# Patient Record
Sex: Female | Born: 1970 | Race: White | Hispanic: No | Marital: Married | Smoking: Former smoker
Health system: Southern US, Community
[De-identification: ages and names within clinical notes are randomized; demographics above are authoritative.]

## PROBLEM LIST (undated history)

## (undated) DIAGNOSIS — M1990 Unspecified osteoarthritis, unspecified site: Secondary | ICD-10-CM

## (undated) DIAGNOSIS — K449 Diaphragmatic hernia without obstruction or gangrene: Secondary | ICD-10-CM

## (undated) DIAGNOSIS — F411 Generalized anxiety disorder: Secondary | ICD-10-CM

## (undated) DIAGNOSIS — M069 Rheumatoid arthritis, unspecified: Secondary | ICD-10-CM

## (undated) DIAGNOSIS — R52 Pain, unspecified: Secondary | ICD-10-CM

## (undated) DIAGNOSIS — F329 Major depressive disorder, single episode, unspecified: Secondary | ICD-10-CM

## (undated) DIAGNOSIS — M545 Low back pain: Secondary | ICD-10-CM

## (undated) DIAGNOSIS — M542 Cervicalgia: Secondary | ICD-10-CM

## (undated) DIAGNOSIS — K219 Gastro-esophageal reflux disease without esophagitis: Secondary | ICD-10-CM

## (undated) DIAGNOSIS — F32A Depression, unspecified: Secondary | ICD-10-CM

## (undated) DIAGNOSIS — M85852 Other specified disorders of bone density and structure, left thigh: Secondary | ICD-10-CM

## (undated) DIAGNOSIS — G43909 Migraine, unspecified, not intractable, without status migrainosus: Secondary | ICD-10-CM

## (undated) DIAGNOSIS — M791 Myalgia, unspecified site: Secondary | ICD-10-CM

## (undated) DIAGNOSIS — S0500XA Injury of conjunctiva and corneal abrasion without foreign body, unspecified eye, initial encounter: Secondary | ICD-10-CM

## (undated) HISTORY — DX: Migraine, unspecified, not intractable, without status migrainosus: G43.909

## (undated) HISTORY — DX: Diaphragmatic hernia without obstruction or gangrene: K44.9

## (undated) HISTORY — DX: Gastro-esophageal reflux disease without esophagitis: K21.9

## (undated) HISTORY — DX: Major depressive disorder, single episode, unspecified: F32.9

## (undated) HISTORY — DX: Low back pain: M54.5

## (undated) HISTORY — DX: Generalized anxiety disorder: F41.1

## (undated) HISTORY — DX: Unspecified osteoarthritis, unspecified site: M19.90

## (undated) HISTORY — DX: Pain, unspecified: R52

## (undated) HISTORY — DX: Rheumatoid arthritis, unspecified: M06.9

## (undated) HISTORY — DX: Other specified disorders of bone density and structure, left thigh: M85.852

## (undated) HISTORY — DX: Cervicalgia: M54.2

## (undated) HISTORY — DX: Depression, unspecified: F32.A

## (undated) HISTORY — DX: Myalgia, unspecified site: M79.10

## (undated) HISTORY — DX: Injury of conjunctiva and corneal abrasion without foreign body, unspecified eye, initial encounter: S05.00XA

## (undated) HISTORY — PX: ABDOMINAL HYSTERECTOMY: SHX81

## (undated) HISTORY — PX: CHOLECYSTECTOMY: SHX55

---

## 2011-12-15 DIAGNOSIS — M059 Rheumatoid arthritis with rheumatoid factor, unspecified: Secondary | ICD-10-CM | POA: Insufficient documentation

## 2011-12-15 DIAGNOSIS — K219 Gastro-esophageal reflux disease without esophagitis: Secondary | ICD-10-CM

## 2011-12-15 DIAGNOSIS — M542 Cervicalgia: Secondary | ICD-10-CM

## 2011-12-15 DIAGNOSIS — F419 Anxiety disorder, unspecified: Secondary | ICD-10-CM | POA: Insufficient documentation

## 2011-12-15 DIAGNOSIS — IMO0001 Reserved for inherently not codable concepts without codable children: Secondary | ICD-10-CM | POA: Insufficient documentation

## 2011-12-15 DIAGNOSIS — F331 Major depressive disorder, recurrent, moderate: Secondary | ICD-10-CM | POA: Insufficient documentation

## 2011-12-15 DIAGNOSIS — M069 Rheumatoid arthritis, unspecified: Secondary | ICD-10-CM

## 2011-12-15 DIAGNOSIS — E039 Hypothyroidism, unspecified: Secondary | ICD-10-CM | POA: Insufficient documentation

## 2011-12-15 DIAGNOSIS — M791 Myalgia, unspecified site: Secondary | ICD-10-CM

## 2011-12-15 DIAGNOSIS — G43109 Migraine with aura, not intractable, without status migrainosus: Secondary | ICD-10-CM | POA: Insufficient documentation

## 2011-12-15 DIAGNOSIS — M545 Low back pain, unspecified: Secondary | ICD-10-CM

## 2011-12-15 DIAGNOSIS — F411 Generalized anxiety disorder: Secondary | ICD-10-CM

## 2011-12-15 HISTORY — DX: Low back pain, unspecified: M54.50

## 2011-12-15 HISTORY — DX: Rheumatoid arthritis, unspecified: M06.9

## 2011-12-15 HISTORY — DX: Cervicalgia: M54.2

## 2011-12-15 HISTORY — DX: Myalgia, unspecified site: M79.10

## 2011-12-15 HISTORY — DX: Generalized anxiety disorder: F41.1

## 2011-12-15 HISTORY — DX: Gastro-esophageal reflux disease without esophagitis: K21.9

## 2012-12-26 DIAGNOSIS — Z79899 Other long term (current) drug therapy: Secondary | ICD-10-CM | POA: Insufficient documentation

## 2012-12-27 DIAGNOSIS — R52 Pain, unspecified: Secondary | ICD-10-CM

## 2012-12-27 HISTORY — DX: Pain, unspecified: R52

## 2013-12-21 DIAGNOSIS — E039 Hypothyroidism, unspecified: Secondary | ICD-10-CM | POA: Insufficient documentation

## 2014-04-24 DIAGNOSIS — Z975 Presence of (intrauterine) contraceptive device: Secondary | ICD-10-CM | POA: Insufficient documentation

## 2014-12-31 ENCOUNTER — Ambulatory Visit: Payer: Commercial Managed Care - HMO | Attending: Pain Medicine | Admitting: Pain Medicine

## 2014-12-31 ENCOUNTER — Other Ambulatory Visit: Payer: Self-pay | Admitting: Pain Medicine

## 2014-12-31 ENCOUNTER — Encounter: Payer: Self-pay | Admitting: Pain Medicine

## 2014-12-31 ENCOUNTER — Other Ambulatory Visit
Admission: RE | Admit: 2014-12-31 | Discharge: 2014-12-31 | Disposition: A | Discharge status: Home / Self Care | Payer: Commercial Managed Care - HMO | Source: Ambulatory Visit | Attending: Pain Medicine | Admitting: Pain Medicine

## 2014-12-31 VITALS — BP 135/81 | HR 94 | Temp 98.4°F | Resp 18 | Ht 66.0 in | Wt 178.0 lb

## 2014-12-31 DIAGNOSIS — G894 Chronic pain syndrome: Secondary | ICD-10-CM | POA: Insufficient documentation

## 2014-12-31 DIAGNOSIS — M7918 Myalgia, other site: Secondary | ICD-10-CM

## 2014-12-31 DIAGNOSIS — F329 Major depressive disorder, single episode, unspecified: Secondary | ICD-10-CM | POA: Insufficient documentation

## 2014-12-31 DIAGNOSIS — M47816 Spondylosis without myelopathy or radiculopathy, lumbar region: Secondary | ICD-10-CM | POA: Insufficient documentation

## 2014-12-31 DIAGNOSIS — F1190 Opioid use, unspecified, uncomplicated: Secondary | ICD-10-CM | POA: Diagnosis not present

## 2014-12-31 DIAGNOSIS — Z79891 Long term (current) use of opiate analgesic: Secondary | ICD-10-CM | POA: Insufficient documentation

## 2014-12-31 DIAGNOSIS — M545 Low back pain, unspecified: Secondary | ICD-10-CM

## 2014-12-31 DIAGNOSIS — M069 Rheumatoid arthritis, unspecified: Secondary | ICD-10-CM

## 2014-12-31 DIAGNOSIS — Z9884 Bariatric surgery status: Secondary | ICD-10-CM | POA: Diagnosis not present

## 2014-12-31 DIAGNOSIS — M62838 Other muscle spasm: Secondary | ICD-10-CM | POA: Diagnosis not present

## 2014-12-31 DIAGNOSIS — K219 Gastro-esophageal reflux disease without esophagitis: Secondary | ICD-10-CM

## 2014-12-31 DIAGNOSIS — M961 Postlaminectomy syndrome, not elsewhere classified: Secondary | ICD-10-CM | POA: Insufficient documentation

## 2014-12-31 DIAGNOSIS — M5020 Other cervical disc displacement, unspecified cervical region: Secondary | ICD-10-CM | POA: Insufficient documentation

## 2014-12-31 DIAGNOSIS — M47812 Spondylosis without myelopathy or radiculopathy, cervical region: Secondary | ICD-10-CM | POA: Insufficient documentation

## 2014-12-31 DIAGNOSIS — M5136 Other intervertebral disc degeneration, lumbar region: Secondary | ICD-10-CM | POA: Insufficient documentation

## 2014-12-31 DIAGNOSIS — M4726 Other spondylosis with radiculopathy, lumbar region: Secondary | ICD-10-CM | POA: Diagnosis not present

## 2014-12-31 DIAGNOSIS — R51 Headache: Secondary | ICD-10-CM | POA: Insufficient documentation

## 2014-12-31 DIAGNOSIS — M25512 Pain in left shoulder: Secondary | ICD-10-CM

## 2014-12-31 DIAGNOSIS — E039 Hypothyroidism, unspecified: Secondary | ICD-10-CM | POA: Insufficient documentation

## 2014-12-31 DIAGNOSIS — M546 Pain in thoracic spine: Secondary | ICD-10-CM | POA: Insufficient documentation

## 2014-12-31 DIAGNOSIS — G9619 Other disorders of meninges, not elsewhere classified: Secondary | ICD-10-CM | POA: Insufficient documentation

## 2014-12-31 DIAGNOSIS — G8929 Other chronic pain: Secondary | ICD-10-CM | POA: Diagnosis not present

## 2014-12-31 DIAGNOSIS — F32A Depression, unspecified: Secondary | ICD-10-CM

## 2014-12-31 DIAGNOSIS — M5126 Other intervertebral disc displacement, lumbar region: Secondary | ICD-10-CM | POA: Diagnosis not present

## 2014-12-31 DIAGNOSIS — F1120 Opioid dependence, uncomplicated: Secondary | ICD-10-CM

## 2014-12-31 DIAGNOSIS — M549 Dorsalgia, unspecified: Secondary | ICD-10-CM | POA: Insufficient documentation

## 2014-12-31 DIAGNOSIS — M25511 Pain in right shoulder: Secondary | ICD-10-CM

## 2014-12-31 DIAGNOSIS — M5430 Sciatica, unspecified side: Secondary | ICD-10-CM | POA: Diagnosis not present

## 2014-12-31 DIAGNOSIS — Z5181 Encounter for therapeutic drug level monitoring: Secondary | ICD-10-CM | POA: Insufficient documentation

## 2014-12-31 DIAGNOSIS — M4804 Spinal stenosis, thoracic region: Secondary | ICD-10-CM

## 2014-12-31 DIAGNOSIS — M5412 Radiculopathy, cervical region: Secondary | ICD-10-CM

## 2014-12-31 DIAGNOSIS — M25561 Pain in right knee: Secondary | ICD-10-CM

## 2014-12-31 DIAGNOSIS — M25562 Pain in left knee: Secondary | ICD-10-CM

## 2014-12-31 DIAGNOSIS — M5124 Other intervertebral disc displacement, thoracic region: Secondary | ICD-10-CM | POA: Insufficient documentation

## 2014-12-31 DIAGNOSIS — G4486 Cervicogenic headache: Secondary | ICD-10-CM

## 2014-12-31 DIAGNOSIS — F331 Major depressive disorder, recurrent, moderate: Secondary | ICD-10-CM

## 2014-12-31 DIAGNOSIS — M5030 Other cervical disc degeneration, unspecified cervical region: Secondary | ICD-10-CM | POA: Insufficient documentation

## 2014-12-31 DIAGNOSIS — Z8619 Personal history of other infectious and parasitic diseases: Secondary | ICD-10-CM | POA: Insufficient documentation

## 2014-12-31 DIAGNOSIS — Z9889 Other specified postprocedural states: Secondary | ICD-10-CM

## 2014-12-31 DIAGNOSIS — M5441 Lumbago with sciatica, right side: Secondary | ICD-10-CM

## 2014-12-31 DIAGNOSIS — Z8659 Personal history of other mental and behavioral disorders: Secondary | ICD-10-CM | POA: Insufficient documentation

## 2014-12-31 DIAGNOSIS — M542 Cervicalgia: Secondary | ICD-10-CM

## 2014-12-31 DIAGNOSIS — F411 Generalized anxiety disorder: Secondary | ICD-10-CM | POA: Diagnosis not present

## 2014-12-31 DIAGNOSIS — M5416 Radiculopathy, lumbar region: Secondary | ICD-10-CM

## 2014-12-31 DIAGNOSIS — G96198 Other disorders of meninges, not elsewhere classified: Secondary | ICD-10-CM | POA: Insufficient documentation

## 2014-12-31 DIAGNOSIS — M51369 Other intervertebral disc degeneration, lumbar region without mention of lumbar back pain or lower extremity pain: Secondary | ICD-10-CM

## 2014-12-31 DIAGNOSIS — M5442 Lumbago with sciatica, left side: Secondary | ICD-10-CM

## 2014-12-31 DIAGNOSIS — M79643 Pain in unspecified hand: Secondary | ICD-10-CM

## 2014-12-31 DIAGNOSIS — Z9289 Personal history of other medical treatment: Secondary | ICD-10-CM

## 2014-12-31 LAB — URINE DRUG SCREEN, QUALITATIVE (ARMC ONLY)
Amphetamines, Ur Screen: NOT DETECTED
BARBITURATES, UR SCREEN: NOT DETECTED
BENZODIAZEPINE, UR SCRN: POSITIVE — AB
Cannabinoid 50 Ng, Ur ~~LOC~~: NOT DETECTED
Cocaine Metabolite,Ur ~~LOC~~: NOT DETECTED
MDMA (Ecstasy)Ur Screen: NOT DETECTED
Methadone Scn, Ur: NOT DETECTED
OPIATE, UR SCREEN: NOT DETECTED
PHENCYCLIDINE (PCP) UR S: NOT DETECTED
Tricyclic, Ur Screen: POSITIVE — AB

## 2014-12-31 LAB — COMPREHENSIVE METABOLIC PANEL
ALBUMIN: 4.2 g/dL (ref 3.5–5.0)
ALK PHOS: 70 U/L (ref 38–126)
ALT: 44 U/L (ref 14–54)
ANION GAP: 7 (ref 5–15)
AST: 44 U/L — ABNORMAL HIGH (ref 15–41)
BILIRUBIN TOTAL: 0.9 mg/dL (ref 0.3–1.2)
BUN: 8 mg/dL (ref 6–20)
CALCIUM: 8.5 mg/dL — AB (ref 8.9–10.3)
CO2: 28 mmol/L (ref 22–32)
CREATININE: 0.52 mg/dL (ref 0.44–1.00)
Chloride: 105 mmol/L (ref 101–111)
GFR calc non Af Amer: >60 mL/min (ref >60)
GLUCOSE: 88 mg/dL (ref 65–99)
Potassium: 3 mmol/L — ABNORMAL LOW (ref 3.5–5.1)
SODIUM: 140 mmol/L (ref 135–145)
TOTAL PROTEIN: 7.1 g/dL (ref 6.5–8.1)

## 2014-12-31 LAB — C-REACTIVE PROTEIN: CRP: <0.5 mg/dL (ref <1.0)

## 2014-12-31 LAB — SEDIMENTATION RATE: Sed Rate: 5 mm/hr (ref 0–20)

## 2014-12-31 LAB — MAGNESIUM: MAGNESIUM: 2 mg/dL (ref 1.7–2.4)

## 2014-12-31 MED ORDER — OXYCODONE HCL 5 MG PO CAPS: 10.0000 mg | ORAL_CAPSULE | Freq: Four times a day (QID) | ORAL | Status: DC | PRN

## 2014-12-31 MED ORDER — OXYCODONE HCL 5 MG PO CAPS
10.0000 mg | ORAL_CAPSULE | Freq: Four times a day (QID) | ORAL | Status: DC | PRN
Start: 2014-12-31 — End: 2015-03-27

## 2014-12-31 MED ORDER — CYCLOBENZAPRINE HCL 10 MG PO TABS: 10.0000 mg | ORAL_TABLET | Freq: Three times a day (TID) | ORAL | Status: DC | PRN

## 2014-12-31 NOTE — Progress Notes (Signed)
Patient's Name: Holly Spears MRN: [id number] DOB: 1971-01-24 DOS: 12/31/2014  Primary Reason(s) for Visit: Encounter for Medication Management CC: Back Pain   HPI:   Holly Spears is a 44 y.o. year old, female patient, who returns today as an established patient. She has Chronic pain; Long term current use of opiate analgesic; Long term prescription opiate use; Opiate use; Encounter for therapeutic drug level monitoring; Opiate dependence (HCC); Encounter for chronic pain management; Muscle spasm; Myofascial pain; Acquired hypothyroidism; Adult hypothyroidism; Intracervical pessary; Moderate episode of recurrent major depressive disorder (HCC); Acute onset aura migraine; Rheumatoid arthritis (HCC); Musculoskeletal pain; Chronic pain syndrome; Chronic neck pain; Cervical spondylosis (C5-6 and C6-7); Cervical disc herniation (Left C6-7); Bulge of cervical disc without myelopathy (C5-6); Cervicogenic headache; Chronic cervical radicular pain (Bilateral) (R>L); Chronic pain of both shoulders (Bilateral) (R>L); Chronic low back pain; Failed back surgical syndrome; 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; Lumbar spondylosis (L3-4, L4-5, and L5-S1); Lumbar bulging disc (L3-4, L4-5, and L5-S1); Chronic lumbar radicular pain (Bilateral); Chronic knee pain (Bilateral); Chronic left-sided thoracic back pain; Thoracic disc herniation (Large, Left paracentral T10-11 disc herniation); Thoracic spinal stenosis (Severe, Left, T10-11 Lateral Recess Stenosis); Thoracic foraminal stenosis (Severe Left T10-11); History of bilateral carpal tunnel release (Bilateral); Chronic hand pain (Bilateral) (R>L); GERD (gastroesophageal reflux disease); History of psychiatric disorder; History of psychiatric care; Generalized anxiety disorder; Depression; History of shingles; History of gastric bypass; and History of Roux-en-Y gastric bypass on her problem list.. Her primarily concern today is the Back Pain     The patient returns to the clinic today  for medication management. We talked about using one 10 mg oxycodone instead of two 5 mg pills. She indicates that she try that before and for some reason the generic 10 mg pill does not work as well as to 5 mg generic pills. I thought about switching her to the brand name 10 mg pill, but she indicates that her insurance will not pay for it. Because of this will stay as is. One of the problems that she has has to do with her gastric bypass.  The patient describes her primary pain as being that of the lower back with the right side being worst on the left. Following this is the bilateral knee pain with the left being worse than the right. Finally she describes pain that goes down to her heel on the right side. She indicates that this last one was secondary to a nerve root block done at [facility] by a "trainee". She indicates that she experience a severe pain as he was injecting, which the patient had never experienced before with any of the other injections she had done.  Today's Pain Score: 6 , clinically she looks like a 2-3/10. Reported level of pain is incompatible with clinical obrservations. This may be secondary to a possible lack of understanding on how the pain scale works. Pain Type: Chronic pain Pain Location: Back Pain Orientation: Mid Pain Descriptors / Indicators: Constant, Aching Pain Frequency: Constant  Date of Last Visit: 09/11/14 Service Provided on Last Visit: Evaluation, Med Refill  Pharmacotherapy Review:   Side-effects or Adverse reactions: None reported. Effectiveness: Described as relatively effective, allowing for increase in activities of daily living (ADL). Onset of action: Within expected pharmacological parameters. Duration of action: Within normal limits for medication. Peak effect: Timing and results are as within normal expected parameters. [place] PMP: Compliant with practice rules and regulations. UDS Results:  Lab Results  Component Value Date   THCU NONE DETECTED  12/31/2014   PCPSCRNUR NONE DETECTED 12/31/2014   MDMA NONE DETECTED 12/31/2014   AMPHETMU NONE DETECTED 12/31/2014   METHADONE NONE DETECTED 12/31/2014    UDS Interpretation: Patient appears to be compliant with practice rules and regulations. Medication Assessment Form: Reviewed. Patient indicates being compliant with therapy Treatment compliance: Compliant. Substance Use Disorder (SUD) Risk Level: Low Pharmacologic Plan: Continue therapy as is.  Allergies: Holly Spears is allergic to bee venom; honey bee venom; penicillins; shellfish allergy; shellfish-derived products; sulfa antibiotics; erythromycin; sulfamethizole; zaleplon; codeine; erythromycin base; and trazodone.  Meds: The patient has a current medication list which includes the following prescription(s): bupropion, buspirone, citalopram, cyclobenzaprine, diazepam, hydroxychloroquine, nature-throid, oxycodone, oxycodone, oxycodone, and potassium chloride sa. Requested Prescriptions   Signed Prescriptions Disp Refills  . cyclobenzaprine (FLEXERIL) 10 MG tablet 90 tablet 2    Sig: Take 1 tablet (10 mg total) by mouth 3 (three) times daily as needed for muscle spasms.  Marland Kitchen oxycodone (OXY-IR) 5 MG capsule 240 capsule 0    Sig: Take 2 capsules (10 mg total) by mouth every 6 (six) hours as needed for pain.  Marland Kitchen oxycodone (OXY-IR) 5 MG capsule 240 capsule 0    Sig: Take 2 capsules (10 mg total) by mouth every 6 (six) hours as needed for pain.  Marland Kitchen oxycodone (OXY-IR) 5 MG capsule 240 capsule 0    Sig: Take 2 capsules (10 mg total) by mouth every 6 (six) hours as needed for pain.    ROS: Constitutional: Afebrile, no chills, well hydrated and well nourished Gastrointestinal: negative Musculoskeletal:negative Neurological: negative Behavioral/Psych: negative  PFSH: Medical:  Ms. Halberstam  has a past medical history of Depression; GERD (gastroesophageal reflux disease); Arthritis; Migraine; Hiatal hernia; Pain (12/27/2012); Rheumatoid  arthritis (HCC) (12/15/2011); Muscle ache (12/15/2011); Cervical pain (12/15/2011); LBP (low back pain) (12/15/2011); Acid reflux (12/15/2011); and Anxiety, generalized (12/15/2011). Family: family history includes Depression in her mother; Diabetes in her father; Hyperlipidemia in her mother; Hypertension in her father and mother. Surgical:  has past surgical history that includes Cholecystectomy. Tobacco:  reports that she has quit smoking. She has never used smokeless tobacco. Alcohol:  reports that she does not drink alcohol. Drug:  reports that she does not use illicit drugs.  Physical Exam: Vitals:  Today's Vitals   12/31/14 1109  BP: 135/81  Pulse: 94  Temp: 98.4 F (36.9 C)  TempSrc: Oral  Resp: 18  Height: 5\' 6"  (1.676 m)  Weight: 178 lb (80.74 kg)  SpO2: 100%  PainSc: 6   PainLoc: Back  Calculated BMI: Body mass index is 28.74 kg/(m^2). General appearance: alert, cooperative, appears stated age, no distress and slowed mentation Eyes: conjunctivae/corneas clear. PERRL, EOM's intact. Fundi benign. Lungs: No evidence respiratory distress, no audible rales or ronchi and no use of accessory muscles of respiration Neck: Decreased range of motion of the cervical spine. Back: Some decreased range of motion of the lumbar spine with discomfort and pain on hyperextension and rotation. Extremities: extremities normal, atraumatic, no cyanosis or edema Pulses: 2+ and symmetric Skin: Skin color, texture, turgor normal. No rashes or lesions Neurologic: Gait: Antalgic    Assessment: Encounter Diagnosis:  Primary Diagnosis: Chronic pain [G89.29]  Plan: Sherryll was seen today for back pain.  Diagnoses and all orders for this visit:  Chronic pain -     COMPLETE METABOLIC PANEL WITH GFR; Future -     C-reactive protein; Future -     Magnesium; Future -     Sedimentation rate; Future -  Vitamin D2,D3 Panel; Future -     oxycodone (OXY-IR) 5 MG capsule; Take 2 capsules (10 mg  total) by mouth every 6 (six) hours as needed for pain. -     oxycodone (OXY-IR) 5 MG capsule; Take 2 capsules (10 mg total) by mouth every 6 (six) hours as needed for pain. -     oxycodone (OXY-IR) 5 MG capsule; Take 2 capsules (10 mg total) by mouth every 6 (six) hours as needed for pain.  Long term current use of opiate analgesic -     Drugs of abuse screen w/o alc, rtn urine-sln; Future -     Drugs of abuse screen w/o alc, rtn urine-sln  Long term prescription opiate use  Opiate use  Encounter for therapeutic drug level monitoring  Uncomplicated opioid dependence (HCC)  Encounter for chronic pain management  Muscle spasm -     cyclobenzaprine (FLEXERIL) 10 MG tablet; Take 1 tablet (10 mg total) by mouth 3 (three) times daily as needed for muscle spasms.  Myofascial pain -     cyclobenzaprine (FLEXERIL) 10 MG tablet; Take 1 tablet (10 mg total) by mouth 3 (three) times daily as needed for muscle spasms.  Cervical pain  Low back pain, unspecified back pain laterality, with sciatica presence unspecified  Rheumatoid arthritis, involving unspecified site, unspecified rheumatoid factor presence (HCC)  Musculoskeletal pain  Chronic pain syndrome  Chronic neck pain  Cervical spondylosis  Cervical disc herniation (Left C6-7)  Bulge of cervical disc without myelopathy (C5-6)  Cervicogenic headache  Chronic cervical radicular pain (Bilateral) (R>L)  Chronic pain of both shoulders (Bilateral) (R>L)  Chronic low back pain  Failed back surgical syndrome  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  Osteoarthritis of spine with radiculopathy, lumbar region  Lumbar bulging disc (L3-4, L4-5, and L5-S1)  Chronic lumbar radicular pain (Bilateral)  Chronic knee pain (Bilateral)  Chronic left-sided thoracic back pain  Thoracic disc herniation (Large, Left paracentral T10-11 disc herniation)  Thoracic spinal stenosis (Severe, Left, T10-11 Lateral Recess Stenosis)  Thoracic foraminal  stenosis (Severe Left T10-11)  History of bilateral carpal tunnel release (Bilateral)  Chronic hand pain, unspecified laterality  Gastroesophageal reflux disease without esophagitis  History of psychiatric disorder  History of psychiatric care  Moderate episode of recurrent major depressive disorder (HCC)  Generalized anxiety disorder  Depression  History of shingles  History of gastric bypass  History of Roux-en-Y gastric bypass     There are no Patient Instructions on file for this visit. Medications discontinued today:  Medications Discontinued During This Encounter  Medication Reason  . cyclobenzaprine (FLEXERIL) 10 MG tablet Reorder  . oxyCODONE (OXY IR/ROXICODONE) 5 MG immediate release tablet Duplicate   Medications administered today:  Ms. Guerin had no medications administered during this visit.  Primary Care Physician: Charisse March, MD Location: [facility] Note by: Sydnee Levans Laban Emperor, M.D, DABA, DABAPM, DABPM, DABIPP, FIPP

## 2014-12-31 NOTE — Progress Notes (Signed)
Safety precautions to be maintained throughout the outpatient stay will include: orient to surroundings, keep bed in low position, maintain call bell within reach at all times, provide assistance with transfer out of bed and ambulation.  Oxycodone pill count = 57

## 2015-01-01 LAB — VITAMIN D 25 HYDROXY (VIT D DEFICIENCY, FRACTURES): VIT D 25 HYDROXY: 29.3 ng/mL — AB (ref 30.0–100.0)

## 2015-01-05 LAB — TOXASSURE SELECT 13 (MW), URINE: PDF: 0

## 2015-01-08 NOTE — Progress Notes (Signed)
Quick Note:  Normal levels of Vitamin D for our Lab are between 30 and 100 ng/mL. The results of this test indicate that this patient has low levels of Vitamin D, compatible with a deficiency (<20 ng/ml), and/or insufficiency (20-30 ng/ml). Common causes include: dietary insufficiency; inadequate sun exposure; inability to absorb vitamin D from the intestines; or inability to process it due to kidney or liver disease. Associated complications may include hypocalcemia, hypophosphatemia, and reduced bone density. In addition, it is associated with fatigue, weakness, bone pain, joint pain, and muscle pain. The patient may benefit from taking over-the-counter Vitamin D3 supplements. I recommend 2,000 IU once a day, preferably with a Calcium supplement. ______ 

## 2015-02-07 DIAGNOSIS — J301 Allergic rhinitis due to pollen: Secondary | ICD-10-CM | POA: Diagnosis not present

## 2015-02-07 DIAGNOSIS — J3081 Allergic rhinitis due to animal (cat) (dog) hair and dander: Secondary | ICD-10-CM | POA: Diagnosis not present

## 2015-02-07 DIAGNOSIS — J302 Other seasonal allergic rhinitis: Secondary | ICD-10-CM | POA: Diagnosis not present

## 2015-02-14 DIAGNOSIS — J302 Other seasonal allergic rhinitis: Secondary | ICD-10-CM | POA: Diagnosis not present

## 2015-02-14 DIAGNOSIS — J301 Allergic rhinitis due to pollen: Secondary | ICD-10-CM | POA: Diagnosis not present

## 2015-02-14 DIAGNOSIS — J3081 Allergic rhinitis due to animal (cat) (dog) hair and dander: Secondary | ICD-10-CM | POA: Diagnosis not present

## 2015-02-20 DIAGNOSIS — J3081 Allergic rhinitis due to animal (cat) (dog) hair and dander: Secondary | ICD-10-CM | POA: Diagnosis not present

## 2015-02-20 DIAGNOSIS — J302 Other seasonal allergic rhinitis: Secondary | ICD-10-CM | POA: Diagnosis not present

## 2015-02-20 DIAGNOSIS — J301 Allergic rhinitis due to pollen: Secondary | ICD-10-CM | POA: Diagnosis not present

## 2015-02-26 DIAGNOSIS — J301 Allergic rhinitis due to pollen: Secondary | ICD-10-CM | POA: Diagnosis not present

## 2015-02-26 DIAGNOSIS — J3081 Allergic rhinitis due to animal (cat) (dog) hair and dander: Secondary | ICD-10-CM | POA: Diagnosis not present

## 2015-02-26 DIAGNOSIS — J302 Other seasonal allergic rhinitis: Secondary | ICD-10-CM | POA: Diagnosis not present

## 2015-03-06 DIAGNOSIS — M549 Dorsalgia, unspecified: Secondary | ICD-10-CM

## 2015-03-06 DIAGNOSIS — M62830 Muscle spasm of back: Secondary | ICD-10-CM | POA: Insufficient documentation

## 2015-03-06 DIAGNOSIS — M0609 Rheumatoid arthritis without rheumatoid factor, multiple sites: Secondary | ICD-10-CM | POA: Diagnosis not present

## 2015-03-06 DIAGNOSIS — J302 Other seasonal allergic rhinitis: Secondary | ICD-10-CM | POA: Diagnosis not present

## 2015-03-06 DIAGNOSIS — G8929 Other chronic pain: Secondary | ICD-10-CM

## 2015-03-06 DIAGNOSIS — J301 Allergic rhinitis due to pollen: Secondary | ICD-10-CM | POA: Diagnosis not present

## 2015-03-06 DIAGNOSIS — J3081 Allergic rhinitis due to animal (cat) (dog) hair and dander: Secondary | ICD-10-CM | POA: Diagnosis not present

## 2015-03-06 DIAGNOSIS — M5414 Radiculopathy, thoracic region: Secondary | ICD-10-CM | POA: Diagnosis not present

## 2015-03-12 DIAGNOSIS — J302 Other seasonal allergic rhinitis: Secondary | ICD-10-CM | POA: Diagnosis not present

## 2015-03-12 DIAGNOSIS — J301 Allergic rhinitis due to pollen: Secondary | ICD-10-CM | POA: Diagnosis not present

## 2015-03-12 DIAGNOSIS — J3081 Allergic rhinitis due to animal (cat) (dog) hair and dander: Secondary | ICD-10-CM | POA: Diagnosis not present

## 2015-03-27 ENCOUNTER — Encounter (INDEPENDENT_AMBULATORY_CARE_PROVIDER_SITE_OTHER): Payer: Self-pay

## 2015-03-27 ENCOUNTER — Ambulatory Visit: Payer: Commercial Managed Care - HMO | Attending: Pain Medicine | Admitting: Pain Medicine

## 2015-03-27 ENCOUNTER — Encounter: Payer: Self-pay | Admitting: Pain Medicine

## 2015-03-27 VITALS — BP 135/81 | HR 86 | Temp 98.6°F | Resp 18 | Ht 66.0 in | Wt 185.0 lb

## 2015-03-27 DIAGNOSIS — J302 Other seasonal allergic rhinitis: Secondary | ICD-10-CM | POA: Diagnosis not present

## 2015-03-27 DIAGNOSIS — M25511 Pain in right shoulder: Secondary | ICD-10-CM | POA: Insufficient documentation

## 2015-03-27 DIAGNOSIS — M25531 Pain in right wrist: Secondary | ICD-10-CM | POA: Diagnosis not present

## 2015-03-27 DIAGNOSIS — Z87891 Personal history of nicotine dependence: Secondary | ICD-10-CM | POA: Diagnosis not present

## 2015-03-27 DIAGNOSIS — M5126 Other intervertebral disc displacement, lumbar region: Secondary | ICD-10-CM | POA: Insufficient documentation

## 2015-03-27 DIAGNOSIS — M7918 Myalgia, other site: Secondary | ICD-10-CM

## 2015-03-27 DIAGNOSIS — M25532 Pain in left wrist: Secondary | ICD-10-CM | POA: Diagnosis not present

## 2015-03-27 DIAGNOSIS — F411 Generalized anxiety disorder: Secondary | ICD-10-CM | POA: Diagnosis not present

## 2015-03-27 DIAGNOSIS — M50222 Other cervical disc displacement at C5-C6 level: Secondary | ICD-10-CM | POA: Diagnosis not present

## 2015-03-27 DIAGNOSIS — Z9049 Acquired absence of other specified parts of digestive tract: Secondary | ICD-10-CM | POA: Insufficient documentation

## 2015-03-27 DIAGNOSIS — M791 Myalgia: Secondary | ICD-10-CM | POA: Insufficient documentation

## 2015-03-27 DIAGNOSIS — G43909 Migraine, unspecified, not intractable, without status migrainosus: Secondary | ICD-10-CM | POA: Insufficient documentation

## 2015-03-27 DIAGNOSIS — M47896 Other spondylosis, lumbar region: Secondary | ICD-10-CM | POA: Diagnosis not present

## 2015-03-27 DIAGNOSIS — M25512 Pain in left shoulder: Secondary | ICD-10-CM | POA: Insufficient documentation

## 2015-03-27 DIAGNOSIS — M25561 Pain in right knee: Secondary | ICD-10-CM | POA: Diagnosis not present

## 2015-03-27 DIAGNOSIS — Z79891 Long term (current) use of opiate analgesic: Secondary | ICD-10-CM | POA: Diagnosis not present

## 2015-03-27 DIAGNOSIS — M25562 Pain in left knee: Secondary | ICD-10-CM | POA: Diagnosis not present

## 2015-03-27 DIAGNOSIS — M4804 Spinal stenosis, thoracic region: Secondary | ICD-10-CM | POA: Diagnosis not present

## 2015-03-27 DIAGNOSIS — M47892 Other spondylosis, cervical region: Secondary | ICD-10-CM | POA: Insufficient documentation

## 2015-03-27 DIAGNOSIS — M069 Rheumatoid arthritis, unspecified: Secondary | ICD-10-CM | POA: Insufficient documentation

## 2015-03-27 DIAGNOSIS — G8929 Other chronic pain: Secondary | ICD-10-CM

## 2015-03-27 DIAGNOSIS — E876 Hypokalemia: Secondary | ICD-10-CM

## 2015-03-27 DIAGNOSIS — J301 Allergic rhinitis due to pollen: Secondary | ICD-10-CM | POA: Diagnosis not present

## 2015-03-27 DIAGNOSIS — E039 Hypothyroidism, unspecified: Secondary | ICD-10-CM | POA: Insufficient documentation

## 2015-03-27 DIAGNOSIS — M545 Low back pain, unspecified: Secondary | ICD-10-CM

## 2015-03-27 DIAGNOSIS — K219 Gastro-esophageal reflux disease without esophagitis: Secondary | ICD-10-CM | POA: Insufficient documentation

## 2015-03-27 DIAGNOSIS — Z9884 Bariatric surgery status: Secondary | ICD-10-CM | POA: Diagnosis not present

## 2015-03-27 DIAGNOSIS — M549 Dorsalgia, unspecified: Secondary | ICD-10-CM | POA: Diagnosis present

## 2015-03-27 DIAGNOSIS — F329 Major depressive disorder, single episode, unspecified: Secondary | ICD-10-CM | POA: Diagnosis not present

## 2015-03-27 DIAGNOSIS — M25549 Pain in joints of unspecified hand: Secondary | ICD-10-CM | POA: Diagnosis present

## 2015-03-27 DIAGNOSIS — F1190 Opioid use, unspecified, uncomplicated: Secondary | ICD-10-CM

## 2015-03-27 DIAGNOSIS — Z5181 Encounter for therapeutic drug level monitoring: Secondary | ICD-10-CM

## 2015-03-27 DIAGNOSIS — M62838 Other muscle spasm: Secondary | ICD-10-CM

## 2015-03-27 DIAGNOSIS — J3081 Allergic rhinitis due to animal (cat) (dog) hair and dander: Secondary | ICD-10-CM | POA: Diagnosis not present

## 2015-03-27 LAB — COMPREHENSIVE METABOLIC PANEL
ALBUMIN: 4.3 g/dL (ref 3.5–5.0)
ALK PHOS: 78 U/L (ref 38–126)
ALT: 26 U/L (ref 14–54)
AST: 26 U/L (ref 15–41)
Anion gap: 9 (ref 5–15)
BILIRUBIN TOTAL: 0.7 mg/dL (ref 0.3–1.2)
BUN: 7 mg/dL (ref 6–20)
CALCIUM: 8.7 mg/dL — AB (ref 8.9–10.3)
CO2: 26 mmol/L (ref 22–32)
CREATININE: 0.58 mg/dL (ref 0.44–1.00)
Chloride: 104 mmol/L (ref 101–111)
GFR calc Af Amer: >60 mL/min (ref >60)
GFR calc non Af Amer: >60 mL/min (ref >60)
GLUCOSE: 93 mg/dL (ref 65–99)
Potassium: 3.1 mmol/L — ABNORMAL LOW (ref 3.5–5.1)
SODIUM: 139 mmol/L (ref 135–145)
Total Protein: 7 g/dL (ref 6.5–8.1)

## 2015-03-27 MED ORDER — OXYCODONE HCL 5 MG PO TABS: 10.0000 mg | ORAL_TABLET | Freq: Four times a day (QID) | ORAL | Status: DC | PRN

## 2015-03-27 MED ORDER — CYCLOBENZAPRINE HCL 10 MG PO TABS: 10.0000 mg | ORAL_TABLET | Freq: Three times a day (TID) | ORAL | Status: DC | PRN

## 2015-03-27 NOTE — Progress Notes (Signed)
Patient's Name: Holly Spears MRN: [id number] DOB: 1970-10-01 DOS: 03/27/2015  Primary Reason(s) for Visit: Encounter for Medication Management CC: Back Pain and Hand Pain   HPI  Holly Spears is a 45 y.o. year old, female patient, who returns today as an established patient. She has Chronic pain; Long term current use of opiate analgesic; Long term prescription opiate use; Opiate use (30 MME/Day); Encounter for therapeutic drug level monitoring; Opiate dependence (HCC); Encounter for chronic pain management; Muscle spasm; Myofascial pain; Acquired hypothyroidism; Adult hypothyroidism; Intracervical pessary; Moderate episode of recurrent major depressive disorder (HCC); Acute onset aura migraine; Rheumatoid arthritis (HCC); Musculoskeletal pain; Chronic pain syndrome; Chronic neck pain; Cervical spondylosis (C5-6 and C6-7); Cervical disc herniation (C6-7) (Left); Bulge of cervical disc without myelopathy (C5-6); Cervicogenic headache; Chronic cervical radicular pain (Bilateral) (R>L); Chronic pain of both shoulders (Bilateral) (R>L); Chronic low back pain (Location of Primary Source of Pain) (Bilateral) (R>L); Failed back surgical syndrome; 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; Lumbar spondylosis (L3-4, L4-5, and L5-S1); Lumbar bulging disc (L3-4, L4-5, and L5-S1); Chronic lumbar radicular pain (Bilateral) (R>L); Chronic knee pain (Location of Secondary source of pain) (Bilateral) (R>L); Chronic upper back pain (Left); Thoracic disc herniation (Large, Left paracentral T10-11 disc herniation); Thoracic spinal stenosis (Severe, Left, T10-11 Lateral Recess Stenosis); Thoracic foraminal stenosis (Severe Left T10-11); History of bilateral carpal tunnel release (Bilateral); Chronic hand pain (Bilateral) (R>L); GERD (gastroesophageal reflux disease); History of psychiatric disorder; History of psychiatric care; Generalized anxiety disorder; Depression; History of shingles; History of gastric bypass; History of Roux-en-Y gastric  bypass; and Hypokalemia on her problem list.. Her primarily concern today is the Back Pain and Hand Pain   The patient comes in today clinics today for pharmacological management of her chronic pain. Today the patient has requested some information for the surgeons to manage her post operative pain. The patient indicates that her primary pain is in the mid to lower back around the T10-12 area with the right side being worse than the left. Her secondary pain is described to be that of her knees with the right being worst on the left. Following that is the pain in her fingers, hands, and wrists from the rheumatoid arthritis, this time the left being worst on the right. Her fourth is described to be the right ankle where she had a fracture in 1994.  Reported Pain Score: 7 , clinically she looks like a 2/10. Reported level is inconsistent with clinical obrservations. Pain Type: Chronic pain Pain Location: Back Pain Orientation: Mid (hands) Pain Descriptors / Indicators: Stabbing Pain Frequency: Constant  Date of Last Visit: 12/31/14 Service Provided on Last Visit: Med Refill  Controlled Substance Pharmacotherapy Assessment  Analgesic: Oxycodone IR 5 mg 1 tablet by mouth every 6 hours (20 mg/day) MME/day: 30 mg/day Pharmacokinetics: Onset of action (Liberation/Absorption): Within expected pharmacological parameters. (15-20 minutes) Time to Peak effect (Distribution): Timing and results are as within normal expected parameters. (90-120 minutes) Duration of action (Metabolism/Excretion): Within normal limits for medication. (4-6 hours) Pharmacodynamics: Analgesic Effect: 75% Activity Facilitation: Medication(s) allow patient to sit, stand, walk, and do the basic ADLs Perceived Effectiveness: Described as relatively effective, allowing for increase in activities of daily living (ADL) Side-effects or Adverse reactions: None reported Monitoring: Holly Spears: Compliant with practice rules and  regulations UDS Results/interpretation: The patient's last UDS was done on 12/31/2014 and he was rated as abnormal since it did have some undeclared benzodiazepines present. Other than that, her opioid medications were as expected. Medication Assessment Form: Reviewed. Patient indicates  being compliant with therapy Treatment compliance: Compliant Risk Assessment: Substance Use Disorder (SUD) Risk Level: Low Opioid Risk Tool (ORT) Score: Total Score: 1 Depression Scale Score:    Pharmacologic Plan: Continue therapy as is  Lab Work: Illicit Drugs Lab Results  Component Value Date   THCU NONE DETECTED 12/31/2014   PCPSCRNUR NONE DETECTED 12/31/2014   MDMA NONE DETECTED 12/31/2014   AMPHETMU NONE DETECTED 12/31/2014   METHADONE NONE DETECTED 12/31/2014    Inflammation Markers Lab Results  Component Value Date   ESRSEDRATE 5 12/31/2014   CRP <0.5 12/31/2014    Renal Function Lab Results  Component Value Date   BUN 7 03/27/2015   CREATININE 0.58 03/27/2015   GFRAA >60 03/27/2015   GFRNONAA >60 03/27/2015    Hepatic Function Lab Results  Component Value Date   AST 26 03/27/2015   ALT 26 03/27/2015   ALBUMIN 4.3 03/27/2015    Electrolytes Lab Results  Component Value Date   NA 139 03/27/2015   K 3.1* 03/27/2015   CL 104 03/27/2015   CALCIUM 8.7* 03/27/2015   MG 2.0 12/31/2014    Allergies  Holly Spears is allergic to bee venom; honey bee venom; penicillins; shellfish allergy; shellfish-derived products; sulfa antibiotics; erythromycin; sulfamethizole; zaleplon; codeine; erythromycin base; and trazodone.  Meds  The patient has a current medication list which includes the following prescription(s): biotin, bupropion, buspirone, vitamin d3, citalopram, cyclobenzaprine, diazepam, epinephrine, fluconazole, hydroxychloroquine, levonorgestrel, multi-vitamins, nature-throid, omeprazole, ranitidine, oxycodone, oxycodone, and oxycodone.  Current Outpatient Prescriptions on  File Prior to Visit  Medication Sig  . buPROPion (WELLBUTRIN) 100 MG tablet Take 100 mg by mouth 2 (two) times daily. 2 tabs 2 times daily  . busPIRone (BUSPAR) 5 MG tablet Take 5 mg by mouth daily.  . citalopram (CELEXA) 20 MG tablet Take 20 mg by mouth daily.  . diazepam (VALIUM) 5 MG tablet Take 5 mg by mouth 2 (two) times daily.  . hydroxychloroquine (PLAQUENIL) 200 MG tablet Take 200 mg by mouth 2 (two) times daily.  Marland Kitchen NATURE-THROID 48.75 MG TABS Take 48.75 mg by mouth daily.   No current facility-administered medications on file prior to visit.    ROS  Constitutional: Afebrile, no chills, well hydrated and well nourished Gastrointestinal: negative Musculoskeletal:negative Neurological: negative Behavioral/Psych: negative  PFSH  Medical:  Ms. Boerema  has a past medical history of Depression; GERD (gastroesophageal reflux disease); Arthritis; Migraine; Hiatal hernia; Pain (12/27/2012); Rheumatoid arthritis (HCC) (12/15/2011); Muscle ache (12/15/2011); Cervical pain (12/15/2011); LBP (low back pain) (12/15/2011); Acid reflux (12/15/2011); and Anxiety, generalized (12/15/2011). Family: family history includes Depression in her mother; Diabetes in her father; Hyperlipidemia in her mother; Hypertension in her father and mother. Surgical:  has past surgical history that includes Cholecystectomy. Tobacco:  reports that she has quit smoking. She has never used smokeless tobacco. Alcohol:  reports that she does not drink alcohol. Drug:  reports that she does not use illicit drugs.  Physical Exam  Vitals:  Today's Vitals   03/27/15 1101 03/27/15 1102  BP:  135/81  Pulse: 86   Temp: 98.6 F (37 C)   Resp: 18   Height: 5\' 6"  (1.676 m)   Weight: 185 lb (83.915 kg)   SpO2: 100%   PainSc: 7  7   PainLoc: Back     Calculated BMI: Body mass index is 29.87 kg/(m^2).  General appearance: alert, cooperative, appears stated age and no distress Eyes: PERLA Respiratory: No evidence  respiratory distress, no audible rales or ronchi and no  use of accessory muscles of respiration  Cervical Spine Inspection: Normal anatomy Alignment: Symetrical ROM: Adequate  Upper Extremities Inspection: No gross anomalies detected ROM: Adequate Sensory: Normal Motor: Unremarkable  Thoracic Spine Inspection: No gross anomalies detected Alignment: Symetrical ROM: Decreased  Lumbar Spine Inspection: No gross anomalies detected Alignment: Symetrical ROM: Decreased  Gait: WNL  Lower Extremities Inspection: No gross anomalies detected ROM: Adequate Sensory:  Normal Motor: Unremarkable  Assessment & Plan  Primary Diagnosis & Pertinent Problem List: The primary encounter diagnosis was Hypokalemia. Diagnoses of Chronic pain, Encounter for therapeutic drug level monitoring, Long term current use of opiate analgesic, Chronic low back pain, Muscle spasm, Myofascial pain, and Opiate use (30 MME/Day) were also pertinent to this visit.  Visit Diagnosis: 1. Hypokalemia   2. Chronic pain   3. Encounter for therapeutic drug level monitoring   4. Long term current use of opiate analgesic   5. Chronic low back pain   6. Muscle spasm   7. Myofascial pain   8. Opiate use (30 MME/Day)     Problem-specific Plan(s): No problem-specific assessment & plan notes found for this encounter.   Plan of Care  Pharmacotherapy (Medications Ordered): Meds ordered this encounter  Medications  . cyclobenzaprine (FLEXERIL) 10 MG tablet    Sig: Take 1 tablet (10 mg total) by mouth 3 (three) times daily as needed for muscle spasms.    Dispense:  90 tablet    Refill:  2    Do not place this medication, or any other prescription from our practice, on "Automatic Refill". Patient may have prescription filled one day early if pharmacy is closed on scheduled refill date.  Marland Kitchen oxyCODONE (OXY IR/ROXICODONE) 5 MG immediate release tablet    Sig: Take 2 tablets (10 mg total) by mouth every 6 (six) hours as  needed for moderate pain or severe pain.    Dispense:  240 tablet    Refill:  0    Do not place this medication, or any other prescription from our practice, on "Automatic Refill". Patient may have prescription filled one day early if pharmacy is closed on scheduled refill date. Do not fill until: 03/31/15 To last until: 04/30/15  . oxyCODONE (OXY IR/ROXICODONE) 5 MG immediate release tablet    Sig: Take 2 tablets (10 mg total) by mouth every 6 (six) hours as needed for moderate pain or severe pain.    Dispense:  240 tablet    Refill:  0    Do not place this medication, or any other prescription from our practice, on "Automatic Refill". Patient may have prescription filled one day early if pharmacy is closed on scheduled refill date. Do not fill until: 04/30/15 To last until: 05/30/15  . oxyCODONE (OXY IR/ROXICODONE) 5 MG immediate release tablet    Sig: Take 2 tablets (10 mg total) by mouth every 6 (six) hours as needed for moderate pain or severe pain.    Dispense:  240 tablet    Refill:  0    Do not place this medication, or any other prescription from our practice, on "Automatic Refill". Patient may have prescription filled one day early if pharmacy is closed on scheduled refill date. Do not fill until: 05/30/15 To last until: 06/29/15    [facility] & Procedure Ordered: Orders Placed This Encounter  Procedures  . ToxASSURE Select 13 (MW), Urine    Volume: 30 ml(s). Minimum 3 ml of urine is needed. Document temperature of fresh sample. Indications: Long term (current) use of opiate  analgesic (Z79.891)  . Comprehensive metabolic panel    Order Specific Question:  Has the patient fasted?    Answer:  No  . Vitamin B12    Indication: Bone Pain (M89.9)  . Vitamin D pnl(25-hydrxy+1,25-dihy)-bld    Imaging Ordered: None  Interventional Therapies: Scheduled: None at this time. PRN Procedures: Bilateral lumbar facet block under fluoroscopic guidance and IV sedation.    Referral(s)  or Consult(s): None at this time.  Medications administered during this visit: Ms. Festa had no medications administered during this visit.  Future Appointments Date Time Provider Department Center  06/19/2015 10:20 AM Delano Metz, MD [facility] None    Primary Care Physician: Charisse March, MD Location: [facility] Facility Note by: Sydnee Levans. Laban Emperor, M.D, DABA, DABAPM, DABPM, DABIPP, FIPP

## 2015-03-27 NOTE — Patient Instructions (Signed)
Instructed to get labwork drawn today at Pre admit testing. 

## 2015-03-27 NOTE — Progress Notes (Signed)
Safety precautions to be maintained throughout the outpatient stay will include: orient to surroundings, keep bed in low position, maintain call bell within reach at all times, provide assistance with transfer out of bed and ambulation. Oxycodone pill count # 37/240  Filled 03-01-15

## 2015-04-02 DIAGNOSIS — J3081 Allergic rhinitis due to animal (cat) (dog) hair and dander: Secondary | ICD-10-CM | POA: Diagnosis not present

## 2015-04-02 DIAGNOSIS — J302 Other seasonal allergic rhinitis: Secondary | ICD-10-CM | POA: Diagnosis not present

## 2015-04-02 DIAGNOSIS — J301 Allergic rhinitis due to pollen: Secondary | ICD-10-CM | POA: Diagnosis not present

## 2015-04-02 NOTE — Progress Notes (Signed)
Quick Note:   Potassium levels below 3.6 mmol/L are considered to be low. Levels (less than 2.5 mmol/L) can be life-threatening and requires urgent medical attention. Low potassium (hypokalemia) has many causes. The most common cause is excessive potassium loss in urine due to prescription water or fluid pills (diuretics). Vomiting or diarrhea or both can result in excessive potassium loss from the digestive tract. Causes of potassium loss leading to low potassium include: chronic kidney disease; diabetic ketoacidosis; diarrhea; excessive alcohol use; excessive laxative use; excessive sweating; folic acid deficiency; diuretics; primary aldosteronism; vomiting; and/or some antibiotic use.  Normal calcium levels are between 9.0 and 10.5 mg/dl. The most common cause of low total calcium is: Low blood protein levels, especially a low level of albumin, which can result from liver disease or malnutrition, both of which may result from alcoholism or other illnesses. Low albumin is also very common in people who are acutely ill. With low albumin, only the bound calcium is low. Ionized calcium remains normal, and calcium metabolism is being regulated appropriately. Some other causes of hypocalcemia include: Underactive parathyroid gland (hypoparathyroidism); Inherited resistance to the effects of parathyroid hormone; Extreme deficiency in dietary calcium; Decreased levels of vitamin D; Magnesium deficiency; Increased levels of phosphorus; Acute inflammation of the pancreas (pancreatitis); & Renal failure.  The patient may need Potassium replacement. ______

## 2015-04-04 LAB — TOXASSURE SELECT 13 (MW), URINE: PDF: 0

## 2015-04-10 DIAGNOSIS — J301 Allergic rhinitis due to pollen: Secondary | ICD-10-CM | POA: Diagnosis not present

## 2015-04-10 DIAGNOSIS — J302 Other seasonal allergic rhinitis: Secondary | ICD-10-CM | POA: Diagnosis not present

## 2015-04-10 DIAGNOSIS — J3081 Allergic rhinitis due to animal (cat) (dog) hair and dander: Secondary | ICD-10-CM | POA: Diagnosis not present

## 2015-04-12 DIAGNOSIS — Z1329 Encounter for screening for other suspected endocrine disorder: Secondary | ICD-10-CM | POA: Diagnosis not present

## 2015-04-12 DIAGNOSIS — Z8639 Personal history of other endocrine, nutritional and metabolic disease: Secondary | ICD-10-CM | POA: Diagnosis not present

## 2015-04-12 DIAGNOSIS — Z130 Encounter for screening for diseases of the blood and blood-forming organs and certain disorders involving the immune mechanism: Secondary | ICD-10-CM | POA: Diagnosis not present

## 2015-04-12 DIAGNOSIS — Z13220 Encounter for screening for lipoid disorders: Secondary | ICD-10-CM | POA: Diagnosis not present

## 2015-04-12 DIAGNOSIS — Z131 Encounter for screening for diabetes mellitus: Secondary | ICD-10-CM | POA: Diagnosis not present

## 2015-04-17 DIAGNOSIS — J301 Allergic rhinitis due to pollen: Secondary | ICD-10-CM | POA: Diagnosis not present

## 2015-04-17 DIAGNOSIS — J302 Other seasonal allergic rhinitis: Secondary | ICD-10-CM | POA: Diagnosis not present

## 2015-04-17 DIAGNOSIS — J3081 Allergic rhinitis due to animal (cat) (dog) hair and dander: Secondary | ICD-10-CM | POA: Diagnosis not present

## 2015-04-19 DIAGNOSIS — F329 Major depressive disorder, single episode, unspecified: Secondary | ICD-10-CM | POA: Diagnosis not present

## 2015-04-19 DIAGNOSIS — E039 Hypothyroidism, unspecified: Secondary | ICD-10-CM | POA: Diagnosis not present

## 2015-04-19 DIAGNOSIS — F419 Anxiety disorder, unspecified: Secondary | ICD-10-CM | POA: Diagnosis not present

## 2015-04-19 DIAGNOSIS — Z8669 Personal history of other diseases of the nervous system and sense organs: Secondary | ICD-10-CM | POA: Insufficient documentation

## 2015-04-19 DIAGNOSIS — Z0000 Encounter for general adult medical examination without abnormal findings: Secondary | ICD-10-CM | POA: Diagnosis not present

## 2015-04-23 DIAGNOSIS — J302 Other seasonal allergic rhinitis: Secondary | ICD-10-CM | POA: Diagnosis not present

## 2015-04-23 DIAGNOSIS — J301 Allergic rhinitis due to pollen: Secondary | ICD-10-CM | POA: Diagnosis not present

## 2015-04-23 DIAGNOSIS — J3081 Allergic rhinitis due to animal (cat) (dog) hair and dander: Secondary | ICD-10-CM | POA: Diagnosis not present

## 2015-04-26 ENCOUNTER — Other Ambulatory Visit: Payer: Self-pay

## 2015-04-30 DIAGNOSIS — J301 Allergic rhinitis due to pollen: Secondary | ICD-10-CM | POA: Diagnosis not present

## 2015-04-30 DIAGNOSIS — J3081 Allergic rhinitis due to animal (cat) (dog) hair and dander: Secondary | ICD-10-CM | POA: Diagnosis not present

## 2015-04-30 DIAGNOSIS — J302 Other seasonal allergic rhinitis: Secondary | ICD-10-CM | POA: Diagnosis not present

## 2015-05-07 DIAGNOSIS — J3081 Allergic rhinitis due to animal (cat) (dog) hair and dander: Secondary | ICD-10-CM | POA: Diagnosis not present

## 2015-05-07 DIAGNOSIS — J302 Other seasonal allergic rhinitis: Secondary | ICD-10-CM | POA: Diagnosis not present

## 2015-05-07 DIAGNOSIS — J301 Allergic rhinitis due to pollen: Secondary | ICD-10-CM | POA: Diagnosis not present

## 2015-05-15 DIAGNOSIS — J3081 Allergic rhinitis due to animal (cat) (dog) hair and dander: Secondary | ICD-10-CM | POA: Diagnosis not present

## 2015-05-15 DIAGNOSIS — J302 Other seasonal allergic rhinitis: Secondary | ICD-10-CM | POA: Diagnosis not present

## 2015-05-15 DIAGNOSIS — J301 Allergic rhinitis due to pollen: Secondary | ICD-10-CM | POA: Diagnosis not present

## 2015-05-20 DIAGNOSIS — J301 Allergic rhinitis due to pollen: Secondary | ICD-10-CM | POA: Diagnosis not present

## 2015-05-20 DIAGNOSIS — J3081 Allergic rhinitis due to animal (cat) (dog) hair and dander: Secondary | ICD-10-CM | POA: Diagnosis not present

## 2015-05-20 DIAGNOSIS — J302 Other seasonal allergic rhinitis: Secondary | ICD-10-CM | POA: Diagnosis not present

## 2015-05-28 DIAGNOSIS — M2550 Pain in unspecified joint: Secondary | ICD-10-CM | POA: Diagnosis not present

## 2015-05-28 DIAGNOSIS — J302 Other seasonal allergic rhinitis: Secondary | ICD-10-CM | POA: Diagnosis not present

## 2015-05-28 DIAGNOSIS — J301 Allergic rhinitis due to pollen: Secondary | ICD-10-CM | POA: Diagnosis not present

## 2015-05-28 DIAGNOSIS — J3081 Allergic rhinitis due to animal (cat) (dog) hair and dander: Secondary | ICD-10-CM | POA: Diagnosis not present

## 2015-06-05 DIAGNOSIS — M2550 Pain in unspecified joint: Secondary | ICD-10-CM | POA: Diagnosis not present

## 2015-06-05 DIAGNOSIS — J301 Allergic rhinitis due to pollen: Secondary | ICD-10-CM | POA: Diagnosis not present

## 2015-06-05 DIAGNOSIS — J3081 Allergic rhinitis due to animal (cat) (dog) hair and dander: Secondary | ICD-10-CM | POA: Diagnosis not present

## 2015-06-05 DIAGNOSIS — J302 Other seasonal allergic rhinitis: Secondary | ICD-10-CM | POA: Diagnosis not present

## 2015-06-12 DIAGNOSIS — J301 Allergic rhinitis due to pollen: Secondary | ICD-10-CM | POA: Diagnosis not present

## 2015-06-12 DIAGNOSIS — J302 Other seasonal allergic rhinitis: Secondary | ICD-10-CM | POA: Diagnosis not present

## 2015-06-12 DIAGNOSIS — J3081 Allergic rhinitis due to animal (cat) (dog) hair and dander: Secondary | ICD-10-CM | POA: Diagnosis not present

## 2015-06-18 DIAGNOSIS — J302 Other seasonal allergic rhinitis: Secondary | ICD-10-CM | POA: Diagnosis not present

## 2015-06-18 DIAGNOSIS — J3081 Allergic rhinitis due to animal (cat) (dog) hair and dander: Secondary | ICD-10-CM | POA: Diagnosis not present

## 2015-06-18 DIAGNOSIS — J301 Allergic rhinitis due to pollen: Secondary | ICD-10-CM | POA: Diagnosis not present

## 2015-06-19 ENCOUNTER — Encounter: Payer: Self-pay | Admitting: Pain Medicine

## 2015-06-19 ENCOUNTER — Ambulatory Visit: Payer: Commercial Managed Care - HMO | Attending: Pain Medicine | Admitting: Pain Medicine

## 2015-06-19 VITALS — BP 130/67 | HR 94 | Temp 98.4°F | Resp 16 | Ht 66.0 in | Wt 178.0 lb

## 2015-06-19 DIAGNOSIS — Z9884 Bariatric surgery status: Secondary | ICD-10-CM | POA: Insufficient documentation

## 2015-06-19 DIAGNOSIS — M79642 Pain in left hand: Secondary | ICD-10-CM | POA: Diagnosis not present

## 2015-06-19 DIAGNOSIS — M79641 Pain in right hand: Secondary | ICD-10-CM | POA: Diagnosis not present

## 2015-06-19 DIAGNOSIS — F419 Anxiety disorder, unspecified: Secondary | ICD-10-CM | POA: Insufficient documentation

## 2015-06-19 DIAGNOSIS — M549 Dorsalgia, unspecified: Secondary | ICD-10-CM | POA: Diagnosis present

## 2015-06-19 DIAGNOSIS — Z87891 Personal history of nicotine dependence: Secondary | ICD-10-CM | POA: Diagnosis not present

## 2015-06-19 DIAGNOSIS — G8929 Other chronic pain: Secondary | ICD-10-CM | POA: Diagnosis not present

## 2015-06-19 DIAGNOSIS — H579 Unspecified disorder of eye and adnexa: Secondary | ICD-10-CM | POA: Diagnosis not present

## 2015-06-19 DIAGNOSIS — M4804 Spinal stenosis, thoracic region: Secondary | ICD-10-CM | POA: Diagnosis not present

## 2015-06-19 DIAGNOSIS — M62838 Other muscle spasm: Secondary | ICD-10-CM | POA: Diagnosis not present

## 2015-06-19 DIAGNOSIS — G9619 Other disorders of meninges, not elsewhere classified: Secondary | ICD-10-CM | POA: Diagnosis not present

## 2015-06-19 DIAGNOSIS — Z5181 Encounter for therapeutic drug level monitoring: Secondary | ICD-10-CM | POA: Diagnosis not present

## 2015-06-19 DIAGNOSIS — K219 Gastro-esophageal reflux disease without esophagitis: Secondary | ICD-10-CM | POA: Diagnosis not present

## 2015-06-19 DIAGNOSIS — M47816 Spondylosis without myelopathy or radiculopathy, lumbar region: Secondary | ICD-10-CM | POA: Diagnosis not present

## 2015-06-19 DIAGNOSIS — E039 Hypothyroidism, unspecified: Secondary | ICD-10-CM | POA: Insufficient documentation

## 2015-06-19 DIAGNOSIS — M47812 Spondylosis without myelopathy or radiculopathy, cervical region: Secondary | ICD-10-CM | POA: Diagnosis not present

## 2015-06-19 DIAGNOSIS — G5603 Carpal tunnel syndrome, bilateral upper limbs: Secondary | ICD-10-CM | POA: Insufficient documentation

## 2015-06-19 DIAGNOSIS — M069 Rheumatoid arthritis, unspecified: Secondary | ICD-10-CM | POA: Insufficient documentation

## 2015-06-19 DIAGNOSIS — M25511 Pain in right shoulder: Secondary | ICD-10-CM | POA: Diagnosis not present

## 2015-06-19 DIAGNOSIS — R51 Headache: Secondary | ICD-10-CM | POA: Insufficient documentation

## 2015-06-19 DIAGNOSIS — M5416 Radiculopathy, lumbar region: Secondary | ICD-10-CM

## 2015-06-19 DIAGNOSIS — M5126 Other intervertebral disc displacement, lumbar region: Secondary | ICD-10-CM | POA: Insufficient documentation

## 2015-06-19 DIAGNOSIS — G43109 Migraine with aura, not intractable, without status migrainosus: Secondary | ICD-10-CM | POA: Diagnosis not present

## 2015-06-19 DIAGNOSIS — F329 Major depressive disorder, single episode, unspecified: Secondary | ICD-10-CM | POA: Diagnosis not present

## 2015-06-19 DIAGNOSIS — Z79891 Long term (current) use of opiate analgesic: Secondary | ICD-10-CM | POA: Insufficient documentation

## 2015-06-19 DIAGNOSIS — M5124 Other intervertebral disc displacement, thoracic region: Secondary | ICD-10-CM | POA: Insufficient documentation

## 2015-06-19 DIAGNOSIS — M25512 Pain in left shoulder: Secondary | ICD-10-CM | POA: Diagnosis not present

## 2015-06-19 DIAGNOSIS — M545 Low back pain: Secondary | ICD-10-CM | POA: Insufficient documentation

## 2015-06-19 DIAGNOSIS — M50222 Other cervical disc displacement at C5-C6 level: Secondary | ICD-10-CM | POA: Diagnosis not present

## 2015-06-19 DIAGNOSIS — M542 Cervicalgia: Secondary | ICD-10-CM | POA: Diagnosis not present

## 2015-06-19 DIAGNOSIS — F1190 Opioid use, unspecified, uncomplicated: Secondary | ICD-10-CM

## 2015-06-19 MED ORDER — OXYCODONE HCL 5 MG PO TABS: 10.0000 mg | ORAL_TABLET | Freq: Four times a day (QID) | ORAL | Status: DC | PRN

## 2015-06-19 NOTE — Progress Notes (Signed)
Safety precautions to be maintained throughout the outpatient stay will include: orient to surroundings, keep bed in low position, maintain call bell within reach at all times, provide assistance with transfer out of bed and ambulation. Oxycodone pill count # 79/240  Filled 05-30-15

## 2015-06-19 NOTE — Progress Notes (Signed)
Patient's Name: Holly Spears  Patient type: Established  MRN: [id number]  Service setting: Ambulatory outpatient  DOB: 10/30/1970  Location: [facility]  DOS: 06/19/2015  Primary Care Physician: Charisse March, MD  Note by: Sydnee Levans. Laban Emperor, M.D, DABA, DABAPM, DABPM, Olga Coaster, FIPP  Referring Physician: Charisse March, MD  Specialty: Board-Certified Interventional Pain Management  Last Visit to Pain Management: 03/27/2015   Primary Reason(s) for Visit: Encounter for prescription drug management (Level of risk: moderate) CC: Back Pain and Headache   HPI  Ms. Bushway is a 45 y.o. year old, female patient, who returns today as an established patient. She has Chronic pain; Long term current use of opiate analgesic; Long term prescription opiate use; Opiate use (60 MME/Day); Encounter for therapeutic drug level monitoring; Opiate dependence (HCC); Encounter for chronic pain management; Muscle spasm; Myofascial pain; Acquired hypothyroidism; Adult hypothyroidism; Intracervical pessary; Moderate episode of recurrent major depressive disorder (HCC); Acute onset aura migraine; Rheumatoid arthritis (HCC); Musculoskeletal pain; Chronic pain syndrome; Chronic neck pain; Cervical spondylosis (C5-6 and C6-7); Cervical disc herniation (C6-7) (Left); Bulge of cervical disc without myelopathy (C5-6); Cervicogenic headache; Chronic cervical radicular pain (Bilateral) (R>L); Chronic pain of both shoulders (Bilateral) (R>L); Chronic low back pain (Location of Primary Source of Pain) (Bilateral) (R>L); Failed back surgical syndrome; 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; Lumbar spondylosis (L3-4, L4-5, and L5-S1); Lumbar bulging disc (L3-4, L4-5, and L5-S1); Chronic lumbar radicular pain (Bilateral) (R>L); Chronic knee pain (Location of Secondary source of pain) (Bilateral) (R>L); Chronic upper back pain (Left); Thoracic disc herniation (Large, Left paracentral T10-11 disc herniation); Thoracic spinal  stenosis (Severe, Left, 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 Her primarily concern today is the Back Pain and Headache   Pain Assessment: Self-Reported Pain Score: 5  Reported level is compatible with observation Pain Type: Chronic pain Pain Location: Back Pain Orientation: Lower Pain Descriptors / Indicators: Sharp, Constant Pain Frequency: Constant  The patient comes into the clinics today for pharmacological management of her chronic pain. I last saw this patient on 03/27/2015. The patient  reports that she does not use illicit drugs. Her body mass index is 28.74 kg/(m^2).  Date of Last Visit: 03/27/15 Service Provided on Last Visit: Med Refill  Controlled Substance Pharmacotherapy Assessment & REMS (Risk Evaluation and Mitigation Strategy)  Analgesic: Oxycodone IR 5 mg 2 tablets every 6 hours (40 mg/day of oxycodone per day) Pill Count: Oxycodone pill count # 79/240 Filled 05-30-15 MME/day: 60 mg/day Date of Last Visit: 03/27/15 Pharmacokinetics: Onset of action (Liberation/Absorption): Within expected pharmacological parameters Time to Peak effect (Distribution): Timing and results are as within normal expected parameters Duration of action (Metabolism/Excretion): Within normal limits for medication Pharmacodynamics: Analgesic Effect: More than 50% Activity Facilitation: Medication(s) allow patient to sit, stand, walk, and do the basic ADLs Perceived Effectiveness: Described as relatively effective, allowing for increase in activities of daily living  (ADL) Side-effects or Adverse reactions: None reported Monitoring: [place] PMP: Online review of the past 74-month period conducted. Compliant with practice rules and regulations UDS Results/interpretation: The patient's last UDS was done on 03/27/2015 and it came back within normal limits with no unexpected results. Medication Assessment Form: Reviewed. Patient indicates being compliant with  therapy Treatment compliance: Compliant Risk Assessment: Aberrant Behavior: None observed today Substance Use Disorder (SUD) Risk Level: Moderate-to-high Risk of opioid abuse or dependence: 0.7-3.0% with doses ? 36 MME/day and 6.1-26% with doses ? 120 MME/day. Opioid Risk Tool (ORT) Score: Total Score: 4 Moderate Risk for SUD (Score between 4-7) Depression Scale Score: PHQ-2: PHQ-2 Total Score: 0 No depression (0) PHQ-9: PHQ-9 Total Score: 0 No depression (0-4)  Pharmacologic Plan: No change in therapy, at this time  Laboratory Chemistry  Inflammation Markers Lab Results  Component Value Date   ESRSEDRATE 5 12/31/2014   CRP <0.5 12/31/2014    Renal Function Lab Results  Component Value Date   BUN 7 03/27/2015   CREATININE 0.58 03/27/2015   GFRAA >60 03/27/2015   GFRNONAA >60 03/27/2015    Hepatic Function Lab Results  Component Value Date   AST 26 03/27/2015   ALT 26 03/27/2015   ALBUMIN 4.3 03/27/2015    Electrolytes Lab Results  Component Value Date   NA 139 03/27/2015   K 3.1* 03/27/2015   CL 104 03/27/2015   CALCIUM 8.7* 03/27/2015   MG 2.0 12/31/2014    Pain Modulating Vitamins Lab Results  Component Value Date   VD25OH 29.3* 12/31/2014    Coagulation Parameters No results found for: INR, LABPROT  Note: I personally reviewed the above data. Results made available to patient.  Recent Diagnostic Imaging  No results found.  Meds  The patient has a current medication list which includes the following prescription(s): biotin, bupropion, buspirone, vitamin d3,  citalopram, cyclobenzaprine, diazepam, epinephrine, fluconazole, multi-vitamins, nature-throid, oxycodone, oxycodone, oxycodone, ranitidine, hydroxychloroquine, and levonorgestrel.  Current Outpatient Prescriptions on File Prior to Visit  Medication Sig  . Biotin 10 MG CAPS Take 1 tablet by mouth daily.   Marland Kitchen buPROPion (WELLBUTRIN) 100 MG tablet Take 100 mg by mouth 2 (two) times daily. 2 tabs 2 times daily  . busPIRone (BUSPAR) 5 MG tablet Take 5 mg by mouth daily.  . Cholecalciferol (VITAMIN D3) 1000 units CAPS Take by mouth.  . citalopram (CELEXA) 20 MG tablet Take 20 mg by mouth daily.  . cyclobenzaprine (FLEXERIL) 10 MG tablet Take 1 tablet (10 mg total) by mouth 3 (three) times daily as needed for muscle spasms.  . diazepam (VALIUM) 5 MG tablet Take 5 mg by mouth 2 (two) times daily.  Marland Kitchen EPINEPHrine (AUVI-Q) 0.3 mg/0.3 mL IJ SOAJ injection   . fluconazole (DIFLUCAN) 150 MG tablet Take 150 mg by mouth.  . Multiple Vitamin (MULTI-VITAMINS) TABS Take by mouth.  Marland Kitchen NATURE-THROID 48.75 MG TABS Take 48.75 mg by mouth daily.  . ranitidine (ZANTAC) 150 MG tablet Take 150 mg by mouth 2 (two) times daily.   . hydroxychloroquine (PLAQUENIL) 200 MG tablet Take 200 mg by mouth 2 (two) times daily.  Marland Kitchen levonorgestrel (MIRENA) 20 MCG/24HR IUD by Intrauterine route.   No current facility-administered medications on file prior to visit.    ROS  Constitutional: Denies any fever or chills Gastrointestinal: No reported hemesis, hematochezia, vomiting, or acute GI distress Musculoskeletal: Denies any acute onset joint swelling, redness, loss of ROM, or weakness Neurological: No reported episodes of acute onset apraxia, aphasia, dysarthria, agnosia, amnesia, paralysis, loss of coordination, or loss of consciousness  Allergies  Ms. Hildenbrand is allergic to bee venom; honey bee venom; penicillins; shellfish allergy; shellfish-derived products; sulfa antibiotics; erythromycin; sulfamethizole; zaleplon; codeine;  erythromycin base; and trazodone.  PFSH  Medical:  Ms. Feeback  has a past medical history of Depression; GERD (gastroesophageal  reflux disease); Arthritis; Migraine; Hiatal hernia; Pain (12/27/2012); Rheumatoid arthritis (HCC) (12/15/2011); Muscle ache (12/15/2011); Cervical pain (12/15/2011); LBP (low back pain) (12/15/2011); Acid reflux (12/15/2011); and Anxiety, generalized (12/15/2011). Family: family history includes Depression in her mother; Diabetes in her father; Hyperlipidemia in her mother; Hypertension in her father and mother. Surgical:  has past surgical history that includes Cholecystectomy. Tobacco:  reports that she has quit smoking. She has never used smokeless tobacco. Alcohol:  reports that she does not drink alcohol. Drug:  reports that she does not use illicit drugs.  Constitutional Exam  Vitals: Blood pressure 130/67, pulse 94, temperature 98.4 F (36.9 C), resp. rate 16, height 5\' 6"  (1.676 m), weight 178 lb (80.74 kg), SpO2 100 %. General appearance: Well nourished, well developed, and well hydrated. In no acute distress Calculated BMI/Body habitus: Body mass index is 28.74 kg/(m^2). (25-29.9 kg/m2) Overweight - 20% higher incidence of chronic pain Psych/Mental status: Alert and oriented x 3 (person, place, & time) Eyes: PERLA Respiratory: No evidence of acute respiratory distress  Cervical Spine Exam  Inspection: No masses, redness, or swelling Alignment: Symmetrical ROM: Functional: ROM is within functional limits [facility]) Stability: No instability detected Muscle strength & Tone: Functionally intact Sensory: Unimpaired Palpation: No complaints of tenderness  Upper Extremity (UE) Exam    Side: Right upper extremity  Side: Left upper extremity  Inspection: No masses, redness, swelling, or asymmetry  Inspection: No masses, redness, swelling, or asymmetry  ROM:  ROM:  Functional: ROM is within functional limits [facility])  Functional: ROM is within functional limits  [facility])  Muscle strength & Tone: Functionally intact  Muscle strength & Tone: Functionally intact  Sensory: Unimpaired  Sensory: Unimpaired  Palpation: Non-contributory  Palpation: Non-contributory   Thoracic Spine Exam  Inspection: No masses, redness, or swelling Alignment: Symmetrical ROM: Functional: ROM is within functional limits [facility]) Stability: No instability detected Sensory: Unimpaired Muscle strength & Tone: Functionally intact Palpation: No complaints of tenderness  Lumbar Spine Exam  Inspection: No masses, redness, or swelling Alignment: Symmetrical ROM: Functional: ROM is within functional limits [facility]) Stability: No instability detected Muscle strength & Tone: Functionally intact Sensory: Unimpaired Palpation: No complaints of tenderness Provocative Tests: Lumbar Hyperextension and rotation test: deferred Patrick's Maneuver: deferred  Gait & Posture Assessment  Ambulation: Unassisted Gait: Unaffected Posture: WNL  Lower Extremity Exam    Side: Right lower extremity  Side: Left lower extremity  Inspection: No masses, redness, swelling, or asymmetry ROM:  Inspection: No masses, redness, swelling, or asymmetry ROM:  Functional: ROM is within functional limits [facility])  Functional: ROM is within functional limits [facility])  Muscle strength & Tone: Functionally intact  Muscle strength & Tone: Functionally intact  Sensory: Unimpaired  Sensory: Unimpaired  Palpation: Non-contributory  Palpation: Non-contributory   Assessment & Plan  Primary Diagnosis & Pertinent Problem List: The primary encounter diagnosis was Chronic neck pain. Diagnoses of Chronic low back pain (Location of Primary Source of Pain) (Bilateral) (R>L), Chronic lumbar radicular pain (Bilateral) (R>L), Encounter for therapeutic drug level monitoring, Long term current use of opiate analgesic, Chronic pain, and Opiate use (30 MME/Day) were also pertinent to this visit.  Visit Diagnosis: 1. Chronic neck pain    2. Chronic low back pain (Location of Primary Source of Pain) (Bilateral) (R>L)   3. Chronic lumbar radicular pain (Bilateral) (R>L)   4. Encounter for therapeutic drug level monitoring   5. Long term current use of opiate analgesic   6. Chronic pain   7. Opiate use (30 MME/Day)  Problems updated and reviewed during this visit: Problem  Opiate use (60 MME/Day)  H/O Eye Disorder    Problem-specific Plan(s): No problem-specific assessment & plan notes found for this encounter.  No new assessment & plan notes have been filed under this hospital service since the last note was generated. Service: Pain Management   Plan of Care   Problem List Items Addressed This Visit      High   Chronic low back pain (Location of Primary Source of Pain) (Bilateral) (R>L) (Chronic)   Relevant Medications   oxyCODONE (OXY IR/ROXICODONE) 5 MG immediate release tablet   oxyCODONE (OXY IR/ROXICODONE) 5 MG immediate release tablet   oxyCODONE (OXY IR/ROXICODONE) 5 MG immediate release tablet   Chronic lumbar radicular pain (Bilateral) (R>L) (Chronic)   Relevant Orders   NCV with EMG(electromyography)   Chronic neck pain - Primary (Chronic)   Relevant Medications   oxyCODONE (OXY IR/ROXICODONE) 5 MG immediate release tablet   oxyCODONE (OXY IR/ROXICODONE) 5 MG immediate release tablet   oxyCODONE (OXY IR/ROXICODONE) 5 MG immediate release tablet   Chronic pain (Chronic)   Relevant Medications   oxyCODONE (OXY IR/ROXICODONE) 5 MG immediate release tablet   oxyCODONE (OXY IR/ROXICODONE) 5 MG immediate release tablet   oxyCODONE (OXY IR/ROXICODONE) 5 MG immediate release tablet     Medium   Encounter for therapeutic drug level monitoring   Long term current use of opiate analgesic (Chronic)   Relevant Orders   ToxASSURE Select 13 (MW), Urine   Opiate use (60 MME/Day) (Chronic)       Pharmacotherapy (Medications Ordered): Meds ordered this encounter  Medications  . oxyCODONE (OXY  IR/ROXICODONE) 5 MG immediate release tablet    Sig: Take 2 tablets (10 mg total) by mouth every 6 (six) hours as needed for severe pain.    Dispense:  240 tablet    Refill:  0    Do not place this medication, or any other prescription from our practice, on "Automatic Refill". Patient may have prescription filled one day early if pharmacy is closed on scheduled refill date. Do not fill until: 06/29/15 To last until: 07/29/15  . oxyCODONE (OXY IR/ROXICODONE) 5 MG immediate release tablet    Sig: Take 2 tablets (10 mg total) by mouth every 6 (six) hours as needed for severe pain.    Dispense:  240 tablet    Refill:  0    Do not place this medication, or any other prescription from our practice, on "Automatic Refill". Patient may have prescription filled one day early if pharmacy is closed on scheduled refill date. Do not fill until: 07/29/15 To last until: 08/28/15  . oxyCODONE (OXY IR/ROXICODONE) 5 MG immediate release tablet    Sig: Take 2 tablets (10 mg total) by mouth every 6 (six) hours as needed for severe pain.    Dispense:  240 tablet    Refill:  0    Do not place this medication, or any other prescription from our practice, on "Automatic Refill". Patient may have prescription filled one day early if pharmacy is closed on scheduled refill date. Do not fill until: 08/28/15 To last until: 09/27/15    [facility] & Procedure Ordered: Orders Placed This Encounter  Procedures  . ToxASSURE Select 13 (MW), Urine  . NCV with EMG(electromyography)    Imaging Ordered: None  Interventional Therapies: Scheduled:  None at this time.    Considering:  Diagnostic lumbar facet block versus lumbar epidural steroid injection.    PRN Procedures:  None at this time.    Referral(s) or Consult(s): None at this time.  New Prescriptions   No medications on file    Medications administered during this visit: Ms. Greenawalt had no medications administered during this visit.  Requested PM  Follow-up: Return in about 3 months (around 09/04/2015) for Medication Management, (3-Mo).  Future Appointments Date Time Provider Department Center  09/04/2015 2:00 PM Delano Metz, MD [facility] None    Primary Care Physician: Charisse March, MD Location: [facility] Note by: Sydnee Levans. Laban Emperor, M.D, DABA, DABAPM, DABPM, DABIPP, FIPP  Pain Score Disclaimer: We use the NRS-11 scale. This is a self-reported, subjective measurement of pain severity with only modest accuracy. It is used primarily to identify changes within a particular patient. It must be understood that outpatient pain scales are significantly less accurate that those used for research, where they can be applied under ideal controlled circumstances with minimal exposure to variables. In reality, the score is likely to be a combination of pain intensity and pain affect, where pain affect describes the degree of emotional arousal or changes in action readiness caused by the sensory experience of pain. Factors such as social and work situation, setting, emotional state, anxiety levels, expectation, and prior pain experience may influence pain perception and show large inter-individual differences that may also be affected by time variables.  Patient instructions provided during this appointment: There are no Patient Instructions on file for this visit.

## 2015-06-25 ENCOUNTER — Other Ambulatory Visit: Payer: Self-pay

## 2015-06-25 DIAGNOSIS — J302 Other seasonal allergic rhinitis: Secondary | ICD-10-CM | POA: Diagnosis not present

## 2015-06-25 DIAGNOSIS — J301 Allergic rhinitis due to pollen: Secondary | ICD-10-CM | POA: Diagnosis not present

## 2015-06-25 DIAGNOSIS — J3081 Allergic rhinitis due to animal (cat) (dog) hair and dander: Secondary | ICD-10-CM | POA: Diagnosis not present

## 2015-06-27 LAB — TOXASSURE SELECT 13 (MW), URINE

## 2015-06-28 ENCOUNTER — Telehealth: Payer: Self-pay | Admitting: *Deleted

## 2015-06-28 NOTE — Telephone Encounter (Signed)
sw pt made her aware that Dr. Laban Emperor changed his schedule. pt agreed to come in on 09/05/15@ 10:20am...td

## 2015-07-02 DIAGNOSIS — J301 Allergic rhinitis due to pollen: Secondary | ICD-10-CM | POA: Diagnosis not present

## 2015-07-02 DIAGNOSIS — J302 Other seasonal allergic rhinitis: Secondary | ICD-10-CM | POA: Diagnosis not present

## 2015-07-02 DIAGNOSIS — J3081 Allergic rhinitis due to animal (cat) (dog) hair and dander: Secondary | ICD-10-CM | POA: Diagnosis not present

## 2015-07-09 DIAGNOSIS — J301 Allergic rhinitis due to pollen: Secondary | ICD-10-CM | POA: Diagnosis not present

## 2015-07-09 DIAGNOSIS — J302 Other seasonal allergic rhinitis: Secondary | ICD-10-CM | POA: Diagnosis not present

## 2015-07-09 DIAGNOSIS — J3081 Allergic rhinitis due to animal (cat) (dog) hair and dander: Secondary | ICD-10-CM | POA: Diagnosis not present

## 2015-07-17 DIAGNOSIS — J302 Other seasonal allergic rhinitis: Secondary | ICD-10-CM | POA: Diagnosis not present

## 2015-07-17 DIAGNOSIS — J301 Allergic rhinitis due to pollen: Secondary | ICD-10-CM | POA: Diagnosis not present

## 2015-07-17 DIAGNOSIS — J3081 Allergic rhinitis due to animal (cat) (dog) hair and dander: Secondary | ICD-10-CM | POA: Diagnosis not present

## 2015-07-24 DIAGNOSIS — J301 Allergic rhinitis due to pollen: Secondary | ICD-10-CM | POA: Diagnosis not present

## 2015-07-24 DIAGNOSIS — J302 Other seasonal allergic rhinitis: Secondary | ICD-10-CM | POA: Diagnosis not present

## 2015-07-24 DIAGNOSIS — J3081 Allergic rhinitis due to animal (cat) (dog) hair and dander: Secondary | ICD-10-CM | POA: Diagnosis not present

## 2015-08-07 DIAGNOSIS — J301 Allergic rhinitis due to pollen: Secondary | ICD-10-CM | POA: Diagnosis not present

## 2015-08-07 DIAGNOSIS — J302 Other seasonal allergic rhinitis: Secondary | ICD-10-CM | POA: Diagnosis not present

## 2015-08-07 DIAGNOSIS — J3081 Allergic rhinitis due to animal (cat) (dog) hair and dander: Secondary | ICD-10-CM | POA: Diagnosis not present

## 2015-08-14 DIAGNOSIS — J3081 Allergic rhinitis due to animal (cat) (dog) hair and dander: Secondary | ICD-10-CM | POA: Diagnosis not present

## 2015-08-14 DIAGNOSIS — J301 Allergic rhinitis due to pollen: Secondary | ICD-10-CM | POA: Diagnosis not present

## 2015-08-14 DIAGNOSIS — J302 Other seasonal allergic rhinitis: Secondary | ICD-10-CM | POA: Diagnosis not present

## 2015-08-20 DIAGNOSIS — J302 Other seasonal allergic rhinitis: Secondary | ICD-10-CM | POA: Diagnosis not present

## 2015-08-20 DIAGNOSIS — J301 Allergic rhinitis due to pollen: Secondary | ICD-10-CM | POA: Diagnosis not present

## 2015-08-20 DIAGNOSIS — J3081 Allergic rhinitis due to animal (cat) (dog) hair and dander: Secondary | ICD-10-CM | POA: Diagnosis not present

## 2015-08-28 DIAGNOSIS — J302 Other seasonal allergic rhinitis: Secondary | ICD-10-CM | POA: Diagnosis not present

## 2015-08-28 DIAGNOSIS — J301 Allergic rhinitis due to pollen: Secondary | ICD-10-CM | POA: Diagnosis not present

## 2015-08-28 DIAGNOSIS — J3081 Allergic rhinitis due to animal (cat) (dog) hair and dander: Secondary | ICD-10-CM | POA: Diagnosis not present

## 2015-09-03 DIAGNOSIS — M0609 Rheumatoid arthritis without rheumatoid factor, multiple sites: Secondary | ICD-10-CM | POA: Diagnosis not present

## 2015-09-03 DIAGNOSIS — J3081 Allergic rhinitis due to animal (cat) (dog) hair and dander: Secondary | ICD-10-CM | POA: Diagnosis not present

## 2015-09-03 DIAGNOSIS — J302 Other seasonal allergic rhinitis: Secondary | ICD-10-CM | POA: Diagnosis not present

## 2015-09-03 DIAGNOSIS — W57XXXA Bitten or stung by nonvenomous insect and other nonvenomous arthropods, initial encounter: Secondary | ICD-10-CM | POA: Insufficient documentation

## 2015-09-03 DIAGNOSIS — M62830 Muscle spasm of back: Secondary | ICD-10-CM | POA: Diagnosis not present

## 2015-09-03 DIAGNOSIS — S0096XA Insect bite (nonvenomous) of unspecified part of head, initial encounter: Secondary | ICD-10-CM | POA: Diagnosis not present

## 2015-09-03 DIAGNOSIS — J301 Allergic rhinitis due to pollen: Secondary | ICD-10-CM | POA: Diagnosis not present

## 2015-09-04 ENCOUNTER — Encounter: Payer: Medicare Other | Admitting: Pain Medicine

## 2015-09-05 ENCOUNTER — Ambulatory Visit: Payer: Commercial Managed Care - HMO | Attending: Pain Medicine | Admitting: Pain Medicine

## 2015-09-05 ENCOUNTER — Encounter: Payer: Self-pay | Admitting: Pain Medicine

## 2015-09-05 ENCOUNTER — Encounter (INDEPENDENT_AMBULATORY_CARE_PROVIDER_SITE_OTHER): Payer: Self-pay

## 2015-09-05 VITALS — BP 121/82 | HR 100 | Temp 98.6°F | Resp 18 | Ht 66.0 in | Wt 175.0 lb

## 2015-09-05 DIAGNOSIS — K219 Gastro-esophageal reflux disease without esophagitis: Secondary | ICD-10-CM | POA: Diagnosis not present

## 2015-09-05 DIAGNOSIS — M069 Rheumatoid arthritis, unspecified: Secondary | ICD-10-CM | POA: Insufficient documentation

## 2015-09-05 DIAGNOSIS — H579 Unspecified disorder of eye and adnexa: Secondary | ICD-10-CM | POA: Diagnosis not present

## 2015-09-05 DIAGNOSIS — M5134 Other intervertebral disc degeneration, thoracic region: Secondary | ICD-10-CM | POA: Diagnosis not present

## 2015-09-05 DIAGNOSIS — M5416 Radiculopathy, lumbar region: Secondary | ICD-10-CM

## 2015-09-05 DIAGNOSIS — M50222 Other cervical disc displacement at C5-C6 level: Secondary | ICD-10-CM | POA: Insufficient documentation

## 2015-09-05 DIAGNOSIS — M25561 Pain in right knee: Secondary | ICD-10-CM | POA: Insufficient documentation

## 2015-09-05 DIAGNOSIS — M791 Myalgia: Secondary | ICD-10-CM | POA: Diagnosis not present

## 2015-09-05 DIAGNOSIS — M542 Cervicalgia: Secondary | ICD-10-CM | POA: Insufficient documentation

## 2015-09-05 DIAGNOSIS — G9619 Other disorders of meninges, not elsewhere classified: Secondary | ICD-10-CM | POA: Diagnosis not present

## 2015-09-05 DIAGNOSIS — Z7189 Other specified counseling: Secondary | ICD-10-CM

## 2015-09-05 DIAGNOSIS — Z79899 Other long term (current) drug therapy: Secondary | ICD-10-CM | POA: Insufficient documentation

## 2015-09-05 DIAGNOSIS — M5126 Other intervertebral disc displacement, lumbar region: Secondary | ICD-10-CM | POA: Insufficient documentation

## 2015-09-05 DIAGNOSIS — M47812 Spondylosis without myelopathy or radiculopathy, cervical region: Secondary | ICD-10-CM | POA: Diagnosis not present

## 2015-09-05 DIAGNOSIS — M25562 Pain in left knee: Secondary | ICD-10-CM | POA: Diagnosis not present

## 2015-09-05 DIAGNOSIS — Z87891 Personal history of nicotine dependence: Secondary | ICD-10-CM | POA: Insufficient documentation

## 2015-09-05 DIAGNOSIS — Z5181 Encounter for therapeutic drug level monitoring: Secondary | ICD-10-CM

## 2015-09-05 DIAGNOSIS — Z79891 Long term (current) use of opiate analgesic: Secondary | ICD-10-CM | POA: Diagnosis not present

## 2015-09-05 DIAGNOSIS — E876 Hypokalemia: Secondary | ICD-10-CM | POA: Diagnosis not present

## 2015-09-05 DIAGNOSIS — M25512 Pain in left shoulder: Secondary | ICD-10-CM | POA: Diagnosis not present

## 2015-09-05 DIAGNOSIS — Z8619 Personal history of other infectious and parasitic diseases: Secondary | ICD-10-CM | POA: Diagnosis not present

## 2015-09-05 DIAGNOSIS — F338 Other recurrent depressive disorders: Secondary | ICD-10-CM | POA: Diagnosis not present

## 2015-09-05 DIAGNOSIS — M25511 Pain in right shoulder: Secondary | ICD-10-CM | POA: Diagnosis not present

## 2015-09-05 DIAGNOSIS — F1190 Opioid use, unspecified, uncomplicated: Secondary | ICD-10-CM

## 2015-09-05 DIAGNOSIS — M545 Low back pain: Secondary | ICD-10-CM | POA: Insufficient documentation

## 2015-09-05 DIAGNOSIS — G43909 Migraine, unspecified, not intractable, without status migrainosus: Secondary | ICD-10-CM | POA: Insufficient documentation

## 2015-09-05 DIAGNOSIS — M47896 Other spondylosis, lumbar region: Secondary | ICD-10-CM | POA: Insufficient documentation

## 2015-09-05 DIAGNOSIS — F411 Generalized anxiety disorder: Secondary | ICD-10-CM | POA: Diagnosis not present

## 2015-09-05 DIAGNOSIS — M62838 Other muscle spasm: Secondary | ICD-10-CM | POA: Diagnosis not present

## 2015-09-05 DIAGNOSIS — E039 Hypothyroidism, unspecified: Secondary | ICD-10-CM | POA: Diagnosis not present

## 2015-09-05 DIAGNOSIS — Z9884 Bariatric surgery status: Secondary | ICD-10-CM | POA: Insufficient documentation

## 2015-09-05 DIAGNOSIS — G8929 Other chronic pain: Secondary | ICD-10-CM | POA: Diagnosis not present

## 2015-09-05 MED ORDER — OXYCODONE HCL 5 MG PO TABS: 10.0000 mg | ORAL_TABLET | Freq: Four times a day (QID) | ORAL | 0 refills | Status: DC | PRN

## 2015-09-05 MED ORDER — CYCLOBENZAPRINE HCL 10 MG PO TABS: 10.0000 mg | ORAL_TABLET | Freq: Three times a day (TID) | ORAL | 2 refills | Status: DC | PRN

## 2015-09-05 NOTE — Progress Notes (Signed)
Patient's Name: Holly Spears  Patient type: Established  MRN: [id number]  Service setting: Ambulatory outpatient  DOB: 01-29-1971  Location: [facility]  DOS: 09/05/2015  Primary Care Physician: Charisse March, MD  Note by: Sydnee Levans. Laban Emperor, M.D  Referring Physician: Charisse March, MD  Specialty: Interventional Pain Management  Last Visit to Pain Management: 06/28/2015   Primary Reason(s) for Visit: Encounter for prescription drug management (Level of risk: moderate) CC: Back Pain (lower)   HPI  Holly Spears is a 45 y.o. year old, female patient, who returns today as an established patient. She has Chronic pain; Long term current use of opiate analgesic; Long term prescription opiate use; Opiate use (60 MME/Day); Encounter for therapeutic drug level monitoring; Opiate dependence (HCC); Encounter for chronic pain management; Muscle spasm; Myofascial pain; Acquired hypothyroidism; Adult hypothyroidism; Intracervical pessary; Moderate episode of recurrent major depressive disorder (HCC); Acute onset aura migraine; Rheumatoid arthritis (HCC); Musculoskeletal pain; Chronic pain syndrome; Chronic neck pain; Cervical spondylosis (C5-6 and C6-7); Cervical disc herniation (C6-7) (Left); Bulge of cervical disc without myelopathy (C5-6); Cervicogenic headache; Chronic cervical radicular pain (Bilateral) (R>L); Chronic pain of both shoulders (Bilateral) (R>L); Chronic low back pain (Location of Primary Source of Pain) (Bilateral) (R>L); Failed back surgical syndrome; 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; Lumbar spondylosis (L3-4, L4-5, and L5-S1); Lumbar bulging disc (L3-4, L4-5, and L5-S1); Chronic lumbar radicular pain (Bilateral) (R>L); Chronic knee pain (Location of Secondary source of pain) (Bilateral) (R>L); Chronic upper back pain (Left); Thoracic disc herniation (Large, Left paracentral T10-11 disc herniation); Thoracic spinal stenosis (Severe, Left, T10-11 Lateral Recess Stenosis); Thoracic  foraminal stenosis (Severe Left T10-11); History of bilateral carpal tunnel release (Bilateral); Chronic hand pain (Bilateral) (R>L); GERD (gastroesophageal reflux disease); History of psychiatric disorder; History of psychiatric care; Generalized anxiety disorder; Depression; History of shingles; History of gastric bypass; History of Roux-en-Y gastric bypass; Hypokalemia; H/O eye disorder; and Long term prescription benzodiazepine use on her problem list.. Her primarily concern today is the Back Pain (lower)   Pain Assessment: Self-Reported Pain Score: 8  Clinically the patient looks like a 2/10 Reported level is inconsistent with clinical obrservations Information on the proper use of the pain score provided to the patient today. Pain Type: Chronic pain Pain Location: Back Pain Orientation: Lower Pain Descriptors / Indicators: Sharp Pain Frequency: Constant  The patient comes into the clinics today for pharmacological management of her chronic pain. I last saw this patient on 06/19/2015. The patient  reports that she does not use drugs. Her body mass index is 28.25 kg/m.  Today the patient brought a letter from the rheumatologist asking if we could take over her Valium prescriptions. I have talked to the patient about the CDC guidelines and I have explained to her how we did not prescribe any benzodiazepines for any of our patients. She understood and accepted. I did provide her the option of being evaluated by a psychiatrist to determine if it's the clinic necessary for her to continue on the medication. She has accepted and today I have placed the consult. Should the psychiatrist find that she needs to medication, then we will be asking for the psychiatrist to take over its management.  Date of Last Visit: 06/19/15 Service Provided on Last Visit: Med Refill  Controlled Substance Pharmacotherapy Assessment & REMS (Risk Evaluation and Mitigation Strategy)  Analgesic: Oxycodone IR 5 mg 2 tablets  every 6 hours (40 mg/day of oxycodone per day) MME/day: 60 mg/day Pill Count: Oxycodone 5 mg #178 out of  240 remaining. Filled 08-28-15. Pharmacokinetics: Onset of action (Liberation/Absorption): Within expected pharmacological parameters Time to Peak effect (Distribution): Timing and results are as within normal expected parameters Duration of action (Metabolism/Excretion): Within normal limits for medication Pharmacodynamics: Analgesic Effect: More than 50% Activity Facilitation: Medication(s) allow patient to sit, stand, walk, and do the basic ADLs Perceived Effectiveness: Described as relatively effective, allowing for increase in activities of daily living (ADL) Side-effects or Adverse reactions: None reported Monitoring: [place] PMP: Online review of the past 46-month period conducted. Compliant with practice rules and regulations Last UDS on record: Tox[employer]ure Select 13  Date Value Ref Range Status  06/19/2015 FINAL  Final    Comment:    ==================================================================== TOXASSURE SELECT 13 (MW) ==================================================================== Test                             Result       Flag       Units Drug Present and Declared for Prescription Verification   Desmethyldiazepam              447          EXPECTED   ng/mg creat   Oxazepam                       1531         EXPECTED   ng/mg creat   Temazepam                      1108         EXPECTED   ng/mg creat    Desmethyldiazepam, oxazepam, and temazepam are expected    metabolites of diazepam. Desmethyldiazepam and oxazepam are also    expected metabolites of other drugs, including chlordiazepoxide,    prazepam, clorazepate, and halazepam. Oxazepam is an expected    metabolite of temazepam. Oxazepam and temazepam are also    available as scheduled prescription medications.   Oxycodone                      1364         EXPECTED   ng/mg creat   Oxymorphone                     242          EXPECTED   ng/mg creat   Noroxycodone                   >5814        EXPECTED   ng/mg creat   Noroxymorphone                 77           EXPECTED   ng/mg creat    Sources of oxycodone are scheduled prescription medications.    Oxymorphone, noroxycodone, and noroxymorphone are expected    metabolites of oxycodone. Oxymorphone is also available as a    scheduled prescription medication. ==================================================================== Test                      Result    Flag   Units      Ref Range   Creatinine              172              mg/dL      >=  20 ==================================================================== Declared Medications:  The flagging and interpretation on this report are based on the  following declared medications.  Unexpected results may arise from  inaccuracies in the declared medications.  **Note: The testing scope of this panel includes these medications:  Diazepam (Valium)  Oxycodone  **Note: The testing scope of this panel does not include following  reported medications:  Bupropion (Wellbutrin)  Buspirone (BuSpar)  Citalopram (Celexa)  Cyclobenzaprine (Flexeril)  Epinephrine (EpiPen)  Fluconazole (Diflucan)  Hydroxychloroquine (Plaquenil)  Multivitamin  Ranitidine (Zantac)  Supplement  Vitamin B (Biotin)  Vitamin D3 ==================================================================== For clinical consultation, please call [phone]. ====================================================================    UDS interpretation: Compliant Patient informed of the CDC guidelines and recommendations to stay away from the concomitant use of benzodiazepines and opioids due to the increased risk of respiratory depression and death. Medication Assessment Form: Reviewed. Patient indicates being compliant with therapy Treatment compliance: Compliant Risk Assessment: Aberrant Behavior: None observed today Substance Use  Disorder (SUD) Risk Level: Low-to-moderate Risk of opioid abuse or dependence: 0.7-3.0% with doses ? 36 MME/day and 6.1-26% with doses ? 120 MME/day. Opioid Risk Tool (ORT) Score: Total Score: 6 Moderate Risk for SUD (Score between 4-7) Depression Scale Score: PHQ-2: PHQ-2 Total Score: 0 No depression (0) PHQ-9: PHQ-9 Total Score: 0 No depression (0-4)  Pharmacologic Plan: No change in therapy, at this time  Laboratory Chemistry  Inflammation Markers Lab Results  Component Value Date   ESRSEDRATE 5 12/31/2014   CRP <0.5 12/31/2014    Renal Function Lab Results  Component Value Date   BUN 7 03/27/2015   CREATININE 0.58 03/27/2015   GFRAA >60 03/27/2015   GFRNONAA >60 03/27/2015    Hepatic Function Lab Results  Component Value Date   AST 26 03/27/2015   ALT 26 03/27/2015   ALBUMIN 4.3 03/27/2015    Electrolytes Lab Results  Component Value Date   NA 139 03/27/2015   K 3.1 (L) 03/27/2015   CL 104 03/27/2015   CALCIUM 8.7 (L) 03/27/2015   MG 2.0 12/31/2014    Pain Modulating Vitamins Lab Results  Component Value Date   VD25OH 29.3 (L) 12/31/2014    Coagulation Parameters No results found for: INR, LABPROT, APTT, PLT  Cardiovascular No results found for: BNP, HGB, HCT  Note: Lab results reviewed.  Recent Diagnostic Imaging  No results found.  Meds  The patient has a current medication list which includes the following prescription(s): albuterol, biotin, bupropion, buspirone, vitamin d3, citalopram, cyclobenzaprine, diazepam, epinephrine, epinephrine, hydroxychloroquine, levonorgestrel, multi-vitamins, nature-throid, oxycodone, oxycodone, oxycodone, prednisone, ranitidine, and thyroid.  Current Outpatient Prescriptions on File Prior to Visit  Medication Sig  . Biotin 10 MG CAPS Take 1 tablet by mouth daily.   . busPIRone (BUSPAR) 5 MG tablet Take 5 mg by mouth daily.  . Cholecalciferol (VITAMIN D3) 1000 units CAPS Take by mouth.  . citalopram (CELEXA) 20  MG tablet Take 20 mg by mouth daily.  Marland Kitchen EPINEPHrine (AUVI-Q) 0.3 mg/0.3 mL IJ SOAJ injection   . hydroxychloroquine (PLAQUENIL) 200 MG tablet Take 200 mg by mouth 2 (two) times daily.  Marland Kitchen levonorgestrel (MIRENA) 20 MCG/24HR IUD by Intrauterine route.  . Multiple Vitamin (MULTI-VITAMINS) TABS Take by mouth.  Marland Kitchen NATURE-THROID 48.75 MG TABS Take 48.75 mg by mouth daily.  . ranitidine (ZANTAC) 150 MG tablet Take 150 mg by mouth 2 (two) times daily.    No current facility-administered medications on file prior to visit.     ROS  Constitutional: Denies any fever or chills  Gastrointestinal: No reported hemesis, hematochezia, vomiting, or acute GI distress Musculoskeletal: Denies any acute onset joint swelling, redness, loss of ROM, or weakness Neurological: No reported episodes of acute onset apraxia, aphasia, dysarthria, agnosia, amnesia, paralysis, loss of coordination, or loss of consciousness  Allergies  Ms. Hartley is allergic to bee venom; honey bee venom; penicillins; shellfish allergy; shellfish-derived products; sulfa antibiotics; erythromycin; sulfamethizole; zaleplon; codeine; erythromycin base; and trazodone.  PFSH  Medical:  Ms. Phebus  has a past medical history of Acid reflux (12/15/2011); Anxiety, generalized (12/15/2011); Arthritis; Cervical pain (12/15/2011); Depression; GERD (gastroesophageal reflux disease); Hiatal hernia; LBP (low back pain) (12/15/2011); Migraine; Muscle ache (12/15/2011); Pain (12/27/2012); and Rheumatoid arthritis (HCC) (12/15/2011). Family: family history includes Depression in her mother; Diabetes in her father; Hyperlipidemia in her mother; Hypertension in her father and mother. Surgical:  has a past surgical history that includes Cholecystectomy. Tobacco:  reports that she has quit smoking. She has never used smokeless tobacco. Alcohol:  reports that she does not drink alcohol. Drug:  reports that she does not use drugs.  Constitutional Exam  Vitals:  Blood pressure 121/82, pulse 100, temperature 98.6 F (37 C), temperature source Oral, resp. rate 18, height 5\' 6"  (1.676 m), weight 175 lb (79.4 kg), SpO2 98 %. General appearance: Well nourished, well developed, and well hydrated. In no acute distress Calculated BMI/Body habitus: Body mass index is 28.25 kg/m. (25-29.9 kg/m2) Overweight - 20% higher incidence of chronic pain Psych/Mental status: Alert and oriented x 3 (person, place, & time) Eyes: PERLA Respiratory: No evidence of acute respiratory distress  Cervical Spine Exam  Inspection: No masses, redness, or swelling Alignment: Symmetrical Functional ROM: ROM appears unrestricted Stability: No instability detected Muscle strength & Tone: Functionally intact Sensory: Unimpaired Palpation: Non-contributory  Upper Extremity (UE) Exam    Side: Right upper extremity  Side: Left upper extremity  Inspection: No masses, redness, swelling, or asymmetry  Inspection: No masses, redness, swelling, or asymmetry  Functional ROM: ROM appears unrestricted  Functional ROM: ROM appears unrestricted  Muscle strength & Tone: Functionally intact  Muscle strength & Tone: Functionally intact  Sensory: Unimpaired  Sensory: Unimpaired  Palpation: Non-contributory  Palpation: Non-contributory   Thoracic Spine Exam  Inspection: No masses, redness, or swelling Alignment: Symmetrical Functional ROM: ROM appears unrestricted Stability: No instability detected Sensory: Unimpaired Muscle strength & Tone: Functionally intact Palpation: Non-contributory  Lumbar Spine Exam  Inspection: No masses, redness, or swelling Alignment: Symmetrical Functional ROM: Decreased ROM Stability: No instability detected Muscle strength & Tone: Functionally intact Sensory: Movement-associated discomfort Palpation: Complains of area being tender to palpation Provocative Tests: Lumbar Hyperextension and rotation test: Positive bilaterally for facet joint  pain. Patrick's Maneuver: evaluation deferred today              Gait & Posture Assessment  Ambulation: Unassisted Gait: Relatively normal for age and body habitus Posture: WNL   Lower Extremity Exam    Side: Right lower extremity  Side: Left lower extremity  Inspection: No masses, redness, swelling, or asymmetry  Inspection: No masses, redness, swelling, or asymmetry  Functional ROM: ROM appears unrestricted  Functional ROM: ROM appears unrestricted  Muscle strength & Tone: Able to Toe-walk & Heel-walk without problems  Muscle strength & Tone: Functionally intact  Sensory: Unimpaired  Sensory: Unimpaired  Palpation: Non-contributory  Palpation: Non-contributory    Assessment & Plan  Primary Diagnosis & Pertinent Problem List: The primary encounter diagnosis was Chronic pain. Diagnoses of Long term current use of opiate analgesic, Opiate use (60  MME/Day), Encounter for chronic pain management, Encounter for therapeutic drug level monitoring, Chronic low back pain (Location of Primary Source of Pain) (Bilateral) (R>L), Chronic knee pain (Location of Secondary source of pain) (Bilateral) (R>L), Muscle spasm, Long term prescription benzodiazepine use, and Chronic lumbar radicular pain (Bilateral) (R>L) were also pertinent to this visit.  Visit Diagnosis: 1. Chronic pain   2. Long term current use of opiate analgesic   3. Opiate use (60 MME/Day)   4. Encounter for chronic pain management   5. Encounter for therapeutic drug level monitoring   6. Chronic low back pain (Location of Primary Source of Pain) (Bilateral) (R>L)   7. Chronic knee pain (Location of Secondary source of pain) (Bilateral) (R>L)   8. Muscle spasm   9. Long term prescription benzodiazepine use   10. Chronic lumbar radicular pain (Bilateral) (R>L)     Problems updated and reviewed during this visit: Problem  Chronic lumbar radicular pain (Bilateral) (R>L)   Right S1 Dermatomal Pain.   Long Term Prescription  Benzodiazepine Use  Acquired Hypothyroidism    Problem-specific Plan(s): No problem-specific Assessment & Plan notes found for this encounter.  No new Assessment & Plan notes have been filed under this hospital service since the last note was generated. Service: Pain Management   Plan of Care   Problem List Items Addressed This Visit      High   Chronic knee pain (Location of Secondary source of pain) (Bilateral) (R>L) (Chronic)   Relevant Medications   predniSONE (DELTASONE) 5 MG tablet   buPROPion (WELLBUTRIN) 100 MG tablet   oxyCODONE (OXY IR/ROXICODONE) 5 MG immediate release tablet   oxyCODONE (OXY IR/ROXICODONE) 5 MG immediate release tablet   oxyCODONE (OXY IR/ROXICODONE) 5 MG immediate release tablet   cyclobenzaprine (FLEXERIL) 10 MG tablet   Other Relevant Orders   GENICULAR NERVE BLOCK   KNEE INJECTION   Chronic low back pain (Location of Primary Source of Pain) (Bilateral) (R>L) (Chronic)   Relevant Medications   predniSONE (DELTASONE) 5 MG tablet   oxyCODONE (OXY IR/ROXICODONE) 5 MG immediate release tablet   oxyCODONE (OXY IR/ROXICODONE) 5 MG immediate release tablet   oxyCODONE (OXY IR/ROXICODONE) 5 MG immediate release tablet   cyclobenzaprine (FLEXERIL) 10 MG tablet   Other Relevant Orders   LUMBAR FACET(MEDIAL BRANCH NERVE BLOCK) MBNB   Chronic lumbar radicular pain (Bilateral) (R>L) (Chronic)   Relevant Orders   Lumbar Epidural Injection   Chronic pain - Primary (Chronic)   Relevant Medications   predniSONE (DELTASONE) 5 MG tablet   buPROPion (WELLBUTRIN) 100 MG tablet   oxyCODONE (OXY IR/ROXICODONE) 5 MG immediate release tablet   oxyCODONE (OXY IR/ROXICODONE) 5 MG immediate release tablet   oxyCODONE (OXY IR/ROXICODONE) 5 MG immediate release tablet   cyclobenzaprine (FLEXERIL) 10 MG tablet   Muscle spasm (Chronic)   Relevant Medications   cyclobenzaprine (FLEXERIL) 10 MG tablet     Medium   Encounter for chronic pain management   Encounter  for therapeutic drug level monitoring   Long term current use of opiate analgesic (Chronic)   Long term prescription benzodiazepine use (Chronic)   Relevant Orders   Ambulatory referral to Psychiatry   Opiate use (60 MME/Day) (Chronic)    Other Visit Diagnoses   None.      Pharmacotherapy (Medications Ordered): Meds ordered this encounter  Medications  . oxyCODONE (OXY IR/ROXICODONE) 5 MG immediate release tablet    Sig: Take 2 tablets (10 mg total) by mouth every 6 (six) hours as  needed for severe pain.    Dispense:  240 tablet    Refill:  0    Do not place this medication, or any other prescription from our practice, on "Automatic Refill". Patient may have prescription filled one day early if pharmacy is closed on scheduled refill date. Do not fill until: 09/27/15 To last until: 10/27/15  . oxyCODONE (OXY IR/ROXICODONE) 5 MG immediate release tablet    Sig: Take 2 tablets (10 mg total) by mouth every 6 (six) hours as needed for severe pain.    Dispense:  240 tablet    Refill:  0    Do not place this medication, or any other prescription from our practice, on "Automatic Refill". Patient may have prescription filled one day early if pharmacy is closed on scheduled refill date. Do not fill until: 10/27/15 To last until: 11/26/15  . oxyCODONE (OXY IR/ROXICODONE) 5 MG immediate release tablet    Sig: Take 2 tablets (10 mg total) by mouth every 6 (six) hours as needed for severe pain.    Dispense:  240 tablet    Refill:  0    Do not place this medication, or any other prescription from our practice, on "Automatic Refill". Patient may have prescription filled one day early if pharmacy is closed on scheduled refill date. Do not fill until: 11/26/15 To last until: 12/26/15  . cyclobenzaprine (FLEXERIL) 10 MG tablet    Sig: Take 1 tablet (10 mg total) by mouth 3 (three) times daily as needed for muscle spasms.    Dispense:  90 tablet    Refill:  2    Do not place this medication, or  any other prescription from our practice, on "Automatic Refill". Patient may have prescription filled one day early if pharmacy is closed on scheduled refill date.    Lab-work & Procedure Ordered: Orders Placed This Encounter  Procedures  . Lumbar Epidural Injection  . LUMBAR FACET(MEDIAL BRANCH NERVE BLOCK) MBNB  . GENICULAR NERVE BLOCK  . KNEE INJECTION  . Ambulatory referral to Psychiatry    Imaging Ordered: AMB REFERRAL TO PSYCHIATRY  Interventional Therapies: Scheduled:  None at this time. The patient apparently had a bad experience with another physician receiving blocks.    Considering:  1. Diagnostic bilateral lumbar facet block under fluoroscopic guidance and IV sedation.  2. Possible bilateral lumbar facet radiofrequency ablation under fluoroscopic guidance and IV sedation.  3. Diagnostic right-sided L5-S1 lumbar epidural steroid injection under fluoroscopic guidance, with or without sedation.  4. Possible right-sided L5-S1 versus S1 transforaminal epidural steroid injection under fluoroscopic guidance, with or without sedation.  5. Diagnostic bilateral intra-articular knee injections with local anesthetic and steroid, without fluoroscopy or IV sedation.  6. Possible series of 5 intra-articular Hyalgan knee injections without fluoroscopy or sedation.  7. Diagnostic bilateral Genicular nerve block under fluoroscopic guidance and IV sedation.  8. Possible bilateral Genicular nerve radiofrequency ablation under fluoroscopic guidance and IV sedation.    PRN Procedures:   1. Diagnostic bilateral lumbar facet block under fluoroscopic guidance and IV sedation.  2. Diagnostic right-sided L5-S1 lumbar epidural steroid injection under fluoroscopic guidance, with or without sedation.  3. Diagnostic bilateral intra-articular knee injections with local anesthetic and steroid, without fluoroscopy or IV sedation.  4. Diagnostic bilateral Genicular nerve block under fluoroscopic guidance  and IV sedation.    Referral(s) or Consult(s): None at this time.  New Prescriptions   No medications on file    Medications administered during this visit: Ms. Zappia had  no medications administered during this visit.  Requested PM Follow-up: Return in 3 months (on 11/25/2015) for (3-Mo) Med-Mgmt.  Future Appointments Date Time Provider Department Center  11/25/2015 11:20 AM Delano Metz, MD [facility] None    Primary Care Physician: Charisse March, MD Location: [facility] Note by: Sydnee Levans. Laban Emperor, M.D, DABA, DABAPM, DABPM, DABIPP, FIPP  Pain Score Disclaimer: We use the NRS-11 scale. This is a self-reported, subjective measurement of pain severity with only modest accuracy. It is used primarily to identify changes within a particular patient. It must be understood that outpatient pain scales are significantly less accurate that those used for research, where they can be applied under ideal controlled circumstances with minimal exposure to variables. In reality, the score is likely to be a combination of pain intensity and pain affect, where pain affect describes the degree of emotional arousal or changes in action readiness caused by the sensory experience of pain. Factors such as social and work situation, setting, emotional state, anxiety levels, expectation, and prior pain experience may influence pain perception and show large inter-individual differences that may also be affected by time variables.  Patient instructions provided during this appointment: Patient Instructions  You were given 3 prescriptions for Oxycodone today.

## 2015-09-05 NOTE — Patient Instructions (Signed)
You were given 3 prescriptions for Oxycodone today. 

## 2015-09-05 NOTE — Progress Notes (Signed)
Safety precautions to be maintained throughout the outpatient stay will include: orient to surroundings, keep bed in low position, maintain call bell within reach at all times, provide assistance with transfer out of bed and ambulation.   Oxycodone 5 mg #178 out of 240 remaining. Filled 08-28-15.

## 2015-09-12 DIAGNOSIS — J3081 Allergic rhinitis due to animal (cat) (dog) hair and dander: Secondary | ICD-10-CM | POA: Diagnosis not present

## 2015-09-12 DIAGNOSIS — J301 Allergic rhinitis due to pollen: Secondary | ICD-10-CM | POA: Diagnosis not present

## 2015-09-12 DIAGNOSIS — J302 Other seasonal allergic rhinitis: Secondary | ICD-10-CM | POA: Diagnosis not present

## 2015-09-18 DIAGNOSIS — J301 Allergic rhinitis due to pollen: Secondary | ICD-10-CM | POA: Diagnosis not present

## 2015-09-18 DIAGNOSIS — J3081 Allergic rhinitis due to animal (cat) (dog) hair and dander: Secondary | ICD-10-CM | POA: Diagnosis not present

## 2015-09-18 DIAGNOSIS — J302 Other seasonal allergic rhinitis: Secondary | ICD-10-CM | POA: Diagnosis not present

## 2015-09-25 DIAGNOSIS — J3081 Allergic rhinitis due to animal (cat) (dog) hair and dander: Secondary | ICD-10-CM | POA: Diagnosis not present

## 2015-09-25 DIAGNOSIS — J302 Other seasonal allergic rhinitis: Secondary | ICD-10-CM | POA: Diagnosis not present

## 2015-09-25 DIAGNOSIS — J301 Allergic rhinitis due to pollen: Secondary | ICD-10-CM | POA: Diagnosis not present

## 2015-10-02 DIAGNOSIS — J3081 Allergic rhinitis due to animal (cat) (dog) hair and dander: Secondary | ICD-10-CM | POA: Diagnosis not present

## 2015-10-02 DIAGNOSIS — Z23 Encounter for immunization: Secondary | ICD-10-CM | POA: Diagnosis not present

## 2015-10-02 DIAGNOSIS — J301 Allergic rhinitis due to pollen: Secondary | ICD-10-CM | POA: Diagnosis not present

## 2015-10-02 DIAGNOSIS — J302 Other seasonal allergic rhinitis: Secondary | ICD-10-CM | POA: Diagnosis not present

## 2015-10-08 DIAGNOSIS — J301 Allergic rhinitis due to pollen: Secondary | ICD-10-CM | POA: Diagnosis not present

## 2015-10-08 DIAGNOSIS — J3081 Allergic rhinitis due to animal (cat) (dog) hair and dander: Secondary | ICD-10-CM | POA: Diagnosis not present

## 2015-10-08 DIAGNOSIS — M545 Low back pain: Secondary | ICD-10-CM | POA: Diagnosis not present

## 2015-10-08 DIAGNOSIS — J302 Other seasonal allergic rhinitis: Secondary | ICD-10-CM | POA: Diagnosis not present

## 2015-10-08 DIAGNOSIS — R202 Paresthesia of skin: Secondary | ICD-10-CM | POA: Diagnosis not present

## 2015-10-16 DIAGNOSIS — J302 Other seasonal allergic rhinitis: Secondary | ICD-10-CM | POA: Diagnosis not present

## 2015-10-16 DIAGNOSIS — J301 Allergic rhinitis due to pollen: Secondary | ICD-10-CM | POA: Diagnosis not present

## 2015-10-16 DIAGNOSIS — J3081 Allergic rhinitis due to animal (cat) (dog) hair and dander: Secondary | ICD-10-CM | POA: Diagnosis not present

## 2015-10-23 DIAGNOSIS — J302 Other seasonal allergic rhinitis: Secondary | ICD-10-CM | POA: Diagnosis not present

## 2015-10-23 DIAGNOSIS — J301 Allergic rhinitis due to pollen: Secondary | ICD-10-CM | POA: Diagnosis not present

## 2015-10-23 DIAGNOSIS — J3081 Allergic rhinitis due to animal (cat) (dog) hair and dander: Secondary | ICD-10-CM | POA: Diagnosis not present

## 2015-10-25 DIAGNOSIS — S0500XA Injury of conjunctiva and corneal abrasion without foreign body, unspecified eye, initial encounter: Secondary | ICD-10-CM

## 2015-10-25 DIAGNOSIS — S0501XA Injury of conjunctiva and corneal abrasion without foreign body, right eye, initial encounter: Secondary | ICD-10-CM | POA: Diagnosis not present

## 2015-10-25 DIAGNOSIS — Z87891 Personal history of nicotine dependence: Secondary | ICD-10-CM | POA: Diagnosis not present

## 2015-10-25 DIAGNOSIS — H5711 Ocular pain, right eye: Secondary | ICD-10-CM | POA: Diagnosis not present

## 2015-10-25 DIAGNOSIS — H539 Unspecified visual disturbance: Secondary | ICD-10-CM | POA: Diagnosis not present

## 2015-10-25 HISTORY — DX: Injury of conjunctiva and corneal abrasion without foreign body, unspecified eye, initial encounter: S05.00XA

## 2015-10-28 ENCOUNTER — Other Ambulatory Visit: Payer: Self-pay | Admitting: Pain Medicine

## 2015-10-28 DIAGNOSIS — J302 Other seasonal allergic rhinitis: Secondary | ICD-10-CM | POA: Diagnosis not present

## 2015-10-28 DIAGNOSIS — J301 Allergic rhinitis due to pollen: Secondary | ICD-10-CM | POA: Diagnosis not present

## 2015-10-28 DIAGNOSIS — S0501XA Injury of conjunctiva and corneal abrasion without foreign body, right eye, initial encounter: Secondary | ICD-10-CM | POA: Diagnosis not present

## 2015-10-28 DIAGNOSIS — J3081 Allergic rhinitis due to animal (cat) (dog) hair and dander: Secondary | ICD-10-CM | POA: Diagnosis not present

## 2015-11-04 DIAGNOSIS — J3081 Allergic rhinitis due to animal (cat) (dog) hair and dander: Secondary | ICD-10-CM | POA: Diagnosis not present

## 2015-11-04 DIAGNOSIS — J302 Other seasonal allergic rhinitis: Secondary | ICD-10-CM | POA: Diagnosis not present

## 2015-11-04 DIAGNOSIS — J301 Allergic rhinitis due to pollen: Secondary | ICD-10-CM | POA: Diagnosis not present

## 2015-11-11 DIAGNOSIS — J302 Other seasonal allergic rhinitis: Secondary | ICD-10-CM | POA: Diagnosis not present

## 2015-11-11 DIAGNOSIS — J3081 Allergic rhinitis due to animal (cat) (dog) hair and dander: Secondary | ICD-10-CM | POA: Diagnosis not present

## 2015-11-11 DIAGNOSIS — J301 Allergic rhinitis due to pollen: Secondary | ICD-10-CM | POA: Diagnosis not present

## 2015-11-25 ENCOUNTER — Encounter: Payer: Self-pay | Admitting: Pain Medicine

## 2015-11-25 ENCOUNTER — Ambulatory Visit: Payer: Commercial Managed Care - HMO | Attending: Pain Medicine | Admitting: Pain Medicine

## 2015-11-25 DIAGNOSIS — K219 Gastro-esophageal reflux disease without esophagitis: Secondary | ICD-10-CM | POA: Insufficient documentation

## 2015-11-25 DIAGNOSIS — M069 Rheumatoid arthritis, unspecified: Secondary | ICD-10-CM | POA: Diagnosis not present

## 2015-11-25 DIAGNOSIS — M47892 Other spondylosis, cervical region: Secondary | ICD-10-CM | POA: Insufficient documentation

## 2015-11-25 DIAGNOSIS — M62838 Other muscle spasm: Secondary | ICD-10-CM

## 2015-11-25 DIAGNOSIS — H579 Unspecified disorder of eye and adnexa: Secondary | ICD-10-CM | POA: Insufficient documentation

## 2015-11-25 DIAGNOSIS — Z87891 Personal history of nicotine dependence: Secondary | ICD-10-CM | POA: Diagnosis not present

## 2015-11-25 DIAGNOSIS — G894 Chronic pain syndrome: Secondary | ICD-10-CM

## 2015-11-25 DIAGNOSIS — M25512 Pain in left shoulder: Secondary | ICD-10-CM | POA: Diagnosis not present

## 2015-11-25 DIAGNOSIS — M79641 Pain in right hand: Secondary | ICD-10-CM | POA: Insufficient documentation

## 2015-11-25 DIAGNOSIS — F411 Generalized anxiety disorder: Secondary | ICD-10-CM | POA: Diagnosis not present

## 2015-11-25 DIAGNOSIS — M4804 Spinal stenosis, thoracic region: Secondary | ICD-10-CM | POA: Insufficient documentation

## 2015-11-25 DIAGNOSIS — M25511 Pain in right shoulder: Secondary | ICD-10-CM | POA: Insufficient documentation

## 2015-11-25 DIAGNOSIS — Z79899 Other long term (current) drug therapy: Secondary | ICD-10-CM | POA: Insufficient documentation

## 2015-11-25 DIAGNOSIS — M50223 Other cervical disc displacement at C6-C7 level: Secondary | ICD-10-CM | POA: Diagnosis not present

## 2015-11-25 DIAGNOSIS — Z5181 Encounter for therapeutic drug level monitoring: Secondary | ICD-10-CM | POA: Insufficient documentation

## 2015-11-25 DIAGNOSIS — Z9884 Bariatric surgery status: Secondary | ICD-10-CM | POA: Insufficient documentation

## 2015-11-25 DIAGNOSIS — M47896 Other spondylosis, lumbar region: Secondary | ICD-10-CM | POA: Insufficient documentation

## 2015-11-25 DIAGNOSIS — E039 Hypothyroidism, unspecified: Secondary | ICD-10-CM | POA: Insufficient documentation

## 2015-11-25 DIAGNOSIS — Z79891 Long term (current) use of opiate analgesic: Secondary | ICD-10-CM | POA: Insufficient documentation

## 2015-11-25 DIAGNOSIS — M5416 Radiculopathy, lumbar region: Secondary | ICD-10-CM | POA: Insufficient documentation

## 2015-11-25 DIAGNOSIS — Z8619 Personal history of other infectious and parasitic diseases: Secondary | ICD-10-CM | POA: Diagnosis not present

## 2015-11-25 DIAGNOSIS — F329 Major depressive disorder, single episode, unspecified: Secondary | ICD-10-CM | POA: Diagnosis not present

## 2015-11-25 DIAGNOSIS — E876 Hypokalemia: Secondary | ICD-10-CM | POA: Diagnosis not present

## 2015-11-25 DIAGNOSIS — M79642 Pain in left hand: Secondary | ICD-10-CM | POA: Insufficient documentation

## 2015-11-25 MED ORDER — OXYCODONE HCL 5 MG PO TABS: 10.0000 mg | ORAL_TABLET | Freq: Four times a day (QID) | ORAL | 0 refills | Status: DC | PRN

## 2015-11-25 MED ORDER — CYCLOBENZAPRINE HCL 10 MG PO TABS: 10.0000 mg | ORAL_TABLET | Freq: Three times a day (TID) | ORAL | 0 refills | Status: DC

## 2015-11-25 NOTE — Progress Notes (Signed)
Nursing Pain Medication Assessment:  Safety precautions to be maintained throughout the outpatient stay will include: orient to surroundings, keep bed in low position, maintain call bell within reach at all times, provide assistance with transfer out of bed and ambulation.  Medication Inspection Compliance: Pill count conducted under aseptic conditions, in front of the patient. Neither the pills nor the bottle was removed from the patient's sight at any time. Once count was completed pills were immediately returned to the patient in their original bottle. Pill Count: 13 of 240 pills remain Bottle Appearance: Standard pharmacy container. Clearly labeled. Medication: Oxycodone IR Filled Date: 09 /23 / 2017

## 2015-11-25 NOTE — Progress Notes (Signed)
Patient's Name: Holly Spears  MRN: [id number]  Referring Provider: Judene Companion, MD  DOB: [birth year]  PCP: Judene Companion, MD  DOS: 11/25/2015  Note by: Kathlen Brunswick. Dossie Arbour, MD  Service setting: Ambulatory outpatient  Specialty: Interventional Pain Management  Location: [facility] (AMB) Pain Management Facility    Patient type: Established   Primary Reason(s) for Visit: Encounter for prescription drug management (Level of risk: moderate) CC: Back Pain (all over the back)  HPI  Holly Spears is a 45 y.o. year old, female patient, who comes today for an initial evaluation. She has Chronic pain; Long term current use of opiate analgesic; Long term prescription opiate use; Opiate use (60 MME/Day); Encounter for therapeutic drug level monitoring; Opiate dependence ([place]); Encounter for chronic pain management; Muscle spasm; Myofascial pain; Acquired hypothyroidism; Adult hypothyroidism; Intracervical pessary; Moderate episode of recurrent major depressive disorder ([place]); Acute onset aura migraine; Rheumatoid arthritis ([place]); Musculoskeletal pain; Chronic pain syndrome; Chronic neck pain; Cervical spondylosis (C5-6 and C6-7); Cervical disc herniation (C6-7) (Left); Bulge of cervical disc without myelopathy (C5-6); Cervicogenic headache; Chronic cervical radicular pain (Bilateral) (R>L); Chronic pain of both shoulders (Bilateral) (R>L); Chronic low back pain (Location of Primary Source of Pain) (Bilateral) (R>L); Failed back surgical syndrome; 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; Lumbar spondylosis (L3-4, L4-5, and L5-S1); Lumbar bulging disc (L3-4, L4-5, and L5-S1); Chronic lumbar radicular pain (Bilateral) (R>L); Chronic knee pain (Location of Secondary source of pain) (Bilateral) (R>L); Chronic upper back pain (Left); Thoracic disc herniation (Large, Left paracentral T10-11 disc herniation); Thoracic spinal stenosis (Severe, Left, T10-11 Lateral Recess Stenosis); Thoracic foraminal stenosis (Severe Left T10-11); History  of bilateral carpal tunnel release (Bilateral); Chronic hand pain (Bilateral) (R>L); GERD (gastroesophageal reflux disease); History of psychiatric disorder; History of psychiatric care; Generalized anxiety disorder; Depression; History of shingles; History of gastric bypass; History of Roux-en-Y gastric bypass; Hypokalemia; H/O eye disorder; and Long term prescription benzodiazepine use on her problem list.. Her primarily concern today is the Back Pain (all over the back)  Pain Assessment: Self-Reported Pain Score: 2 /10             Reported level is compatible with observation.       Pain Type: Chronic pain Pain Location: Back Pain Orientation: Upper, Mid, Lower Pain Descriptors / Indicators: Aching, Constant, Radiating Pain Frequency: Constant  Holly Spears was last seen on 09/05/2015 for medication management. During today's appointment we reviewed Holly Spears's chronic pain status, as well as her outpatient medication regimen.  The patient  reports that she does not use drugs. Her body mass index is 28.25 kg/m.  Further details on both, my assessment(s), as well as the proposed treatment plan, please see below.  Controlled Substance Pharmacotherapy Assessment REMS (Risk Evaluation and Mitigation Strategy)  Analgesic:Oxycodone IR 5 mg 2 tablets every 6 hours (40 mg/day of oxycodone per day) MME/day:60 mg/day Evon Slack, RN  11/25/2015 11:11 AM  Sign at close encounter Nursing Pain Medication Assessment:  Safety precautions to be maintained throughout the outpatient stay will include: orient to surroundings, keep bed in low position, maintain call bell within reach at all times, provide assistance with transfer out of bed and ambulation.  Medication Inspection Compliance: Pill count conducted under aseptic conditions, in front of the patient. Neither the pills nor the bottle was removed from the patient's sight at any time. Once count was completed pills were immediately returned to the  patient in their original bottle. Pill Count: 13 of 240 pills remain Bottle Appearance: Standard pharmacy container. Clearly labeled.  Medication: Oxycodone IR Filled Date: 09 /23 / 2017 Pharmacokinetics: Liberation and absorption (onset of action): WNL Distribution (time to peak effect): WNL Metabolism and excretion (duration of action): WNL         Pharmacodynamics: Desired effects: Analgesia: The patient reports >50% benefit. Reported improvement in function: The patient reports medication allows her to accomplish basic ADLs. Clinically meaningful improvement in function (CMIF): Sustained CMIF goals met Perceived effectiveness: Described as relatively effective, allowing for increase in activities of daily living (ADL) Undesirable effects: Side-effects or Adverse reactions: None reported Monitoring: [place] PMP: Online review of the past 58-monthperiod conducted. Compliant with practice rules and regulations List of all UDS test(s) done:  Lab Results  Component Value Date   TOXASSSELUR FINAL 06/19/2015   TOXASSSELUR FINAL 03/27/2015   TOXASSSELUR FINAL 12/31/2014   Last UDS on record: Tox[employer]ure Select 13  Date Value Ref Range Status  06/19/2015 FINAL  Final    Comment:    ==================================================================== TOXASSURE SELECT 13 (MW) ==================================================================== Test                             Result       Flag       Units Drug Present and Declared for Prescription Verification   Desmethyldiazepam              447          EXPECTED   ng/mg creat   Oxazepam                       1531         EXPECTED   ng/mg creat   Temazepam                      1108         EXPECTED   ng/mg creat    Desmethyldiazepam, oxazepam, and temazepam are expected    metabolites of diazepam. Desmethyldiazepam and oxazepam are also    expected metabolites of other drugs, including chlordiazepoxide,    prazepam, clorazepate, and  halazepam. Oxazepam is an expected    metabolite of temazepam. Oxazepam and temazepam are also    available as scheduled prescription medications.   Oxycodone                      1364         EXPECTED   ng/mg creat   Oxymorphone                    242          EXPECTED   ng/mg creat   Noroxycodone                   >5814        EXPECTED   ng/mg creat   Noroxymorphone                 77           EXPECTED   ng/mg creat    Sources of oxycodone are scheduled prescription medications.    Oxymorphone, noroxycodone, and noroxymorphone are expected    metabolites of oxycodone. Oxymorphone is also available as a    scheduled prescription medication. ==================================================================== Test                      Result  Flag   Units      Ref Range   Creatinine              172              mg/dL      >=20 ==================================================================== Declared Medications:  The flagging and interpretation on this report are based on the  following declared medications.  Unexpected results may arise from  inaccuracies in the declared medications.  **Note: The testing scope of this panel includes these medications:  Diazepam (Valium)  Oxycodone  **Note: The testing scope of this panel does not include following  reported medications:  Bupropion (Wellbutrin)  Buspirone (BuSpar)  Citalopram (Celexa)  Cyclobenzaprine (Flexeril)  Epinephrine (EpiPen)  Fluconazole (Diflucan)  Hydroxychloroquine (Plaquenil)  Multivitamin  Ranitidine (Zantac)  Supplement  Vitamin B (Biotin)  Vitamin D3 ==================================================================== For clinical consultation, please call [phone]. ====================================================================    UDS interpretation: Compliant          Medication Assessment Form: Reviewed. Patient indicates being compliant with therapy Treatment compliance:  Compliant Risk Assessment Profile: Aberrant behavior: See prior evaluations. None observed or detected today Comorbid factors increasing risk of overdose: See prior notes. No additional risks detected today Risk of substance use disorder (SUD): Low Opioid Risk Tool (ORT) Total Score: 1  Interpretation Table:  Score <3 = Low Risk for SUD  Score between 4-7 = Moderate Risk for SUD  Score >8 = High Risk for Opioid Abuse   Risk Mitigation Strategies:  Patient Counseling:  Covered Patient-Prescriber Agreement (PPA): Present and active  Notification to other healthcare providers: Done  Pharmacologic Plan: No change in therapy, at this time  Laboratory Chemistry  Inflammation Markers Lab Results  Component Value Date   ESRSEDRATE 5 12/31/2014   CRP <0.5 12/31/2014   Renal Function Lab Results  Component Value Date   BUN 7 03/27/2015   CREATININE 0.58 03/27/2015   GFRAA >60 03/27/2015   GFRNONAA >60 03/27/2015   Hepatic Function Lab Results  Component Value Date   AST 26 03/27/2015   ALT 26 03/27/2015   ALBUMIN 4.3 03/27/2015   Electrolytes Lab Results  Component Value Date   NA 139 03/27/2015   K 3.1 (L) 03/27/2015   CL 104 03/27/2015   CALCIUM 8.7 (L) 03/27/2015   MG 2.0 12/31/2014   Pain Modulating Vitamins Lab Results  Component Value Date   VD25OH 29.3 (L) 12/31/2014   Coagulation Parameters No results found for: INR, LABPROT, APTT, PLT Cardiovascular No results found for: BNP, HGB, HCT Note: Lab results reviewed.  Recent Diagnostic Imaging Review  No results found. Note: Imaging results reviewed.  Meds  The patient has a current medication list which includes the following prescription(s): albuterol, biotin, bupropion, buspirone, calcium carbonate-vitamin d, vitamin d3, ciprofloxacin, citalopram, cyclobenzaprine, diazepam, epinephrine, hydroxychloroquine, levonorgestrel, multi-vitamins, nature-throid, oxycodone, oxycodone, oxycodone, potassium chloride  sa, prednisone, and ranitidine.  Current Outpatient Prescriptions on File Prior to Visit  Medication Sig  . albuterol (PROVENTIL HFA;VENTOLIN HFA) 108 (90 Base) MCG/ACT inhaler Frequency:PHARMDIR   Dosage:90   MCG  Instructions:  Note:2 inhalations 3 times daily as needed Dose: 90 MCG  . Biotin 10 MG CAPS Take 1 tablet by mouth daily.   Marland Kitchen buPROPion (WELLBUTRIN) 100 MG tablet Take by mouth.  . busPIRone (BUSPAR) 5 MG tablet Take 5 mg by mouth daily.  . Cholecalciferol (VITAMIN D3) 1000 units CAPS Take 1 capsule by mouth daily.   . citalopram (CELEXA) 20 MG tablet Take 20 mg  by mouth daily.  Marland Kitchen EPINEPHrine 0.3 mg/0.3 mL IJ SOAJ injection Frequency:PRN   Dosage:0.0     Instructions:  Note:Dose: 0.3MG/0.3  . hydroxychloroquine (PLAQUENIL) 200 MG tablet Take 200 mg by mouth 2 (two) times daily.  Marland Kitchen levonorgestrel (MIRENA) 20 MCG/24HR IUD by Intrauterine route.  . Multiple Vitamin (MULTI-VITAMINS) TABS Take 1 tablet by mouth 3 (three) times daily.   Marland Kitchen NATURE-THROID 48.75 MG TABS Take 48.75 mg by mouth daily.  . predniSONE (DELTASONE) 5 MG tablet Take 2.5 mg by mouth daily with breakfast.   . ranitidine (ZANTAC) 150 MG tablet Take 150 mg by mouth 2 (two) times daily.    No current facility-administered medications on file prior to visit.    ROS  Constitutional: Denies any fever or chills Gastrointestinal: No reported hemesis, hematochezia, vomiting, or acute GI distress Musculoskeletal: Denies any acute onset joint swelling, redness, loss of ROM, or weakness Neurological: No reported episodes of acute onset apraxia, aphasia, dysarthria, agnosia, amnesia, paralysis, loss of coordination, or loss of consciousness  Allergies  Holly Spears is allergic to bee venom; honey bee venom; penicillins; shellfish allergy; shellfish-derived products; sulfa antibiotics; erythromycin; sulfamethizole; zaleplon; codeine; erythromycin base; and trazodone.  PFSH  Drug: Holly Spears  reports that she does not use  drugs. Alcohol:  reports that she does not drink alcohol. Tobacco:  reports that she has quit smoking. She has never used smokeless tobacco. Medical:  has a past medical history of Acid reflux (12/15/2011); Anxiety, generalized (12/15/2011); Arthritis; Cervical pain (12/15/2011); Depression; GERD (gastroesophageal reflux disease); Hiatal hernia; LBP (low back pain) (12/15/2011); Migraine; Muscle ache (12/15/2011); Pain (12/27/2012); Rheumatoid arthritis ([place]) (12/15/2011); and Scratched cornea (10/25/2015). Family: family history includes Depression in her mother; Diabetes in her father; Hyperlipidemia in her mother; Hypertension in her father and mother.  Past Surgical History:  Procedure Laterality Date  . CHOLECYSTECTOMY     Constitutional Exam  General appearance: Well nourished, well developed, and well hydrated. In no apparent acute distress Vitals:   11/25/15 1051  BP: 127/73  Pulse: (!) 101  Resp: 16  Temp: 98.1 F (36.7 C)  TempSrc: Oral  SpO2: 100%  Weight: 175 lb (79.4 kg)  Height: 5' 6"  (1.676 m)   BMI Assessment: Estimated body mass index is 28.25 kg/m as calculated from the following:   Height as of this encounter: 5' 6"  (1.676 m).   Weight as of this encounter: 175 lb (79.4 kg).  BMI interpretation table: BMI level Category Range association with higher incidence of chronic pain  <18 kg/m2 Underweight   18.5-24.9 kg/m2 Ideal body weight   25-29.9 kg/m2 Overweight Increased incidence by 20%  30-34.9 kg/m2 Obese (Class I) Increased incidence by 68%  35-39.9 kg/m2 Severe obesity (Class II) Increased incidence by 136%  >40 kg/m2 Extreme obesity (Class III) Increased incidence by 254%   BMI Readings from Last 4 Encounters:  11/25/15 28.25 kg/m  09/05/15 28.25 kg/m  06/19/15 28.73 kg/m  03/27/15 29.86 kg/m   Wt Readings from Last 4 Encounters:  11/25/15 175 lb (79.4 kg)  09/05/15 175 lb (79.4 kg)  06/19/15 178 lb (80.7 kg)  03/27/15 185 lb (83.9 kg)   Psych/Mental status: Alert, oriented x 3 (person, place, & time) Eyes: PERLA Respiratory: No evidence of acute respiratory distress  Cervical Spine Exam  Inspection: No masses, redness, or swelling Alignment: Symmetrical Functional ROM: Unrestricted ROM Stability: No instability detected Muscle strength & Tone: Functionally intact Sensory: Unimpaired Palpation: Non-contributory  Upper Extremity (UE) Exam  Side: Right upper extremity  Side: Left upper extremity  Inspection: No masses, redness, swelling, or asymmetry  Inspection: No masses, redness, swelling, or asymmetry  Functional ROM: Unrestricted ROM         Functional ROM: Unrestricted ROM          Muscle strength & Tone: Functionally intact  Muscle strength & Tone: Functionally intact  Sensory: Unimpaired  Sensory: Unimpaired  Palpation: Non-contributory  Palpation: Non-contributory   Thoracic Spine Exam  Inspection: No masses, redness, or swelling Alignment: Symmetrical Functional ROM: Unrestricted ROM Stability: No instability detected Sensory: Unimpaired Muscle strength & Tone: Functionally intact Palpation: Non-contributory  Lumbar Spine Exam  Inspection: No masses, redness, or swelling Alignment: Symmetrical Functional ROM: Unrestricted ROM Stability: No instability detected Muscle strength & Tone: Functionally intact Sensory: Unimpaired Palpation: Non-contributory Provocative Tests: Lumbar Hyperextension and rotation test: evaluation deferred today       Patrick's Maneuver: evaluation deferred today              Gait & Posture Assessment  Ambulation: Unassisted Gait: Relatively normal for age and body habitus Posture: WNL   Lower Extremity Exam    Side: Right lower extremity  Side: Left lower extremity  Inspection: No masses, redness, swelling, or asymmetry  Inspection: No masses, redness, swelling, or asymmetry  Functional ROM: Unrestricted ROM          Functional ROM: Unrestricted ROM           Muscle strength & Tone: Functionally intact  Muscle strength & Tone: Functionally intact  Sensory: Unimpaired  Sensory: Unimpaired  Palpation: Non-contributory  Palpation: Non-contributory   Assessment  Primary Diagnosis & Pertinent Problem List: Diagnoses of Chronic pain syndrome and Muscle spasm were pertinent to this visit.  Visit Diagnosis: 1. Chronic pain syndrome   2. Muscle spasm    Plan of Care  Pharmacotherapy (Medications Ordered): Meds ordered this encounter  Medications  . oxyCODONE (OXY IR/ROXICODONE) 5 MG immediate release tablet    Sig: Take 2 tablets (10 mg total) by mouth every 6 (six) hours as needed for severe pain.    Dispense:  240 tablet    Refill:  0    Do not place this medication, or any other prescription from our practice, on "Automatic Refill". Patient may have prescription filled one day early if pharmacy is closed on scheduled refill date. Do not fill until: 12/26/15 To last until: 01/25/16  . oxyCODONE (OXY IR/ROXICODONE) 5 MG immediate release tablet    Sig: Take 2 tablets (10 mg total) by mouth every 6 (six) hours as needed for severe pain.    Dispense:  240 tablet    Refill:  0    Do not place this medication, or any other prescription from our practice, on "Automatic Refill". Patient may have prescription filled one day early if pharmacy is closed on scheduled refill date. Do not fill until: 01/25/16 To last until: 02/24/16  . oxyCODONE (OXY IR/ROXICODONE) 5 MG immediate release tablet    Sig: Take 2 tablets (10 mg total) by mouth every 6 (six) hours as needed for severe pain.    Dispense:  240 tablet    Refill:  0    Do not place this medication, or any other prescription from our practice, on "Automatic Refill". Patient may have prescription filled one day early if pharmacy is closed on scheduled refill date. Do not fill until: 02/24/16 To last until: 03/25/16  . cyclobenzaprine (FLEXERIL) 10 MG tablet    Sig: Take  1 tablet (10 mg total) by  mouth 3 (three) times daily.    Dispense:  363 tablet    Refill:  0    Do not place this medication, or any other prescription from our practice, on "Automatic Refill". Patient may have prescription filled one day early if pharmacy is closed on scheduled refill date.   New Prescriptions   No medications on file   Medications administered during this visit: Holly Spears had no medications administered during this visit. Lab-work, Procedure(s), & Referral(s) Ordered: No orders of the defined types were placed in this encounter.  Imaging & Referral(s) Ordered: None  Therapies: Scheduled: None at this time. The patient apparently had a bad experience with another physician receiving blocks.    Considering: Diagnostic bilateral lumbar facet block under fluoroscopic guidance and IV sedation.  Possible bilateral lumbar facet radiofrequency ablation under fluoroscopic guidance and IV sedation.  Diagnostic right-sided L5-S1 lumbar epidural steroid injection under fluoroscopic guidance, with or without sedation.  Possible right-sided L5-S1 versus S1 transforaminal epidural steroid injection under fluoroscopic guidance, with or without sedation.  Diagnostic bilateral intra-articular knee injections with local anesthetic and steroid, without fluoroscopy or IV sedation.  Possible series of 5 intra-articular Hyalgan knee injections without fluoroscopy or sedation.  Diagnostic bilateral Genicular nerve block under fluoroscopic guidance and IV sedation.  Possible bilateral Genicular nerve radiofrequency ablation under fluoroscopic guidance and IV sedation.    PRN Procedures: Diagnostic bilateral lumbar facet block under fluoroscopic guidance and IV sedation.  Diagnostic right-sided L5-S1 lumbar epidural steroid injection under fluoroscopic guidance, with or without sedation.  Diagnostic bilateral intra-articular knee injections with local anesthetic and steroid, without fluoroscopy or IV  sedation.  Diagnostic bilateral Genicular nerve block under fluoroscopic guidance and IV sedation.    Requested PM Follow-up: Return in 3 months (on 02/27/2016) for Med-Mgmt.  Future Appointments Date Time Provider [place]  03/03/2016 11:15 AM Milinda Pointer, MD [facility] None   Primary Care Physician: Judene Companion, MD Location: [facility] Outpatient Pain Management Facility Note by: Kathlen Brunswick. Dossie Arbour, M.D, DABA, DABAPM, DABPM, DABIPP, FIPP  Pain Score Disclaimer: We use the NRS-11 scale. This is a self-reported, subjective measurement of pain severity with only modest accuracy. It is used primarily to identify changes within a particular patient. It must be understood that outpatient pain scales are significantly less accurate that those used for research, where they can be applied under ideal controlled circumstances with minimal exposure to variables. In reality, the score is likely to be a combination of pain intensity and pain affect, where pain affect describes the degree of emotional arousal or changes in action readiness caused by the sensory experience of pain. Factors such as social and work situation, setting, emotional state, anxiety levels, expectation, and prior pain experience may influence pain perception and show large inter-individual differences that may also be affected by time variables.  Patient instructions provided during this appointment: There are no Patient Instructions on file for this visit.

## 2015-11-26 DIAGNOSIS — J301 Allergic rhinitis due to pollen: Secondary | ICD-10-CM | POA: Diagnosis not present

## 2015-11-26 DIAGNOSIS — J302 Other seasonal allergic rhinitis: Secondary | ICD-10-CM | POA: Diagnosis not present

## 2015-11-26 DIAGNOSIS — J3081 Allergic rhinitis due to animal (cat) (dog) hair and dander: Secondary | ICD-10-CM | POA: Diagnosis not present

## 2015-12-05 DIAGNOSIS — J3081 Allergic rhinitis due to animal (cat) (dog) hair and dander: Secondary | ICD-10-CM | POA: Diagnosis not present

## 2015-12-05 DIAGNOSIS — J302 Other seasonal allergic rhinitis: Secondary | ICD-10-CM | POA: Diagnosis not present

## 2015-12-05 DIAGNOSIS — J301 Allergic rhinitis due to pollen: Secondary | ICD-10-CM | POA: Diagnosis not present

## 2015-12-11 DIAGNOSIS — J301 Allergic rhinitis due to pollen: Secondary | ICD-10-CM | POA: Diagnosis not present

## 2015-12-11 DIAGNOSIS — J3081 Allergic rhinitis due to animal (cat) (dog) hair and dander: Secondary | ICD-10-CM | POA: Diagnosis not present

## 2015-12-11 DIAGNOSIS — J302 Other seasonal allergic rhinitis: Secondary | ICD-10-CM | POA: Diagnosis not present

## 2015-12-18 DIAGNOSIS — J301 Allergic rhinitis due to pollen: Secondary | ICD-10-CM | POA: Diagnosis not present

## 2015-12-18 DIAGNOSIS — J3081 Allergic rhinitis due to animal (cat) (dog) hair and dander: Secondary | ICD-10-CM | POA: Diagnosis not present

## 2015-12-18 DIAGNOSIS — J302 Other seasonal allergic rhinitis: Secondary | ICD-10-CM | POA: Diagnosis not present

## 2015-12-25 DIAGNOSIS — J302 Other seasonal allergic rhinitis: Secondary | ICD-10-CM | POA: Diagnosis not present

## 2015-12-25 DIAGNOSIS — J3081 Allergic rhinitis due to animal (cat) (dog) hair and dander: Secondary | ICD-10-CM | POA: Diagnosis not present

## 2015-12-25 DIAGNOSIS — J301 Allergic rhinitis due to pollen: Secondary | ICD-10-CM | POA: Diagnosis not present

## 2016-01-01 DIAGNOSIS — J3081 Allergic rhinitis due to animal (cat) (dog) hair and dander: Secondary | ICD-10-CM | POA: Diagnosis not present

## 2016-01-01 DIAGNOSIS — J302 Other seasonal allergic rhinitis: Secondary | ICD-10-CM | POA: Diagnosis not present

## 2016-01-01 DIAGNOSIS — J301 Allergic rhinitis due to pollen: Secondary | ICD-10-CM | POA: Diagnosis not present

## 2016-01-08 DIAGNOSIS — J301 Allergic rhinitis due to pollen: Secondary | ICD-10-CM | POA: Diagnosis not present

## 2016-01-08 DIAGNOSIS — J3081 Allergic rhinitis due to animal (cat) (dog) hair and dander: Secondary | ICD-10-CM | POA: Diagnosis not present

## 2016-01-08 DIAGNOSIS — J302 Other seasonal allergic rhinitis: Secondary | ICD-10-CM | POA: Diagnosis not present

## 2016-01-16 DIAGNOSIS — J301 Allergic rhinitis due to pollen: Secondary | ICD-10-CM | POA: Diagnosis not present

## 2016-01-16 DIAGNOSIS — J3081 Allergic rhinitis due to animal (cat) (dog) hair and dander: Secondary | ICD-10-CM | POA: Diagnosis not present

## 2016-01-16 DIAGNOSIS — J302 Other seasonal allergic rhinitis: Secondary | ICD-10-CM | POA: Diagnosis not present

## 2016-01-30 DIAGNOSIS — J301 Allergic rhinitis due to pollen: Secondary | ICD-10-CM | POA: Diagnosis not present

## 2016-01-30 DIAGNOSIS — J302 Other seasonal allergic rhinitis: Secondary | ICD-10-CM | POA: Diagnosis not present

## 2016-01-30 DIAGNOSIS — J3081 Allergic rhinitis due to animal (cat) (dog) hair and dander: Secondary | ICD-10-CM | POA: Diagnosis not present

## 2016-02-04 DIAGNOSIS — J3081 Allergic rhinitis due to animal (cat) (dog) hair and dander: Secondary | ICD-10-CM | POA: Diagnosis not present

## 2016-02-04 DIAGNOSIS — J302 Other seasonal allergic rhinitis: Secondary | ICD-10-CM | POA: Diagnosis not present

## 2016-02-04 DIAGNOSIS — J301 Allergic rhinitis due to pollen: Secondary | ICD-10-CM | POA: Diagnosis not present

## 2016-02-12 DIAGNOSIS — J302 Other seasonal allergic rhinitis: Secondary | ICD-10-CM | POA: Diagnosis not present

## 2016-02-12 DIAGNOSIS — J301 Allergic rhinitis due to pollen: Secondary | ICD-10-CM | POA: Diagnosis not present

## 2016-02-12 DIAGNOSIS — J3081 Allergic rhinitis due to animal (cat) (dog) hair and dander: Secondary | ICD-10-CM | POA: Diagnosis not present

## 2016-02-18 DIAGNOSIS — J302 Other seasonal allergic rhinitis: Secondary | ICD-10-CM | POA: Diagnosis not present

## 2016-02-18 DIAGNOSIS — J301 Allergic rhinitis due to pollen: Secondary | ICD-10-CM | POA: Diagnosis not present

## 2016-02-18 DIAGNOSIS — J3081 Allergic rhinitis due to animal (cat) (dog) hair and dander: Secondary | ICD-10-CM | POA: Diagnosis not present

## 2016-02-26 DIAGNOSIS — J301 Allergic rhinitis due to pollen: Secondary | ICD-10-CM | POA: Diagnosis not present

## 2016-02-26 DIAGNOSIS — J3081 Allergic rhinitis due to animal (cat) (dog) hair and dander: Secondary | ICD-10-CM | POA: Diagnosis not present

## 2016-02-26 DIAGNOSIS — J302 Other seasonal allergic rhinitis: Secondary | ICD-10-CM | POA: Diagnosis not present

## 2016-03-03 ENCOUNTER — Encounter: Payer: Self-pay | Admitting: Pain Medicine

## 2016-03-03 ENCOUNTER — Ambulatory Visit: Payer: Commercial Managed Care - HMO | Attending: Pain Medicine | Admitting: Pain Medicine

## 2016-03-03 VITALS — BP 127/91 | HR 105 | Temp 98.4°F | Resp 16 | Ht 66.0 in | Wt 170.0 lb

## 2016-03-03 DIAGNOSIS — Z87891 Personal history of nicotine dependence: Secondary | ICD-10-CM | POA: Insufficient documentation

## 2016-03-03 DIAGNOSIS — M62838 Other muscle spasm: Secondary | ICD-10-CM | POA: Diagnosis not present

## 2016-03-03 DIAGNOSIS — G8929 Other chronic pain: Secondary | ICD-10-CM

## 2016-03-03 DIAGNOSIS — Z880 Allergy status to penicillin: Secondary | ICD-10-CM | POA: Insufficient documentation

## 2016-03-03 DIAGNOSIS — Z79899 Other long term (current) drug therapy: Secondary | ICD-10-CM | POA: Diagnosis not present

## 2016-03-03 DIAGNOSIS — M25561 Pain in right knee: Secondary | ICD-10-CM | POA: Diagnosis not present

## 2016-03-03 DIAGNOSIS — M545 Low back pain: Secondary | ICD-10-CM | POA: Diagnosis not present

## 2016-03-03 DIAGNOSIS — M5442 Lumbago with sciatica, left side: Secondary | ICD-10-CM

## 2016-03-03 DIAGNOSIS — M25562 Pain in left knee: Secondary | ICD-10-CM | POA: Diagnosis not present

## 2016-03-03 DIAGNOSIS — Z79891 Long term (current) use of opiate analgesic: Secondary | ICD-10-CM | POA: Insufficient documentation

## 2016-03-03 DIAGNOSIS — Z5181 Encounter for therapeutic drug level monitoring: Secondary | ICD-10-CM | POA: Insufficient documentation

## 2016-03-03 DIAGNOSIS — G894 Chronic pain syndrome: Secondary | ICD-10-CM | POA: Diagnosis not present

## 2016-03-03 DIAGNOSIS — M5416 Radiculopathy, lumbar region: Secondary | ICD-10-CM | POA: Diagnosis not present

## 2016-03-03 DIAGNOSIS — F1190 Opioid use, unspecified, uncomplicated: Secondary | ICD-10-CM

## 2016-03-03 DIAGNOSIS — M5441 Lumbago with sciatica, right side: Secondary | ICD-10-CM

## 2016-03-03 MED ORDER — CYCLOBENZAPRINE HCL 10 MG PO TABS: 10.0000 mg | ORAL_TABLET | Freq: Three times a day (TID) | ORAL | 0 refills | Status: DC

## 2016-03-03 MED ORDER — OXYCODONE HCL 5 MG PO TABS: 10.0000 mg | ORAL_TABLET | Freq: Four times a day (QID) | ORAL | 0 refills | Status: DC | PRN

## 2016-03-03 NOTE — Progress Notes (Signed)
Patient's Name: Holly Spears  MRN: [id number]  Referring Provider: Judene Companion, MD  DOB: 1970/07/27  PCP: Judene Companion, MD  DOS: 03/03/2016  Note by: Kathlen Brunswick. Dossie Arbour, MD  Service setting: Ambulatory outpatient  Specialty: Interventional Pain Management  Location: [facility] (AMB) Pain Management Facility    Patient type: Established   Primary Reason(s) for Visit: Encounter for prescription drug management (Level of risk: moderate) CC: No chief complaint on file.  HPI  [name] is a 46 y.o. year old, female patient, who comes today for a medication management evaluation. She has Long term current use of opiate analgesic; Long term prescription opiate use; Opiate use (60 MME/Day); Encounter for therapeutic drug level monitoring; Opiate dependence ([place]); Encounter for chronic pain management; Muscle spasm; Myofascial pain; Acquired hypothyroidism; Adult hypothyroidism; Intracervical pessary; Moderate episode of recurrent major depressive disorder ([place]); Acute onset aura migraine; Rheumatoid arthritis ([place]); Musculoskeletal pain; Chronic pain syndrome; Chronic neck pain; Cervical spondylosis (C5-6 and C6-7); Cervical disc herniation (C6-7) (Left); Bulge of cervical disc without myelopathy (C5-6); Cervicogenic headache; Chronic cervical radicular pain (Bilateral) (R>L); Chronic pain of both shoulders (Bilateral) (R>L); Chronic low back pain (Location of Primary Source of Pain) (Bilateral) (R>L); Failed back surgical syndrome; 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; Lumbar spondylosis (L3-4, L4-5, and L5-S1); Lumbar bulging disc (L3-4, L4-5, and L5-S1); Chronic lumbar radicular pain (Bilateral) (R>L); Chronic knee pain (Location of Secondary source of pain) (Bilateral) (R>L); Chronic upper back pain (Left); Thoracic disc herniation (Large, Left paracentral T10-11 disc herniation); Thoracic spinal stenosis (Severe, Left, T10-11 Lateral Recess Stenosis); Thoracic foraminal stenosis (Severe Left T10-11); History of  bilateral carpal tunnel release (Bilateral); Chronic hand pain (Bilateral) (R>L); GERD (gastroesophageal reflux disease); History of psychiatric disorder; History of psychiatric care; Generalized anxiety disorder; Depression; History of shingles; History of gastric bypass; History of Roux-en-Y gastric bypass; Hypokalemia; H/O eye disorder; and Long term prescription benzodiazepine use on her problem list. Her primarily concern today is the No chief complaint on file.  Pain Assessment: Self-Reported Pain Score: 2 /10             Reported level is compatible with observation.       Pain Type: Chronic pain Pain Location: Back Pain Orientation: Mid, Lower Pain Descriptors / Indicators: Aching, Constant Pain Frequency: Constant  Ms. Heo was last seen on 11/25/2015 for medication management. During today's appointment we reviewed [name]'s chronic pain status, as well as her outpatient medication regimen.  The patient  reports that she does not use drugs. Her body mass index is 27.44 kg/m.  Further details on both, my assessment(s), as well as the proposed treatment plan, please see below.  Controlled Substance Pharmacotherapy Assessment REMS (Risk Evaluation and Mitigation Strategy)  Analgesic:Oxycodone IR 5 mg 2 tablets every 6 hours (40 mg/day of oxycodone per day) MME/day:60 mg/day  Evon Slack, RN  03/03/2016 11:38 AM  Sign at close encounter Nursing Pain Medication Assessment:  Safety precautions to be maintained throughout the outpatient stay will include: orient to surroundings, keep bed in low position, maintain call bell within reach at all times, provide assistance with transfer out of bed and ambulation.  Medication Inspection Compliance: Pill count conducted under aseptic conditions, in front of the patient. Neither the pills nor the bottle was removed from the patient's sight at any time. Once count was completed pills were immediately returned to the patient in their  original bottle.  Medication: Oxycodone IR Pill/Patch Count: 182 of 240 pills remain Bottle Appearance: Standard pharmacy container. Clearly labeled.  Filled Date: 01 / 22/ 2018 Last Medication intake:  Today   Pharmacokinetics: Liberation and absorption (onset of action): WNL Distribution (time to peak effect): WNL Metabolism and excretion (duration of action): WNL         Pharmacodynamics: Desired effects: Analgesia: [name] reports >50% benefit. Functional ability: Patient reports that medication allows her to accomplish basic ADLs Clinically meaningful improvement in function (CMIF): Sustained CMIF goals met Perceived effectiveness: Described as relatively effective, allowing for increase in activities of daily living (ADL) Undesirable effects: Side-effects or Adverse reactions: None reported Monitoring: [place] PMP: Online review of the past 25-monthperiod conducted. Compliant with practice rules and regulations List of all UDS test(s) done:  Lab Results  Component Value Date   TOXASSSELUR FINAL 06/19/2015   TOXASSSELUR FINAL 03/27/2015   TOXASSSELUR FINAL 12/31/2014   Last UDS on record: ToxAssure Select 13  Date Value Ref Range Status  06/19/2015 FINAL  Final    Comment:    ==================================================================== TOXASSURE SELECT 13 (MW) ==================================================================== Test                             Result       Flag       Units Drug Present and Declared for Prescription Verification   Desmethyldiazepam              447          EXPECTED   ng/mg creat   Oxazepam                       1531         EXPECTED   ng/mg creat   Temazepam                      1108         EXPECTED   ng/mg creat    Desmethyldiazepam, oxazepam, and temazepam are expected    metabolites of diazepam. Desmethyldiazepam and oxazepam are also    expected metabolites of other drugs, including chlordiazepoxide,    prazepam,  clorazepate, and halazepam. Oxazepam is an expected    metabolite of temazepam. Oxazepam and temazepam are also    available as scheduled prescription medications.   Oxycodone                      1364         EXPECTED   ng/mg creat   Oxymorphone                    242          EXPECTED   ng/mg creat   Noroxycodone                   >5814        EXPECTED   ng/mg creat   Noroxymorphone                 77           EXPECTED   ng/mg creat    Sources of oxycodone are scheduled prescription medications.    Oxymorphone, noroxycodone, and noroxymorphone are expected    metabolites of oxycodone. Oxymorphone is also available as a    scheduled prescription medication. ==================================================================== Test                      Result  Flag   Units      Ref Range   Creatinine              172              mg/dL      >=20 ==================================================================== Declared Medications:  The flagging and interpretation on this report are based on the  following declared medications.  Unexpected results may arise from  inaccuracies in the declared medications.  **Note: The testing scope of this panel includes these medications:  Diazepam (Valium)  Oxycodone  **Note: The testing scope of this panel does not include following  reported medications:  Bupropion (Wellbutrin)  Buspirone (BuSpar)  Citalopram (Celexa)  Cyclobenzaprine (Flexeril)  Epinephrine (EpiPen)  Fluconazole (Diflucan)  Hydroxychloroquine (Plaquenil)  Multivitamin  Ranitidine (Zantac)  Supplement  Vitamin B (Biotin)  Vitamin D3 ==================================================================== For clinical consultation, please call [phone]. ====================================================================    UDS interpretation: Compliant          Medication Assessment Form: Reviewed. Patient indicates being compliant with therapy Treatment  compliance: Compliant Risk Assessment Profile: Aberrant behavior: See prior evaluations. None observed or detected today Comorbid factors increasing risk of overdose: See prior notes. No additional risks detected today Risk of substance use disorder (SUD): Low Opioid Risk Tool (ORT) Total Score: 3  Interpretation Table:  Score <3 = Low Risk for SUD  Score between 4-7 = Moderate Risk for SUD  Score >8 = High Risk for Opioid Abuse   Risk Mitigation Strategies:  Patient Counseling: Covered Patient-Prescriber Agreement (PPA): Present and active  Notification to other healthcare providers: Done  Pharmacologic Plan: No change in therapy, at this time  Laboratory Chemistry  Inflammation Markers Lab Results  Component Value Date   ESRSEDRATE 5 12/31/2014   CRP <0.5 12/31/2014   Renal Function Lab Results  Component Value Date   BUN 7 03/27/2015   CREATININE 0.58 03/27/2015   GFRAA >60 03/27/2015   GFRNONAA >60 03/27/2015   Hepatic Function Lab Results  Component Value Date   AST 26 03/27/2015   ALT 26 03/27/2015   ALBUMIN 4.3 03/27/2015   Electrolytes Lab Results  Component Value Date   NA 139 03/27/2015   K 3.1 (L) 03/27/2015   CL 104 03/27/2015   CALCIUM 8.7 (L) 03/27/2015   MG 2.0 12/31/2014   Pain Modulating Vitamins Lab Results  Component Value Date   VD25OH 29.3 (L) 12/31/2014   Coagulation Parameters No results found for: INR, LABPROT, APTT, PLT Cardiovascular No results found for: BNP, HGB, HCT Note: Lab results reviewed.  Recent Diagnostic Imaging Review  No results found. Note: Imaging results reviewed.          Meds  The patient has a current medication list which includes the following prescription(s): albuterol, biotin, bupropion, buspirone, calcium carbonate-vitamin d, vitamin d3, citalopram, cyclobenzaprine, diazepam, epinephrine, hydroxychloroquine, levonorgestrel, multi-vitamins, nature-throid, oxycodone, oxycodone, oxycodone, prednisone, and  ranitidine.  Current Outpatient Prescriptions on File Prior to Visit  Medication Sig  . albuterol (PROVENTIL HFA;VENTOLIN HFA) 108 (90 Base) MCG/ACT inhaler Frequency:PHARMDIR   Dosage:90   MCG  Instructions:  Note:2 inhalations 3 times daily as needed Dose: 90 MCG  . Biotin 10 MG CAPS Take 1 tablet by mouth daily.   Marland Kitchen buPROPion (WELLBUTRIN) 100 MG tablet Take by mouth.  . busPIRone (BUSPAR) 5 MG tablet Take 5 mg by mouth daily.  . Calcium Carbonate-Vitamin D 600-125 MG-UNIT TABS Take 1 tablet by mouth daily.  . Cholecalciferol (VITAMIN D3) 1000 units CAPS  Take 1 capsule by mouth daily.   . citalopram (CELEXA) 20 MG tablet Take 20 mg by mouth daily.  . diazepam (VALIUM) 5 MG tablet Take 5 mg by mouth as needed.   Marland Kitchen EPINEPHrine 0.3 mg/0.3 mL IJ SOAJ injection Frequency:PRN   Dosage:0.0     Instructions:  Note:Dose: 0.3MG/0.3  . hydroxychloroquine (PLAQUENIL) 200 MG tablet Take 200 mg by mouth 2 (two) times daily.  Marland Kitchen levonorgestrel (MIRENA) 20 MCG/24HR IUD by Intrauterine route.  . Multiple Vitamin (MULTI-VITAMINS) TABS Take 1 tablet by mouth 3 (three) times daily.   Marland Kitchen NATURE-THROID 48.75 MG TABS Take 48.75 mg by mouth daily.  . predniSONE (DELTASONE) 5 MG tablet Take 2.5 mg by mouth daily with breakfast.   . ranitidine (ZANTAC) 150 MG tablet Take 150 mg by mouth 2 (two) times daily.    No current facility-administered medications on file prior to visit.    ROS  Constitutional: Denies any fever or chills Gastrointestinal: No reported hemesis, hematochezia, vomiting, or acute GI distress Musculoskeletal: Denies any acute onset joint swelling, redness, loss of ROM, or weakness Neurological: No reported episodes of acute onset apraxia, aphasia, dysarthria, agnosia, amnesia, paralysis, loss of coordination, or loss of consciousness  Allergies  Ms. Sorci is allergic to bee venom; honey bee venom; penicillins; shellfish allergy; shellfish-derived products; sulfa antibiotics; erythromycin;  sulfamethizole; zaleplon; codeine; erythromycin base; and trazodone.  PFSH  Drug: Ms. Mclester  reports that she does not use drugs. Alcohol:  reports that she does not drink alcohol. Tobacco:  reports that she has quit smoking. She has never used smokeless tobacco. Medical:  has a past medical history of Acid reflux (12/15/2011); Anxiety, generalized (12/15/2011); Arthritis; Cervical pain (12/15/2011); Depression; GERD (gastroesophageal reflux disease); Hiatal hernia; LBP (low back pain) (12/15/2011); Migraine; Muscle ache (12/15/2011); Pain (12/27/2012); Rheumatoid arthritis ([place]) (12/15/2011); and Scratched cornea (10/25/2015). Family: family history includes Depression in her mother; Diabetes in her father; Hyperlipidemia in her mother; Hypertension in her father and mother.  Past Surgical History:  Procedure Laterality Date  . CHOLECYSTECTOMY     Constitutional Exam  General appearance: Well nourished, well developed, and well hydrated. In no apparent acute distress Vitals:   03/03/16 1112  BP: (!) 127/91  Pulse: (!) 105  Resp: 16  Temp: 98.4 F (36.9 C)  SpO2: 100%  Weight: 170 lb (77.1 kg)  Height: 5' 6"  (1.676 m)   BMI Assessment: Estimated body mass index is 27.44 kg/m as calculated from the following:   Height as of this encounter: 5' 6"  (1.676 m).   Weight as of this encounter: 170 lb (77.1 kg).  BMI interpretation table: BMI level Category Range association with higher incidence of chronic pain  <18 kg/m2 Underweight   18.5-24.9 kg/m2 Ideal body weight   25-29.9 kg/m2 Overweight Increased incidence by 20%  30-34.9 kg/m2 Obese (Class I) Increased incidence by 68%  35-39.9 kg/m2 Severe obesity (Class II) Increased incidence by 136%  >40 kg/m2 Extreme obesity (Class III) Increased incidence by 254%   BMI Readings from Last 4 Encounters:  03/03/16 27.44 kg/m  11/25/15 28.25 kg/m  09/05/15 28.25 kg/m  06/19/15 28.73 kg/m   Wt Readings from Last 4 Encounters:   03/03/16 170 lb (77.1 kg)  11/25/15 175 lb (79.4 kg)  09/05/15 175 lb (79.4 kg)  06/19/15 178 lb (80.7 kg)  Psych/Mental status: Alert, oriented x 3 (person, place, & time)       Eyes: PERLA Respiratory: No evidence of acute respiratory distress  Cervical Spine  Exam  Inspection: No masses, redness, or swelling Alignment: Symmetrical Functional ROM: Unrestricted ROM Stability: No instability detected Muscle strength & Tone: Functionally intact Sensory: Unimpaired Palpation: Non-contributory  Upper Extremity (UE) Exam    Side: Right upper extremity  Side: Left upper extremity  Inspection: No masses, redness, swelling, or asymmetry  Inspection: No masses, redness, swelling, or asymmetry  Functional ROM: Unrestricted ROM          Functional ROM: Unrestricted ROM          Muscle strength & Tone: Functionally intact  Muscle strength & Tone: Functionally intact  Sensory: Unimpaired  Sensory: Unimpaired  Palpation: Non-contributory  Palpation: Non-contributory   Thoracic Spine Exam  Inspection: No masses, redness, or swelling Alignment: Symmetrical Functional ROM: Unrestricted ROM Stability: No instability detected Sensory: Unimpaired Muscle strength & Tone: Functionally intact Palpation: Non-contributory  Lumbar Spine Exam  Inspection: No masses, redness, or swelling Alignment: Symmetrical Functional ROM: Unrestricted ROM Stability: No instability detected Muscle strength & Tone: Functionally intact Sensory: Unimpaired Palpation: Non-contributory Provocative Tests: Lumbar Hyperextension and rotation test: evaluation deferred today       Patrick's Maneuver: evaluation deferred today              Gait & Posture Assessment  Ambulation: Unassisted Gait: Relatively normal for age and body habitus Posture: WNL   Lower Extremity Exam    Side: Right lower extremity  Side: Left lower extremity  Inspection: No masses, redness, swelling, or asymmetry  Inspection: No masses,  redness, swelling, or asymmetry  Functional ROM: Unrestricted ROM          Functional ROM: Unrestricted ROM          Muscle strength & Tone: Functionally intact  Muscle strength & Tone: Functionally intact  Sensory: Unimpaired  Sensory: Unimpaired  Palpation: Non-contributory  Palpation: Non-contributory   Assessment  Primary Diagnosis & Pertinent Problem List: The primary encounter diagnosis was Chronic pain syndrome. Diagnoses of Muscle spasm, Chronic low back pain (Location of Primary Source of Pain) (Bilateral) (R>L), Chronic knee pain (Location of Secondary source of pain) (Bilateral) (R>L), Chronic lumbar radicular pain (Bilateral) (R>L), Long term current use of opiate analgesic, and Opiate use (60 MME/Day) were also pertinent to this visit.  Status Diagnosis  Controlled Controlled Controlled 1. Chronic pain syndrome   2. Muscle spasm   3. Chronic low back pain (Location of Primary Source of Pain) (Bilateral) (R>L)   4. Chronic knee pain (Location of Secondary source of pain) (Bilateral) (R>L)   5. Chronic lumbar radicular pain (Bilateral) (R>L)   6. Long term current use of opiate analgesic   7. Opiate use (60 MME/Day)      Plan of Care  Pharmacotherapy (Medications Ordered): Meds ordered this encounter  Medications  . oxyCODONE (OXY IR/ROXICODONE) 5 MG immediate release tablet    Sig: Take 2 tablets (10 mg total) by mouth every 6 (six) hours as needed for severe pain.    Dispense:  240 tablet    Refill:  0    Do not place this medication, or any other prescription from our practice, on "Automatic Refill". Patient may have prescription filled one day early if pharmacy is closed on scheduled refill date. Do not fill until: 03/25/16 To last until: 04/24/16  . oxyCODONE (OXY IR/ROXICODONE) 5 MG immediate release tablet    Sig: Take 2 tablets (10 mg total) by mouth every 6 (six) hours as needed for severe pain.    Dispense:  240 tablet    Refill:  0    Do not place this  medication, or any other prescription from our practice, on "Automatic Refill". Patient may have prescription filled one day early if pharmacy is closed on scheduled refill date. Do not fill until: 04/24/16 To last until: 05/24/16  . oxyCODONE (OXY IR/ROXICODONE) 5 MG immediate release tablet    Sig: Take 2 tablets (10 mg total) by mouth every 6 (six) hours as needed for severe pain.    Dispense:  240 tablet    Refill:  0    Do not place this medication, or any other prescription from our practice, on "Automatic Refill". Patient may have prescription filled one day early if pharmacy is closed on scheduled refill date. Do not fill until: 05/24/16 To last until: 06/23/16  . cyclobenzaprine (FLEXERIL) 10 MG tablet    Sig: Take 1 tablet (10 mg total) by mouth 3 (three) times daily.    Dispense:  270 tablet    Refill:  0    Do not place this medication, or any other prescription from our practice, on "Automatic Refill". Patient may have prescription filled one day early if pharmacy is closed on scheduled refill date.   New Prescriptions   No medications on file   Medications administered today: Ms. Blanchett had no medications administered during this visit. Lab-work, procedure(s), and/or referral(s): Orders Placed This Encounter  Procedures  . ToxASSURE Select 13 (MW), Urine  . Comprehensive metabolic panel  . C-reactive protein  . Magnesium  . Sedimentation rate  . Vitamin B12  . 25-Hydroxyvitamin D Lcms D2+D3   Imaging and/or referral(s): None  Interventional therapies: Planned, scheduled, and/or pending:   None at this time. The patient apparently had a bad experience with another physician receiving blocks.    Considering:   Diagnostic bilateral lumbar facet block under fluoroscopic guidance and IV sedation.  Possible bilateral lumbar facet radiofrequency ablation under fluoroscopic guidance and IV sedation.  Diagnostic right-sided L5-S1 lumbar epidural steroid injection under  fluoroscopic guidance, with or without sedation.  Possible right-sided L5-S1 versus S1 transforaminal epidural steroid injection under fluoroscopic guidance, with or without sedation.  Diagnostic bilateral intra-articular knee injections with local anesthetic and steroid, without fluoroscopy or IV sedation.  Possible series of 5 intra-articular Hyalgan knee injections without fluoroscopy or sedation.  Diagnostic bilateral Genicular nerve block under fluoroscopic guidance and IV sedation.  Possible bilateral Genicular nerve radiofrequency ablation under fluoroscopic guidance and IV sedation.    Palliative PRN treatment(s):   Diagnostic bilateral lumbar facet block under fluoroscopic guidance and IV sedation.  Diagnostic right-sided L5-S1 lumbar epidural steroid injection under fluoroscopic guidance, with or without sedation.  Diagnostic bilateral intra-articular knee injections with local anesthetic and steroid, without fluoroscopy or IV sedation.  Diagnostic bilateral Genicular nerve block under fluoroscopic guidance and IV sedation.    Provider-requested follow-up: Return in about 3 months (around 06/01/2016) for (NP) Med-Mgmt.  Future Appointments Date Time Provider [place]  04/28/2016 10:30 AM Milinda Pointer, MD [facility] None   Primary Care Physician: Judene Companion, MD Location: [facility] Outpatient Pain Management Facility Note by: Sequoyah Ramone A. Dossie Arbour, M.D, DABA, DABAPM, DABPM, DABIPP, FIPP Date: 03/03/2016; Time: 1:19 PM  Pain Score Disclaimer: We use the NRS-11 scale. This is a self-reported, subjective measurement of pain severity with only modest accuracy. It is used primarily to identify changes within a particular patient. It must be understood that outpatient pain scales are significantly less accurate that those used for research, where they can be applied under ideal controlled circumstances  with minimal exposure to variables. In reality, the score is likely to be a  combination of pain intensity and pain affect, where pain affect describes the degree of emotional arousal or changes in action readiness caused by the sensory experience of pain. Factors such as social and work situation, setting, emotional state, anxiety levels, expectation, and prior pain experience may influence pain perception and show large inter-individual differences that may also be affected by time variables.  Patient instructions provided during this appointment: Patient Instructions  Pain Management Discharge Instructions  General Discharge Instructions :  If you need to reach your doctor call: Monday-Friday 8:00 am - 4:00 pm at [phone] or toll free [phone].  After clinic hours [phone] to have operator reach doctor.  Bring all of your medication bottles to all your appointments in the pain clinic.  To cancel or reschedule your appointment with Pain Management please remember to call 24 hours in advance to avoid a fee.  Refer to the educational materials which you have been given on: General Risks, I had my Procedure. Discharge Instructions, Post Sedation.  Post Procedure Instructions:  The drugs you were given will stay in your system until tomorrow, so for the next 24 hours you should not drive, make any legal decisions or drink any alcoholic beverages.  You may eat anything you prefer, but it is better to start with liquids then soups and crackers, and gradually work up to solid foods.  Please notify your doctor immediately if you have any unusual bleeding, trouble breathing or pain that is not related to your normal pain.  Depending on the type of procedure that was done, some parts of your body may feel week and/or numb.  This usually clears up by tonight or the next day.  Walk with the use of an assistive device or accompanied by an adult for the 24 hours.  You may use ice on the affected area for the first 24 hours.  Put ice in a Ziploc bag and cover  with a towel and place against area 15 minutes on 15 minutes off.  You may switch to heat after 24 hours.

## 2016-03-03 NOTE — Patient Instructions (Signed)

## 2016-03-03 NOTE — Progress Notes (Signed)
Nursing Pain Medication Assessment:  Safety precautions to be maintained throughout the outpatient stay will include: orient to surroundings, keep bed in low position, maintain call bell within reach at all times, provide assistance with transfer out of bed and ambulation.  Medication Inspection Compliance: Pill count conducted under aseptic conditions, in front of the patient. Neither the pills nor the bottle was removed from the patient's sight at any time. Once count was completed pills were immediately returned to the patient in their original bottle.  Medication: Oxycodone IR Pill/Patch Count: 182 of 240 pills remain Bottle Appearance: Standard pharmacy container. Clearly labeled. Filled Date: 01 / 22/ 2018 Last Medication intake:  Today

## 2016-03-05 DIAGNOSIS — B370 Candidal stomatitis: Secondary | ICD-10-CM | POA: Insufficient documentation

## 2016-03-05 DIAGNOSIS — M0609 Rheumatoid arthritis without rheumatoid factor, multiple sites: Secondary | ICD-10-CM | POA: Diagnosis not present

## 2016-03-05 DIAGNOSIS — J3081 Allergic rhinitis due to animal (cat) (dog) hair and dander: Secondary | ICD-10-CM | POA: Diagnosis not present

## 2016-03-05 DIAGNOSIS — J301 Allergic rhinitis due to pollen: Secondary | ICD-10-CM | POA: Diagnosis not present

## 2016-03-05 DIAGNOSIS — J302 Other seasonal allergic rhinitis: Secondary | ICD-10-CM | POA: Diagnosis not present

## 2016-03-09 DIAGNOSIS — J301 Allergic rhinitis due to pollen: Secondary | ICD-10-CM | POA: Diagnosis not present

## 2016-03-09 DIAGNOSIS — J3081 Allergic rhinitis due to animal (cat) (dog) hair and dander: Secondary | ICD-10-CM | POA: Diagnosis not present

## 2016-03-09 DIAGNOSIS — J302 Other seasonal allergic rhinitis: Secondary | ICD-10-CM | POA: Diagnosis not present

## 2016-03-18 DIAGNOSIS — J302 Other seasonal allergic rhinitis: Secondary | ICD-10-CM | POA: Diagnosis not present

## 2016-03-18 DIAGNOSIS — J3081 Allergic rhinitis due to animal (cat) (dog) hair and dander: Secondary | ICD-10-CM | POA: Diagnosis not present

## 2016-03-18 DIAGNOSIS — J301 Allergic rhinitis due to pollen: Secondary | ICD-10-CM | POA: Diagnosis not present

## 2016-03-25 DIAGNOSIS — J302 Other seasonal allergic rhinitis: Secondary | ICD-10-CM | POA: Diagnosis not present

## 2016-03-25 DIAGNOSIS — J3081 Allergic rhinitis due to animal (cat) (dog) hair and dander: Secondary | ICD-10-CM | POA: Diagnosis not present

## 2016-03-25 DIAGNOSIS — J301 Allergic rhinitis due to pollen: Secondary | ICD-10-CM | POA: Diagnosis not present

## 2016-03-31 DIAGNOSIS — J302 Other seasonal allergic rhinitis: Secondary | ICD-10-CM | POA: Diagnosis not present

## 2016-03-31 DIAGNOSIS — J3081 Allergic rhinitis due to animal (cat) (dog) hair and dander: Secondary | ICD-10-CM | POA: Diagnosis not present

## 2016-03-31 DIAGNOSIS — E039 Hypothyroidism, unspecified: Secondary | ICD-10-CM | POA: Diagnosis not present

## 2016-04-01 DIAGNOSIS — J3081 Allergic rhinitis due to animal (cat) (dog) hair and dander: Secondary | ICD-10-CM | POA: Diagnosis not present

## 2016-04-01 DIAGNOSIS — J301 Allergic rhinitis due to pollen: Secondary | ICD-10-CM | POA: Diagnosis not present

## 2016-04-01 DIAGNOSIS — J302 Other seasonal allergic rhinitis: Secondary | ICD-10-CM | POA: Diagnosis not present

## 2016-04-06 DIAGNOSIS — J302 Other seasonal allergic rhinitis: Secondary | ICD-10-CM | POA: Diagnosis not present

## 2016-04-06 DIAGNOSIS — J301 Allergic rhinitis due to pollen: Secondary | ICD-10-CM | POA: Diagnosis not present

## 2016-04-06 DIAGNOSIS — J3081 Allergic rhinitis due to animal (cat) (dog) hair and dander: Secondary | ICD-10-CM | POA: Diagnosis not present

## 2016-04-15 DIAGNOSIS — J302 Other seasonal allergic rhinitis: Secondary | ICD-10-CM | POA: Diagnosis not present

## 2016-04-15 DIAGNOSIS — J3081 Allergic rhinitis due to animal (cat) (dog) hair and dander: Secondary | ICD-10-CM | POA: Diagnosis not present

## 2016-04-15 DIAGNOSIS — J301 Allergic rhinitis due to pollen: Secondary | ICD-10-CM | POA: Diagnosis not present

## 2016-04-21 DIAGNOSIS — J302 Other seasonal allergic rhinitis: Secondary | ICD-10-CM | POA: Diagnosis not present

## 2016-04-21 DIAGNOSIS — J3081 Allergic rhinitis due to animal (cat) (dog) hair and dander: Secondary | ICD-10-CM | POA: Diagnosis not present

## 2016-04-21 DIAGNOSIS — J301 Allergic rhinitis due to pollen: Secondary | ICD-10-CM | POA: Diagnosis not present

## 2016-04-28 ENCOUNTER — Ambulatory Visit: Payer: Commercial Managed Care - HMO | Attending: Pain Medicine | Admitting: Pain Medicine

## 2016-04-28 ENCOUNTER — Encounter: Payer: Self-pay | Admitting: Pain Medicine

## 2016-04-28 VITALS — BP 143/87 | HR 101 | Temp 98.1°F | Resp 20 | Ht 66.0 in | Wt 160.0 lb

## 2016-04-28 DIAGNOSIS — Z91013 Allergy to seafood: Secondary | ICD-10-CM | POA: Insufficient documentation

## 2016-04-28 DIAGNOSIS — M25512 Pain in left shoulder: Secondary | ICD-10-CM | POA: Diagnosis not present

## 2016-04-28 DIAGNOSIS — M5442 Lumbago with sciatica, left side: Secondary | ICD-10-CM | POA: Diagnosis not present

## 2016-04-28 DIAGNOSIS — Z885 Allergy status to narcotic agent status: Secondary | ICD-10-CM | POA: Insufficient documentation

## 2016-04-28 DIAGNOSIS — M542 Cervicalgia: Secondary | ICD-10-CM

## 2016-04-28 DIAGNOSIS — Z79899 Other long term (current) drug therapy: Secondary | ICD-10-CM | POA: Insufficient documentation

## 2016-04-28 DIAGNOSIS — M25561 Pain in right knee: Secondary | ICD-10-CM | POA: Diagnosis not present

## 2016-04-28 DIAGNOSIS — M25562 Pain in left knee: Secondary | ICD-10-CM | POA: Insufficient documentation

## 2016-04-28 DIAGNOSIS — G8929 Other chronic pain: Secondary | ICD-10-CM | POA: Diagnosis not present

## 2016-04-28 DIAGNOSIS — Z881 Allergy status to other antibiotic agents status: Secondary | ICD-10-CM | POA: Diagnosis not present

## 2016-04-28 DIAGNOSIS — Z880 Allergy status to penicillin: Secondary | ICD-10-CM | POA: Insufficient documentation

## 2016-04-28 DIAGNOSIS — F1190 Opioid use, unspecified, uncomplicated: Secondary | ICD-10-CM

## 2016-04-28 DIAGNOSIS — M62838 Other muscle spasm: Secondary | ICD-10-CM

## 2016-04-28 DIAGNOSIS — M545 Low back pain: Secondary | ICD-10-CM | POA: Insufficient documentation

## 2016-04-28 DIAGNOSIS — G894 Chronic pain syndrome: Secondary | ICD-10-CM

## 2016-04-28 DIAGNOSIS — Z5181 Encounter for therapeutic drug level monitoring: Secondary | ICD-10-CM | POA: Insufficient documentation

## 2016-04-28 DIAGNOSIS — M25511 Pain in right shoulder: Secondary | ICD-10-CM | POA: Insufficient documentation

## 2016-04-28 DIAGNOSIS — M5441 Lumbago with sciatica, right side: Secondary | ICD-10-CM | POA: Diagnosis not present

## 2016-04-28 DIAGNOSIS — Z79891 Long term (current) use of opiate analgesic: Secondary | ICD-10-CM | POA: Diagnosis not present

## 2016-04-28 MED ORDER — OXYCODONE HCL 5 MG PO TABS: 10.0000 mg | ORAL_TABLET | Freq: Four times a day (QID) | ORAL | 0 refills | Status: DC | PRN

## 2016-04-28 MED ORDER — CYCLOBENZAPRINE HCL 10 MG PO TABS: 10.0000 mg | ORAL_TABLET | Freq: Three times a day (TID) | ORAL | 0 refills | Status: DC

## 2016-04-28 NOTE — Progress Notes (Signed)
Nursing Pain Medication Assessment:  Safety precautions to be maintained throughout the outpatient stay will include: orient to surroundings, keep bed in low position, maintain call bell within reach at all times, provide assistance with transfer out of bed and ambulation.  Medication Inspection Compliance: Pill count conducted under aseptic conditions, in front of the patient. Neither the pills nor the bottle was removed from the patient's sight at any time. Once count was completed pills were immediately returned to the patient in their original bottle.  Medication: See above Pill/Patch Count: 213 of 240 pills remain Bottle Appearance: Standard pharmacy container. Clearly labeled. Filled Date: 03 / 23 / 2018 Last Medication intake:  Today

## 2016-04-28 NOTE — Progress Notes (Signed)
Patient's Name: Holly Spears  MRN: [id number]  Referring Provider: Judene Companion, MD  DOB: Mar 30, 1970  PCP: Judene Companion, MD  DOS: 04/28/2016  Note by: Kathlen Brunswick. Dossie Arbour, MD  Service setting: Ambulatory outpatient  Specialty: Interventional Pain Management  Location: [facility] (AMB) Pain Management Facility    Patient type: Established   Primary Reason(s) for Visit: Encounter for prescription drug management (Level of risk: moderate) CC: Back Pain (mid back just below bra line, bilaterally)  HPI  Holly Spears is a 46 y.o. year old, female patient, who comes today for a medication management evaluation. She has Long term current use of opiate analgesic; Long term prescription opiate use; Opiate use (60 MME/Day); Encounter for therapeutic drug level monitoring; Opiate dependence ([place]); Encounter for chronic pain management; Muscle spasm; Myofascial pain; Acquired hypothyroidism; Adult hypothyroidism; Intracervical pessary; Moderate episode of recurrent major depressive disorder ([place]); Acute onset aura migraine; Rheumatoid arthritis (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; Lumbar spondylosis (L3-4, L4-5, and L5-S1); Lumbar bulging disc (L3-4, L4-5, and L5-S1); Chronic lumbar radicular pain (Bilateral) (R>L); Chronic knee pain (Location of Secondary source of pain) (Bilateral) (R>L); Chronic upper back pain (Left); Thoracic disc herniation (Large, Left paracentral T10-11 disc herniation); Thoracic spinal stenosis (Severe, Left, T10-11 Lateral Recess Stenosis); Thoracic foraminal stenosis (Severe  Left T10-11); History of bilateral carpal tunnel release (Bilateral); Chronic hand pain (Bilateral) (R>L); GERD (gastroesophageal reflux disease); History of psychiatric disorder; History of psychiatric care; Generalized anxiety disorder; Depression; History of shingles; History of gastric bypass; History of Roux-en-Y gastric bypass; Hypokalemia; H/O eye disorder; and Long term prescription benzodiazepine use on her problem list. Her primarily concern today is the Back Pain (mid back just below bra line, bilaterally)  Pain Assessment: Self-Reported Pain Score: 2 /10             Reported level is compatible with observation.       Pain Type: Chronic pain Pain Location: Back Pain Orientation: Mid Pain Descriptors / Indicators: Aching, Constant, Nagging Pain Frequency: Constant  Holly Spears was last scheduled for an appointment on 03/03/2016 for medication management. During today's appointment we reviewed Ms. Stooksbury's chronic pain status, as well as her outpatient medication regimen.  The patient  reports that she does not use drugs. Her body mass index is 25.82 kg/m.  Further details on both, my assessment(s), as well as the proposed treatment plan, please see below.  Controlled Substance Pharmacotherapy Assessment REMS (Risk Evaluation and Mitigation Strategy)  Analgesic:Oxycodone IR 5 mg 2 tablets every 6 hours (40 mg/day of oxycodone per day) MME/day:60 mg/day   Angelique Holm, RN  04/28/2016 10:11 AM  Sign at close encounter Nursing Pain Medication Assessment:  Safety precautions to be maintained throughout the outpatient stay will include: orient to surroundings, keep bed in low position, maintain call bell within reach at all times, provide assistance with transfer out of bed and ambulation.  Medication Inspection Compliance: Pill count conducted under aseptic conditions, in front of the patient. Neither the pills nor the bottle was removed from the patient's sight at any time. Once  count was completed pills were immediately returned to the patient in their original bottle.  Medication: See above Pill/Patch Count:  213 of 240 pills remain Bottle Appearance: Standard pharmacy container. Clearly labeled. Filled Date: 03 / 23 / 2018 Last Medication intake:  Today   Pharmacokinetics: Liberation and absorption (onset of action): WNL Distribution (time to peak effect): WNL Metabolism and excretion (duration of action): WNL         Pharmacodynamics: Desired effects: Analgesia: [name] reports >50% benefit. Functional ability: Patient reports that medication allows her to accomplish basic ADLs Clinically meaningful improvement in function (CMIF): Sustained CMIF goals met Perceived effectiveness: Described as relatively effective, allowing for increase in activities of daily living (ADL) Undesirable effects: Side-effects or Adverse reactions: None reported Monitoring: [place] PMP: Online review of the past 60-monthperiod conducted. Compliant with practice rules and regulations List of all UDS test(s) done:  Lab Results  Component Value Date   TOXASSSELUR FINAL 06/19/2015   TOXASSSELUR FINAL 03/27/2015   TOXASSSELUR FINAL 12/31/2014   Last UDS on record: ToxAssure Select 13  Date Value Ref Range Status  06/19/2015 FINAL  Final    Comment:    ==================================================================== TOXASSURE SELECT 13 (MW) ==================================================================== Test                             Result       Flag       Units Drug Present and Declared for Prescription Verification   Desmethyldiazepam              447          EXPECTED   ng/mg creat   Oxazepam                       1531         EXPECTED   ng/mg creat   Temazepam                      1108         EXPECTED   ng/mg creat    Desmethyldiazepam, oxazepam, and temazepam are expected    metabolites of diazepam. Desmethyldiazepam and oxazepam are also    expected  metabolites of other drugs, including chlordiazepoxide,    prazepam, clorazepate, and halazepam. Oxazepam is an expected    metabolite of temazepam. Oxazepam and temazepam are also    available as scheduled prescription medications.   Oxycodone                      1364         EXPECTED   ng/mg creat   Oxymorphone                    242          EXPECTED   ng/mg creat   Noroxycodone                   >5814        EXPECTED   ng/mg creat   Noroxymorphone                 77           EXPECTED   ng/mg creat    Sources of oxycodone are scheduled prescription medications.    Oxymorphone, noroxycodone, and noroxymorphone are expected    metabolites of oxycodone. Oxymorphone is also available as a    scheduled prescription medication. ==================================================================== Test  Result    Flag   Units      Ref Range   Creatinine              172              mg/dL      >=20 ==================================================================== Declared Medications:  The flagging and interpretation on this report are based on the  following declared medications.  Unexpected results may arise from  inaccuracies in the declared medications.  **Note: The testing scope of this panel includes these medications:  Diazepam (Valium)  Oxycodone  **Note: The testing scope of this panel does not include following  reported medications:  Bupropion (Wellbutrin)  Buspirone (BuSpar)  Citalopram (Celexa)  Cyclobenzaprine (Flexeril)  Epinephrine (EpiPen)  Fluconazole (Diflucan)  Hydroxychloroquine (Plaquenil)  Multivitamin  Ranitidine (Zantac)  Supplement  Vitamin B (Biotin)  Vitamin D3 ==================================================================== For clinical consultation, please call [phone]. ====================================================================    UDS interpretation: Compliant          Medication Assessment Form:  Reviewed. Patient indicates being compliant with therapy Treatment compliance: Compliant Risk Assessment Profile: Aberrant behavior: See prior evaluations. None observed or detected today Comorbid factors increasing risk of overdose: See prior notes. No additional risks detected today Risk of substance use disorder (SUD): Low Opioid Risk Tool (ORT) Total Score:    Interpretation Table:  Score <3 = Low Risk for SUD  Score between 4-7 = Moderate Risk for SUD  Score >8 = High Risk for Opioid Abuse   Risk Mitigation Strategies:  Patient Counseling: Covered Patient-Prescriber Agreement (PPA): Present and active  Notification to other healthcare providers: Done  Pharmacologic Plan: No change in therapy, at this time  Laboratory Chemistry  Inflammation Markers Lab Results  Component Value Date   CRP <0.5 12/31/2014   ESRSEDRATE 5 12/31/2014   (CRP: Acute Phase) (ESR: Chronic Phase) Renal Function Markers Lab Results  Component Value Date   BUN 7 03/27/2015   CREATININE 0.58 03/27/2015   GFRAA >60 03/27/2015   GFRNONAA >60 03/27/2015   Hepatic Function Markers Lab Results  Component Value Date   AST 26 03/27/2015   ALT 26 03/27/2015   ALBUMIN 4.3 03/27/2015   ALKPHOS 78 03/27/2015   Electrolytes Lab Results  Component Value Date   NA 139 03/27/2015   K 3.1 (L) 03/27/2015   CL 104 03/27/2015   CALCIUM 8.7 (L) 03/27/2015   MG 2.0 12/31/2014   Neuropathy Markers No results found for: [id number] Bone Pathology Markers Lab Results  Component Value Date   ALKPHOS 78 03/27/2015   VD25OH 29.3 (L) 12/31/2014   CALCIUM 8.7 (L) 03/27/2015   Coagulation Parameters No results found for: INR, LABPROT, APTT, PLT Cardiovascular Markers No results found for: BNP, HGB, HCT Note: Lab results reviewed.  Recent Diagnostic Imaging Review  No results found. Note: Imaging results reviewed.          Meds  The patient has a current medication list which includes the following  prescription(s): biotin, bupropion, buspirone, calcium carbonate-vitamin d, vitamin d3, citalopram, cyclobenzaprine, diazepam, epinephrine, hydroxychloroquine, levonorgestrel, multi-vitamins, nature-throid, oxycodone, oxycodone, oxycodone, prednisone, and ranitidine.  Current Outpatient Prescriptions on File Prior to Visit  Medication Sig  . Biotin 10 MG CAPS Take 1 tablet by mouth daily.   Marland Kitchen buPROPion (WELLBUTRIN) 100 MG tablet Take by mouth.  . busPIRone (BUSPAR) 5 MG tablet Take 5 mg by mouth daily.  . Calcium Carbonate-Vitamin D 600-125 MG-UNIT TABS Take 1 tablet by mouth daily.  . Cholecalciferol (  VITAMIN D3) 1000 units CAPS Take 1 capsule by mouth daily.   . citalopram (CELEXA) 20 MG tablet Take 20 mg by mouth daily.  . diazepam (VALIUM) 5 MG tablet Take 5 mg by mouth as needed.   Marland Kitchen EPINEPHrine 0.3 mg/0.3 mL IJ SOAJ injection Frequency:PRN   Dosage:0.0     Instructions:  Note:Dose: 0.3MG/0.3  . hydroxychloroquine (PLAQUENIL) 200 MG tablet Take 200 mg by mouth 2 (two) times daily.  Marland Kitchen levonorgestrel (MIRENA) 20 MCG/24HR IUD by Intrauterine route.  . Multiple Vitamin (MULTI-VITAMINS) TABS Take 1 tablet by mouth 3 (three) times daily.   Marland Kitchen NATURE-THROID 48.75 MG TABS Take 48.75 mg by mouth daily.  . predniSONE (DELTASONE) 5 MG tablet Take 2.5 mg by mouth daily with breakfast.   . ranitidine (ZANTAC) 150 MG tablet Take 150 mg by mouth 2 (two) times daily.    No current facility-administered medications on file prior to visit.    ROS  Constitutional: Denies any fever or chills Gastrointestinal: No reported hemesis, hematochezia, vomiting, or acute GI distress Musculoskeletal: Denies any acute onset joint swelling, redness, loss of ROM, or weakness Neurological: No reported episodes of acute onset apraxia, aphasia, dysarthria, agnosia, amnesia, paralysis, loss of coordination, or loss of consciousness  Allergies  Ms. Hofland is allergic to bee venom; honey bee venom; penicillins; shellfish  allergy; shellfish-derived products; sulfa antibiotics; erythromycin; sulfamethizole; zaleplon; codeine; erythromycin base; and trazodone.  PFSH  Drug: Ms. Ola  reports that she does not use drugs. Alcohol:  reports that she does not drink alcohol. Tobacco:  reports that she has quit smoking. She has never used smokeless tobacco. Medical:  has a past medical history of Acid reflux (12/15/2011); Anxiety, generalized (12/15/2011); Arthritis; Cervical pain (12/15/2011); Depression; GERD (gastroesophageal reflux disease); Hiatal hernia; LBP (low back pain) (12/15/2011); Migraine; Muscle ache (12/15/2011); Pain (12/27/2012); Rheumatoid arthritis ([place]) (12/15/2011); and Scratched cornea (10/25/2015). Family: family history includes Depression in her mother; Diabetes in her father; Hyperlipidemia in her mother; Hypertension in her father and mother.  Past Surgical History:  Procedure Laterality Date  . CHOLECYSTECTOMY     Constitutional Exam  General appearance: Well nourished, well developed, and well hydrated. In no apparent acute distress Vitals:   04/28/16 0957  BP: (!) 143/87  Pulse: (!) 101  Resp: 20  Temp: 98.1 F (36.7 C)  TempSrc: Oral  SpO2: 100%  Weight: 160 lb (72.6 kg)  Height: 5' 6"  (1.676 m)   BMI Assessment: Estimated body mass index is 25.82 kg/m as calculated from the following:   Height as of this encounter: 5' 6"  (1.676 m).   Weight as of this encounter: 160 lb (72.6 kg).  BMI interpretation table: BMI level Category Range association with higher incidence of chronic pain  <18 kg/m2 Underweight   18.5-24.9 kg/m2 Ideal body weight   25-29.9 kg/m2 Overweight Increased incidence by 20%  30-34.9 kg/m2 Obese (Class I) Increased incidence by 68%  35-39.9 kg/m2 Severe obesity (Class II) Increased incidence by 136%  >40 kg/m2 Extreme obesity (Class III) Increased incidence by 254%   BMI Readings from Last 4 Encounters:  04/28/16 25.82 kg/m  03/03/16 27.44 kg/m   11/25/15 28.25 kg/m  09/05/15 28.25 kg/m   Wt Readings from Last 4 Encounters:  04/28/16 160 lb (72.6 kg)  03/03/16 170 lb (77.1 kg)  11/25/15 175 lb (79.4 kg)  09/05/15 175 lb (79.4 kg)  Psych/Mental status: Alert, oriented x 3 (person, place, & time)       Eyes: PERLA Respiratory: No  evidence of acute respiratory distress  Cervical Spine Exam  Inspection: No masses, redness, or swelling Alignment: Symmetrical Functional ROM: Unrestricted ROM Stability: No instability detected Muscle strength & Tone: Functionally intact Sensory: Unimpaired Palpation: No palpable anomalies  Upper Extremity (UE) Exam    Side: Right upper extremity  Side: Left upper extremity  Inspection: No masses, redness, swelling, or asymmetry. No contractures  Inspection: No masses, redness, swelling, or asymmetry. No contractures  Functional ROM: Unrestricted ROM          Functional ROM: Unrestricted ROM          Muscle strength & Tone: Functionally intact  Muscle strength & Tone: Functionally intact  Sensory: Unimpaired  Sensory: Unimpaired  Palpation: No palpable anomalies  Palpation: No palpable anomalies  Specialized Test(s): Deferred         Specialized Test(s): Deferred          Thoracic Spine Exam  Inspection: No masses, redness, or swelling Alignment: Symmetrical Functional ROM: Unrestricted ROM Stability: No instability detected Sensory: Unimpaired Muscle strength & Tone: No palpable anomalies  Lumbar Spine Exam  Inspection: No masses, redness, or swelling Alignment: Symmetrical Functional ROM: Unrestricted ROM Stability: No instability detected Muscle strength & Tone: Functionally intact Sensory: Unimpaired Palpation: No palpable anomalies Provocative Tests: Lumbar Hyperextension and rotation test: evaluation deferred today       Patrick's Maneuver: evaluation deferred today              Gait & Posture Assessment  Ambulation: Unassisted Gait: Relatively normal for age and body  habitus Posture: WNL   Lower Extremity Exam    Side: Right lower extremity  Side: Left lower extremity  Inspection: No masses, redness, swelling, or asymmetry. No contractures  Inspection: No masses, redness, swelling, or asymmetry. No contractures  Functional ROM: Unrestricted ROM          Functional ROM: Unrestricted ROM          Muscle strength & Tone: Functionally intact  Muscle strength & Tone: Functionally intact  Sensory: Unimpaired  Sensory: Unimpaired  Palpation: No palpable anomalies  Palpation: No palpable anomalies   Assessment  Primary Diagnosis & Pertinent Problem List: The primary encounter diagnosis was Chronic low back pain (Location of Primary Source of Pain) (Bilateral) (R>L). Diagnoses of Chronic knee pain (Location of Secondary source of pain) (Bilateral) (R>L), Chronic neck pain, Chronic pain of both shoulders (Bilateral) (R>L), Long term prescription opiate use, Opiate use (60 MME/Day), Muscle spasm, and Chronic pain syndrome were also pertinent to this visit.  Status Diagnosis  Controlled Controlled Controlled 1. Chronic low back pain (Location of Primary Source of Pain) (Bilateral) (R>L)   2. Chronic knee pain (Location of Secondary source of pain) (Bilateral) (R>L)   3. Chronic neck pain   4. Chronic pain of both shoulders (Bilateral) (R>L)   5. Long term prescription opiate use   6. Opiate use (60 MME/Day)   7. Muscle spasm   8. Chronic pain syndrome      Plan of Care  Pharmacotherapy (Medications Ordered): Meds ordered this encounter  Medications  . oxyCODONE (OXY IR/ROXICODONE) 5 MG immediate release tablet    Sig: Take 2 tablets (10 mg total) by mouth every 6 (six) hours as needed for severe pain.    Dispense:  240 tablet    Refill:  0    Do not place this medication, or any other prescription from our practice, on "Automatic Refill". Patient may have prescription filled one day early if pharmacy is  closed on scheduled refill date. Do not fill until:  08/22/16 To last until: 09/21/16  . oxyCODONE (OXY IR/ROXICODONE) 5 MG immediate release tablet    Sig: Take 2 tablets (10 mg total) by mouth every 6 (six) hours as needed for severe pain.    Dispense:  240 tablet    Refill:  0    Do not place this medication, or any other prescription from our practice, on "Automatic Refill". Patient may have prescription filled one day early if pharmacy is closed on scheduled refill date. Do not fill until: 06/23/16 To last until: 07/23/16  . oxyCODONE (OXY IR/ROXICODONE) 5 MG immediate release tablet    Sig: Take 2 tablets (10 mg total) by mouth every 6 (six) hours as needed for severe pain.    Dispense:  240 tablet    Refill:  0    Do not place this medication, or any other prescription from our practice, on "Automatic Refill". Patient may have prescription filled one day early if pharmacy is closed on scheduled refill date. Do not fill until: 07/23/16 To last until: 08/22/16  . cyclobenzaprine (FLEXERIL) 10 MG tablet    Sig: Take 1 tablet (10 mg total) by mouth 3 (three) times daily.    Dispense:  270 tablet    Refill:  0    Do not place this medication, or any other prescription from our practice, on "Automatic Refill". Patient may have prescription filled one day early if pharmacy is closed on scheduled refill date. Fill date: 06/23/16   New Prescriptions   No medications on file   Medications administered today: Ms. Rubano had no medications administered during this visit. Lab-work, procedure(s), and/or referral(s): Orders Placed This Encounter  Procedures  . ToxASSURE Select 13 (MW), Urine  . Comprehensive metabolic panel  . C-reactive protein  . Magnesium  . Sedimentation rate  . Vitamin B12  . 25-Hydroxyvitamin D Lcms D2+D3   Imaging and/or referral(s): None  Interventional therapies: Planned, scheduled, and/or pending:   Not at this time. The patient apparently had a bad experience with another physician receiving blocks.     Considering:   Diagnostic bilateral lumbar facet block  Possible bilateral lumbar facet radiofrequency ablation  Diagnostic right-sided L5-S1 lumbar epidural steroid injection  Possible right-sided L5-S1 versus S1 transforaminal epidural steroid injection  Diagnostic bilateral intra-articular knee injections with local anesthetic and steroid  Possible series of 5 intra-articular Hyalgan knee injections  Diagnostic bilateral Genicular nerve block  Possible bilateral Genicular nerve radiofrequency ablation    Palliative PRN treatment(s):   Diagnostic bilateral lumbar facet block  Diagnostic right-sided L5-S1 lumbar epidural steroid injection  Diagnostic bilateral intra-articular knee injections with local anesthetic and steroid  Diagnostic bilateral Genicular nerve block    Provider-requested follow-up: Return in 4 months (on 09/07/2016) for (Nurse Practitioner) Med-Mgmt.  Future Appointments Date Time Provider [place]  09/07/2016 11:00 AM [place], NP [facility] None   Primary Care Physician: Judene Companion, MD Location: [facility] Note by: Schneur Crowson A. Dossie Arbour, M.D, DABA, DABAPM, DABPM, DABIPP, FIPP Date: 04/28/2016; Time: 3:48 PM  Pain Score Disclaimer: We use the NRS-11 scale. This is a self-reported, subjective measurement of pain severity with only modest accuracy. It is used primarily to identify changes within a particular patient. It must be understood that outpatient pain scales are significantly less accurate that those used for research, where they can be applied under ideal controlled circumstances with minimal exposure to variables. In reality, the score is  likely to be a combination of pain intensity and pain affect, where pain affect describes the degree of emotional arousal or changes in action readiness caused by the sensory experience of pain. Factors such as social and work situation, setting, emotional state, anxiety levels,  expectation, and prior pain experience may influence pain perception and show large inter-individual differences that may also be affected by time variables.  Patient instructions provided during this appointment: There are no Patient Instructions on file for this visit.

## 2016-04-29 DIAGNOSIS — J3081 Allergic rhinitis due to animal (cat) (dog) hair and dander: Secondary | ICD-10-CM | POA: Diagnosis not present

## 2016-04-29 DIAGNOSIS — J301 Allergic rhinitis due to pollen: Secondary | ICD-10-CM | POA: Diagnosis not present

## 2016-04-29 DIAGNOSIS — J302 Other seasonal allergic rhinitis: Secondary | ICD-10-CM | POA: Diagnosis not present

## 2016-05-01 LAB — TOXASSURE SELECT 13 (MW), URINE

## 2016-05-05 DIAGNOSIS — J301 Allergic rhinitis due to pollen: Secondary | ICD-10-CM | POA: Diagnosis not present

## 2016-05-05 DIAGNOSIS — J302 Other seasonal allergic rhinitis: Secondary | ICD-10-CM | POA: Diagnosis not present

## 2016-05-05 DIAGNOSIS — J3081 Allergic rhinitis due to animal (cat) (dog) hair and dander: Secondary | ICD-10-CM | POA: Diagnosis not present

## 2016-05-13 DIAGNOSIS — J302 Other seasonal allergic rhinitis: Secondary | ICD-10-CM | POA: Diagnosis not present

## 2016-05-13 DIAGNOSIS — J301 Allergic rhinitis due to pollen: Secondary | ICD-10-CM | POA: Diagnosis not present

## 2016-05-13 DIAGNOSIS — J3081 Allergic rhinitis due to animal (cat) (dog) hair and dander: Secondary | ICD-10-CM | POA: Diagnosis not present

## 2016-05-25 DIAGNOSIS — J301 Allergic rhinitis due to pollen: Secondary | ICD-10-CM | POA: Diagnosis not present

## 2016-05-25 DIAGNOSIS — J3081 Allergic rhinitis due to animal (cat) (dog) hair and dander: Secondary | ICD-10-CM | POA: Diagnosis not present

## 2016-05-25 DIAGNOSIS — J302 Other seasonal allergic rhinitis: Secondary | ICD-10-CM | POA: Diagnosis not present

## 2016-06-02 DIAGNOSIS — J301 Allergic rhinitis due to pollen: Secondary | ICD-10-CM | POA: Diagnosis not present

## 2016-06-02 DIAGNOSIS — J302 Other seasonal allergic rhinitis: Secondary | ICD-10-CM | POA: Diagnosis not present

## 2016-06-02 DIAGNOSIS — J3081 Allergic rhinitis due to animal (cat) (dog) hair and dander: Secondary | ICD-10-CM | POA: Diagnosis not present

## 2016-06-15 DIAGNOSIS — J301 Allergic rhinitis due to pollen: Secondary | ICD-10-CM | POA: Diagnosis not present

## 2016-06-15 DIAGNOSIS — J302 Other seasonal allergic rhinitis: Secondary | ICD-10-CM | POA: Diagnosis not present

## 2016-06-15 DIAGNOSIS — J3081 Allergic rhinitis due to animal (cat) (dog) hair and dander: Secondary | ICD-10-CM | POA: Diagnosis not present

## 2016-06-23 DIAGNOSIS — J301 Allergic rhinitis due to pollen: Secondary | ICD-10-CM | POA: Diagnosis not present

## 2016-06-23 DIAGNOSIS — J3081 Allergic rhinitis due to animal (cat) (dog) hair and dander: Secondary | ICD-10-CM | POA: Diagnosis not present

## 2016-06-23 DIAGNOSIS — J302 Other seasonal allergic rhinitis: Secondary | ICD-10-CM | POA: Diagnosis not present

## 2016-06-30 DIAGNOSIS — J3081 Allergic rhinitis due to animal (cat) (dog) hair and dander: Secondary | ICD-10-CM | POA: Diagnosis not present

## 2016-06-30 DIAGNOSIS — J302 Other seasonal allergic rhinitis: Secondary | ICD-10-CM | POA: Diagnosis not present

## 2016-06-30 DIAGNOSIS — J301 Allergic rhinitis due to pollen: Secondary | ICD-10-CM | POA: Diagnosis not present

## 2016-07-02 DIAGNOSIS — Z131 Encounter for screening for diabetes mellitus: Secondary | ICD-10-CM | POA: Diagnosis not present

## 2016-07-02 DIAGNOSIS — E039 Hypothyroidism, unspecified: Secondary | ICD-10-CM | POA: Diagnosis not present

## 2016-07-02 DIAGNOSIS — Z130 Encounter for screening for diseases of the blood and blood-forming organs and certain disorders involving the immune mechanism: Secondary | ICD-10-CM | POA: Diagnosis not present

## 2016-07-02 DIAGNOSIS — Z13220 Encounter for screening for lipoid disorders: Secondary | ICD-10-CM | POA: Diagnosis not present

## 2016-07-06 DIAGNOSIS — H00024 Hordeolum internum left upper eyelid: Secondary | ICD-10-CM | POA: Diagnosis not present

## 2016-07-06 DIAGNOSIS — G501 Atypical facial pain: Secondary | ICD-10-CM | POA: Diagnosis not present

## 2016-07-06 DIAGNOSIS — J302 Other seasonal allergic rhinitis: Secondary | ICD-10-CM | POA: Diagnosis not present

## 2016-07-06 DIAGNOSIS — J301 Allergic rhinitis due to pollen: Secondary | ICD-10-CM | POA: Diagnosis not present

## 2016-07-06 DIAGNOSIS — J3081 Allergic rhinitis due to animal (cat) (dog) hair and dander: Secondary | ICD-10-CM | POA: Diagnosis not present

## 2016-07-15 DIAGNOSIS — J3081 Allergic rhinitis due to animal (cat) (dog) hair and dander: Secondary | ICD-10-CM | POA: Diagnosis not present

## 2016-07-15 DIAGNOSIS — J301 Allergic rhinitis due to pollen: Secondary | ICD-10-CM | POA: Diagnosis not present

## 2016-07-15 DIAGNOSIS — J302 Other seasonal allergic rhinitis: Secondary | ICD-10-CM | POA: Diagnosis not present

## 2016-07-16 DIAGNOSIS — Z0000 Encounter for general adult medical examination without abnormal findings: Secondary | ICD-10-CM | POA: Diagnosis not present

## 2016-07-16 DIAGNOSIS — E876 Hypokalemia: Secondary | ICD-10-CM | POA: Diagnosis not present

## 2016-07-16 DIAGNOSIS — F419 Anxiety disorder, unspecified: Secondary | ICD-10-CM | POA: Diagnosis not present

## 2016-07-16 DIAGNOSIS — F329 Major depressive disorder, single episode, unspecified: Secondary | ICD-10-CM | POA: Diagnosis not present

## 2016-07-16 DIAGNOSIS — Z79899 Other long term (current) drug therapy: Secondary | ICD-10-CM | POA: Diagnosis not present

## 2016-07-16 DIAGNOSIS — Z8639 Personal history of other endocrine, nutritional and metabolic disease: Secondary | ICD-10-CM | POA: Diagnosis not present

## 2016-07-16 DIAGNOSIS — Z1231 Encounter for screening mammogram for malignant neoplasm of breast: Secondary | ICD-10-CM | POA: Diagnosis not present

## 2016-07-16 DIAGNOSIS — E039 Hypothyroidism, unspecified: Secondary | ICD-10-CM | POA: Diagnosis not present

## 2016-07-28 DIAGNOSIS — J302 Other seasonal allergic rhinitis: Secondary | ICD-10-CM | POA: Diagnosis not present

## 2016-07-28 DIAGNOSIS — J3081 Allergic rhinitis due to animal (cat) (dog) hair and dander: Secondary | ICD-10-CM | POA: Diagnosis not present

## 2016-07-28 DIAGNOSIS — J301 Allergic rhinitis due to pollen: Secondary | ICD-10-CM | POA: Diagnosis not present

## 2016-07-30 DIAGNOSIS — Z79899 Other long term (current) drug therapy: Secondary | ICD-10-CM | POA: Diagnosis not present

## 2016-07-30 DIAGNOSIS — E876 Hypokalemia: Secondary | ICD-10-CM | POA: Diagnosis not present

## 2016-07-30 DIAGNOSIS — Z8639 Personal history of other endocrine, nutritional and metabolic disease: Secondary | ICD-10-CM | POA: Diagnosis not present

## 2016-08-28 DIAGNOSIS — M0609 Rheumatoid arthritis without rheumatoid factor, multiple sites: Secondary | ICD-10-CM | POA: Diagnosis not present

## 2016-08-28 DIAGNOSIS — M5414 Radiculopathy, thoracic region: Secondary | ICD-10-CM | POA: Diagnosis not present

## 2016-09-07 ENCOUNTER — Ambulatory Visit: Payer: Commercial Managed Care - HMO | Attending: Nurse Practitioner | Admitting: Nurse Practitioner

## 2016-09-07 ENCOUNTER — Encounter: Payer: Self-pay | Admitting: Nurse Practitioner

## 2016-09-07 VITALS — BP 138/84 | HR 93 | Temp 97.9°F | Resp 16 | Ht 66.0 in | Wt 170.0 lb

## 2016-09-07 DIAGNOSIS — M069 Rheumatoid arthritis, unspecified: Secondary | ICD-10-CM | POA: Diagnosis not present

## 2016-09-07 DIAGNOSIS — Z9884 Bariatric surgery status: Secondary | ICD-10-CM | POA: Insufficient documentation

## 2016-09-07 DIAGNOSIS — Z880 Allergy status to penicillin: Secondary | ICD-10-CM | POA: Insufficient documentation

## 2016-09-07 DIAGNOSIS — M62838 Other muscle spasm: Secondary | ICD-10-CM | POA: Diagnosis not present

## 2016-09-07 DIAGNOSIS — M4804 Spinal stenosis, thoracic region: Secondary | ICD-10-CM | POA: Diagnosis not present

## 2016-09-07 DIAGNOSIS — G894 Chronic pain syndrome: Secondary | ICD-10-CM | POA: Diagnosis not present

## 2016-09-07 DIAGNOSIS — E039 Hypothyroidism, unspecified: Secondary | ICD-10-CM | POA: Insufficient documentation

## 2016-09-07 DIAGNOSIS — Z885 Allergy status to narcotic agent status: Secondary | ICD-10-CM | POA: Insufficient documentation

## 2016-09-07 DIAGNOSIS — M9982 Other biomechanical lesions of thoracic region: Secondary | ICD-10-CM

## 2016-09-07 DIAGNOSIS — Z87891 Personal history of nicotine dependence: Secondary | ICD-10-CM | POA: Insufficient documentation

## 2016-09-07 DIAGNOSIS — K219 Gastro-esophageal reflux disease without esophagitis: Secondary | ICD-10-CM | POA: Diagnosis not present

## 2016-09-07 DIAGNOSIS — F329 Major depressive disorder, single episode, unspecified: Secondary | ICD-10-CM | POA: Diagnosis not present

## 2016-09-07 DIAGNOSIS — Z5181 Encounter for therapeutic drug level monitoring: Secondary | ICD-10-CM | POA: Diagnosis not present

## 2016-09-07 DIAGNOSIS — M5124 Other intervertebral disc displacement, thoracic region: Secondary | ICD-10-CM | POA: Diagnosis not present

## 2016-09-07 DIAGNOSIS — M47897 Other spondylosis, lumbosacral region: Secondary | ICD-10-CM | POA: Insufficient documentation

## 2016-09-07 DIAGNOSIS — Z79891 Long term (current) use of opiate analgesic: Secondary | ICD-10-CM | POA: Diagnosis not present

## 2016-09-07 DIAGNOSIS — M47896 Other spondylosis, lumbar region: Secondary | ICD-10-CM | POA: Insufficient documentation

## 2016-09-07 DIAGNOSIS — Z79899 Other long term (current) drug therapy: Secondary | ICD-10-CM | POA: Diagnosis not present

## 2016-09-07 DIAGNOSIS — F411 Generalized anxiety disorder: Secondary | ICD-10-CM | POA: Insufficient documentation

## 2016-09-07 DIAGNOSIS — M47816 Spondylosis without myelopathy or radiculopathy, lumbar region: Secondary | ICD-10-CM

## 2016-09-07 DIAGNOSIS — Z881 Allergy status to other antibiotic agents status: Secondary | ICD-10-CM | POA: Insufficient documentation

## 2016-09-07 DIAGNOSIS — K449 Diaphragmatic hernia without obstruction or gangrene: Secondary | ICD-10-CM | POA: Insufficient documentation

## 2016-09-07 MED ORDER — OXYCODONE HCL 5 MG PO TABS: 10.0000 mg | ORAL_TABLET | Freq: Four times a day (QID) | ORAL | 0 refills | Status: DC | PRN

## 2016-09-07 MED ORDER — CYCLOBENZAPRINE HCL 10 MG PO TABS: 10.0000 mg | ORAL_TABLET | Freq: Three times a day (TID) | ORAL | 0 refills | Status: DC

## 2016-09-07 NOTE — Progress Notes (Signed)
Patient's Name: Holly Spears  MRN: [id number]  Referring Provider: Judene Companion, MD  DOB: Sep 09, 1970  PCP: Judene Companion, MD  DOS: 09/07/2016  Note by: Vevelyn Francois NP  Service setting: Ambulatory outpatient  Specialty: Interventional Pain Management  Location: [facility] (AMB) Pain Management Facility    Patient type: Established    Primary Reason(s) for Visit: Encounter for prescription drug management. (Level of risk: moderate)  CC: Back Pain (middle)  HPI  Holly Spears is a 46 y.o. year old, female patient, who comes today for a medication management evaluation. She has Long term current use of opiate analgesic; Long term prescription opiate use; Opiate use (60 MME/Day); Encounter for therapeutic drug level monitoring; Opiate dependence ([place]); Encounter for chronic pain management; Muscle spasm; Myofascial pain; Acquired hypothyroidism; Adult hypothyroidism; Intracervical pessary; Moderate episode of recurrent major depressive disorder ([place]); Acute onset aura migraine; Rheumatoid arthritis ([place]); Musculoskeletal pain; Chronic pain syndrome; Chronic neck pain; Cervical spondylosis (C5-6 and C6-7); Cervical disc herniation (C6-7) (Left); Bulge of cervical disc without myelopathy (C5-6); Cervicogenic headache; Chronic cervical radicular pain (Bilateral) (R>L); Chronic pain of both shoulders (Bilateral) (R>L); Chronic low back pain (Location of Primary Source of Pain) (Bilateral) (R>L); Failed back surgical syndrome; 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; Lumbar spondylosis (L3-4, L4-5, and L5-S1); Lumbar bulging disc (L3-4, L4-5, and L5-S1); Chronic lumbar radicular pain (Bilateral) (R>L); Chronic knee pain (Location of Secondary source of pain) (Bilateral) (R>L); Chronic upper back pain (Left); Thoracic disc herniation (Large, Left paracentral T10-11 disc herniation); Thoracic spinal stenosis (Severe, Left, T10-11 Lateral Recess Stenosis); Thoracic foraminal stenosis (Severe Left T10-11); History of bilateral  carpal tunnel release (Bilateral); Chronic hand pain (Bilateral) (R>L); GERD (gastroesophageal reflux disease); History of psychiatric disorder; History of psychiatric care; Generalized anxiety disorder; Depression; History of shingles; History of gastric bypass; History of Roux-en-Y gastric bypass; Hypokalemia; H/O eye disorder; and Long term prescription benzodiazepine use on her problem list. Her primarily concern today is the Back Pain (middle)  Pain Assessment: Location: Mid Back Radiating: down the back of the legs to the heel, right worse than left Onset: More than a month ago Duration: Chronic pain Quality: Aching, Constant Severity: 2 /10 (self-reported pain score)  Note: Reported level is compatible with observation.                   Effect on ADL: pace self Timing: Constant Modifying factors: medicine   Holly Spears was last scheduled for an appointment on Visit date not found for medication management. During today's appointment we reviewed Holly Spears's chronic pain status, as well as her outpatient medication regimen. She has numbness and tingling. She states that she had a EMG at [facility] neurology.   The patient  reports that she does not use drugs. Her body mass index is 27.44 kg/m.  Further details on both, my assessment(s), as well as the proposed treatment plan, please see below.  Controlled Substance Pharmacotherapy Assessment REMS (Risk Evaluation and Mitigation Strategy)  Analgesic:Oxycodone IR 5 mg 2 tablets every 6 hours (40 mg/day of oxycodone per day) MME/day:60 mg/day   Lona Millard, RN  09/07/2016 11:27 AM  Sign at close encounter Nursing Pain Medication Assessment:  Safety precautions to be maintained throughout the outpatient stay will include: orient to surroundings, keep bed in low position, maintain call bell within reach at all times, provide assistance with transfer out of bed and ambulation.  Medication Inspection Compliance: Pill count conducted under  aseptic conditions, in front of the patient. Neither the pills  nor the bottle was removed from the patient's sight at any time. Once count was completed pills were immediately returned to the patient in their original bottle.  Medication: Oxycodone IR Pill/Patch Count: 116 of 240 pills remain Pill/Patch Appearance: Markings consistent with prescribed medication Bottle Appearance: Standard pharmacy container. Clearly labeled. Filled Date: 07 / 21 / 2018 Last Medication intake:  Today   Pharmacokinetics: Liberation and absorption (onset of action): WNL Distribution (time to peak effect): WNL Metabolism and excretion (duration of action): WNL         Pharmacodynamics: Desired effects: Analgesia: Holly Spears reports >50% benefit. Functional ability: Patient reports that medication allows her to accomplish basic ADLs Clinically meaningful improvement in function (CMIF): Sustained CMIF goals met Perceived effectiveness: Described as relatively effective, allowing for increase in activities of daily living (ADL) Undesirable effects: Side-effects or Adverse reactions: None reported Monitoring: [place] PMP: Online review of the past 53-monthperiod conducted. Compliant with practice rules and regulations List of all UDS test(s) done:  Lab Results  Component Value Date   TOXASSSELUR FINAL 04/28/2016   TOXASSSELUR FINAL 06/19/2015   TOXASSSELUR FINAL 03/27/2015   TOXASSSELUR FINAL 12/31/2014   Last UDS on record: ToxAssure Select 13  Date Value Ref Range Status  04/28/2016 FINAL  Final    Comment:    ==================================================================== TOXASSURE SELECT 13 (MW) ==================================================================== Test                             Result       Flag       Units Drug Present and Declared for Prescription Verification   Oxycodone                      468          EXPECTED   ng/mg creat   Oxymorphone                    547           EXPECTED   ng/mg creat   Noroxycodone                   3029         EXPECTED   ng/mg creat    Sources of oxycodone include scheduled prescription medications.    Oxymorphone and noroxycodone are expected metabolites of    oxycodone. Oxymorphone is also available as a scheduled    prescription medication. Drug Absent but Declared for Prescription Verification   Diazepam                       Not Detected UNEXPECTED ng/mg creat ==================================================================== Test                      Result    Flag   Units      Ref Range   Creatinine              34               mg/dL      >=20 ==================================================================== Declared Medications:  The flagging and interpretation on this report are based on the  following declared medications.  Unexpected results may arise from  inaccuracies in the declared medications.  **Note: The testing scope of this panel includes these medications:  Diazepam (Valium)  Oxycodone  **Note: The testing scope of this panel does  not include following  reported medications:  Bupropion (Wellbutrin)  Buspirone (BuSpar)  Calcium Carbonate  Citalopram (Celexa)  Cyclobenzaprine (Flexeril)  Epinephrine  Hydroxychloroquine (Plaquenil)  Levonorgestrel (Mirena)  Multivitamin  Prednisone (Deltasone)  Ranitidine (Zantac)  Supplement  Vitamin B (Biotin)  Vitamin D  Vitamin D3 ==================================================================== For clinical consultation, please call [phone]. ====================================================================    UDS interpretation: Compliant          Medication Assessment Form: Reviewed. Patient indicates being compliant with therapy Treatment compliance: Compliant Risk Assessment Profile: Aberrant behavior: See prior evaluations. None observed or detected today Comorbid factors increasing risk of overdose: See prior notes. No additional  risks detected today Risk of substance use disorder (SUD): Low Opioid Risk Tool (ORT) Total Score: 1  Interpretation Table:  Score <3 = Low Risk for SUD  Score between 4-7 = Moderate Risk for SUD  Score >8 = High Risk for Opioid Abuse   Risk Mitigation Strategies:  Patient Counseling: Covered Patient-Prescriber Agreement (PPA): Present and active  Notification to other healthcare providers: Done  Pharmacologic Plan: No change in therapy, at this time  Laboratory Chemistry  Inflammation Markers (CRP: Acute Phase) (ESR: Chronic Phase) Lab Results  Component Value Date   CRP <0.5 12/31/2014   ESRSEDRATE 5 12/31/2014                 Renal Function Markers Lab Results  Component Value Date   BUN 7 03/27/2015   CREATININE 0.58 03/27/2015   GFRAA >60 03/27/2015   GFRNONAA >60 03/27/2015                 Hepatic Function Markers Lab Results  Component Value Date   AST 26 03/27/2015   ALT 26 03/27/2015   ALBUMIN 4.3 03/27/2015   ALKPHOS 78 03/27/2015                 Electrolytes Lab Results  Component Value Date   NA 139 03/27/2015   K 3.1 (L) 03/27/2015   CL 104 03/27/2015   CALCIUM 8.7 (L) 03/27/2015   MG 2.0 12/31/2014                 Neuropathy Markers No results found for: [id number]               Bone Pathology Markers Lab Results  Component Value Date   ALKPHOS 78 03/27/2015   VD25OH 29.3 (L) 12/31/2014   CALCIUM 8.7 (L) 03/27/2015                 Coagulation Parameters No results found for: INR, LABPROT, APTT, PLT               Cardiovascular Markers No results found for: BNP, HGB, HCT               Note: Lab results reviewed. Labs brought in by patient   Recent Diagnostic Imaging Review  No results found. Note: Imaging results reviewed.          Meds   Current Meds  Medication Sig  . Biotin 10 MG CAPS Take 1 tablet by mouth daily.   . busPIRone (BUSPAR) 5 MG tablet Take 5 mg by mouth daily.  . Calcium Carbonate-Vitamin D 600-125 MG-UNIT  TABS Take 1 tablet by mouth daily.  . Cholecalciferol (VITAMIN D3) 1000 units CAPS Take 1 capsule by mouth daily.   . citalopram (CELEXA) 20 MG tablet Take 20 mg by mouth daily.  Derrill Memo ON 09/21/2016] cyclobenzaprine (FLEXERIL) 10  MG tablet Take 1 tablet (10 mg total) by mouth 3 (three) times daily.  . diazepam (VALIUM) 5 MG tablet Take 5 mg by mouth as needed.   Marland Kitchen EPINEPHrine 0.3 mg/0.3 mL IJ SOAJ injection Frequency:PRN   Dosage:0.0     Instructions:  Note:Dose: 0.3MG/0.3  . hydroxychloroquine (PLAQUENIL) 200 MG tablet Take 200 mg by mouth 2 (two) times daily.  Marland Kitchen levonorgestrel (MIRENA) 20 MCG/24HR IUD by Intrauterine route.  . Multiple Vitamin (MULTI-VITAMINS) TABS Take 1 tablet by mouth 3 (three) times daily.   Marland Kitchen NATURE-THROID 48.75 MG TABS Take 48.75 mg by mouth daily.  Derrill Memo ON 09/21/2016] oxyCODONE (OXY IR/ROXICODONE) 5 MG immediate release tablet Take 2 tablets (10 mg total) by mouth every 6 (six) hours as needed for severe pain.  . potassium chloride SA (K-DUR,KLOR-CON) 20 MEQ tablet Take 20 mEq by mouth once.  . predniSONE (DELTASONE) 5 MG tablet Take 2.5 mg by mouth daily with breakfast.   . ranitidine (ZANTAC) 150 MG tablet Take 150 mg by mouth 2 (two) times daily.   . [DISCONTINUED] cyclobenzaprine (FLEXERIL) 10 MG tablet Take 1 tablet (10 mg total) by mouth 3 (three) times daily.  . [DISCONTINUED] oxyCODONE (OXY IR/ROXICODONE) 5 MG immediate release tablet Take 2 tablets (10 mg total) by mouth every 6 (six) hours as needed for severe pain.    ROS  Constitutional: Denies any fever or chills Gastrointestinal: No reported hemesis, hematochezia, vomiting, or acute GI distress Musculoskeletal: Denies any acute onset joint swelling, redness, loss of ROM, or weakness Neurological: No reported episodes of acute onset apraxia, aphasia, dysarthria, agnosia, amnesia, paralysis, loss of coordination, or loss of consciousness  Allergies  Ms. Hagadorn is allergic to bee venom; honey bee  venom; penicillins; shellfish allergy; shellfish-derived products; sulfa antibiotics; erythromycin; sulfamethizole; zaleplon; codeine; erythromycin base; and trazodone.  PFSH  Drug: Ms. Taaffe  reports that she does not use drugs. Alcohol:  reports that she does not drink alcohol. Tobacco:  reports that she has quit smoking. She has never used smokeless tobacco. Medical:  has a past medical history of Acid reflux (12/15/2011); Anxiety, generalized (12/15/2011); Arthritis; Cervical pain (12/15/2011); Depression; GERD (gastroesophageal reflux disease); Hiatal hernia; LBP (low back pain) (12/15/2011); Migraine; Muscle ache (12/15/2011); Pain (12/27/2012); Rheumatoid arthritis ([place]) (12/15/2011); and Scratched cornea (10/25/2015). Surgical: Ms. Kugel  has a past surgical history that includes Cholecystectomy. Family: family history includes Depression in her mother; Diabetes in her father; Hyperlipidemia in her mother; Hypertension in her father and mother.  Constitutional Exam  General appearance: Well nourished, well developed, and well hydrated. In no apparent acute distress Vitals:   09/07/16 1113  BP: 138/84  Pulse: 93  Resp: 16  Temp: 97.9 F (36.6 C)  SpO2: 100%  Weight: 170 lb (77.1 kg)  Height: _0  (1.676 m)   BMI Assessment: Estimated body mass index is 27.44 kg/m as calculated from the following:   Height as of this encounter: _1  (1.676 m).   Weight as of this encounter: 170 lb (77.1 kg).  BMI interpretation table: BMI level Category Range association with higher incidence of chronic pain  <18 kg/m2 Underweight   18.5-24.9 kg/m2 Ideal body weight   25-29.9 kg/m2 Overweight Increased incidence by 20%  30-34.9 kg/m2 Obese (Class I) Increased incidence by 68%  35-39.9 kg/m2 Severe obesity (Class II) Increased incidence by 136%  >40 kg/m2 Extreme obesity (Class III) Increased incidence by 254%   BMI Readings from Last 4 Encounters:  09/07/16 27.44 kg/m  04/28/16  25.82  kg/m  03/03/16 27.44 kg/m  11/25/15 28.25 kg/m   Wt Readings from Last 4 Encounters:  09/07/16 170 lb (77.1 kg)  04/28/16 160 lb (72.6 kg)  03/03/16 170 lb (77.1 kg)  11/25/15 175 lb (79.4 kg)  Psych/Mental status: Alert, oriented x 3 (person, place, & time)       Eyes: PERLA Respiratory: No evidence of acute respiratory distress  Cervical Spine Area Exam  Skin & Axial Inspection: No masses, redness, edema, swelling, or associated skin lesions Alignment: Symmetrical Functional ROM: Unrestricted ROM      Stability: No instability detected Muscle Tone/Strength: Functionally intact. No obvious neuro-muscular anomalies detected. Sensory (Neurological): Unimpaired Palpation: No palpable anomalies              Upper Extremity (UE) Exam    Side: Right upper extremity  Side: Left upper extremity  Skin & Extremity Inspection: Skin color, temperature, and hair growth are WNL. No peripheral edema or cyanosis. No masses, redness, swelling, asymmetry, or associated skin lesions. No contractures.  Skin & Extremity Inspection: Skin color, temperature, and hair growth are WNL. No peripheral edema or cyanosis. No masses, redness, swelling, asymmetry, or associated skin lesions. No contractures.  Functional ROM: Unrestricted ROM          Functional ROM: Unrestricted ROM          Muscle Tone/Strength: Functionally intact. No obvious neuro-muscular anomalies detected.  Muscle Tone/Strength: Functionally intact. No obvious neuro-muscular anomalies detected.  Sensory (Neurological): Unimpaired  Sensory (Neurological): Unimpaired  Palpation: No palpable anomalies              Palpation: No palpable anomalies              Specialized Test(s): Deferred         Specialized Test(s): Deferred          Thoracic Spine Area Exam  Skin & Axial Inspection: No masses, redness, or swelling Alignment: Asymmetric Functional ROM: Unrestricted ROM Stability: No instability detected Muscle Tone/Strength: Functionally  intact. No obvious neuro-muscular anomalies detected. Sensory (Neurological): Unimpaired Muscle strength & Tone: Non-tender  Lumbar Spine Area Exam  Skin & Axial Inspection: No masses, redness, or swelling Alignment: Symmetrical Functional ROM: Unrestricted ROM      Stability: No instability detected Muscle Tone/Strength: Functionally intact. No obvious neuro-muscular anomalies detected. Sensory (Neurological): Unimpaired Palpation: No palpable anomalies       Provocative Tests: Lumbar Hyperextension and rotation test: evaluation deferred today       Lumbar Lateral bending test: evaluation deferred today       Patrick's Maneuver: evaluation deferred today                    Gait & Posture Assessment  Ambulation: Unassisted Gait: Relatively normal for age and body habitus Posture: WNL   Lower Extremity Exam    Side: Right lower extremity  Side: Left lower extremity  Skin & Extremity Inspection: Skin color, temperature, and hair growth are WNL. No peripheral edema or cyanosis. No masses, redness, swelling, asymmetry, or associated skin lesions. No contractures.  Skin & Extremity Inspection: Skin color, temperature, and hair growth are WNL. No peripheral edema or cyanosis. No masses, redness, swelling, asymmetry, or associated skin lesions. No contractures.  Functional ROM: Unrestricted ROM          Functional ROM: Unrestricted ROM          Muscle Tone/Strength: Able to Toe-walk & Heel-walk without problems  Muscle Tone/Strength: Able to Toe-walk &  Heel-walk without problems  Sensory (Neurological): Unimpaired  Sensory (Neurological): Unimpaired  Palpation: No palpable anomalies  Palpation: No palpable anomalies   Assessment  Primary Diagnosis & Pertinent Problem List: The primary encounter diagnosis was Thoracic disc herniation (Large, Left paracentral T10-11 disc herniation). Diagnoses of Thoracic spinal stenosis (Severe, Left, T10-11 Lateral Recess Stenosis), Thoracic foraminal  stenosis (Severe Left T10-11), Lumbar spondylosis (L3-4, L4-5, and L5-S1), Muscle spasm, and Chronic pain syndrome were also pertinent to this visit.  Status Diagnosis  Persistent Controlled Persistent 1. Thoracic disc herniation (Large, Left paracentral T10-11 disc herniation)   2. Thoracic spinal stenosis (Severe, Left, T10-11 Lateral Recess Stenosis)   3. Thoracic foraminal stenosis (Severe Left T10-11)   4. Lumbar spondylosis (L3-4, L4-5, and L5-S1)   5. Muscle spasm   6. Chronic pain syndrome     Problems updated and reviewed during this visit: No problems updated. Plan of Care  Pharmacotherapy (Medications Ordered): Meds ordered this encounter  Medications  . oxyCODONE (OXY IR/ROXICODONE) 5 MG immediate release tablet    Sig: Take 2 tablets (10 mg total) by mouth every 6 (six) hours as needed for severe pain.    Dispense:  240 tablet    Refill:  0    Do not place this medication, or any other prescription from our practice, on "Automatic Refill". Patient may have prescription filled one day early if pharmacy is closed on scheduled refill date. Do not fill until:09/21/2016 To last until: 10/21/2016    Order Specific Question:   Supervising Provider    Answer:   Milinda Pointer [phone]  . oxyCODONE (OXY IR/ROXICODONE) 5 MG immediate release tablet    Sig: Take 2 tablets (10 mg total) by mouth every 6 (six) hours as needed for severe pain.    Dispense:  240 tablet    Refill:  0    Do not place this medication, or any other prescription from our practice, on "Automatic Refill". Patient may have prescription filled one day early if pharmacy is closed on scheduled refill date. Do not fill until: 10/21/2016 To last until: 11/20/2016    Order Specific Question:   Supervising Provider    Answer:   Milinda Pointer [phone]  . oxyCODONE (OXY IR/ROXICODONE) 5 MG immediate release tablet    Sig: Take 2 tablets (10 mg total) by mouth every 6 (six) hours as needed for severe pain.     Dispense:  240 tablet    Refill:  0    Do not place this medication, or any other prescription from our practice, on "Automatic Refill". Patient may have prescription filled one day early if pharmacy is closed on scheduled refill date. Do not fill until:11/20/2016 To last until: 12/20/2016    Order Specific Question:   Supervising Provider    Answer:   Milinda Pointer [phone]  . cyclobenzaprine (FLEXERIL) 10 MG tablet    Sig: Take 1 tablet (10 mg total) by mouth 3 (three) times daily.    Dispense:  270 tablet    Refill:  0    Do not place this medication, or any other prescription from our practice, on "Automatic Refill". Patient may have prescription filled one day early if pharmacy is closed on scheduled refill date. Fill date: 09/21/2016    Order Specific Question:   Supervising Provider    Answer:   Milinda Pointer [phone]   New Prescriptions   No medications on file   Medications administered today: Ms. Zavaleta had no medications administered during this visit. Lab-work,  procedure(s), and/or referral(s): No orders of the defined types were placed in this encounter. EMG results pending Imaging and/or referral(s): None  Interventional therapies: Planned, scheduled, and/or pending:   Not at this time.    Considering:   Diagnostic bilateral lumbar facet block  Possible bilateral lumbar facet radiofrequency ablation  Diagnostic right-sided L5-S1 lumbar epidural steroid injection  Possible right-sided L5-S1 versus S1 transforaminal epidural steroid injection  Diagnostic bilateral intra-articular knee injections with local anesthetic and steroid  Possible series of 5 intra-articular Hyalgan knee injections  Diagnostic bilateral Genicular nerve block  Possible bilateral Genicular nerve radiofrequency ablation    Palliative PRN treatment(s):   Diagnostic bilateral lumbar facet block  Diagnostic right-sided L5-S1 lumbar epidural steroid injection  Diagnostic bilateral  intra-articular knee injections with local anesthetic and steroid  Diagnostic bilateral Genicular nerve block    Provider-requested follow-up: Return in about 3 months (around 12/08/2016) for MedMgmt.  Future Appointments Date Time Provider [place]  12/07/2016 8:45 AM Vevelyn Francois, NP [facility] None   Primary Care Physician: Judene Companion, MD Location: [facility] Out[facility] Note by: Vevelyn Francois NP Date: 09/07/2016; Time: 1:10 PM  Pain Score Disclaimer: We use the NRS-11 scale. This is a self-reported, subjective measurement of pain severity with only modest accuracy. It is used primarily to identify changes within a particular patient. It must be understood that outpatient pain scales are significantly less accurate that those used for research, where they can be applied under ideal controlled circumstances with minimal exposure to variables. In reality, the score is likely to be a combination of pain intensity and pain affect, where pain affect describes the degree of emotional arousal or changes in action readiness caused by the sensory experience of pain. Factors such as social and work situation, setting, emotional state, anxiety levels, expectation, and prior pain experience may influence pain perception and show large inter-individual differences that may also be affected by time variables.  Patient instructions provided during this appointment: Patient Instructions   ____________________________________________________________________________________________  Medication Rules  Applies to: All patients receiving prescriptions (written or electronic).  Pharmacy of record: Pharmacy where electronic prescriptions will be sent. If written prescriptions are taken to a different pharmacy, please inform the nursing staff. The pharmacy listed in the electronic medical record should be the one where you would like electronic prescriptions to be  sent.  Prescription refills: Only during scheduled appointments. Applies to both, written and electronic prescriptions.  NOTE: The following applies primarily to controlled substances (Opioid* Pain Medications).   Patient's responsibilities: 1. Pain Pills: Bring all pain pills to every appointment (except for procedure appointments). 2. Pill Bottles: Bring pills in original pharmacy bottle. Always bring newest bottle. Bring bottle, even if empty. 3. Medication refills: You are responsible for knowing and keeping track of what medications you need refilled. The day before your appointment, write a list of all prescriptions that need to be refilled. Bring that list to your appointment and give it to the admitting nurse. Prescriptions will be written only during appointments. If you forget a medication, it will not be "Called in", "Faxed", or "electronically sent". You will need to get another appointment to get these prescribed. 4. Prescription Accuracy: You are responsible for carefully inspecting your prescriptions before leaving our office. Have the discharge nurse carefully go over each prescription with you, before taking them home. Make sure that your name is accurately spelled, that your address is correct. Check the name and dose of your medication to make  sure it is accurate. Check the number of pills, and the written instructions to make sure they are clear and accurate. Make sure that you are given enough medication to last until your next medication refill appointment. 5. Taking Medication: Take medication as prescribed. Never take more pills than instructed. Never take medication more frequently than prescribed. Taking less pills or less frequently is permitted and encouraged, when it comes to controlled substances (written prescriptions).  6. Inform other Doctors: Always inform, all of your healthcare providers, of all the medications you take. 7. Pain Medication from other Providers: You  are not allowed to accept any additional pain medication from any other Doctor or Healthcare provider. There are two exceptions to this rule. (see below) In the event that you require additional pain medication, you are responsible for notifying us, as stated below. 8. Medication Agreement: You are responsible for carefully reading and following our Medication Agreement. This must be signed before receiving any prescriptions from our practice. Safely store a copy of your signed Agreement. Violations to the Agreement will result in no further prescriptions. (Additional copies of our Medication Agreement are available upon request.) 9. Laws, Rules, & Regulations: All patients are expected to follow all Federal and [employer], Rules, [employer]. Ignorance of the Laws does not constitute a valid excuse. The use of any illegal substances is prohibited. 10. Adopted CDC guidelines & recommendations: Target dosing levels will be at or below 60 MME/day. Use of benzodiazepines** is not recommended.  Exceptions: There are only two exceptions to the rule of not receiving pain medications from other Healthcare Providers. 1. Exception #1 (Emergencies): In the event of an emergency (i.e.: accident requiring emergency care), you are allowed to receive additional pain medication. However, you are responsible for: As soon as you are able, call our office [phone], at any time of the day or night, and leave a message stating your name, the date and nature of the emergency, and the name and dose of the medication prescribed. In the event that your call is answered by a member of our staff, make sure to document and save the date, time, and the name of the person that took your information.  2. Exception #2 (Planned Surgery): In the event that you are scheduled by another doctor or dentist to have any type of surgery or procedure, you are allowed (for a period no longer than 30 days), to receive additional pain  medication, for the acute post-op pain. However, in this case, you are responsible for picking up a copy of our "Post-op Pain Management for Surgeons" handout, and giving it to your surgeon or dentist. This document is available at our office, and does not require an appointment to obtain it. Simply go to our office during business hours (Monday-Thursday from 8:00 AM to 4:00 PM) (Friday 8:00 AM to 12:00 Noon) or if you have a scheduled appointment with [place], prior to your surgery, and ask for it by name. In addition, you will need to provide [place] with your name, name of your surgeon, type of surgery, and date of procedure or surgery.  *Opioid medications include: morphine, codeine, oxycodone, oxymorphone, hydrocodone, hydromorphone, meperidine, tramadol, tapentadol, buprenorphine, fentanyl, methadone. **Benzodiazepine medications include: diazepam (Valium), alprazolam (Xanax), clonazepam (Klonopine), lorazepam (Ativan), clorazepate (Tranxene), chlordiazepoxide (Librium), estazolam (Prosom), oxazepam (Serax), temazepam (Restoril), triazolam (Halcion)  You were given 3 prescriptions for Oxycodone today. You were given one prescription for Flexeril. ____________________________________________________________________________________________

## 2016-09-07 NOTE — Progress Notes (Signed)
Nursing Pain Medication Assessment:  Safety precautions to be maintained throughout the outpatient stay will include: orient to surroundings, keep bed in low position, maintain call bell within reach at all times, provide assistance with transfer out of bed and ambulation.  Medication Inspection Compliance: Pill count conducted under aseptic conditions, in front of the patient. Neither the pills nor the bottle was removed from the patient's sight at any time. Once count was completed pills were immediately returned to the patient in their original bottle.  Medication: Oxycodone IR Pill/Patch Count: 116 of 240 pills remain Pill/Patch Appearance: Markings consistent with prescribed medication Bottle Appearance: Standard pharmacy container. Clearly labeled. Filled Date: 07 / 21 / 2018 Last Medication intake:  Today

## 2016-09-07 NOTE — Patient Instructions (Addendum)
____________________________________________________________________________________________  Medication Rules  Applies to: All patients receiving prescriptions (written or electronic).  Pharmacy of record: Pharmacy where electronic prescriptions will be sent. If written prescriptions are taken to a different pharmacy, please inform the nursing staff. The pharmacy listed in the electronic medical record should be the one where you would like electronic prescriptions to be sent.  Prescription refills: Only during scheduled appointments. Applies to both, written and electronic prescriptions.  NOTE: The following applies primarily to controlled substances (Opioid* Pain Medications).   Patient's responsibilities: 1. Pain Pills: Bring all pain pills to every appointment (except for procedure appointments). 2. Pill Bottles: Bring pills in original pharmacy bottle. Always bring newest bottle. Bring bottle, even if empty. 3. Medication refills: You are responsible for knowing and keeping track of what medications you need refilled. The day before your appointment, write a list of all prescriptions that need to be refilled. Bring that list to your appointment and give it to the admitting nurse. Prescriptions will be written only during appointments. If you forget a medication, it will not be "Called in", "Faxed", or "electronically sent". You will need to get another appointment to get these prescribed. 4. Prescription Accuracy: You are responsible for carefully inspecting your prescriptions before leaving our office. Have the discharge nurse carefully go over each prescription with you, before taking them home. Make sure that your name is accurately spelled, that your address is correct. Check the name and dose of your medication to make sure it is accurate. Check the number of pills, and the written instructions to make sure they are clear and accurate. Make sure that you are given enough medication to  last until your next medication refill appointment. 5. Taking Medication: Take medication as prescribed. Never take more pills than instructed. Never take medication more frequently than prescribed. Taking less pills or less frequently is permitted and encouraged, when it comes to controlled substances (written prescriptions).  6. Inform other Doctors: Always inform, all of your healthcare providers, of all the medications you take. 7. Pain Medication from other Providers: You are not allowed to accept any additional pain medication from any other Doctor or Healthcare provider. There are two exceptions to this rule. (see below) In the event that you require additional pain medication, you are responsible for notifying us, as stated below. 8. Medication Agreement: You are responsible for carefully reading and following our Medication Agreement. This must be signed before receiving any prescriptions from our practice. Safely store a copy of your signed Agreement. Violations to the Agreement will result in no further prescriptions. (Additional copies of our Medication Agreement are available upon request.) 9. Laws, Rules, & Regulations: All patients are expected to follow all Federal and State Laws, Statutes, Rules, & Regulations. Ignorance of the Laws does not constitute a valid excuse. The use of any illegal substances is prohibited. 10. Adopted CDC guidelines & recommendations: Target dosing levels will be at or below 60 MME/day. Use of benzodiazepines** is not recommended.  Exceptions: There are only two exceptions to the rule of not receiving pain medications from other Healthcare Providers. 1. Exception #1 (Emergencies): In the event of an emergency (i.e.: accident requiring emergency care), you are allowed to receive additional pain medication. However, you are responsible for: As soon as you are able, call our office [phone], at any time of the day or night, and leave a message stating your  name, the date and nature of the emergency, and the name and dose of the medication   prescribed. In the event that your call is answered by a member of our staff, make sure to document and save the date, time, and the name of the person that took your information.  2. Exception #2 (Planned Surgery): In the event that you are scheduled by another doctor or dentist to have any type of surgery or procedure, you are allowed (for a period no longer than 30 days), to receive additional pain medication, for the acute post-op pain. However, in this case, you are responsible for picking up a copy of our "Post-op Pain Management for Surgeons" handout, and giving it to your surgeon or dentist. This document is available at our office, and does not require an appointment to obtain it. Simply go to our office during business hours (Monday-Thursday from 8:00 AM to 4:00 PM) (Friday 8:00 AM to 12:00 Noon) or if you have a scheduled appointment with [place], prior to your surgery, and ask for it by name. In addition, you will need to provide [place] with your name, name of your surgeon, type of surgery, and date of procedure or surgery.  *Opioid medications include: morphine, codeine, oxycodone, oxymorphone, hydrocodone, hydromorphone, meperidine, tramadol, tapentadol, buprenorphine, fentanyl, methadone. **Benzodiazepine medications include: diazepam (Valium), alprazolam (Xanax), clonazepam (Klonopine), lorazepam (Ativan), clorazepate (Tranxene), chlordiazepoxide (Librium), estazolam (Prosom), oxazepam (Serax), temazepam (Restoril), triazolam (Halcion)  You were given 3 prescriptions for Oxycodone today. You were given one prescription for Flexeril. ____________________________________________________________________________________________

## 2016-09-08 ENCOUNTER — Other Ambulatory Visit: Payer: Self-pay | Admitting: Family Medicine

## 2016-09-10 DIAGNOSIS — Z1231 Encounter for screening mammogram for malignant neoplasm of breast: Secondary | ICD-10-CM | POA: Diagnosis not present

## 2016-09-30 DIAGNOSIS — L600 Ingrowing nail: Secondary | ICD-10-CM | POA: Diagnosis not present

## 2016-11-02 DIAGNOSIS — M2619 Other specified anomalies of jaw-cranial base relationship: Secondary | ICD-10-CM | POA: Diagnosis not present

## 2016-11-02 DIAGNOSIS — J301 Allergic rhinitis due to pollen: Secondary | ICD-10-CM | POA: Diagnosis not present

## 2016-11-02 DIAGNOSIS — J3081 Allergic rhinitis due to animal (cat) (dog) hair and dander: Secondary | ICD-10-CM | POA: Diagnosis not present

## 2016-11-02 DIAGNOSIS — Z23 Encounter for immunization: Secondary | ICD-10-CM | POA: Diagnosis not present

## 2016-11-02 DIAGNOSIS — J302 Other seasonal allergic rhinitis: Secondary | ICD-10-CM | POA: Diagnosis not present

## 2016-11-02 DIAGNOSIS — M2550 Pain in unspecified joint: Secondary | ICD-10-CM | POA: Diagnosis not present

## 2016-11-02 DIAGNOSIS — Z91038 Other insect allergy status: Secondary | ICD-10-CM | POA: Diagnosis not present

## 2016-12-07 ENCOUNTER — Other Ambulatory Visit: Payer: Self-pay | Admitting: Nurse Practitioner

## 2016-12-07 ENCOUNTER — Ambulatory Visit: Payer: 59 | Attending: Nurse Practitioner | Admitting: Nurse Practitioner

## 2016-12-07 VITALS — BP 135/76 | HR 117 | Temp 98.5°F | Resp 16 | Ht 67.0 in | Wt 170.0 lb

## 2016-12-07 DIAGNOSIS — M25562 Pain in left knee: Secondary | ICD-10-CM | POA: Diagnosis not present

## 2016-12-07 DIAGNOSIS — M4804 Spinal stenosis, thoracic region: Secondary | ICD-10-CM | POA: Diagnosis not present

## 2016-12-07 DIAGNOSIS — M25511 Pain in right shoulder: Secondary | ICD-10-CM | POA: Insufficient documentation

## 2016-12-07 DIAGNOSIS — M50123 Cervical disc disorder at C6-C7 level with radiculopathy: Secondary | ICD-10-CM | POA: Insufficient documentation

## 2016-12-07 DIAGNOSIS — M5442 Lumbago with sciatica, left side: Secondary | ICD-10-CM | POA: Diagnosis not present

## 2016-12-07 DIAGNOSIS — M961 Postlaminectomy syndrome, not elsewhere classified: Secondary | ICD-10-CM | POA: Insufficient documentation

## 2016-12-07 DIAGNOSIS — M4726 Other spondylosis with radiculopathy, lumbar region: Secondary | ICD-10-CM | POA: Diagnosis not present

## 2016-12-07 DIAGNOSIS — Z9884 Bariatric surgery status: Secondary | ICD-10-CM | POA: Insufficient documentation

## 2016-12-07 DIAGNOSIS — M5441 Lumbago with sciatica, right side: Secondary | ICD-10-CM | POA: Diagnosis not present

## 2016-12-07 DIAGNOSIS — G8929 Other chronic pain: Secondary | ICD-10-CM | POA: Diagnosis not present

## 2016-12-07 DIAGNOSIS — Z7952 Long term (current) use of systemic steroids: Secondary | ICD-10-CM | POA: Diagnosis not present

## 2016-12-07 DIAGNOSIS — Z5181 Encounter for therapeutic drug level monitoring: Secondary | ICD-10-CM | POA: Diagnosis not present

## 2016-12-07 DIAGNOSIS — H579 Unspecified disorder of eye and adnexa: Secondary | ICD-10-CM | POA: Insufficient documentation

## 2016-12-07 DIAGNOSIS — E039 Hypothyroidism, unspecified: Secondary | ICD-10-CM | POA: Diagnosis not present

## 2016-12-07 DIAGNOSIS — M62838 Other muscle spasm: Secondary | ICD-10-CM

## 2016-12-07 DIAGNOSIS — Z91013 Allergy to seafood: Secondary | ICD-10-CM | POA: Insufficient documentation

## 2016-12-07 DIAGNOSIS — F329 Major depressive disorder, single episode, unspecified: Secondary | ICD-10-CM | POA: Diagnosis not present

## 2016-12-07 DIAGNOSIS — Z91030 Bee allergy status: Secondary | ICD-10-CM | POA: Insufficient documentation

## 2016-12-07 DIAGNOSIS — Z888 Allergy status to other drugs, medicaments and biological substances status: Secondary | ICD-10-CM | POA: Insufficient documentation

## 2016-12-07 DIAGNOSIS — Z79891 Long term (current) use of opiate analgesic: Secondary | ICD-10-CM

## 2016-12-07 DIAGNOSIS — Z87891 Personal history of nicotine dependence: Secondary | ICD-10-CM | POA: Diagnosis not present

## 2016-12-07 DIAGNOSIS — M25512 Pain in left shoulder: Secondary | ICD-10-CM | POA: Diagnosis not present

## 2016-12-07 DIAGNOSIS — M25561 Pain in right knee: Secondary | ICD-10-CM

## 2016-12-07 DIAGNOSIS — Z818 Family history of other mental and behavioral disorders: Secondary | ICD-10-CM | POA: Insufficient documentation

## 2016-12-07 DIAGNOSIS — M069 Rheumatoid arthritis, unspecified: Secondary | ICD-10-CM | POA: Diagnosis not present

## 2016-12-07 DIAGNOSIS — M5124 Other intervertebral disc displacement, thoracic region: Secondary | ICD-10-CM | POA: Diagnosis not present

## 2016-12-07 DIAGNOSIS — M79641 Pain in right hand: Secondary | ICD-10-CM | POA: Insufficient documentation

## 2016-12-07 DIAGNOSIS — M5416 Radiculopathy, lumbar region: Secondary | ICD-10-CM | POA: Diagnosis not present

## 2016-12-07 DIAGNOSIS — Z881 Allergy status to other antibiotic agents status: Secondary | ICD-10-CM | POA: Insufficient documentation

## 2016-12-07 DIAGNOSIS — M4722 Other spondylosis with radiculopathy, cervical region: Secondary | ICD-10-CM | POA: Diagnosis not present

## 2016-12-07 DIAGNOSIS — G894 Chronic pain syndrome: Secondary | ICD-10-CM

## 2016-12-07 DIAGNOSIS — Z79899 Other long term (current) drug therapy: Secondary | ICD-10-CM | POA: Diagnosis not present

## 2016-12-07 DIAGNOSIS — Z882 Allergy status to sulfonamides status: Secondary | ICD-10-CM | POA: Insufficient documentation

## 2016-12-07 DIAGNOSIS — Z9049 Acquired absence of other specified parts of digestive tract: Secondary | ICD-10-CM | POA: Insufficient documentation

## 2016-12-07 DIAGNOSIS — M546 Pain in thoracic spine: Secondary | ICD-10-CM

## 2016-12-07 DIAGNOSIS — M542 Cervicalgia: Secondary | ICD-10-CM

## 2016-12-07 DIAGNOSIS — Z8249 Family history of ischemic heart disease and other diseases of the circulatory system: Secondary | ICD-10-CM | POA: Insufficient documentation

## 2016-12-07 DIAGNOSIS — G43909 Migraine, unspecified, not intractable, without status migrainosus: Secondary | ICD-10-CM | POA: Diagnosis not present

## 2016-12-07 DIAGNOSIS — F411 Generalized anxiety disorder: Secondary | ICD-10-CM | POA: Diagnosis not present

## 2016-12-07 DIAGNOSIS — K219 Gastro-esophageal reflux disease without esophagitis: Secondary | ICD-10-CM | POA: Insufficient documentation

## 2016-12-07 DIAGNOSIS — M549 Dorsalgia, unspecified: Secondary | ICD-10-CM | POA: Diagnosis present

## 2016-12-07 DIAGNOSIS — Z885 Allergy status to narcotic agent status: Secondary | ICD-10-CM | POA: Insufficient documentation

## 2016-12-07 DIAGNOSIS — Z880 Allergy status to penicillin: Secondary | ICD-10-CM | POA: Insufficient documentation

## 2016-12-07 DIAGNOSIS — E876 Hypokalemia: Secondary | ICD-10-CM | POA: Insufficient documentation

## 2016-12-07 DIAGNOSIS — Z833 Family history of diabetes mellitus: Secondary | ICD-10-CM | POA: Insufficient documentation

## 2016-12-07 MED ORDER — OXYCODONE HCL 5 MG PO TABS: 10.0000 mg | ORAL_TABLET | Freq: Four times a day (QID) | ORAL | 0 refills | Status: DC | PRN

## 2016-12-07 MED ORDER — CYCLOBENZAPRINE HCL 10 MG PO TABS: 10.0000 mg | ORAL_TABLET | Freq: Three times a day (TID) | ORAL | 0 refills | Status: DC

## 2016-12-07 NOTE — Progress Notes (Signed)
Nursing Pain Medication Assessment:  Safety precautions to be maintained throughout the outpatient stay will include: orient to surroundings, keep bed in low position, maintain call bell within reach at all times, provide assistance with transfer out of bed and ambulation.  Medication Inspection Compliance: Pill count conducted under aseptic conditions, in front of the patient. Neither the pills nor the bottle was removed from the patient's sight at any time. Once count was completed pills were immediately returned to the patient in their original bottle.  Medication: Oxycodone IR Pill/Patch Count: 109 of 240 pills remain Pill/Patch Appearance: Markings consistent with prescribed medication Bottle Appearance: Standard pharmacy container. Clearly labeled. Filled Date: 72 / 19 / 2018 Last Medication intake:  Today

## 2016-12-07 NOTE — Patient Instructions (Signed)

## 2016-12-07 NOTE — Progress Notes (Signed)
Patient's Name: Holly Spears  MRN: [id number]  Referring Provider: Judene Companion, MD  DOB: 1970/11/14  PCP: Judene Companion, MD  DOS: 12/07/2016  Note by: Vevelyn Francois NP  Service setting: Ambulatory outpatient  Specialty: Interventional Pain Management  Location: [facility] (AMB) Pain Management Facility    Patient type: Established    Primary Reason(s) for Visit: Encounter for prescription drug management. (Level of risk: moderate)  CC: Back Pain (mid)  HPI  Holly Spears is a 46 y.o. year old, female patient, who comes today for a medication management evaluation. She has Long term current use of opiate analgesic; Long term prescription opiate use; Opiate use (60 MME/Day); Encounter for therapeutic drug level monitoring; Opiate dependence ([place]); Encounter for chronic pain management; Muscle spasm; Myofascial pain; Acquired hypothyroidism; Adult hypothyroidism; Intracervical pessary; Moderate episode of recurrent major depressive disorder ([place]); Acute onset aura migraine; Rheumatoid arthritis ([place]); Musculoskeletal pain; Chronic pain syndrome; Chronic neck pain; Cervical spondylosis (C5-6 and C6-7); Cervical disc herniation (C6-7) (Left); Bulge of cervical disc without myelopathy (C5-6); Cervicogenic headache; Chronic cervical radicular pain (Bilateral) (R>L); Chronic pain of both shoulders (Bilateral) (R>L); Chronic low back pain (Location of Primary Source of Pain) (Bilateral) (R>L); Failed back surgical syndrome; 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; Lumbar spondylosis (L3-4, L4-5, and L5-S1); Lumbar bulging disc (L3-4, L4-5, and L5-S1); Chronic lumbar radicular pain (Bilateral) (R>L); Chronic knee pain (Location of Secondary source of pain) (Bilateral) (R>L); Chronic upper back pain (Left); Thoracic disc herniation (Large, Left paracentral T10-11 disc herniation); Thoracic spinal stenosis (Severe, Left, T10-11 Lateral Recess Stenosis); Thoracic foraminal stenosis (Severe Left T10-11); History of bilateral carpal  tunnel release (Bilateral); Chronic hand pain (Bilateral) (R>L); GERD (gastroesophageal reflux disease); History of psychiatric disorder; History of psychiatric care; Generalized anxiety disorder; Depression; History of shingles; History of gastric bypass; History of Roux-en-Y gastric bypass; Hypokalemia; H/O eye disorder; and Long term prescription benzodiazepine use on their problem list. Her primarily concern today is the Back Pain (mid)  Pain Assessment: Location: Mid Back Radiating: stays in the mid back Onset: More than a month ago Duration:   Quality: Aching, Constant, Sharp Severity: 3 /10 (self-reported pain score)  Note: Reported level is compatible with observation.                          Effect on ADL: pace self Timing: Constant Modifying factors: medicine and heating pad  Holly Spears was last scheduled for an appointment on 09/07/2016 for medication management. During today's appointment we reviewed Holly Spears's chronic pain status, as well as her outpatient medication regimen. She has thoracic pain. She admits that this does not radiate. She also has lower back pain . She states that she has some numbness and tingling but denies any weakness. She denies any questions or concerns.   The patient  reports that she does not use drugs. Her body mass index is 26.63 kg/m.  Further details on both, my assessment(s), as well as the proposed treatment plan, please see below.  Controlled Substance Pharmacotherapy Assessment REMS (Risk Evaluation and Mitigation Strategy)  Analgesic:Oxycodone IR 5 mg 2 tablets every 6 hours (40 mg/day of oxycodone per day) MME/day:60 mg/day    Lona Millard, RN  12/07/2016  9:23 AM  Sign at close encounter Nursing Pain Medication Assessment:  Safety precautions to be maintained throughout the outpatient stay will include: orient to surroundings, keep bed in low position, maintain call bell within reach at all times, provide assistance with transfer out  of bed and ambulation.  Medication Inspection Compliance: Pill count conducted under aseptic conditions, in front of the patient. Neither the pills nor the bottle was removed from the patient's sight at any time. Once count was completed pills were immediately returned to the patient in their original bottle.  Medication: Oxycodone IR Pill/Patch Count: 109 of 240 pills remain Pill/Patch Appearance: Markings consistent with prescribed medication Bottle Appearance: Standard pharmacy container. Clearly labeled. Filled Date: 22 / 19 / 2018 Last Medication intake:  Today   Addressed this quantity. Hx of gastric bypass and states that the Oxycodone 10 mg was not effective.  Pharmacokinetics: Liberation and absorption (onset of action): WNL Distribution (time to peak effect): WNL Metabolism and excretion (duration of action): WNL         Pharmacodynamics: Desired effects: Analgesia: Holly Spears reports >50% benefit. Functional ability: Patient reports that medication allows her to accomplish basic ADLs Clinically meaningful improvement in function (CMIF): Sustained CMIF goals met Perceived effectiveness: Described as relatively effective, allowing for increase in activities of daily living (ADL) Undesirable effects: Side-effects or Adverse reactions: None reported Monitoring: [place] PMP: Online review of the past 63-monthperiod conducted. Compliant with practice rules and regulations Last UDS on record: No results found for: SUMMARY UDS interpretation: Compliant          Medication Assessment Form: Reviewed. Patient indicates being compliant with therapy Treatment compliance: Compliant Risk Assessment Profile: Aberrant behavior: See prior evaluations. None observed or detected today Comorbid factors increasing risk of overdose: See prior notes. No additional risks detected today Risk of substance use disorder (SUD): Low Opioid Risk Tool - 12/07/16 0916      Family History of Substance Abuse    Alcohol  Negative    Illegal Drugs  Negative    Rx Drugs  Negative      Personal History of Substance Abuse   Alcohol  Negative    Illegal Drugs  Negative    Rx Drugs  Negative      Age   Age between 119-45years   No      History of Preadolescent Sexual Abuse   History of Preadolescent Sexual Abuse  Negative or Female      Psychological Disease   Psychological Disease  Negative    Depression  Positive on medication   on medication     Total Score   Opioid Risk Tool Scoring  1    Opioid Risk Interpretation  Low Risk      ORT Scoring interpretation table:  Score <3 = Low Risk for SUD  Score between 4-7 = Moderate Risk for SUD  Score >8 = High Risk for Opioid Abuse   Risk Mitigation Strategies:  Patient Counseling: Covered Patient-Prescriber Agreement (PPA): Present and active  Notification to other healthcare providers: Done  Pharmacologic Plan: No change in therapy, at this time  Laboratory Chemistry  Inflammation Markers (CRP: Acute Phase) (ESR: Chronic Phase) Lab Results  Component Value Date   CRP <0.5 12/31/2014   ESRSEDRATE 5 12/31/2014                 Renal Function Markers Lab Results  Component Value Date   BUN 7 03/27/2015   CREATININE 0.58 03/27/2015   GFRAA >60 03/27/2015   GFRNONAA >60 03/27/2015                 Hepatic Function Markers Lab Results  Component Value Date   AST 26 03/27/2015   ALT 26 03/27/2015  ALBUMIN 4.3 03/27/2015   ALKPHOS 78 03/27/2015                 Electrolytes Lab Results  Component Value Date   NA 139 03/27/2015   K 3.1 (L) 03/27/2015   CL 104 03/27/2015   CALCIUM 8.7 (L) 03/27/2015   MG 2.0 12/31/2014                 Neuropathy Markers No results found for: [id number]               Bone Pathology Markers Lab Results  Component Value Date   ALKPHOS 78 03/27/2015   VD25OH 29.3 (L) 12/31/2014   CALCIUM 8.7 (L) 03/27/2015                 Coagulation Parameters No results found for: INR, LABPROT,  APTT, PLT               Cardiovascular Markers No results found for: BNP, HGB, HCT               Note: Lab results reviewed.  Recent Diagnostic Imaging Results  No image results found.  Complexity Note: Imaging results reviewed. Results shared with Holly Spears, using Layman's terms.                         Meds   Current Outpatient Medications:  .  Biotin 10 MG CAPS, Take 1 tablet by mouth daily. , Disp: , Rfl:  .  busPIRone (BUSPAR) 5 MG tablet, Take 5 mg by mouth daily., Disp: , Rfl:  .  Calcium Carbonate-Vitamin D 600-125 MG-UNIT TABS, Take 1 tablet by mouth daily., Disp: , Rfl:  .  Cholecalciferol (VITAMIN D3) 1000 units CAPS, Take 1 capsule by mouth daily. , Disp: , Rfl:  .  citalopram (CELEXA) 20 MG tablet, Take 20 mg by mouth daily., Disp: , Rfl:  .  [START ON 12/20/2016] cyclobenzaprine (FLEXERIL) 10 MG tablet, Take 1 tablet (10 mg total) 3 (three) times daily by mouth., Disp: 270 tablet, Rfl: 0 .  diazepam (VALIUM) 5 MG tablet, Take 5 mg by mouth as needed. , Disp: , Rfl: 1 .  EPINEPHrine 0.3 mg/0.3 mL IJ SOAJ injection, Frequency:PRN   Dosage:0.0     Instructions:  Note:Dose: 0.3MG/0.3, Disp: , Rfl:  .  hydroxychloroquine (PLAQUENIL) 200 MG tablet, Take 200 mg by mouth 2 (two) times daily., Disp: , Rfl:  .  levonorgestrel (MIRENA) 20 MCG/24HR IUD, by Intrauterine route., Disp: , Rfl:  .  Multiple Vitamin (MULTI-VITAMINS) TABS, Take 1 tablet by mouth 3 (three) times daily. , Disp: , Rfl:  .  NATURE-THROID 48.75 MG TABS, Take 48.75 mg by mouth daily., Disp: , Rfl:  .  [START ON 01/19/2017] oxyCODONE (OXY IR/ROXICODONE) 5 MG immediate release tablet, Take 2 tablets (10 mg total) every 6 (six) hours as needed by mouth for severe pain., Disp: 240 tablet, Rfl: 0 .  potassium chloride SA (K-DUR,KLOR-CON) 20 MEQ tablet, Take 20 mEq daily by mouth. , Disp: , Rfl:  .  predniSONE (DELTASONE) 5 MG tablet, Take 2.5 mg by mouth daily with breakfast. , Disp: , Rfl:  .  ranitidine (ZANTAC)  150 MG tablet, Take 150 mg by mouth 2 (two) times daily. , Disp: , Rfl:  .  buPROPion (WELLBUTRIN) 100 MG tablet, Take 200 mg 2 (two) times daily by mouth. , Disp: , Rfl:  .  [START ON 02/18/2017] oxyCODONE (OXY IR/ROXICODONE) 5  MG immediate release tablet, Take 2 tablets (10 mg total) every 6 (six) hours as needed by mouth for severe pain., Disp: 240 tablet, Rfl: 0 .  [START ON 12/20/2016] oxyCODONE (OXY IR/ROXICODONE) 5 MG immediate release tablet, Take 2 tablets (10 mg total) every 6 (six) hours as needed by mouth for severe pain., Disp: 240 tablet, Rfl: 0  ROS  Constitutional: Denies any fever or chills Gastrointestinal: No reported hemesis, hematochezia, vomiting, or acute GI distress Musculoskeletal: Denies any acute onset joint swelling, redness, loss of ROM, or weakness Neurological: No reported episodes of acute onset apraxia, aphasia, dysarthria, agnosia, amnesia, paralysis, loss of coordination, or loss of consciousness  Allergies  Holly Spears is allergic to bee venom; honey bee venom; penicillins; shellfish allergy; shellfish-derived products; sulfa antibiotics; erythromycin; sulfamethizole; zaleplon; codeine; erythromycin base; and trazodone.  PFSH  Drug: Holly Spears  reports that she does not use drugs. Alcohol:  reports that she does not drink alcohol. Tobacco:  reports that she has quit smoking. she has never used smokeless tobacco. Medical:  has a past medical history of Acid reflux (12/15/2011), Anxiety, generalized (12/15/2011), Arthritis, Cervical pain (12/15/2011), Depression, GERD (gastroesophageal reflux disease), Hiatal hernia, LBP (low back pain) (12/15/2011), Migraine, Muscle ache (12/15/2011), Pain (12/27/2012), Rheumatoid arthritis ([place]) (12/15/2011), and Scratched cornea (10/25/2015). Surgical: Holly Spears  has a past surgical history that includes Cholecystectomy. Family: family history includes Depression in her mother; Diabetes in her father; Hyperlipidemia in her  mother; Hypertension in her father and mother.  Constitutional Exam  General appearance: Well nourished, well developed, and well hydrated. In no apparent acute distress Vitals:   12/07/16 0909  BP: 135/76  Pulse: (!) 117  Resp: 16  Temp: 98.5 F (36.9 C)  SpO2: 100%  Weight: 170 lb (77.1 kg)  Height: 5' 7"  (1.702 m)   BMI Assessment: Estimated body mass index is 26.63 kg/m as calculated from the following:   Height as of this encounter: 5' 7"  (1.702 m).   Weight as of this encounter: 170 lb (77.1 kg). Psych/Mental status: Alert, oriented x 3 (person, place, & time)       Eyes: PERLA Respiratory: No evidence of acute respiratory distress  Cervical Spine Area Exam  Skin & Axial Inspection: No masses, redness, edema, swelling, or associated skin lesions Alignment: Symmetrical Functional ROM: Unrestricted ROM      Stability: No instability detected Muscle Tone/Strength: Functionally intact. No obvious neuro-muscular anomalies detected. Sensory (Neurological): Unimpaired Palpation: No palpable anomalies              Upper Extremity (UE) Exam    Side: Right upper extremity  Side: Left upper extremity  Skin & Extremity Inspection: Skin color, temperature, and hair growth are WNL. No peripheral edema or cyanosis. No masses, redness, swelling, asymmetry, or associated skin lesions. No contractures.  Skin & Extremity Inspection: Skin color, temperature, and hair growth are WNL. No peripheral edema or cyanosis. No masses, redness, swelling, asymmetry, or associated skin lesions. No contractures.  Functional ROM: Unrestricted ROM          Functional ROM: Unrestricted ROM          Muscle Tone/Strength: Functionally intact. No obvious neuro-muscular anomalies detected.  Muscle Tone/Strength: Functionally intact. No obvious neuro-muscular anomalies detected.  Sensory (Neurological): Unimpaired          Sensory (Neurological): Unimpaired          Palpation: No palpable anomalies  Palpation: No palpable anomalies              Specialized Test(s): Deferred         Specialized Test(s): Deferred          Thoracic Spine Area Exam  Skin & Axial Inspection: No masses, redness, or swelling Alignment: Symmetrical Functional ROM: Unrestricted ROM Stability: No instability detected Muscle Tone/Strength: Functionally intact. No obvious neuro-muscular anomalies detected. Sensory (Neurological): Unimpaired Muscle strength & Tone: No palpable anomalies  Lumbar Spine Area Exam  Skin & Axial Inspection: No masses, redness, or swelling Alignment: Symmetrical Functional ROM: Unrestricted ROM      Stability: No instability detected Muscle Tone/Strength: Functionally intact. No obvious neuro-muscular anomalies detected. Sensory (Neurological): Unimpaired Palpation: Complains of area being tender to palpation       Provocative Tests: Lumbar Hyperextension and rotation test: evaluation deferred today       Lumbar Lateral bending test: evaluation deferred today       Patrick's Maneuver: evaluation deferred today                    Gait & Posture Assessment  Ambulation: Unassisted Gait: Relatively normal for age and body habitus Posture: WNL   Lower Extremity Exam    Side: Right lower extremity  Side: Left lower extremity  Skin & Extremity Inspection: Skin color, temperature, and hair growth are WNL. No peripheral edema or cyanosis. No masses, redness, swelling, asymmetry, or associated skin lesions. No contractures.  Skin & Extremity Inspection: Skin color, temperature, and hair growth are WNL. No peripheral edema or cyanosis. No masses, redness, swelling, asymmetry, or associated skin lesions. No contractures.  Functional ROM: Unrestricted ROM          Functional ROM: Unrestricted ROM          Muscle Tone/Strength: Functionally intact. No obvious neuro-muscular anomalies detected.  Muscle Tone/Strength: Functionally intact. No obvious neuro-muscular anomalies detected.  Sensory  (Neurological): Unimpaired  Sensory (Neurological): Unimpaired  Palpation: No palpable anomalies  Palpation: No palpable anomalies   Assessment  Primary Diagnosis & Pertinent Problem List: The primary encounter diagnosis was Chronic low back pain (Location of Primary Source of Pain) (Bilateral) (R>L). Diagnoses of Chronic lumbar radicular pain (Bilateral) (R>L), Chronic upper back pain (Left), Chronic neck pain, Chronic knee pain (Location of Secondary source of pain) (Bilateral) (R>L), Chronic pain syndrome, Muscle spasm, Long term prescription benzodiazepine use, and Long term current use of opiate analgesic were also pertinent to this visit.  Status Diagnosis  Controlled Controlled Controlled 1. Chronic low back pain (Location of Primary Source of Pain) (Bilateral) (R>L)   2. Chronic lumbar radicular pain (Bilateral) (R>L)   3. Chronic upper back pain (Left)   4. Chronic neck pain   5. Chronic knee pain (Location of Secondary source of pain) (Bilateral) (R>L)   6. Chronic pain syndrome   7. Muscle spasm   8. Long term prescription benzodiazepine use   9. Long term current use of opiate analgesic     Problems updated and reviewed during this visit: No problems updated. Plan of Care  Pharmacotherapy (Medications Ordered): Meds ordered this encounter  Medications  . oxyCODONE (OXY IR/ROXICODONE) 5 MG immediate release tablet    Sig: Take 2 tablets (10 mg total) every 6 (six) hours as needed by mouth for severe pain.    Dispense:  240 tablet    Refill:  0    Do not place this medication, or any other prescription from our practice, on "  Automatic Refill". Patient may have prescription filled one day early if pharmacy is closed on scheduled refill date. Do not fill until:01/19/2017 To last until:02/18/2017    Order Specific Question:   Supervising Provider    Answer:   Milinda Pointer [phone]  . oxyCODONE (OXY IR/ROXICODONE) 5 MG immediate release tablet    Sig: Take 2 tablets (10  mg total) every 6 (six) hours as needed by mouth for severe pain.    Dispense:  240 tablet    Refill:  0    Do not place this medication, or any other prescription from our practice, on "Automatic Refill". Patient may have prescription filled one day early if pharmacy is closed on scheduled refill date. Do not fill until:02/18/2017 To last until: 03/20/2017    Order Specific Question:   Supervising Provider    Answer:   Milinda Pointer [phone]  . oxyCODONE (OXY IR/ROXICODONE) 5 MG immediate release tablet    Sig: Take 2 tablets (10 mg total) every 6 (six) hours as needed by mouth for severe pain.    Dispense:  240 tablet    Refill:  0    Do not place this medication, or any other prescription from our practice, on "Automatic Refill". Patient may have prescription filled one day early if pharmacy is closed on scheduled refill date. Do not fill until: 12/20/2016 To last until: 01/19/2017    Order Specific Question:   Supervising Provider    Answer:   Milinda Pointer [phone]  . cyclobenzaprine (FLEXERIL) 10 MG tablet    Sig: Take 1 tablet (10 mg total) 3 (three) times daily by mouth.    Dispense:  270 tablet    Refill:  0    Do not place this medication, or any other prescription from our practice, on "Automatic Refill". Patient may have prescription filled one day early if pharmacy is closed on scheduled refill date. Fill date: 09/21/2016    Order Specific Question:   Supervising Provider    Answer:   Milinda Pointer [phone]  This SmartLink is deprecated. Use AVSMEDLIST instead to display the medication list for a patient. Medications administered today: Dawnna Wisman had no medications administered during this visit. Lab-work, procedure(s), and/or referral(s): Orders Placed This Encounter  Procedures  . DG Lumbar Spine Complete W/Bend  . DG Thoracic Spine 2 View  . ToxASSURE Select 13 (MW), Urine   Imaging and/or referral(s): DG LUMBAR SPINE COMPLETE W/BEND 6+V DG THORACIC  SPINE 2 VIEW  Interventional therapies: Planned, scheduled, and/or pending:   Not at this time.     Considering:   Diagnostic bilateral lumbar facet block  Possible bilateral lumbar facet radiofrequency ablation  Diagnostic right-sided L5-S1 lumbar epidural steroid injection  Possible right-sided L5-S1 versus S1 transforaminal epidural steroid injection  Diagnostic bilateral intra-articular knee injections with local anesthetic and steroid  Possible series of 5 intra-articular Hyalgan knee injections  Diagnostic bilateral Genicular nerve block  Possible bilateral Genicular nerve radiofrequency ablation    Palliative PRN treatment(s):   Diagnostic bilateral lumbar facet block  Diagnostic right-sided L5-S1 lumbar epidural steroid injection  Diagnostic bilateral intra-articular knee injections with local anesthetic and steroid  Diagnostic bilateral Genicular nerve block    Provider-requested follow-up: Return in about 3 months (around 03/09/2017) for MedMgmt.  Future Appointments  Date Time Provider [place]  03/01/2017 12:45 PM Vevelyn Francois, NP [facility] None   Primary Care Physician: Judene Companion, MD Location: [facility] Note by: Vevelyn Francois NP Date: 12/07/2016;  Time: 12:48 PM  Pain Score Disclaimer: We use the NRS-11 scale. This is a self-reported, subjective measurement of pain severity with only modest accuracy. It is used primarily to identify changes within a particular patient. It must be understood that outpatient pain scales are significantly less accurate that those used for research, where they can be applied under ideal controlled circumstances with minimal exposure to variables. In reality, the score is likely to be a combination of pain intensity and pain affect, where pain affect describes the degree of emotional arousal or changes in action readiness caused by the sensory experience of pain. Factors such as social and  work situation, setting, emotional state, anxiety levels, expectation, and prior pain experience may influence pain perception and show large inter-individual differences that may also be affected by time variables.  Patient instructions provided during this appointment: Patient Instructions   ____________________________________________________________________________________________  Medication Rules  Applies to: All patients receiving prescriptions (written or electronic).  Pharmacy of record: Pharmacy where electronic prescriptions will be sent. If written prescriptions are taken to a different pharmacy, please inform the nursing staff. The pharmacy listed in the electronic medical record should be the one where you would like electronic prescriptions to be sent.  Prescription refills: Only during scheduled appointments. Applies to both, written and electronic prescriptions.  NOTE: The following applies primarily to controlled substances (Opioid* Pain Medications).   Patient's responsibilities: 1. Pain Pills: Bring all pain pills to every appointment (except for procedure appointments). 2. Pill Bottles: Bring pills in original pharmacy bottle. Always bring newest bottle. Bring bottle, even if empty. 3. Medication refills: You are responsible for knowing and keeping track of what medications you need refilled. The day before your appointment, write a list of all prescriptions that need to be refilled. Bring that list to your appointment and give it to the admitting nurse. Prescriptions will be written only during appointments. If you forget a medication, it will not be "Called in", "Faxed", or "electronically sent". You will need to get another appointment to get these prescribed. 4. Prescription Accuracy: You are responsible for carefully inspecting your prescriptions before leaving our office. Have the discharge nurse carefully go over each prescription with you, before taking them home.  Make sure that your name is accurately spelled, that your address is correct. Check the name and dose of your medication to make sure it is accurate. Check the number of pills, and the written instructions to make sure they are clear and accurate. Make sure that you are given enough medication to last until your next medication refill appointment. 5. Taking Medication: Take medication as prescribed. Never take more pills than instructed. Never take medication more frequently than prescribed. Taking less pills or less frequently is permitted and encouraged, when it comes to controlled substances (written prescriptions).  6. Inform other Doctors: Always inform, all of your healthcare providers, of all the medications you take. 7. Pain Medication from other Providers: You are not allowed to accept any additional pain medication from any other Doctor or Healthcare provider. There are two exceptions to this rule. (see below) In the event that you require additional pain medication, you are responsible for notifying us, as stated below. 8. Medication Agreement: You are responsible for carefully reading and following our Medication Agreement. This must be signed before receiving any prescriptions from our practice. Safely store a copy of your signed Agreement. Violations to the Agreement will result in no further prescriptions. (Additional copies of our Medication Agreement are available upon  request.) 9. Laws, Rules, & Regulations: All patients are expected to follow all Federal and [employer], [employer], Rules, [employer]. Ignorance of the Laws does not constitute a valid excuse. The use of any illegal substances is prohibited. 10. Adopted CDC guidelines & recommendations: Target dosing levels will be at or below 60 MME/day. Use of benzodiazepines** is not recommended.  Exceptions: There are only two exceptions to the rule of not receiving pain medications from other Healthcare Providers. 1. Exception #1  (Emergencies): In the event of an emergency (i.e.: accident requiring emergency care), you are allowed to receive additional pain medication. However, you are responsible for: As soon as you are able, call our office [phone], at any time of the day or night, and leave a message stating your name, the date and nature of the emergency, and the name and dose of the medication prescribed. In the event that your call is answered by a member of our staff, make sure to document and save the date, time, and the name of the person that took your information.  2. Exception #2 (Planned Surgery): In the event that you are scheduled by another doctor or dentist to have any type of surgery or procedure, you are allowed (for a period no longer than 30 days), to receive additional pain medication, for the acute post-op pain. However, in this case, you are responsible for picking up a copy of our "Post-op Pain Management for Surgeons" handout, and giving it to your surgeon or dentist. This document is available at our office, and does not require an appointment to obtain it. Simply go to our office during business hours (Monday-Thursday from 8:00 AM to 4:00 PM) (Friday 8:00 AM to 12:00 Noon) or if you have a scheduled appointment with [place], prior to your surgery, and ask for it by name. In addition, you will need to provide [place] with your name, name of your surgeon, type of surgery, and date of procedure or surgery.  *Opioid medications include: morphine, codeine, oxycodone, oxymorphone, hydrocodone, hydromorphone, meperidine, tramadol, tapentadol, buprenorphine, fentanyl, methadone. **Benzodiazepine medications include: diazepam (Valium), alprazolam (Xanax), clonazepam (Klonopine), lorazepam (Ativan), clorazepate (Tranxene), chlordiazepoxide (Librium), estazolam (Prosom), oxazepam (Serax), temazepam (Restoril), triazolam  (Halcion)  ____________________________________________________________________________________________

## 2016-12-12 LAB — TOXASSURE SELECT 13 (MW), URINE

## 2017-02-18 ENCOUNTER — Ambulatory Visit
Admission: RE | Admit: 2017-02-18 | Discharge: 2017-02-18 | Disposition: A | Discharge status: Home / Self Care | Payer: 59 | Source: Ambulatory Visit | Attending: Nurse Practitioner | Admitting: Nurse Practitioner

## 2017-02-18 DIAGNOSIS — G8929 Other chronic pain: Secondary | ICD-10-CM | POA: Diagnosis not present

## 2017-02-18 DIAGNOSIS — M47814 Spondylosis without myelopathy or radiculopathy, thoracic region: Secondary | ICD-10-CM | POA: Insufficient documentation

## 2017-02-18 DIAGNOSIS — M5416 Radiculopathy, lumbar region: Secondary | ICD-10-CM | POA: Diagnosis not present

## 2017-02-18 DIAGNOSIS — M4854XA Collapsed vertebra, not elsewhere classified, thoracic region, initial encounter for fracture: Secondary | ICD-10-CM | POA: Insufficient documentation

## 2017-02-18 DIAGNOSIS — M47816 Spondylosis without myelopathy or radiculopathy, lumbar region: Secondary | ICD-10-CM | POA: Diagnosis not present

## 2017-02-18 DIAGNOSIS — M546 Pain in thoracic spine: Secondary | ICD-10-CM

## 2017-02-23 NOTE — Progress Notes (Signed)
Results were reviewed and found to be: mildly abnormal  No acute injury or pathology identified  Review would suggest interventional pain management techniques may be of benefit 

## 2017-03-01 ENCOUNTER — Other Ambulatory Visit: Payer: Self-pay

## 2017-03-01 ENCOUNTER — Ambulatory Visit: Payer: 59 | Attending: Nurse Practitioner | Admitting: Nurse Practitioner

## 2017-03-01 ENCOUNTER — Encounter: Payer: Self-pay | Admitting: Nurse Practitioner

## 2017-03-01 VITALS — BP 130/83 | HR 98 | Temp 97.8°F | Resp 16 | Ht 66.0 in | Wt 175.0 lb

## 2017-03-01 DIAGNOSIS — M069 Rheumatoid arthritis, unspecified: Secondary | ICD-10-CM | POA: Insufficient documentation

## 2017-03-01 DIAGNOSIS — M5442 Lumbago with sciatica, left side: Secondary | ICD-10-CM

## 2017-03-01 DIAGNOSIS — Z9884 Bariatric surgery status: Secondary | ICD-10-CM | POA: Diagnosis not present

## 2017-03-01 DIAGNOSIS — K449 Diaphragmatic hernia without obstruction or gangrene: Secondary | ICD-10-CM | POA: Insufficient documentation

## 2017-03-01 DIAGNOSIS — Z79891 Long term (current) use of opiate analgesic: Secondary | ICD-10-CM | POA: Diagnosis not present

## 2017-03-01 DIAGNOSIS — E876 Hypokalemia: Secondary | ICD-10-CM | POA: Insufficient documentation

## 2017-03-01 DIAGNOSIS — H579 Unspecified disorder of eye and adnexa: Secondary | ICD-10-CM | POA: Diagnosis not present

## 2017-03-01 DIAGNOSIS — M79602 Pain in left arm: Secondary | ICD-10-CM | POA: Insufficient documentation

## 2017-03-01 DIAGNOSIS — M50123 Cervical disc disorder at C6-C7 level with radiculopathy: Secondary | ICD-10-CM | POA: Diagnosis not present

## 2017-03-01 DIAGNOSIS — K219 Gastro-esophageal reflux disease without esophagitis: Secondary | ICD-10-CM | POA: Insufficient documentation

## 2017-03-01 DIAGNOSIS — F1120 Opioid dependence, uncomplicated: Secondary | ICD-10-CM | POA: Insufficient documentation

## 2017-03-01 DIAGNOSIS — M4804 Spinal stenosis, thoracic region: Secondary | ICD-10-CM | POA: Insufficient documentation

## 2017-03-01 DIAGNOSIS — M79642 Pain in left hand: Secondary | ICD-10-CM | POA: Diagnosis not present

## 2017-03-01 DIAGNOSIS — G8929 Other chronic pain: Secondary | ICD-10-CM | POA: Diagnosis not present

## 2017-03-01 DIAGNOSIS — Z8619 Personal history of other infectious and parasitic diseases: Secondary | ICD-10-CM | POA: Diagnosis not present

## 2017-03-01 DIAGNOSIS — F339 Major depressive disorder, recurrent, unspecified: Secondary | ICD-10-CM | POA: Insufficient documentation

## 2017-03-01 DIAGNOSIS — G5603 Carpal tunnel syndrome, bilateral upper limbs: Secondary | ICD-10-CM | POA: Insufficient documentation

## 2017-03-01 DIAGNOSIS — G894 Chronic pain syndrome: Secondary | ICD-10-CM | POA: Diagnosis not present

## 2017-03-01 DIAGNOSIS — M4854XA Collapsed vertebra, not elsewhere classified, thoracic region, initial encounter for fracture: Secondary | ICD-10-CM | POA: Insufficient documentation

## 2017-03-01 DIAGNOSIS — M4722 Other spondylosis with radiculopathy, cervical region: Secondary | ICD-10-CM | POA: Diagnosis not present

## 2017-03-01 DIAGNOSIS — E039 Hypothyroidism, unspecified: Secondary | ICD-10-CM | POA: Insufficient documentation

## 2017-03-01 DIAGNOSIS — F411 Generalized anxiety disorder: Secondary | ICD-10-CM | POA: Insufficient documentation

## 2017-03-01 DIAGNOSIS — M79601 Pain in right arm: Secondary | ICD-10-CM | POA: Insufficient documentation

## 2017-03-01 DIAGNOSIS — M7918 Myalgia, other site: Secondary | ICD-10-CM | POA: Insufficient documentation

## 2017-03-01 DIAGNOSIS — G9619 Other disorders of meninges, not elsewhere classified: Secondary | ICD-10-CM | POA: Insufficient documentation

## 2017-03-01 DIAGNOSIS — M542 Cervicalgia: Secondary | ICD-10-CM | POA: Insufficient documentation

## 2017-03-01 DIAGNOSIS — M4726 Other spondylosis with radiculopathy, lumbar region: Secondary | ICD-10-CM | POA: Diagnosis not present

## 2017-03-01 DIAGNOSIS — M79641 Pain in right hand: Secondary | ICD-10-CM | POA: Insufficient documentation

## 2017-03-01 DIAGNOSIS — M546 Pain in thoracic spine: Secondary | ICD-10-CM | POA: Diagnosis present

## 2017-03-01 DIAGNOSIS — M5416 Radiculopathy, lumbar region: Secondary | ICD-10-CM | POA: Diagnosis not present

## 2017-03-01 DIAGNOSIS — M5116 Intervertebral disc disorders with radiculopathy, lumbar region: Secondary | ICD-10-CM | POA: Insufficient documentation

## 2017-03-01 DIAGNOSIS — M5412 Radiculopathy, cervical region: Secondary | ICD-10-CM | POA: Diagnosis not present

## 2017-03-01 DIAGNOSIS — M62838 Other muscle spasm: Secondary | ICD-10-CM

## 2017-03-01 DIAGNOSIS — M5441 Lumbago with sciatica, right side: Secondary | ICD-10-CM | POA: Diagnosis not present

## 2017-03-01 DIAGNOSIS — Z79899 Other long term (current) drug therapy: Secondary | ICD-10-CM | POA: Insufficient documentation

## 2017-03-01 MED ORDER — OXYCODONE HCL 5 MG PO TABS: 10.0000 mg | ORAL_TABLET | Freq: Four times a day (QID) | ORAL | 0 refills | Status: DC | PRN

## 2017-03-01 MED ORDER — CYCLOBENZAPRINE HCL 10 MG PO TABS
10.0000 mg | ORAL_TABLET | Freq: Three times a day (TID) | ORAL | 0 refills | Status: DC
Start: 2017-03-01 — End: 2017-05-27

## 2017-03-01 NOTE — Progress Notes (Signed)
Nursing Pain Medication Assessment:  Safety precautions to be maintained throughout the outpatient stay will include: orient to surroundings, keep bed in low position, maintain call bell within reach at all times, provide assistance with transfer out of bed and ambulation.  Medication Inspection Compliance: Pill count conducted under aseptic conditions, in front of the patient. Neither the pills nor the bottle was removed from the patient's sight at any time. Once count was completed pills were immediately returned to the patient in their original bottle.  Medication: Oxycodone IR Pill/Patch Count: 156 of 240 pills remain Pill/Patch Appearance: Markings consistent with prescribed medication Bottle Appearance: Standard pharmacy container. Clearly labeled. Filled Date: 1 / 17 / 2019 Last Medication intake:  Today

## 2017-03-01 NOTE — Progress Notes (Signed)
Patient's Name: Holly Spears  MRN: [id number]  Referring Provider: Judene Companion, MD  DOB: 12/29/1970  PCP: Holly Companion, MD  DOS: 03/01/2017  Note by: Holly Francois NP  Service setting: Ambulatory outpatient  Specialty: Interventional Pain Management  Location: [facility] (AMB) Pain Management Facility    Patient type: Established    Primary Reason(s) for Visit: Encounter for prescription drug management. (Level of risk: moderate)  CC: Back Pain (upper, mid); Arm Pain (bilaterally); and Hand Pain (bilaterally including all fingers)  HPI  Holly Spears is a 47 y.o. year old, female patient, who comes today for a medication management evaluation. She has Long term current use of opiate analgesic; Long term prescription opiate use; Opiate use (60 MME/Day); Encounter for therapeutic drug level monitoring; Opiate dependence ([place]); Encounter for chronic pain management; Muscle spasm; Myofascial pain; Acquired hypothyroidism; Adult hypothyroidism; Intracervical pessary; Moderate episode of recurrent major depressive disorder ([place]); Acute onset aura migraine; Rheumatoid arthritis ([place]); Musculoskeletal pain; Chronic pain syndrome; Chronic neck pain; Cervical spondylosis (C5-6 and C6-7); Cervical disc herniation (C6-7) (Left); Bulge of cervical disc without myelopathy (C5-6); Cervicogenic headache; Chronic cervical radicular pain (Bilateral) (R>L); Chronic pain of both shoulders (Bilateral) (R>L); Chronic low back pain (Location of Primary Source of Pain) (Bilateral) (R>L); Failed back surgical syndrome; 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; Lumbar spondylosis (L3-4, L4-5, and L5-S1); Lumbar bulging disc (L3-4, L4-5, and L5-S1); Chronic lumbar radicular pain (Bilateral) (R>L); Chronic knee pain (Location of Secondary source of pain) (Bilateral) (R>L); Chronic upper back pain (Left); Thoracic disc herniation (Large, Left paracentral T10-11 disc herniation); Thoracic spinal stenosis (Severe, Left, T10-11 Lateral Recess  Stenosis); Thoracic foraminal stenosis (Severe Left T10-11); History of bilateral carpal tunnel release (Bilateral); Chronic hand pain (Bilateral) (R>L); GERD (gastroesophageal reflux disease); History of psychiatric disorder; History of psychiatric care; Generalized anxiety disorder; Depression; History of shingles; History of gastric bypass; History of Roux-en-Y gastric bypass; Hypokalemia; H/O eye disorder; and Long term prescription benzodiazepine use on their problem list. Her primarily concern today is the Back Pain (upper, mid); Arm Pain (bilaterally); and Hand Pain (bilaterally including all fingers)  Pain Assessment: Location: Upper, Mid Back Radiating: sometimes moves through shoulders to arms, to hands including all fingers Onset: More than a month ago Duration: Chronic pain Quality: Sharp, Constant Severity: 1 /10 (self-reported pain score)  Note: Reported level is compatible with observation.                          Effect on ADL: no longer works because of pain Timing: Constant Modifying factors: medications, heat, changing positions  Holly Spears was last scheduled for an appointment on 12/07/2016 for medication management. During today's appointment we reviewed Holly Spears's chronic pain status, as well as her outpatient medication regimen. She admits that her pain is stable. She admits that her RA maybe in more areas. She admits that she has failed all treatments except the Plaquenil but she does not want to try this.   The patient  reports that she does not use drugs. Her body mass index is 28.25 kg/m.  Further details on both, my assessment(s), as well as the proposed treatment plan, please see below.  Controlled Substance Pharmacotherapy Assessment REMS (Risk Evaluation and Mitigation Strategy)  Analgesic:Oxycodone IR 5 mg 2 tablets every 6 hours (40 mg/day of oxycodone per day) MME/day:60 mg/day    Holly Spears  03/01/2017 12:30 PM  Signed Nursing Pain Medication  Assessment:  Safety precautions to be maintained throughout the  outpatient stay will include: orient to surroundings, keep bed in low position, maintain call bell within reach at all times, provide assistance with transfer out of bed and ambulation.  Medication Inspection Compliance: Pill count conducted under aseptic conditions, in front of the patient. Neither the pills nor the bottle was removed from the patient's sight at any time. Once count was completed pills were immediately returned to the patient in their original bottle.  Medication: Oxycodone IR Pill/Patch Count: 156 of 240 pills remain Pill/Patch Appearance: Markings consistent with prescribed medication Bottle Appearance: Standard pharmacy container. Clearly labeled. Filled Date: 1 / 17 / 2019 Last Medication intake:  Today   Pharmacokinetics: Liberation and absorption (onset of action): WNL Distribution (time to peak effect): WNL Metabolism and excretion (duration of action): WNL         Pharmacodynamics: Desired effects: Analgesia: Holly Spears reports >50% benefit. Functional ability: Patient reports that medication allows her to accomplish basic ADLs Clinically meaningful improvement in function (CMIF): Sustained CMIF goals met Perceived effectiveness: Described as relatively effective, allowing for increase in activities of daily living (ADL) Undesirable effects: Side-effects or Adverse reactions: None reported Monitoring: [place] PMP: Online review of the past 22-monthperiod conducted. Compliant with practice rules and regulations Last UDS on record: Summary  Date Value Ref Range Status  12/07/2016 FINAL  Final    Comment:    ==================================================================== TOXASSURE SELECT 13 (MW) ==================================================================== Test                             Result       Flag       Units Drug Present and Declared for Prescription Verification   Oxycodone                       597          EXPECTED   ng/mg creat   Oxymorphone                    192          EXPECTED   ng/mg creat   Noroxycodone                   3403         EXPECTED   ng/mg creat    Sources of oxycodone include scheduled prescription medications.    Oxymorphone and noroxycodone are expected metabolites of    oxycodone. Oxymorphone is also available as a scheduled    prescription medication. Drug Absent but Declared for Prescription Verification   Diazepam                       Not Detected UNEXPECTED ng/mg creat ==================================================================== Test                      Result    Flag   Units      Ref Range   Creatinine              37               mg/dL      >=20 ==================================================================== Declared Medications:  The flagging and interpretation on this report are based on the  following declared medications.  Unexpected results may arise from  inaccuracies in the declared medications.  **Note: The testing scope of this panel includes these medications:  Diazepam (Valium)  Oxycodone  **Note: The testing scope of this panel does not include following  reported medications:  Bupropion  Buspirone  Calcium Carbonate (Calcium Carb/Vitamin D)  Citalopram (Celexa)  Cyclobenzaprine (Flexeril)  Epinephrine (EpiPen)  Hydroxychloroquine (Plaquenil)  Levonorgestrel (Mirena)  Multivitamin  Potassium (K-Dur)  Prednisone (Deltasone)  Ranitidine (Zantac)  Supplement  Vitamin B (Biotin)  Vitamin D (Calcium Carb/Vitamin D)  Vitamin D3 ==================================================================== For clinical consultation, please call [phone]. ====================================================================    UDS interpretation: Compliant          Medication Assessment Form: Reviewed. Patient indicates being compliant with therapy Treatment compliance: Compliant Risk Assessment  Profile: Aberrant behavior: See prior evaluations. None observed or detected today Comorbid factors increasing risk of overdose: See prior notes. No additional risks detected today Risk of substance use disorder (SUD): Low Opioid Risk Tool - 03/01/17 1221      Family History of Substance Abuse   Alcohol  Negative    Illegal Drugs  Negative    Rx Drugs  Negative      Personal History of Substance Abuse   Alcohol  Negative    Illegal Drugs  Negative    Rx Drugs  Negative      Age   Age between 22-45 years   No      History of Preadolescent Sexual Abuse   History of Preadolescent Sexual Abuse  Negative or Female      Psychological Disease   Psychological Disease  Negative    Depression  Positive      Total Score   Opioid Risk Tool Scoring  1    Opioid Risk Interpretation  Low Risk      ORT Scoring interpretation table:  Score <3 = Low Risk for SUD  Score between 4-7 = Moderate Risk for SUD  Score >8 = High Risk for Opioid Abuse   Risk Mitigation Strategies:  Patient Counseling: Covered Patient-Prescriber Agreement (PPA): Present and active  Notification to other healthcare providers: Done  Pharmacologic Plan: No change in therapy, at this time.             Laboratory Chemistry  Inflammation Markers (CRP: Acute Phase) (ESR: Chronic Phase) Lab Results  Component Value Date   CRP <0.5 12/31/2014   ESRSEDRATE 5 12/31/2014                 Rheumatology Markers No results found for: Elayne Guerin, [facility]              Renal Function Markers Lab Results  Component Value Date   BUN 7 03/27/2015   CREATININE 0.58 03/27/2015   GFRAA >60 03/27/2015   GFRNONAA >60 03/27/2015                 Hepatic Function Markers Lab Results  Component Value Date   AST 26 03/27/2015   ALT 26 03/27/2015   ALBUMIN 4.3 03/27/2015   ALKPHOS 78 03/27/2015                 Electrolytes Lab Results  Component Value Date   NA 139 03/27/2015   K 3.1 (L)  03/27/2015   CL 104 03/27/2015   CALCIUM 8.7 (L) 03/27/2015   MG 2.0 12/31/2014                 Neuropathy Markers No results found for: VITAMINB12, FOLATE, HGBA1C, HIV               Bone Pathology Markers Lab Results  Component Value Date   VD25OH 29.3 (L) 12/31/2014                 Coagulation Parameters No results found for: INR, LABPROT, APTT, PLT, DDIMER               Cardiovascular Markers No results found for: BNP, CKTOTAL, CKMB, TROPONINI, HGB, HCT               CA Markers No results found for: CEA, CA125, LABCA2               Note: Lab results reviewed.  Recent Diagnostic Imaging Results  DG Lumbar Spine Complete W/Bend CLINICAL DATA:  Chronic pain, no injury Please comment in the report about spinal instability (>4 mm displacement), in addition to any: Spondylolisthesis (specify retro- or antero- and Grade level) (describe displacement in millimeters )  EXAM: LUMBAR SPINE - COMPLETE WITH BENDING VIEWS  COMPARISON:  None.  FINDINGS: There is mild degenerative change in the facet joints of the lower lumbar spine. No acute fracture or subluxation. Surgical clips are noted in the right upper quadrant the abdomen. Intrauterine device overlies the central pelvis.  IMPRESSION: Mild lower lumbar degenerative changes.  No spondylolisthesis.  Electronically Signed   By: Nolon Nations M.D.   On: 02/18/2017 15:47 DG Thoracic Spine 2 View CLINICAL DATA:  Chronic pain, no injury Please comment in the report about spinal instability (>4 mm displacement), in addition to any: Spondylolisthesis (specify retro- or antero- and Grade level) (describe displacement in millimeters);  EXAM: THORACIC SPINE 2 VIEWS  COMPARISON:  None.  FINDINGS: There is mild S shaped scoliosis of the thoracic spine not associated with vertebral body anomalies. Mild degenerative changes are seen in the thoracic spine. No spondylolisthesis. Mild anterior wedge morphology noted at  T11 is of indeterminate age. No acute fracture.  IMPRESSION: Mild midthoracic spondylosis.  No spondylolisthesis.  Mild anterior wedging of T11, age indeterminate.  Electronically Signed   By: Nolon Nations M.D.   On: 02/18/2017 15:44  Complexity Note: Imaging results reviewed. Results shared with Ms. Robison, using Layman's terms.                         Meds   Current Outpatient Medications:  .  Biotin 10 MG CAPS, Take 1 tablet by mouth daily. , Disp: , Rfl:  .  busPIRone (BUSPAR) 5 MG tablet, Take 5 mg by mouth daily., Disp: , Rfl:  .  Calcium Carbonate-Vitamin D 600-125 MG-UNIT TABS, Take 1 tablet by mouth daily., Disp: , Rfl:  .  Cholecalciferol (VITAMIN D3) 1000 units CAPS, Take 1 capsule by mouth daily. , Disp: , Rfl:  .  citalopram (CELEXA) 20 MG tablet, Take 20 mg by mouth daily., Disp: , Rfl:  .  cyclobenzaprine (FLEXERIL) 10 MG tablet, Take 1 tablet (10 mg total) by mouth 3 (three) times daily., Disp: 270 tablet, Rfl: 0 .  diazepam (VALIUM) 5 MG tablet, Take 5 mg by mouth as needed. , Disp: , Rfl: 1 .  EPINEPHrine 0.3 mg/0.3 mL IJ SOAJ injection, Frequency:PRN   Dosage:0.0     Instructions:  Note:Dose: 0.3MG/0.3, Disp: , Rfl:  .  hydroxychloroquine (PLAQUENIL) 200 MG tablet, Take 200 mg by mouth 2 (two) times daily., Disp: , Rfl:  .  levonorgestrel (MIRENA) 20 MCG/24HR IUD, by Intrauterine route., Disp: , Rfl:  .  Multiple Vitamin (MULTI-VITAMINS) TABS, Take 1 tablet by mouth 3 (three) times daily. ,  Disp: , Rfl:  .  NATURE-THROID 48.75 MG TABS, Take 48.75 mg by mouth daily., Disp: , Rfl:  .  [START ON 05/19/2017] oxyCODONE (OXY IR/ROXICODONE) 5 MG immediate release tablet, Take 2 tablets (10 mg total) by mouth every 6 (six) hours as needed for severe pain., Disp: 240 tablet, Rfl: 0 .  potassium chloride SA (K-DUR,KLOR-CON) 20 MEQ tablet, Take 20 mEq daily by mouth. , Disp: , Rfl:  .  predniSONE (DELTASONE) 5 MG tablet, Take 2.5 mg by mouth daily with breakfast. , Disp: ,  Rfl:  .  ranitidine (ZANTAC) 150 MG tablet, Take 150 mg by mouth 2 (two) times daily. , Disp: , Rfl:  .  buPROPion (WELLBUTRIN) 100 MG tablet, Take 200 mg 2 (two) times daily by mouth. , Disp: , Rfl:  .  [START ON 04/19/2017] oxyCODONE (OXY IR/ROXICODONE) 5 MG immediate release tablet, Take 2 tablets (10 mg total) by mouth every 6 (six) hours as needed for severe pain., Disp: 240 tablet, Rfl: 0 .  [START ON 03/20/2017] oxyCODONE (OXY IR/ROXICODONE) 5 MG immediate release tablet, Take 2 tablets (10 mg total) by mouth every 6 (six) hours as needed for severe pain., Disp: 240 tablet, Rfl: 0  ROS  Constitutional: Denies any fever or chills Gastrointestinal: No reported hemesis, hematochezia, vomiting, or acute GI distress Musculoskeletal: Denies any acute onset joint swelling, redness, loss of ROM, or weakness Neurological: No reported episodes of acute onset apraxia, aphasia, dysarthria, agnosia, amnesia, paralysis, loss of coordination, or loss of consciousness  Allergies  Ms. Murrillo is allergic to bee venom; honey bee venom; penicillins; shellfish allergy; shellfish-derived products; sulfa antibiotics; erythromycin; sulfamethizole; zaleplon; codeine; erythromycin base; and trazodone.  PFSH  Drug: Ms. Tetro  reports that she does not use drugs. Alcohol:  reports that she does not drink alcohol. Tobacco:  reports that she has quit smoking. she has never used smokeless tobacco. Medical:  has a past medical history of Acid reflux (12/15/2011), Anxiety, generalized (12/15/2011), Arthritis, Cervical pain (12/15/2011), Depression, GERD (gastroesophageal reflux disease), Hiatal hernia, LBP (low back pain) (12/15/2011), Migraine, Muscle ache (12/15/2011), Pain (12/27/2012), Rheumatoid arthritis ([place]) (12/15/2011), and Scratched cornea (10/25/2015). Surgical: Ms. Herst  has a past surgical history that includes Cholecystectomy. Family: family history includes Depression in her mother; Diabetes in her  father; Hyperlipidemia in her mother; Hypertension in her father and mother.  Constitutional Exam  General appearance: Well nourished, well developed, and well hydrated. In no apparent acute distress Vitals:   03/01/17 1210  BP: 130/83  Pulse: 98  Resp: 16  Temp: 97.8 F (36.6 C)  TempSrc: Oral  SpO2: 100%  Weight: 175 lb (79.4 kg)  Height: 5' 6"  (1.676 m)   BMI Assessment: Estimated body mass index is 28.25 kg/m as calculated from the following:   Height as of this encounter: 5' 6"  (1.676 m).   Weight as of this encounter: 175 lb (79.4 kg). Psych/Mental status: Alert, oriented x 3 (person, place, & time)       Eyes: PERLA Respiratory: No evidence of acute respiratory distress  Cervical Spine Area Exam  Skin & Axial Inspection: No masses, redness, edema, swelling, or associated skin lesions Alignment: Symmetrical Functional ROM: Unrestricted ROM      Stability: No instability detected Muscle Tone/Strength: Functionally intact. No obvious neuro-muscular anomalies detected. Sensory (Neurological): Unimpaired Palpation: No palpable anomalies              Upper Extremity (UE) Exam    Side: Right upper extremity  Side: Left  upper extremity  Skin & Extremity Inspection: Skin color, temperature, and hair growth are WNL. No peripheral edema or cyanosis. No masses, redness, swelling, asymmetry, or associated skin lesions. No contractures.  Skin & Extremity Inspection: Skin color, temperature, and hair growth are WNL. No peripheral edema or cyanosis. No masses, redness, swelling, asymmetry, or associated skin lesions. No contractures.  Functional ROM: Unrestricted ROM          Functional ROM: Unrestricted ROM          Muscle Tone/Strength: Functionally intact. No obvious neuro-muscular anomalies detected.  Muscle Tone/Strength: Functionally intact. No obvious neuro-muscular anomalies detected.  Sensory (Neurological): Unimpaired          Sensory (Neurological): Unimpaired           Palpation: No palpable anomalies              Palpation: No palpable anomalies              Specialized Test(s): Deferred         Specialized Test(s): Deferred          Thoracic Spine Area Exam  Skin & Axial Inspection: No masses, redness, or swelling Alignment: Symmetrical Functional ROM: Unrestricted ROM Stability: No instability detected Muscle Tone/Strength: Functionally intact. No obvious neuro-muscular anomalies detected. Sensory (Neurological): Unimpaired Muscle strength & Tone: Complains of area being tender to palpation  Lumbar Spine Area Exam  Skin & Axial Inspection: No masses, redness, or swelling Alignment: Symmetrical Functional ROM: Unrestricted ROM      Stability: No instability detected Muscle Tone/Strength: Functionally intact. No obvious neuro-muscular anomalies detected. Sensory (Neurological): Unimpaired Palpation: Complains of area being tender to palpation       Provocative Tests: Lumbar Hyperextension and rotation test: evaluation deferred today       Lumbar Lateral bending test: evaluation deferred today       Patrick's Maneuver: evaluation deferred today                    Gait & Posture Assessment  Ambulation: Unassisted Gait: Relatively normal for age and body habitus Posture: WNL   Lower Extremity Exam    Side: Right lower extremity  Side: Left lower extremity  Skin & Extremity Inspection: Skin color, temperature, and hair growth are WNL. No peripheral edema or cyanosis. No masses, redness, swelling, asymmetry, or associated skin lesions. No contractures.  Skin & Extremity Inspection: Skin color, temperature, and hair growth are WNL. No peripheral edema or cyanosis. No masses, redness, swelling, asymmetry, or associated skin lesions. No contractures.  Functional ROM: Unrestricted ROM          Functional ROM: Unrestricted ROM          Muscle Tone/Strength: Functionally intact. No obvious neuro-muscular anomalies detected.  Muscle Tone/Strength:  Functionally intact. No obvious neuro-muscular anomalies detected.  Sensory (Neurological): Unimpaired  Sensory (Neurological): Unimpaired  Palpation: No palpable anomalies  Palpation: No palpable anomalies   Assessment  Primary Diagnosis & Pertinent Problem List: The primary encounter diagnosis was Thoracic spinal stenosis (Severe, Left, T10-11 Lateral Recess Stenosis). Diagnoses of Chronic pain syndrome, Chronic low back pain (Location of Primary Source of Pain) (Bilateral) (R>L), Chronic lumbar radicular pain (Bilateral) (R>L), Chronic cervical radicular pain (Bilateral) (R>L), and Muscle spasm were also pertinent to this visit.  Status Diagnosis  Controlled Controlled Controlled 1. Thoracic spinal stenosis (Severe, Left, T10-11 Lateral Recess Stenosis)   2. Chronic pain syndrome   3. Chronic low back pain (Location of Primary Source of Pain) (  Bilateral) (R>L)   4. Chronic lumbar radicular pain (Bilateral) (R>L)   5. Chronic cervical radicular pain (Bilateral) (R>L)   6. Muscle spasm     Problems updated and reviewed during this visit: No problems updated. Plan of Care  Pharmacotherapy (Medications Ordered): Meds ordered this encounter  Medications  . oxyCODONE (OXY IR/ROXICODONE) 5 MG immediate release tablet    Sig: Take 2 tablets (10 mg total) by mouth every 6 (six) hours as needed for severe pain.    Dispense:  240 tablet    Refill:  0    Do not place this medication, or any other prescription from our practice, on "Automatic Refill". Patient may have prescription filled one day early if pharmacy is closed on scheduled refill date. Do not fill until:05/19/2017 To last until:06/18/2017    Order Specific Question:   Supervising Provider    Answer:   Milinda Pointer [phone]  . oxyCODONE (OXY IR/ROXICODONE) 5 MG immediate release tablet    Sig: Take 2 tablets (10 mg total) by mouth every 6 (six) hours as needed for severe pain.    Dispense:  240 tablet    Refill:  0    Do  not place this medication, or any other prescription from our practice, on "Automatic Refill". Patient may have prescription filled one day early if pharmacy is closed on scheduled refill date. Do not fill until:04/19/2017 To last until:05/19/2017    Order Specific Question:   Supervising Provider    Answer:   Milinda Pointer [phone]  . oxyCODONE (OXY IR/ROXICODONE) 5 MG immediate release tablet    Sig: Take 2 tablets (10 mg total) by mouth every 6 (six) hours as needed for severe pain.    Dispense:  240 tablet    Refill:  0    Do not place this medication, or any other prescription from our practice, on "Automatic Refill". Patient may have prescription filled one day early if pharmacy is closed on scheduled refill date. Do not fill until: 03/20/2017 To last until: 04/19/2017    Order Specific Question:   Supervising Provider    Answer:   Milinda Pointer [phone]  . cyclobenzaprine (FLEXERIL) 10 MG tablet    Sig: Take 1 tablet (10 mg total) by mouth 3 (three) times daily.    Dispense:  270 tablet    Refill:  0    Do not place this medication, or any other prescription from our practice, on "Automatic Refill". Patient may have prescription filled one day early if pharmacy is closed on scheduled refill date. Fill date: 09/21/2016    Order Specific Question:   Supervising Provider    Answer:   Milinda Pointer [phone]   New Prescriptions   No medications on file   Medications administered today: Muranda Grisanti had no medications administered during this visit. Lab-work, procedure(s), and/or referral(s): No orders of the defined types were placed in this encounter.  Imaging and/or referral(s): None  Interventional therapies: Planned, scheduled, and/or pending: Not at this time.   Considering: Diagnostic bilateral lumbar facet block  Possible bilateral lumbar facet radiofrequency ablation  Diagnostic right-sided L5-S1 lumbar epidural steroid injection  Possible  right-sided L5-S1 versus S1 transforaminal epidural steroid injection  Diagnostic bilateral intra-articular knee injections with local anesthetic and steroid  Possible series of 5 intra-articular Hyalgan knee injections  Diagnostic bilateral Genicular nerve block  Possible bilateral Genicular nerve radiofrequency ablation   Palliative PRN treatment(s): Diagnostic bilateral lumbar facet block  Diagnostic right-sided L5-S1 lumbar epidural steroid injection  Diagnostic bilateral intra-articular knee injections with local anesthetic and steroid  Diagnostic bilateral Genicular nerve block   Provider-requested follow-up: Return in about 3 months (around 05/30/2017) for MedMgmt with Me Donella Stade Edison Pace).  Future Appointments  Date Time Provider [place]  05/27/2017 11:00 AM Holly Francois, NP [facility] None   Primary Care Physician: Holly Companion, MD Location: [facility] Outpatient [facility] Management Facility Note by: Holly Francois NP Date: 03/01/2017; Time: 3:03 PM  Pain Score Disclaimer: We use the NRS-11 scale. This is a self-reported, subjective measurement of pain severity with only modest accuracy. It is used primarily to identify changes within a particular patient. It must be understood that outpatient pain scales are significantly less accurate that those used for research, where they can be applied under ideal controlled circumstances with minimal exposure to variables. In reality, the score is likely to be a combination of pain intensity and pain affect, where pain affect describes the degree of emotional arousal or changes in action readiness caused by the sensory experience of pain. Factors such as social and work situation, setting, emotional state, anxiety levels, expectation, and prior pain experience may influence pain perception and show large inter-individual differences that may also be affected by time variables.  Patient instructions provided during this  appointment: Patient Instructions   ____________________________________________________________________________________________  Medication Rules  Applies to: All patients receiving prescriptions (written or electronic).  Pharmacy of record: Pharmacy where electronic prescriptions will be sent. If written prescriptions are taken to a different pharmacy, please inform the nursing staff. The pharmacy listed in the electronic medical record should be the one where you would like electronic prescriptions to be sent.  Prescription refills: Only during scheduled appointments. Applies to both, written and electronic prescriptions.  NOTE: The following applies primarily to controlled substances (Opioid* Pain Medications).   Patient's responsibilities: 1. Pain Pills: Bring all pain pills to every appointment (except for procedure appointments). 2. Pill Bottles: Bring pills in original pharmacy bottle. Always bring newest bottle. Bring bottle, even if empty. 3. Medication refills: You are responsible for knowing and keeping track of what medications you need refilled. The day before your appointment, write a list of all prescriptions that need to be refilled. Bring that list to your appointment and give it to the admitting nurse. Prescriptions will be written only during appointments. If you forget a medication, it will not be "Called in", "Faxed", or "electronically sent". You will need to get another appointment to get these prescribed. 4. Prescription Accuracy: You are responsible for carefully inspecting your prescriptions before leaving our office. Have the discharge nurse carefully go over each prescription with you, before taking them home. Make sure that your name is accurately spelled, that your address is correct. Check the name and dose of your medication to make sure it is accurate. Check the number of pills, and the written instructions to make sure they are clear and accurate. Make sure that  you are given enough medication to last until your next medication refill appointment. 5. Taking Medication: Take medication as prescribed. Never take more pills than instructed. Never take medication more frequently than prescribed. Taking less pills or less frequently is permitted and encouraged, when it comes to controlled substances (written prescriptions).  6. Inform other Doctors: Always inform, all of your healthcare providers, of all the medications you take. 7. Pain Medication from other Providers: You are not allowed to accept any additional pain medication from any other Doctor or Healthcare provider. There are two exceptions to  this rule. (see below) In the event that you require additional pain medication, you are responsible for notifying us, as stated below. 8. Medication Agreement: You are responsible for carefully reading and following our Medication Agreement. This must be signed before receiving any prescriptions from our practice. Safely store a copy of your signed Agreement. Violations to the Agreement will result in no further prescriptions. (Additional copies of our Medication Agreement are available upon request.) 9. Laws, Rules, & Regulations: All patients are expected to follow all Federal and [employer], [employer], Rules, [employer]. Ignorance of the Laws does not constitute a valid excuse. The use of any illegal substances is prohibited. 10. Adopted CDC guidelines & recommendations: Target dosing levels will be at or below 60 MME/day. Use of benzodiazepines** is not recommended.  Exceptions: There are only two exceptions to the rule of not receiving pain medications from other Healthcare Providers. 1. Exception #1 (Emergencies): In the event of an emergency (i.e.: accident requiring emergency care), you are allowed to receive additional pain medication. However, you are responsible for: As soon as you are able, call our office [phone], at any time of the day or night,  and leave a message stating your name, the date and nature of the emergency, and the name and dose of the medication prescribed. In the event that your call is answered by a member of our staff, make sure to document and save the date, time, and the name of the person that took your information.  2. Exception #2 (Planned Surgery): In the event that you are scheduled by another doctor or dentist to have any type of surgery or procedure, you are allowed (for a period no longer than 30 days), to receive additional pain medication, for the acute post-op pain. However, in this case, you are responsible for picking up a copy of our "Post-op Pain Management for Surgeons" handout, and giving it to your surgeon or dentist. This document is available at our office, and does not require an appointment to obtain it. Simply go to our office during business hours (Monday-Thursday from 8:00 AM to 4:00 PM) (Friday 8:00 AM to 12:00 Noon) or if you have a scheduled appointment with [place], prior to your surgery, and ask for it by name. In addition, you will need to provide [place] with your name, name of your surgeon, type of surgery, and date of procedure or surgery.  *Opioid medications include: morphine, codeine, oxycodone, oxymorphone, hydrocodone, hydromorphone, meperidine, tramadol, tapentadol, buprenorphine, fentanyl, methadone. **Benzodiazepine medications include: diazepam (Valium), alprazolam (Xanax), clonazepam (Klonopine), lorazepam (Ativan), clorazepate (Tranxene), chlordiazepoxide (Librium), estazolam (Prosom), oxazepam (Serax), temazepam (Restoril), triazolam (Halcion)  ____________________________________________________________________________________________

## 2017-03-01 NOTE — Patient Instructions (Signed)

## 2017-03-08 DIAGNOSIS — M79641 Pain in right hand: Secondary | ICD-10-CM | POA: Diagnosis not present

## 2017-03-08 DIAGNOSIS — M5414 Radiculopathy, thoracic region: Secondary | ICD-10-CM | POA: Diagnosis not present

## 2017-03-08 DIAGNOSIS — M79642 Pain in left hand: Secondary | ICD-10-CM | POA: Diagnosis not present

## 2017-03-08 DIAGNOSIS — M0609 Rheumatoid arthritis without rheumatoid factor, multiple sites: Secondary | ICD-10-CM | POA: Diagnosis not present

## 2017-03-26 DIAGNOSIS — M79642 Pain in left hand: Secondary | ICD-10-CM | POA: Diagnosis not present

## 2017-03-26 DIAGNOSIS — M79641 Pain in right hand: Secondary | ICD-10-CM | POA: Diagnosis not present

## 2017-05-27 ENCOUNTER — Other Ambulatory Visit: Payer: Self-pay

## 2017-05-27 ENCOUNTER — Ambulatory Visit: Payer: 59 | Attending: Nurse Practitioner | Admitting: Nurse Practitioner

## 2017-05-27 ENCOUNTER — Encounter: Payer: Self-pay | Admitting: Nurse Practitioner

## 2017-05-27 VITALS — BP 126/94 | HR 90 | Temp 98.2°F | Resp 16 | Ht 67.0 in | Wt 175.0 lb

## 2017-05-27 DIAGNOSIS — M549 Dorsalgia, unspecified: Secondary | ICD-10-CM | POA: Diagnosis not present

## 2017-05-27 DIAGNOSIS — M546 Pain in thoracic spine: Secondary | ICD-10-CM

## 2017-05-27 DIAGNOSIS — M79604 Pain in right leg: Secondary | ICD-10-CM | POA: Insufficient documentation

## 2017-05-27 DIAGNOSIS — G894 Chronic pain syndrome: Secondary | ICD-10-CM | POA: Diagnosis not present

## 2017-05-27 DIAGNOSIS — M9982 Other biomechanical lesions of thoracic region: Secondary | ICD-10-CM

## 2017-05-27 DIAGNOSIS — M47816 Spondylosis without myelopathy or radiculopathy, lumbar region: Secondary | ICD-10-CM | POA: Diagnosis not present

## 2017-05-27 DIAGNOSIS — G5603 Carpal tunnel syndrome, bilateral upper limbs: Secondary | ICD-10-CM | POA: Diagnosis not present

## 2017-05-27 DIAGNOSIS — M50223 Other cervical disc displacement at C6-C7 level: Secondary | ICD-10-CM | POA: Diagnosis not present

## 2017-05-27 DIAGNOSIS — M5116 Intervertebral disc disorders with radiculopathy, lumbar region: Secondary | ICD-10-CM | POA: Insufficient documentation

## 2017-05-27 DIAGNOSIS — M62838 Other muscle spasm: Secondary | ICD-10-CM | POA: Diagnosis not present

## 2017-05-27 DIAGNOSIS — M47812 Spondylosis without myelopathy or radiculopathy, cervical region: Secondary | ICD-10-CM | POA: Diagnosis not present

## 2017-05-27 DIAGNOSIS — M4804 Spinal stenosis, thoracic region: Secondary | ICD-10-CM | POA: Diagnosis not present

## 2017-05-27 DIAGNOSIS — Z79891 Long term (current) use of opiate analgesic: Secondary | ICD-10-CM

## 2017-05-27 DIAGNOSIS — M6281 Muscle weakness (generalized): Secondary | ICD-10-CM | POA: Insufficient documentation

## 2017-05-27 DIAGNOSIS — M4726 Other spondylosis with radiculopathy, lumbar region: Secondary | ICD-10-CM | POA: Insufficient documentation

## 2017-05-27 DIAGNOSIS — F411 Generalized anxiety disorder: Secondary | ICD-10-CM | POA: Insufficient documentation

## 2017-05-27 DIAGNOSIS — M79609 Pain in unspecified limb: Secondary | ICD-10-CM | POA: Insufficient documentation

## 2017-05-27 DIAGNOSIS — Z79899 Other long term (current) drug therapy: Secondary | ICD-10-CM | POA: Diagnosis not present

## 2017-05-27 DIAGNOSIS — G8929 Other chronic pain: Secondary | ICD-10-CM | POA: Diagnosis not present

## 2017-05-27 DIAGNOSIS — M542 Cervicalgia: Secondary | ICD-10-CM | POA: Insufficient documentation

## 2017-05-27 DIAGNOSIS — Z87891 Personal history of nicotine dependence: Secondary | ICD-10-CM | POA: Diagnosis not present

## 2017-05-27 DIAGNOSIS — Z9884 Bariatric surgery status: Secondary | ICD-10-CM | POA: Insufficient documentation

## 2017-05-27 DIAGNOSIS — Z5181 Encounter for therapeutic drug level monitoring: Secondary | ICD-10-CM | POA: Diagnosis not present

## 2017-05-27 MED ORDER — OXYCODONE HCL 5 MG PO TABS: 10.0000 mg | ORAL_TABLET | Freq: Four times a day (QID) | ORAL | 0 refills | Status: DC | PRN

## 2017-05-27 MED ORDER — CYCLOBENZAPRINE HCL 10 MG PO TABS: 10.0000 mg | ORAL_TABLET | Freq: Three times a day (TID) | ORAL | 0 refills | Status: DC

## 2017-05-27 NOTE — Patient Instructions (Addendum)
____________________________________________________________________________________________  Medication Rules  Applies to: All patients receiving prescriptions (written or electronic).  Pharmacy of record: Pharmacy where electronic prescriptions will be sent. If written prescriptions are taken to a different pharmacy, please inform the nursing staff. The pharmacy listed in the electronic medical record should be the one where you would like electronic prescriptions to be sent.  Prescription refills: Only during scheduled appointments. Applies to both, written and electronic prescriptions.  NOTE: The following applies primarily to controlled substances (Opioid* Pain Medications).   Patient's responsibilities: 1. Pain Pills: Bring all pain pills to every appointment (except for procedure appointments). 2. Pill Bottles: Bring pills in original pharmacy bottle. Always bring newest bottle. Bring bottle, even if empty. 3. Medication refills: You are responsible for knowing and keeping track of what medications you need refilled. The day before your appointment, write a list of all prescriptions that need to be refilled. Bring that list to your appointment and give it to the admitting nurse. Prescriptions will be written only during appointments. If you forget a medication, it will not be "Called in", "Faxed", or "electronically sent". You will need to get another appointment to get these prescribed. 4. Prescription Accuracy: You are responsible for carefully inspecting your prescriptions before leaving our office. Have the discharge nurse carefully go over each prescription with you, before taking them home. Make sure that your name is accurately spelled, that your address is correct. Check the name and dose of your medication to make sure it is accurate. Check the number of pills, and the written instructions to make sure they are clear and accurate. Make sure that you are given enough medication to last  until your next medication refill appointment. 5. Taking Medication: Take medication as prescribed. Never take more pills than instructed. Never take medication more frequently than prescribed. Taking less pills or less frequently is permitted and encouraged, when it comes to controlled substances (written prescriptions).  6. Inform other Doctors: Always inform, all of your healthcare providers, of all the medications you take. 7. Pain Medication from other Providers: You are not allowed to accept any additional pain medication from any other Doctor or Healthcare provider. There are two exceptions to this rule. (see below) In the event that you require additional pain medication, you are responsible for notifying us, as stated below. 8. Medication Agreement: You are responsible for carefully reading and following our Medication Agreement. This must be signed before receiving any prescriptions from our practice. Safely store a copy of your signed Agreement. Violations to the Agreement will result in no further prescriptions. (Additional copies of our Medication Agreement are available upon request.) 9. Laws, Rules, & Regulations: All patients are expected to follow all Federal and State Laws, Statutes, Rules, & Regulations. Ignorance of the Laws does not constitute a valid excuse. The use of any illegal substances is prohibited. 10. Adopted CDC guidelines & recommendations: Target dosing levels will be at or below 60 MME/day. Use of benzodiazepines** is not recommended.  Exceptions: There are only two exceptions to the rule of not receiving pain medications from other Healthcare Providers. 1. Exception #1 (Emergencies): In the event of an emergency (i.e.: accident requiring emergency care), you are allowed to receive additional pain medication. However, you are responsible for: As soon as you are able, call our office [phone], at any time of the day or night, and leave a message stating your name, the  date and nature of the emergency, and the name and dose of the medication   prescribed. In the event that your call is answered by a member of our staff, make sure to document and save the date, time, and the name of the person that took your information.  2. Exception #2 (Planned Surgery): In the event that you are scheduled by another doctor or dentist to have any type of surgery or procedure, you are allowed (for a period no longer than 30 days), to receive additional pain medication, for the acute post-op pain. However, in this case, you are responsible for picking up a copy of our "Post-op Pain Management for Surgeons" handout, and giving it to your surgeon or dentist. This document is available at our office, and does not require an appointment to obtain it. Simply go to our office during business hours (Monday-Thursday from 8:00 AM to 4:00 PM) (Friday 8:00 AM to 12:00 Noon) or if you have a scheduled appointment with [place], prior to your surgery, and ask for it by name. In addition, you will need to provide [place] with your name, name of your surgeon, type of surgery, and date of procedure or surgery.  *Opioid medications include: morphine, codeine, oxycodone, oxymorphone, hydrocodone, hydromorphone, meperidine, tramadol, tapentadol, buprenorphine, fentanyl, methadone. **Benzodiazepine medications include: diazepam (Valium), alprazolam (Xanax), clonazepam (Klonopine), lorazepam (Ativan), clorazepate (Tranxene), chlordiazepoxide (Librium), estazolam (Prosom), oxazepam (Serax), temazepam (Restoril), triazolam (Halcion) (Last updated: 04/01/2017) ____________________________________________________________________________________________ Holly Spears were given 3 prescriptions for Oxycodone today. A prescription for Flexeril was sent to your pharmacy.  BMI Assessment: Estimated body mass index is 27.41 kg/m as calculated from the following:   Height as of this encounter: 5\' 7"  (1.702 m).   Weight as of this  encounter: 175 lb (79.4 kg).  BMI interpretation table: BMI level Category Range association with higher incidence of chronic pain  <18 kg/m2 Underweight   18.5-24.9 kg/m2 Ideal body weight   25-29.9 kg/m2 Overweight Increased incidence by 20%  30-34.9 kg/m2 Obese (Class I) Increased incidence by 68%  35-39.9 kg/m2 Severe obesity (Class II) Increased incidence by 136%  >40 kg/m2 Extreme obesity (Class III) Increased incidence by 254%   Patient's current BMI Ideal Body weight  Body mass index is 27.41 kg/m. Ideal body weight: 61.6 kg (135 lb 12.9 oz) Adjusted ideal body weight: 68.7 kg (151 lb 7.7 oz)   BMI Readings from Last 4 Encounters:  05/27/17 27.41 kg/m  03/01/17 28.25 kg/m  12/07/16 26.63 kg/m  09/07/16 27.44 kg/m   Wt Readings from Last 4 Encounters:  05/27/17 175 lb (79.4 kg)  03/01/17 175 lb (79.4 kg)  12/07/16 170 lb (77.1 kg)  09/07/16 170 lb (77.1 kg)

## 2017-05-27 NOTE — Progress Notes (Signed)
Patient's Name: Holly Spears  MRN: [id number]  Referring Provider: Judene Companion, MD  DOB: 1970/10/18  PCP: Judene Companion, MD  DOS: 05/27/2017  Note by: Vevelyn Francois NP  Service setting: Ambulatory outpatient  Specialty: Interventional Pain Management  Location: [facility] (AMB) Pain Management Facility    Patient type: Established    Primary Reason(s) for Visit: Encounter for prescription drug management. (Level of risk: moderate)  CC: Back Pain (middle)  HPI  Ms. Wollen is a 47 y.o. year old, female patient, who comes today for a medication management evaluation. She has Long term current use of opiate analgesic; Long term prescription opiate use; Opiate use (60 MME/Day); Encounter for therapeutic drug level monitoring; Opiate dependence ([place]); Encounter for chronic pain management; Muscle spasm; Myofascial pain; Acquired hypothyroidism; Intracervical pessary; Major depressive disorder, recurrent episode, moderate ([place]); Migraine with aura; Rheumatoid arthritis ([place]); Musculoskeletal pain; Chronic pain syndrome; Chronic neck pain; Cervical spondylosis (C5-6 and C6-7); Cervical disc herniation (C6-7) (Left); Bulge of cervical disc without myelopathy (C5-6); Cervicogenic headache; Chronic cervical radicular pain (Bilateral) (R>L); Chronic pain of both shoulders (Bilateral) (R>L); Chronic low back pain (Location of Primary Source of Pain) (Bilateral) (R>L); Failed back surgical syndrome; 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; Lumbar spondylosis (L3-4, L4-5, and L5-S1); Lumbar bulging disc (L3-4, L4-5, and L5-S1); Chronic lumbar radicular pain (Bilateral) (R>L); Chronic knee pain (Location of Secondary source of pain) (Bilateral) (R>L); Chronic upper back pain (Left); Thoracic disc herniation (Large, Left paracentral T10-11 disc herniation); Thoracic spinal stenosis (Severe, Left, T10-11 Lateral Recess Stenosis); 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 Her primarily concern today is the Back Pain (middle)  Pain Assessment: Location: Mid Back Radiating: right leg to the heel Onset: More than a month ago Duration: Chronic pain Quality: Aching Severity: 1 /10 (self-reported pain score)  Note: Reported level is compatible with observation.                          Timing: Constant Modifying factors: heat  Ms. Bloxham was last scheduled for an appointment on 03/01/2017 for medication management. During today's appointment we reviewed Ms. Pfister's chronic pain status, as well as her outpatient medication regimen. She admits that her pain is stable. She does have numbness and tingling in the right leg. She denies any weakness. She denies any side effects of her medication. She denies any concerns to day. She denies treatment with interventional therapy.   The patient  reports that she does not use drugs. Her body mass index is 27.41 kg/m.  Further details on both, my assessment(s), as well as the proposed treatment plan, please see below.  Controlled Substance Pharmacotherapy Assessment REMS (Risk Evaluation and Mitigation Strategy)  Analgesic:Oxycodone IR 5 mg 2 tablets every 6 hours (40 mg/day of oxycodone per day) MME/day:60 mg/day     [name], RN  05/27/2017 10:54 AM  Sign at close encounter Nursing Pain Medication Assessment:  Safety precautions to be maintained throughout the outpatient stay will include: orient to surroundings, keep bed in low  position, maintain call bell within  reach at all times, provide assistance with transfer out of bed and ambulation.  Medication Inspection Compliance: Pill count conducted under aseptic conditions, in front of the patient. Neither the pills nor the bottle was removed from the patient's sight at any time. Once count was completed pills were immediately returned to the patient in their original bottle.  Medication: Oxycodone IR Pill/Patch Count: 180 of 240 pills remain Pill/Patch Appearance: Markings consistent with prescribed medication Bottle Appearance: Standard pharmacy container. Clearly labeled. Filled Date: 04/17/ 2019 Last Medication intake:  Today   Pharmacokinetics: Liberation and absorption (onset of action): WNL Distribution (time to peak effect): WNL Metabolism and excretion (duration of action): WNL         Pharmacodynamics: Desired effects: Analgesia: Ms. Paquette reports >50% benefit. Functional ability: Patient reports that medication allows her to accomplish basic ADLs Clinically meaningful improvement in function (CMIF): Sustained CMIF goals met Perceived effectiveness: Described as relatively effective, allowing for increase in activities of daily living (ADL) Undesirable effects: Side-effects or Adverse reactions: None reported Monitoring: [place] PMP: Online review of the past 90-monthperiod conducted. Compliant with practice rules and regulations Last UDS on record: Summary  Date Value Ref Range Status  12/07/2016 FINAL  Final    Comment:    ==================================================================== TOXASSURE SELECT 13 (MW) ==================================================================== Test                             Result       Flag       Units Drug Present and Declared for Prescription Verification   Oxycodone                      597          EXPECTED   ng/mg creat   Oxymorphone                    192          EXPECTED   ng/mg creat    Noroxycodone                   3403         EXPECTED   ng/mg creat    Sources of oxycodone include scheduled prescription medications.    Oxymorphone and noroxycodone are expected metabolites of    oxycodone. Oxymorphone is also available as a scheduled    prescription medication. Drug Absent but Declared for Prescription Verification   Diazepam                       Not Detected UNEXPECTED ng/mg creat ==================================================================== Test                      Result    Flag   Units      Ref Range   Creatinine              37               mg/dL      >=20 ==================================================================== Declared Medications:  The flagging and interpretation on this report are based on the  following declared medications.  Unexpected results may arise from  inaccuracies in the declared medications.  **Note: The testing scope of this panel includes these medications:  Diazepam (Valium)  Oxycodone  **Note: The testing scope of this panel does not include following  reported medications:  Bupropion  Buspirone  Calcium Carbonate (Calcium Carb/Vitamin D)  Citalopram (Celexa)  Cyclobenzaprine (Flexeril)  Epinephrine (EpiPen)  Hydroxychloroquine (Plaquenil)  Levonorgestrel (Mirena)  Multivitamin  Potassium (K-Dur)  Prednisone (Deltasone)  Ranitidine (Zantac)  Supplement  Vitamin B (Biotin)  Vitamin D (Calcium Carb/Vitamin D)  Vitamin D3 ==================================================================== For clinical consultation, please call [phone]. ====================================================================    UDS interpretation: Compliant          Medication Assessment Form: Reviewed. Patient indicates being compliant with therapy Treatment compliance: Compliant Risk Assessment Profile: Aberrant behavior: See prior evaluations. None observed or detected today Comorbid factors increasing risk of overdose:  See prior notes. No additional risks detected today Risk of substance use disorder (SUD): Low  ORT Scoring interpretation table:  Score <3 = Low Risk for SUD  Score between 4-7 = Moderate Risk for SUD  Score >8 = High Risk for Opioid Abuse   Risk Mitigation Strategies:  Patient Counseling: Covered Patient-Prescriber Agreement (PPA): Present and active  Notification to other healthcare providers: Done  Pharmacologic Plan: No change in therapy, at this time.             Laboratory Chemistry  Inflammation Markers (CRP: Acute Phase) (ESR: Chronic Phase) Lab Results  Component Value Date   CRP <0.5 12/31/2014   ESRSEDRATE 5 12/31/2014                         Rheumatology Markers No results found for: Elayne Guerin, [facility]                      Renal Function Markers Lab Results  Component Value Date   BUN 7 03/27/2015   CREATININE 0.58 03/27/2015   GFRAA >60 03/27/2015   GFRNONAA >60 03/27/2015                              Hepatic Function Markers Lab Results  Component Value Date   AST 26 03/27/2015   ALT 26 03/27/2015   ALBUMIN 4.3 03/27/2015   ALKPHOS 78 03/27/2015                        Electrolytes Lab Results  Component Value Date   NA 139 03/27/2015   K 3.1 (L) 03/27/2015   CL 104 03/27/2015   CALCIUM 8.7 (L) 03/27/2015   MG 2.0 12/31/2014                        Neuropathy Markers No results found for: VITAMINB12, FOLATE, HGBA1C, HIV                      Bone Pathology Markers Lab Results  Component Value Date   VD25OH 29.3 (L) 12/31/2014                         Coagulation Parameters No results found for: INR, LABPROT, APTT, PLT, DDIMER                      Cardiovascular Markers No results found for: BNP, CKTOTAL, CKMB, TROPONINI, HGB, HCT                       CA Markers No results found for: CEA, CA125, LABCA2  Note: Lab results reviewed.  Recent Diagnostic Imaging Results  DG Lumbar  Spine Complete W/Bend CLINICAL DATA:  Chronic pain, no injury Please comment in the report about spinal instability (>4 mm displacement), in addition to any: Spondylolisthesis (specify retro- or antero- and Grade level) (describe displacement in millimeters )  EXAM: LUMBAR SPINE - COMPLETE WITH BENDING VIEWS  COMPARISON:  None.  FINDINGS: There is mild degenerative change in the facet joints of the lower lumbar spine. No acute fracture or subluxation. Surgical clips are noted in the right upper quadrant the abdomen. Intrauterine device overlies the central pelvis.  IMPRESSION: Mild lower lumbar degenerative changes.  No spondylolisthesis.  Electronically Signed   By: Nolon Nations M.D.   On: 02/18/2017 15:47 DG Thoracic Spine 2 View CLINICAL DATA:  Chronic pain, no injury Please comment in the report about spinal instability (>4 mm displacement), in addition to any: Spondylolisthesis (specify retro- or antero- and Grade level) (describe displacement in millimeters);  EXAM: THORACIC SPINE 2 VIEWS  COMPARISON:  None.  FINDINGS: There is mild S shaped scoliosis of the thoracic spine not associated with vertebral body anomalies. Mild degenerative changes are seen in the thoracic spine. No spondylolisthesis. Mild anterior wedge morphology noted at T11 is of indeterminate age. No acute fracture.  IMPRESSION: Mild midthoracic spondylosis.  No spondylolisthesis.  Mild anterior wedging of T11, age indeterminate.  Electronically Signed   By: Nolon Nations M.D.   On: 02/18/2017 15:44  Complexity Note: Imaging results reviewed. Results shared with Ms. Caris, using Layman's terms.                         Meds   Current Outpatient Medications:  .  Biotin 10 MG CAPS, Take 1 tablet by mouth daily. , Disp: , Rfl:  .  busPIRone (BUSPAR) 5 MG tablet, Take 5 mg by mouth daily., Disp: , Rfl:  .  Calcium Carbonate-Vitamin D 600-125 MG-UNIT TABS, Take 1 tablet by mouth  daily., Disp: , Rfl:  .  Cholecalciferol (VITAMIN D3) 1000 units CAPS, Take 1 capsule by mouth daily. , Disp: , Rfl:  .  citalopram (CELEXA) 20 MG tablet, Take 20 mg by mouth daily., Disp: , Rfl:  .  [START ON 06/16/2017] cyclobenzaprine (FLEXERIL) 10 MG tablet, Take 1 tablet (10 mg total) by mouth 3 (three) times daily., Disp: 270 tablet, Rfl: 0 .  diazepam (VALIUM) 5 MG tablet, Take 5 mg by mouth as needed. , Disp: , Rfl: 1 .  EPINEPHrine 0.3 mg/0.3 mL IJ SOAJ injection, Frequency:PRN   Dosage:0.0     Instructions:  Note:Dose: 0.3MG/0.3, Disp: , Rfl:  .  hydroxychloroquine (PLAQUENIL) 200 MG tablet, Take 200 mg by mouth 2 (two) times daily., Disp: , Rfl:  .  levonorgestrel (MIRENA) 20 MCG/24HR IUD, by Intrauterine route., Disp: , Rfl:  .  Multiple Vitamin (MULTI-VITAMINS) TABS, Take 1 tablet by mouth 3 (three) times daily. , Disp: , Rfl:  .  NATURE-THROID 48.75 MG TABS, Take 48.75 mg by mouth daily., Disp: , Rfl:  .  [START ON 07/18/2017] oxyCODONE (OXY IR/ROXICODONE) 5 MG immediate release tablet, Take 2 tablets (10 mg total) by mouth every 6 (six) hours as needed for severe pain., Disp: 240 tablet, Rfl: 0 .  potassium chloride SA (K-DUR,KLOR-CON) 20 MEQ tablet, Take 20 mEq daily by mouth. , Disp: , Rfl:  .  predniSONE (DELTASONE) 5 MG tablet, Take 2.5 mg by mouth daily with breakfast. , Disp: , Rfl:  .  ranitidine (ZANTAC) 150 MG tablet, Take 150 mg by mouth 2 (two) times daily. , Disp: , Rfl:  .  buPROPion (WELLBUTRIN) 100 MG tablet, Take 200 mg 2 (two) times daily by mouth. , Disp: , Rfl:  .  [START ON 08/17/2017] oxyCODONE (OXY IR/ROXICODONE) 5 MG immediate release tablet, Take 2 tablets (10 mg total) by mouth every 6 (six) hours as needed for severe pain., Disp: 240 tablet, Rfl: 0 .  [START ON 06/18/2017] oxyCODONE (OXY IR/ROXICODONE) 5 MG immediate release tablet, Take 2 tablets (10 mg total) by mouth every 6 (six) hours as needed for severe pain., Disp: 240 tablet, Rfl: 0  ROS   Constitutional: Denies any fever or chills Gastrointestinal: No reported hemesis, hematochezia, vomiting, or acute GI distress Musculoskeletal: Denies any acute onset joint swelling, redness, loss of ROM, or weakness Neurological: No reported episodes of acute onset apraxia, aphasia, dysarthria, agnosia, amnesia, paralysis, loss of coordination, or loss of consciousness  Allergies  Ms. Deschepper is allergic to bee venom; honey bee venom; penicillins; shellfish allergy; shellfish-derived products; sulfa antibiotics; erythromycin; sulfamethizole; zaleplon; codeine; erythromycin base; and trazodone.  PFSH  Drug: Ms. Akard  reports that she does not use drugs. Alcohol:  reports that she does not drink alcohol. Tobacco:  reports that she has quit smoking. She has never used smokeless tobacco. Medical:  has a past medical history of Acid reflux (12/15/2011), Anxiety, generalized (12/15/2011), Arthritis, Cervical pain (12/15/2011), Depression, GERD (gastroesophageal reflux disease), Hiatal hernia, LBP (low back pain) (12/15/2011), Migraine, Muscle ache (12/15/2011), Pain (12/27/2012), Rheumatoid arthritis ([place]) (12/15/2011), and Scratched cornea (10/25/2015). Surgical: Ms. Neace  has a past surgical history that includes Cholecystectomy. Family: family history includes Depression in her mother; Diabetes in her father; Hyperlipidemia in her mother; Hypertension in her father and mother.  Constitutional Exam  General appearance: Well nourished, well developed, and well hydrated. In no apparent acute distress Vitals:   05/27/17 1045  BP: (!) 126/94  Pulse: 90  Resp: 16  Temp: 98.2 F (36.8 C)  TempSrc: Oral  SpO2: 100%  Weight: 175 lb (79.4 kg)  Height: 5' 7" (1.702 m)  Psych/Mental status: Alert, oriented x 3 (person, place, & time)       Eyes: PERLA Respiratory: No evidence of acute respiratory distress  Thoracic Spine Area Exam  Skin & Axial Inspection: No masses, redness, or  swelling Alignment: Symmetrical Functional ROM: Adequate ROM Stability: No instability detected Muscle Tone/Strength: Functionally intact. No obvious neuro-muscular anomalies detected. Sensory (Neurological): Unimpaired Muscle strength & Tone: Complains of area being tender to palpation  Lumbar Spine Area Exam  Skin & Axial Inspection: No masses, redness, or swelling Alignment: Symmetrical Functional ROM: Adequate ROM       Stability: No instability detected Muscle Tone/Strength: Functionally intact. No obvious neuro-muscular anomalies detected. Sensory (Neurological): Unimpaired Palpation: Non-tender       Provocative Tests: Lumbar Hyperextension and rotation test: evaluation deferred today       Lumbar Lateral bending test: evaluation deferred today       Patrick's Maneuver: evaluation deferred today                    Gait & Posture Assessment  Ambulation: Unassisted Gait: Relatively normal for age and body habitus Posture: WNL   Lower Extremity Exam    Side: Right lower extremity  Side: Left lower extremity  Skin & Extremity Inspection: Skin color, temperature, and hair growth are WNL. No peripheral edema or cyanosis. No masses, redness, swelling, asymmetry, or  associated skin lesions. No contractures.  Skin & Extremity Inspection: Skin color, temperature, and hair growth are WNL. No peripheral edema or cyanosis. No masses, redness, swelling, asymmetry, or associated skin lesions. No contractures.  Functional ROM: Unrestricted ROM          Functional ROM: Unrestricted ROM          Muscle Tone/Strength: Able to Toe-walk & Heel-walk without problems  Muscle Tone/Strength: Able to Toe-walk & Heel-walk without problems  Sensory (Neurological): Unimpaired  Sensory (Neurological): Unimpaired  Palpation: No palpable anomalies  Palpation: No palpable anomalies   Assessment  Primary Diagnosis & Pertinent Problem List: The primary encounter diagnosis was Thoracic foraminal stenosis  (Severe Left T10-11). Diagnoses of Chronic upper back pain (Left), Lumbar spondylosis (L3-4, L4-5, and L5-S1), Chronic pain syndrome, Long term prescription opiate use, and Muscle spasm were also pertinent to this visit.  Status Diagnosis  Controlled Controlled Controlled 1. 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 Chronic upper back pain (Left)   3. Lumbar spondylosis (L3-4, L4-5, and L5-S1)   4. Chronic pain syndrome   5. Long term prescription opiate use   6. Muscle spasm     Problems updated and reviewed during this visit: Problem  Muscle Weakness  Pain in Limb  Neck Pain  Candidiasis of Mouth  Tick Bite  Spasm of Back Muscles  Chronic Back Pain  Major Depressive Disorder, Recurrent Episode, Moderate ([facility])   Last Assessment & Plan:  Depression is unchanged.  Continue current treatment regimen. Depression will be reassessed in 1 month.   Migraine With Aura   Overview:  [place] neurology  Overview:  [facility] neurology   Rheumatoid Arthritis ([facility])   Rheumatologist: Dr Tera Mater.  Overview:  continue rheumatology followup with Dr Tera Mater.   Esophageal Reflux   Last Assessment & Plan:  Worsening recently Change to nexium and add ranitidine at bedtime if needed.  Follow up in 1 month   Generalized Anxiety Disorder   Overview:  [place], [place]   Hypothyroidism   Last Assessment & Plan:  Recheck levels, and titrate if appropriate.    Adult Hypothyroidism (Resolved)   Last Assessment & Plan:  Recheck levels, and titrate if appropriate.     Plan of Care  Pharmacotherapy (Medications Ordered): Meds ordered this encounter  Medications  . oxyCODONE (OXY IR/ROXICODONE) 5 MG immediate release tablet    Sig: Take 2 tablets (10 mg total) by mouth every 6 (six) hours as needed for severe pain.    Dispense:  240 tablet    Refill:  0    Do not place this medication, or any other prescription from our practice, on "Automatic Refill". Patient may  have prescription filled one day early if pharmacy is closed on scheduled refill date. Do not fill until:08/17/2017 To last until:09/16/2017    Order Specific Question:   Supervising Provider    Answer:   Milinda Pointer [phone]  . oxyCODONE (OXY IR/ROXICODONE) 5 MG immediate release tablet    Sig: Take 2 tablets (10 mg total) by mouth every 6 (six) hours as needed for severe pain.    Dispense:  240 tablet    Refill:  0    Do not place this medication, or any other prescription from our practice, on "Automatic Refill". Patient may have prescription filled one day early if pharmacy is closed on scheduled refill date. Do not fill until:07/18/2017 To last until: 08/17/2017    Order Specific Question:   Supervising Provider  AnswerMilinda Pointer [[id number]]  . oxyCODONE (OXY IR/ROXICODONE) 5 MG immediate release tablet    Sig: Take 2 tablets (10 mg total) by mouth every 6 (six) hours as needed for severe pain.    Dispense:  240 tablet    Refill:  0    Do not place this medication, or any other prescription from our practice, on "Automatic Refill". Patient may have prescription filled one day early if pharmacy is closed on scheduled refill date. Do not fill until: 06/18/2017 To last until:07/18/2017    Order Specific Question:   Supervising Provider    Answer:   Milinda Pointer [phone]  . cyclobenzaprine (FLEXERIL) 10 MG tablet    Sig: Take 1 tablet (10 mg total) by mouth 3 (three) times daily.    Dispense:  270 tablet    Refill:  0    Do not place this medication, or any other prescription from our practice, on "Automatic Refill". Patient may have prescription filled one day early if pharmacy is closed on scheduled refill date. Fill date: 09/21/2016    Order Specific Question:   Supervising Provider    Answer:   Milinda Pointer [phone]   New Prescriptions   No medications on file   Medications administered today: Kelli Curfman had no medications administered during this  visit. Lab-work, procedure(s), and/or referral(s): Orders Placed This Encounter  Procedures  . ToxASSURE Select 13 (MW), Urine   Imaging and/or referral(s): None  Interventional therapies: Planned, scheduled, and/or pending: Not at this time.   Considering: Diagnostic bilateral lumbar facet block  Possible bilateral lumbar facet radiofrequency ablation  Diagnostic right-sided L5-S1 lumbar epidural steroid injection  Possible right-sided L5-S1 versus S1 transforaminal epidural steroid injection  Diagnostic bilateral intra-articular knee injections with local anesthetic and steroid  Possible series of 5 intra-articular Hyalgan knee injections  Diagnostic bilateral Genicular nerve block  Possible bilateral Genicular nerve radiofrequency ablation   Palliative PRN treatment(s): Diagnostic bilateral lumbar facet block  Diagnostic right-sided L5-S1 lumbar epidural steroid injection  Diagnostic bilateral intra-articular knee injections with local anesthetic and steroid  Diagnostic bilateral Genicular nerve block   Provider-requested follow-up: Return in about 3 months (around 08/26/2017) for MedMgmt with Me Donella Stade Edison Pace).  No future appointments. Primary Care Physician: Judene Companion, MD Location: [facility] Note by: Vevelyn Francois NP Date: 05/27/2017; Time: 11:16 AM  Pain Score Disclaimer: We use the NRS-11 scale. This is a self-reported, subjective measurement of pain severity with only modest accuracy. It is used primarily to identify changes within a particular patient. It must be understood that outpatient pain scales are significantly less accurate that those used for research, where they can be applied under ideal controlled circumstances with minimal exposure to variables. In reality, the score is likely to be a combination of pain intensity and pain affect, where pain affect describes the degree of emotional arousal or changes in  action readiness caused by the sensory experience of pain. Factors such as social and work situation, setting, emotional state, anxiety levels, expectation, and prior pain experience may influence pain perception and show large inter-individual differences that may also be affected by time variables.  Patient instructions provided during this appointment: Patient Instructions   ____________________________________________________________________________________________  Medication Rules  Applies to: All patients receiving prescriptions (written or electronic).  Pharmacy of record: Pharmacy where electronic prescriptions will be sent. If written prescriptions are taken to a different pharmacy, please inform the nursing staff. The pharmacy listed in the electronic medical  record should be the one where you would like electronic prescriptions to be sent.  Prescription refills: Only during scheduled appointments. Applies to both, written and electronic prescriptions.  NOTE: The following applies primarily to controlled substances (Opioid* Pain Medications).   Patient's responsibilities: 1. Pain Pills: Bring all pain pills to every appointment (except for procedure appointments). 2. Pill Bottles: Bring pills in original pharmacy bottle. Always bring newest bottle. Bring bottle, even if empty. 3. Medication refills: You are responsible for knowing and keeping track of what medications you need refilled. The day before your appointment, write a list of all prescriptions that need to be refilled. Bring that list to your appointment and give it to the admitting nurse. Prescriptions will be written only during appointments. If you forget a medication, it will not be "Called in", "Faxed", or "electronically sent". You will need to get another appointment to get these prescribed. 4. Prescription Accuracy: You are responsible for carefully inspecting your prescriptions before leaving our office. Have the  discharge nurse carefully go over each prescription with you, before taking them home. Make sure that your name is accurately spelled, that your address is correct. Check the name and dose of your medication to make sure it is accurate. Check the number of pills, and the written instructions to make sure they are clear and accurate. Make sure that you are given enough medication to last until your next medication refill appointment. 5. Taking Medication: Take medication as prescribed. Never take more pills than instructed. Never take medication more frequently than prescribed. Taking less pills or less frequently is permitted and encouraged, when it comes to controlled substances (written prescriptions).  6. Inform other Doctors: Always inform, all of your healthcare providers, of all the medications you take. 7. Pain Medication from other Providers: You are not allowed to accept any additional pain medication from any other Doctor or Healthcare provider. There are two exceptions to this rule. (see below) In the event that you require additional pain medication, you are responsible for notifying us, as stated below. 8. Medication Agreement: You are responsible for carefully reading and following our Medication Agreement. This must be signed before receiving any prescriptions from our practice. Safely store a copy of your signed Agreement. Violations to the Agreement will result in no further prescriptions. (Additional copies of our Medication Agreement are available upon request.) 9. Laws, Rules, & Regulations: All patients are expected to follow all Federal and [employer], [employer], Rules, [employer]. Ignorance of the Laws does not constitute a valid excuse. The use of any illegal substances is prohibited. 10. Adopted CDC guidelines & recommendations: Target dosing levels will be at or below 60 MME/day. Use of benzodiazepines** is not recommended.  Exceptions: There are only two exceptions to the rule of  not receiving pain medications from other Healthcare Providers. 1. Exception #1 (Emergencies): In the event of an emergency (i.e.: accident requiring emergency care), you are allowed to receive additional pain medication. However, you are responsible for: As soon as you are able, call our office [phone], at any time of the day or night, and leave a message stating your name, the date and nature of the emergency, and the name and dose of the medication prescribed. In the event that your call is answered by a member of our staff, make sure to document and save the date, time, and the name of the person that took your information.  2. Exception #2 (Planned Surgery): In the event that you are  scheduled by another doctor or dentist to have any type of surgery or procedure, you are allowed (for a period no longer than 30 days), to receive additional pain medication, for the acute post-op pain. However, in this case, you are responsible for picking up a copy of our "Post-op Pain Management for Surgeons" handout, and giving it to your surgeon or dentist. This document is available at our office, and does not require an appointment to obtain it. Simply go to our office during business hours (Monday-Thursday from 8:00 AM to 4:00 PM) (Friday 8:00 AM to 12:00 Noon) or if you have a scheduled appointment with [place], prior to your surgery, and ask for it by name. In addition, you will need to provide [place] with your name, name of your surgeon, type of surgery, and date of procedure or surgery.  *Opioid medications include: morphine, codeine, oxycodone, oxymorphone, hydrocodone, hydromorphone, meperidine, tramadol, tapentadol, buprenorphine, fentanyl, methadone. **Benzodiazepine medications include: diazepam (Valium), alprazolam (Xanax), clonazepam (Klonopine), lorazepam (Ativan), clorazepate (Tranxene), chlordiazepoxide (Librium), estazolam (Prosom), oxazepam (Serax), temazepam (Restoril), triazolam (Halcion) (Last  updated: 04/01/2017) ____________________________________________________________________________________________ Dennis Bast were given 3 prescriptions for Oxycodone today. A prescription for Flexeril was sent to your pharmacy.  BMI Assessment: Estimated body mass index is 27.41 kg/m as calculated from the following:   Height as of this encounter: 5' 7" (1.702 m).   Weight as of this encounter: 175 lb (79.4 kg).  BMI interpretation table: BMI level Category Range association with higher incidence of chronic pain  <18 kg/m2 Underweight   18.5-24.9 kg/m2 Ideal body weight   25-29.9 kg/m2 Overweight Increased incidence by 20%  30-34.9 kg/m2 Obese (Class I) Increased incidence by 68%  35-39.9 kg/m2 Severe obesity (Class II) Increased incidence by 136%  >40 kg/m2 Extreme obesity (Class III) Increased incidence by 254%   Patient's current BMI Ideal Body weight  Body mass index is 27.41 kg/m. Ideal body weight: 61.6 kg (135 lb 12.9 oz) Adjusted ideal body weight: 68.7 kg (151 lb 7.7 oz)   BMI Readings from Last 4 Encounters:  05/27/17 27.41 kg/m  03/01/17 28.25 kg/m  12/07/16 26.63 kg/m  09/07/16 27.44 kg/m   Wt Readings from Last 4 Encounters:  05/27/17 175 lb (79.4 kg)  03/01/17 175 lb (79.4 kg)  12/07/16 170 lb (77.1 kg)  09/07/16 170 lb (77.1 kg)

## 2017-05-27 NOTE — Progress Notes (Signed)
Nursing Pain Medication Assessment:  Safety precautions to be maintained throughout the outpatient stay will include: orient to surroundings, keep bed in low position, maintain call bell within reach at all times, provide assistance with transfer out of bed and ambulation.  Medication Inspection Compliance: Pill count conducted under aseptic conditions, in front of the patient. Neither the pills nor the bottle was removed from the patient's sight at any time. Once count was completed pills were immediately returned to the patient in their original bottle.  Medication: Oxycodone IR Pill/Patch Count: 180 of 240 pills remain Pill/Patch Appearance: Markings consistent with prescribed medication Bottle Appearance: Standard pharmacy container. Clearly labeled. Filled Date: 04/17/ 2019 Last Medication intake:  Today

## 2017-06-02 LAB — TOXASSURE SELECT 13 (MW), URINE

## 2017-08-26 ENCOUNTER — Other Ambulatory Visit: Payer: Self-pay

## 2017-08-26 ENCOUNTER — Encounter: Payer: Self-pay | Admitting: Nurse Practitioner

## 2017-08-26 ENCOUNTER — Ambulatory Visit: Payer: 59 | Attending: Nurse Practitioner | Admitting: Nurse Practitioner

## 2017-08-26 VITALS — BP 150/93 | HR 102 | Temp 98.6°F | Resp 18 | Ht 67.0 in | Wt 165.0 lb

## 2017-08-26 DIAGNOSIS — Z882 Allergy status to sulfonamides status: Secondary | ICD-10-CM | POA: Diagnosis not present

## 2017-08-26 DIAGNOSIS — M47812 Spondylosis without myelopathy or radiculopathy, cervical region: Secondary | ICD-10-CM

## 2017-08-26 DIAGNOSIS — Z9884 Bariatric surgery status: Secondary | ICD-10-CM | POA: Diagnosis not present

## 2017-08-26 DIAGNOSIS — M4726 Other spondylosis with radiculopathy, lumbar region: Secondary | ICD-10-CM | POA: Insufficient documentation

## 2017-08-26 DIAGNOSIS — M79641 Pain in right hand: Secondary | ICD-10-CM | POA: Diagnosis not present

## 2017-08-26 DIAGNOSIS — M545 Low back pain: Secondary | ICD-10-CM | POA: Insufficient documentation

## 2017-08-26 DIAGNOSIS — M7918 Myalgia, other site: Secondary | ICD-10-CM

## 2017-08-26 DIAGNOSIS — E039 Hypothyroidism, unspecified: Secondary | ICD-10-CM | POA: Insufficient documentation

## 2017-08-26 DIAGNOSIS — Z789 Other specified health status: Secondary | ICD-10-CM

## 2017-08-26 DIAGNOSIS — Z79891 Long term (current) use of opiate analgesic: Secondary | ICD-10-CM | POA: Diagnosis not present

## 2017-08-26 DIAGNOSIS — M47816 Spondylosis without myelopathy or radiculopathy, lumbar region: Secondary | ICD-10-CM | POA: Diagnosis not present

## 2017-08-26 DIAGNOSIS — M50223 Other cervical disc displacement at C6-C7 level: Secondary | ICD-10-CM | POA: Insufficient documentation

## 2017-08-26 DIAGNOSIS — Z87891 Personal history of nicotine dependence: Secondary | ICD-10-CM | POA: Insufficient documentation

## 2017-08-26 DIAGNOSIS — F331 Major depressive disorder, recurrent, moderate: Secondary | ICD-10-CM | POA: Insufficient documentation

## 2017-08-26 DIAGNOSIS — Z5181 Encounter for therapeutic drug level monitoring: Secondary | ICD-10-CM | POA: Diagnosis not present

## 2017-08-26 DIAGNOSIS — G894 Chronic pain syndrome: Secondary | ICD-10-CM

## 2017-08-26 DIAGNOSIS — F411 Generalized anxiety disorder: Secondary | ICD-10-CM | POA: Diagnosis not present

## 2017-08-26 DIAGNOSIS — Z880 Allergy status to penicillin: Secondary | ICD-10-CM | POA: Diagnosis not present

## 2017-08-26 DIAGNOSIS — Z885 Allergy status to narcotic agent status: Secondary | ICD-10-CM | POA: Insufficient documentation

## 2017-08-26 DIAGNOSIS — M069 Rheumatoid arthritis, unspecified: Secondary | ICD-10-CM | POA: Insufficient documentation

## 2017-08-26 DIAGNOSIS — Z881 Allergy status to other antibiotic agents status: Secondary | ICD-10-CM | POA: Diagnosis not present

## 2017-08-26 DIAGNOSIS — E876 Hypokalemia: Secondary | ICD-10-CM | POA: Insufficient documentation

## 2017-08-26 DIAGNOSIS — M5116 Intervertebral disc disorders with radiculopathy, lumbar region: Secondary | ICD-10-CM | POA: Diagnosis not present

## 2017-08-26 DIAGNOSIS — K219 Gastro-esophageal reflux disease without esophagitis: Secondary | ICD-10-CM | POA: Insufficient documentation

## 2017-08-26 DIAGNOSIS — G5603 Carpal tunnel syndrome, bilateral upper limbs: Secondary | ICD-10-CM | POA: Diagnosis not present

## 2017-08-26 DIAGNOSIS — M62838 Other muscle spasm: Secondary | ICD-10-CM | POA: Diagnosis not present

## 2017-08-26 DIAGNOSIS — Z79899 Other long term (current) drug therapy: Secondary | ICD-10-CM | POA: Diagnosis not present

## 2017-08-26 MED ORDER — OXYCODONE HCL 5 MG PO TABS: 10.0000 mg | ORAL_TABLET | Freq: Four times a day (QID) | ORAL | 0 refills | Status: DC | PRN

## 2017-08-26 MED ORDER — CYCLOBENZAPRINE HCL 10 MG PO TABS: 10.0000 mg | ORAL_TABLET | Freq: Three times a day (TID) | ORAL | 0 refills | Status: DC

## 2017-08-26 NOTE — Progress Notes (Signed)
Patient's Name: Holly Spears  MRN: [id number]  Referring Provider: Judene Companion, MD  DOB: 23-Apr-1970  PCP: Judene Companion, MD  DOS: 08/26/2017  Note by: Vevelyn Francois NP  Service setting: Ambulatory outpatient  Specialty: Interventional Pain Management  Location: [facility] (AMB) Pain Management Facility    Patient type: Established    Primary Reason(s) for Visit: Encounter for prescription drug management. (Level of risk: moderate)  CC: Back Pain (lower)  HPI  Holly Spears is a 47 y.o. year old, female patient, who comes today for a medication management evaluation. She has Long term current use of opiate analgesic; Long term prescription opiate use; Opiate use (60 MME/Day); Encounter for therapeutic drug level monitoring; Opiate dependence ([place]); Encounter for chronic pain management; Muscle spasm; Myofascial pain; Acquired hypothyroidism; Intracervical pessary; Major depressive disorder, recurrent episode, moderate ([place]); Migraine with aura; Rheumatoid arthritis ([place]); Musculoskeletal pain; Chronic pain syndrome; Chronic neck pain; Cervical spondylosis (C5-6 and C6-7); Cervical disc herniation (C6-7) (Left); Bulge of cervical disc without myelopathy (C5-6); Cervicogenic headache; Chronic cervical radicular pain (Bilateral) (R>L); Chronic pain of both shoulders (Bilateral) (R>L); Chronic low back pain (Location of Primary Source of Pain) (Bilateral) (R>L); Failed back surgical syndrome; 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; Lumbar spondylosis (L3-4, L4-5, and L5-S1); Lumbar bulging disc (L3-4, L4-5, and L5-S1); Chronic lumbar radicular pain (Bilateral) (R>L); Chronic knee pain (Location of Secondary source of pain) (Bilateral) (R>L); Chronic upper back pain (Left); Thoracic disc herniation (Large, Left paracentral T10-11 disc herniation); Thoracic spinal stenosis (Severe, Left, T10-11 Lateral Recess Stenosis); Thoracic foraminal stenosis (Severe Left T10-11); History of bilateral carpal tunnel release (Bilateral);  Chronic hand pain (Bilateral) (R>L); Esophageal reflux; History of psychiatric disorder; History of psychiatric care; Generalized anxiety disorder; Depression; History of shingles; History of gastric bypass; History of Roux-en-Y gastric bypass; Hypokalemia; H/O eye disorder; Long term prescription benzodiazepine use; Candidiasis of mouth; Muscle weakness; Tick bite; Hypothyroidism; Pain in limb; Neck pain; Spasm of back muscles; Chronic back pain; Problems influencing health status; and Pharmacologic therapy on their problem list. Her primarily concern today is the Back Pain (lower)  Pain Assessment: Location: Lower Back Radiating: right leg to the heel Onset: More than a month ago Duration: Chronic pain Quality: Aching Severity: 2 /10 (subjective, self-reported pain score)  Note: Reported level is compatible with observation.                          Effect on ADL:   Timing: Constant Modifying factors: heat, foam pillow, standing BP: (!) 150/93  HR: (!) 102  [name] was last scheduled for an appointment on 05/27/2017 for medication management. During today's appointment we reviewed Holly Spears's chronic pain status, as well as her outpatient medication regimen.  She admits that her pain is stable.  She denies any concerns today.  She denies any side effects of her current regimen.  The patient  reports that she does not use drugs. Her body mass index is 25.84 kg/m.  Further details on both, my assessment(s), as well as the proposed treatment plan, please see below.  Controlled Substance Pharmacotherapy Assessment REMS (Risk Evaluation and Mitigation Strategy)  Analgesic:Oxycodone IR 5 mg 2 tablets every 6 hours (40 mg/day of oxycodone per day) MME/day:60 mg/day    [name], RN  08/26/2017 10:47 AM  Sign at close encounter Nursing Pain Medication Assessment:  Safety precautions to be maintained throughout the outpatient stay will include: orient to surroundings, keep bed in  low position, maintain  call bell within reach at all times, provide assistance with transfer out of bed and ambulation.  Medication Inspection Compliance: Pill count conducted under aseptic conditions, in front of the patient. Neither the pills nor the bottle was removed from the patient's sight at any time. Once count was completed pills were immediately returned to the patient in their original bottle.  Medication: Oxycodone IR Pill/Patch Count: 172  of 240 pills remain Pill/Patch Appearance: Markings consistent with prescribed medication Bottle Appearance: Standard pharmacy container. Clearly labeled. Filled Date: 07/16* / 2019 Last Medication intake:  Today   Pharmacokinetics: Liberation and absorption (onset of action): WNL Distribution (time to peak effect): WNL Metabolism and excretion (duration of action): WNL         Pharmacodynamics: Desired effects: Analgesia: Holly Spears reports >50% benefit. Functional ability: Patient reports that medication allows her to accomplish basic ADLs Clinically meaningful improvement in function (CMIF): Sustained CMIF goals met Perceived effectiveness: Described as relatively effective, allowing for increase in activities of daily living (ADL) Undesirable effects: Side-effects or Adverse reactions: None reported Monitoring: [place] PMP: Online review of the past 75-monthperiod conducted. Compliant with practice rules and regulations Last UDS on record: Summary  Date Value Ref Range Status  05/27/2017 FINAL  Final    Comment:    ==================================================================== TOXASSURE SELECT 13 (MW) ==================================================================== Test                             Result       Flag       Units Drug Present and Declared for Prescription Verification   Oxycodone                      865          EXPECTED   ng/mg creat   Noroxycodone                   3115         EXPECTED   ng/mg creat     Sources of oxycodone include scheduled prescription medications.    Noroxycodone is an expected metabolite of oxycodone. Drug Absent but Declared for Prescription Verification   Diazepam                       Not Detected UNEXPECTED ng/mg creat ==================================================================== Test                      Result    Flag   Units      Ref Range   Creatinine              20               mg/dL      >=20 ==================================================================== Declared Medications:  The flagging and interpretation on this report are based on the  following declared medications.  Unexpected results may arise from  inaccuracies in the declared medications.  **Note: The testing scope of this panel includes these medications:  Diazepam (Valium)  Oxycodone  **Note: The testing scope of this panel does not include following  reported medications:  Bupropion (Wellbutrin)  Buspirone  Calcium carbonate (Calcium Carb/Vitamin D)  Citalopram (Celexa)  Cyclobenzaprine (Flexeril)  Epinephrine (EpiPen)  Hydroxychloroquine (Plaquenil)  Levonorgestrel (Mirena)  Multivitamin  Potassium (Klor-Con)  Prednisone (Deltasone)  Ranitidine (Zantac)  Thyroid: Liothyronine/Levothyroxine  Vitamin B (Biotin)  Vitamin D (Calcium Carb/Vitamin D)  Vitamin D3 ==================================================================== For clinical consultation, please call [phone]. ====================================================================    UDS interpretation: Compliant          Medication Assessment Form: Reviewed. Patient indicates being compliant with therapy Treatment compliance: Compliant Risk Assessment Profile: Aberrant behavior: See prior evaluations. None observed or detected today Comorbid factors increasing risk of overdose: See prior notes. No additional risks detected today Risk of substance use disorder (SUD): Low Opioid Risk Tool -  08/26/17 1042      Family History of Substance Abuse   Alcohol  Negative    Illegal Drugs  Negative    Rx Drugs  Negative      Personal History of Substance Abuse   Alcohol  Negative    Illegal Drugs  Negative    Rx Drugs  Negative      Age   Age between 50-45 years   No      History of Preadolescent Sexual Abuse   History of Preadolescent Sexual Abuse  Negative or Female      Psychological Disease   Psychological Disease  Negative    Depression  Positive      Total Score   Opioid Risk Tool Scoring  1    Opioid Risk Interpretation  Low Risk      ORT Scoring interpretation table:  Score <3 = Low Risk for SUD  Score between 4-7 = Moderate Risk for SUD  Score >8 = High Risk for Opioid Abuse   Risk Mitigation Strategies:  Patient Counseling: Covered Patient-Prescriber Agreement (PPA): Present and active  Notification to other healthcare providers: Done  Pharmacologic Plan: No change in therapy, at this time.             Laboratory Chemistry  Inflammation Markers (CRP: Acute Phase) (ESR: Chronic Phase) Lab Results  Component Value Date   CRP <0.5 12/31/2014   ESRSEDRATE 5 12/31/2014                         Rheumatology Markers No results found for: RF, ANA, LABURIC, URICUR, LYMEIGGIGMAB, LYMEABIGMQN, HLAB27                      Renal Function Markers Lab Results  Component Value Date   BUN 7 03/27/2015   CREATININE 0.58 03/27/2015   GFRAA >60 03/27/2015   GFRNONAA >60 03/27/2015                             Hepatic Function Markers Lab Results  Component Value Date   AST 26 03/27/2015   ALT 26 03/27/2015   ALBUMIN 4.3 03/27/2015   ALKPHOS 78 03/27/2015                        Electrolytes Lab Results  Component Value Date   NA 139 03/27/2015   K 3.1 (L) 03/27/2015   CL 104 03/27/2015   CALCIUM 8.7 (L) 03/27/2015   MG 2.0 12/31/2014                        Neuropathy Markers No results found for: VITAMINB12, FOLATE, HGBA1C, HIV                       Bone Pathology Markers Lab Results  Component Value Date   VD25OH 29.3 (L) 12/31/2014  Coagulation Parameters No results found for: INR, LABPROT, APTT, PLT, DDIMER                      Cardiovascular Markers No results found for: BNP, CKTOTAL, CKMB, TROPONINI, HGB, HCT                       CA Markers No results found for: CEA, CA125, LABCA2                      Note: Lab results reviewed.  Recent Diagnostic Imaging Results  DG Lumbar Spine Complete W/Bend CLINICAL DATA:  Chronic pain, no injury Please comment in the report about spinal instability (>4 mm displacement), in addition to any: Spondylolisthesis (specify retro- or antero- and Grade level) (describe displacement in millimeters )  EXAM: LUMBAR SPINE - COMPLETE WITH BENDING VIEWS  COMPARISON:  None.  FINDINGS: There is mild degenerative change in the facet joints of the lower lumbar spine. No acute fracture or subluxation. Surgical clips are noted in the right upper quadrant the abdomen. Intrauterine device overlies the central pelvis.  IMPRESSION: Mild lower lumbar degenerative changes.  No spondylolisthesis.  Electronically Signed   By: Nolon Nations M.D.   On: 02/18/2017 15:47 DG Thoracic Spine 2 View CLINICAL DATA:  Chronic pain, no injury Please comment in the report about spinal instability (>4 mm displacement), in addition to any: Spondylolisthesis (specify retro- or antero- and Grade level) (describe displacement in millimeters);  EXAM: THORACIC SPINE 2 VIEWS  COMPARISON:  None.  FINDINGS: There is mild S shaped scoliosis of the thoracic spine not associated with vertebral body anomalies. Mild degenerative changes are seen in the thoracic spine. No spondylolisthesis. Mild anterior wedge morphology noted at T11 is of indeterminate age. No acute fracture.  IMPRESSION: Mild midthoracic spondylosis.  No spondylolisthesis.  Mild anterior wedging of T11, age  indeterminate.  Electronically Signed   By: Nolon Nations M.D.   On: 02/18/2017 15:44  Complexity Note: Imaging results reviewed. Results shared with Ms. Victorino, using Layman's terms.                         Meds   Current Outpatient Medications:  .  Biotin 10 MG CAPS, Take 1 tablet by mouth daily. , Disp: , Rfl:  .  busPIRone (BUSPAR) 5 MG tablet, Take 5 mg by mouth daily., Disp: , Rfl:  .  Calcium Carbonate-Vitamin D 600-125 MG-UNIT TABS, Take 1 tablet by mouth daily., Disp: , Rfl:  .  Cholecalciferol (VITAMIN D3) 1000 units CAPS, Take 1 capsule by mouth daily. , Disp: , Rfl:  .  citalopram (CELEXA) 20 MG tablet, Take 20 mg by mouth daily., Disp: , Rfl:  .  [START ON 09/16/2017] cyclobenzaprine (FLEXERIL) 10 MG tablet, Take 1 tablet (10 mg total) by mouth 3 (three) times daily., Disp: 270 tablet, Rfl: 0 .  diazepam (VALIUM) 5 MG tablet, Take 5 mg by mouth as needed. , Disp: , Rfl: 1 .  EPINEPHrine 0.3 mg/0.3 mL IJ SOAJ injection, Frequency:PRN   Dosage:0.0     Instructions:  Note:Dose: 0.3MG/0.3, Disp: , Rfl:  .  hydroxychloroquine (PLAQUENIL) 200 MG tablet, Take 200 mg by mouth 2 (two) times daily., Disp: , Rfl:  .  levonorgestrel (MIRENA) 20 MCG/24HR IUD, by Intrauterine route., Disp: , Rfl:  .  Multiple Vitamin (MULTI-VITAMINS) TABS, Take 1 tablet by mouth 3 (three) times daily. ,  Disp: , Rfl:  .  NATURE-THROID 48.75 MG TABS, Take 48.75 mg by mouth daily., Disp: , Rfl:  .  [START ON 11/15/2017] oxyCODONE (OXY IR/ROXICODONE) 5 MG immediate release tablet, Take 2 tablets (10 mg total) by mouth every 6 (six) hours as needed for severe pain., Disp: 240 tablet, Rfl: 0 .  potassium chloride SA (K-DUR,KLOR-CON) 20 MEQ tablet, Take 20 mEq daily by mouth. , Disp: , Rfl:  .  predniSONE (DELTASONE) 5 MG tablet, Take 2.5 mg by mouth daily with breakfast. , Disp: , Rfl:  .  ranitidine (ZANTAC) 150 MG tablet, Take 150 mg by mouth 2 (two) times daily. , Disp: , Rfl:  .  buPROPion (WELLBUTRIN) 100  MG tablet, Take 200 mg 2 (two) times daily by mouth. , Disp: , Rfl:  .  [START ON 10/16/2017] oxyCODONE (OXY IR/ROXICODONE) 5 MG immediate release tablet, Take 2 tablets (10 mg total) by mouth every 6 (six) hours as needed for severe pain., Disp: 240 tablet, Rfl: 0 .  [START ON 09/16/2017] oxyCODONE (OXY IR/ROXICODONE) 5 MG immediate release tablet, Take 2 tablets (10 mg total) by mouth every 6 (six) hours as needed for severe pain., Disp: 240 tablet, Rfl: 0  ROS  Constitutional: Denies any fever or chills Gastrointestinal: No reported hemesis, hematochezia, vomiting, or acute GI distress Musculoskeletal: Denies any acute onset joint swelling, redness, loss of ROM, or weakness Neurological: No reported episodes of acute onset apraxia, aphasia, dysarthria, agnosia, amnesia, paralysis, loss of coordination, or loss of consciousness  Allergies  Ms. Guarnieri is allergic to bee venom; honey bee venom; penicillins; shellfish allergy; shellfish-derived products; sulfa antibiotics; erythromycin; sulfamethizole; zaleplon; codeine; erythromycin base; and trazodone.  PFSH  Drug: Ms. Magnussen  reports that she does not use drugs. Alcohol:  reports that she does not drink alcohol. Tobacco:  reports that she has quit smoking. She has never used smokeless tobacco. Medical:  has a past medical history of Acid reflux (12/15/2011), Anxiety, generalized (12/15/2011), Arthritis, Cervical pain (12/15/2011), Depression, GERD (gastroesophageal reflux disease), Hiatal hernia, LBP (low back pain) (12/15/2011), Migraine, Muscle ache (12/15/2011), Pain (12/27/2012), Rheumatoid arthritis ([place]) (12/15/2011), and Scratched cornea (10/25/2015). Surgical: Ms. Larcom  has a past surgical history that includes Cholecystectomy. Family: family history includes Depression in her mother; Diabetes in her father; Hyperlipidemia in her mother; Hypertension in her father and mother.  Constitutional Exam  General appearance: Well nourished,  well developed, and well hydrated. In no apparent acute distress Vitals:   08/26/17 1038  BP: (!) 150/93  Pulse: (!) 102  Resp: 18  Temp: 98.6 F (37 C)  TempSrc: Oral  SpO2: 99%  Weight: 165 lb (74.8 kg)  Height: 5' 7"  (1.702 m)   BMI Assessment: Estimated body mass index is 25.84 kg/m as calculated from the following:   Height as of this encounter: 5' 7"  (1.702 m).   Weight as of this encounter: 165 lb (74.8 kg). Psych/Mental status: Alert, oriented x 3 (person, place, & time)       Eyes: PERLA Respiratory: No evidence of acute respiratory distress  Lumbar Spine Area Exam  Skin & Axial Inspection: No masses, redness, or swelling Alignment: Symmetrical Functional ROM: Unrestricted ROM       Stability: No instability detected Muscle Tone/Strength: Functionally intact. No obvious neuro-muscular anomalies detected. Sensory (Neurological): Unimpaired Palpation: No palpable anomalies       Provocative Tests: Lumbar Hyperextension/rotation test: no change from prior assessment       Lumbar quadrant test (Kemp's test): deferred  today       Lumbar Lateral bending test: no change from prior assessment       Patrick's Maneuver: deferred today                   FABER test: deferred today                   Thigh-thrust test: deferred today       S-I compression test: deferred today       S-I distraction test: deferred today        Gait & Posture Assessment  Ambulation: Unassisted Gait: Relatively normal for age and body habitus Posture: WNL   Lower Extremity Exam    Side: Right lower extremity  Side: Left lower extremity  Stability: No instability observed          Stability: No instability observed          Skin & Extremity Inspection: Skin color, temperature, and hair growth are WNL. No peripheral edema or cyanosis. No masses, redness, swelling, asymmetry, or associated skin lesions. No contractures.  Skin & Extremity Inspection: Skin color, temperature, and hair growth are WNL.  No peripheral edema or cyanosis. No masses, redness, swelling, asymmetry, or associated skin lesions. No contractures.  Functional ROM: Unrestricted ROM                  Functional ROM: Unrestricted ROM                  Muscle Tone/Strength: Functionally intact. No obvious neuro-muscular anomalies detected.  Muscle Tone/Strength: Functionally intact. No obvious neuro-muscular anomalies detected.  Sensory (Neurological): Unimpaired  Sensory (Neurological): Unimpaired  Palpation: No palpable anomalies  Palpation: No palpable anomalies   Assessment  Primary Diagnosis & Pertinent Problem List: The primary encounter diagnosis was Lumbar spondylosis (L3-4, L4-5, and L5-S1). Diagnoses of Osteoarthritis of cervical spine, unspecified spinal osteoarthritis complication status, Myofascial pain, Muscle spasm, Chronic pain syndrome, Problems influencing health status, and Pharmacologic therapy were also pertinent to this visit.  Status Diagnosis  Controlled Controlled Controlled 1. Lumbar spondylosis (L3-4, L4-5, and L5-S1)   2. Osteoarthritis of cervical spine, unspecified spinal osteoarthritis complication status   3. Myofascial pain   4. Muscle spasm   5. Chronic pain syndrome   6. Problems influencing health status   7. Pharmacologic therapy     Problems updated and reviewed during this visit: Problem  Problems Influencing Health Status  Pharmacologic Therapy   Plan of Care  Pharmacotherapy (Medications Ordered): Meds ordered this encounter  Medications  . cyclobenzaprine (FLEXERIL) 10 MG tablet    Sig: Take 1 tablet (10 mg total) by mouth 3 (three) times daily.    Dispense:  270 tablet    Refill:  0    Do not place this medication, or any other prescription from our practice, on "Automatic Refill". Patient may have prescription filled one day early if pharmacy is closed on scheduled refill date. Fill date: 09/21/2016    Order Specific Question:   Supervising Provider    Answer:    Milinda Pointer [phone]  . oxyCODONE (OXY IR/ROXICODONE) 5 MG immediate release tablet    Sig: Take 2 tablets (10 mg total) by mouth every 6 (six) hours as needed for severe pain.    Dispense:  240 tablet    Refill:  0    Do not place this medication, or any other prescription from our practice, on "Automatic Refill". Patient may have prescription filled one  day early if pharmacy is closed on scheduled refill date. Do not fill until:11/15/2017 To last until:12/15/2017    Order Specific Question:   Supervising Provider    Answer:   Milinda Pointer [phone]  . oxyCODONE (OXY IR/ROXICODONE) 5 MG immediate release tablet    Sig: Take 2 tablets (10 mg total) by mouth every 6 (six) hours as needed for severe pain.    Dispense:  240 tablet    Refill:  0    Do not place this medication, or any other prescription from our practice, on "Automatic Refill". Patient may have prescription filled one day early if pharmacy is closed on scheduled refill date. Do not fill until:10/16/2017 To last until: 11/15/2017    Order Specific Question:   Supervising Provider    Answer:   Milinda Pointer [phone]  . oxyCODONE (OXY IR/ROXICODONE) 5 MG immediate release tablet    Sig: Take 2 tablets (10 mg total) by mouth every 6 (six) hours as needed for severe pain.    Dispense:  240 tablet    Refill:  0    Do not place this medication, or any other prescription from our practice, on "Automatic Refill". Patient may have prescription filled one day early if pharmacy is closed on scheduled refill date. Do not fill until: 09/16/2017 To last until:10/16/2017    Order Specific Question:   Supervising Provider    Answer:   Milinda Pointer [phone]   New Prescriptions   No medications on file   Medications administered today: Coleen Whobrey had no medications administered during this visit. Lab-work, procedure(s), and/or referral(s): No orders of the defined types were placed in this encounter.  Imaging  and/or referral(s): None   Interventional therapies: Planned, scheduled, and/or pending: Not at this time.   Considering: Diagnostic bilateral lumbar facet block  Possible bilateral lumbar facet radiofrequency ablation  Diagnostic right-sided L5-S1 lumbar epidural steroid injection  Possible right-sided L5-S1 versus S1 transforaminal epidural steroid injection  Diagnostic bilateral intra-articular knee injections with local anesthetic and steroid  Possible series of 5 intra-articular Hyalgan knee injections  Diagnostic bilateral Genicular nerve block  Possible bilateral Genicular nerve radiofrequency ablation   Palliative PRN treatment(s): Diagnostic bilateral lumbar facet block  Diagnostic right-sided L5-S1 lumbar epidural steroid injection  Diagnostic bilateral intra-articular knee injections with local anesthetic and steroid  Diagnostic bilateral Genicular nerve block      Provider-requested follow-up: Return in about 3 months (around 11/26/2017) for MedMgmt with Me Donella Stade Edison Pace).  Future Appointments  Date Time Provider [place]  12/01/2017 11:00 AM Vevelyn Francois, NP [facility] None   Primary Care Physician: Judene Companion, MD Location: [facility] Note by: Vevelyn Francois NP Date: 08/26/2017; Time: 3:46 PM  Pain Score Disclaimer: We use the NRS-11 scale. This is a self-reported, subjective measurement of pain severity with only modest accuracy. It is used primarily to identify changes within a particular patient. It must be understood that outpatient pain scales are significantly less accurate that those used for research, where they can be applied under ideal controlled circumstances with minimal exposure to variables. In reality, the score is likely to be a combination of pain intensity and pain affect, where pain affect describes the degree of emotional arousal or changes in action readiness caused by the sensory  experience of pain. Factors such as social and work situation, setting, emotional state, anxiety levels, expectation, and prior pain experience may influence pain perception and show large inter-individual differences that may also be  affected by time variables.  Patient instructions provided during this appointment: Patient Instructions  ____________________________________________________________________________________________  Medication Rules  Applies to: All patients receiving prescriptions (written or electronic).  Pharmacy of record: Pharmacy where electronic prescriptions will be sent. If written prescriptions are taken to a different pharmacy, please inform the nursing staff. The pharmacy listed in the electronic medical record should be the one where you would like electronic prescriptions to be sent.  Prescription refills: Only during scheduled appointments. Applies to both, written and electronic prescriptions.  NOTE: The following applies primarily to controlled substances (Opioid* Pain Medications).   Patient's responsibilities: 1. Pain Pills: Bring all pain pills to every appointment (except for procedure appointments). 2. Pill Bottles: Bring pills in original pharmacy bottle. Always bring newest bottle. Bring bottle, even if empty. 3. Medication refills: You are responsible for knowing and keeping track of what medications you need refilled. The day before your appointment, write a list of all prescriptions that need to be refilled. Bring that list to your appointment and give it to the admitting nurse. Prescriptions will be written only during appointments. If you forget a medication, it will not be "Called in", "Faxed", or "electronically sent". You will need to get another appointment to get these prescribed. 4. Prescription Accuracy: You are responsible for carefully inspecting your prescriptions before leaving our office. Have the discharge nurse carefully go over each  prescription with you, before taking them home. Make sure that your name is accurately spelled, that your address is correct. Check the name and dose of your medication to make sure it is accurate. Check the number of pills, and the written instructions to make sure they are clear and accurate. Make sure that you are given enough medication to last until your next medication refill appointment. 5. Taking Medication: Take medication as prescribed. Never take more pills than instructed. Never take medication more frequently than prescribed. Taking less pills or less frequently is permitted and encouraged, when it comes to controlled substances (written prescriptions).  6. Inform other Doctors: Always inform, all of your healthcare providers, of all the medications you take. 7. Pain Medication from other Providers: You are not allowed to accept any additional pain medication from any other Doctor or Healthcare provider. There are two exceptions to this rule. (see below) In the event that you require additional pain medication, you are responsible for notifying us, as stated below. 8. Medication Agreement: You are responsible for carefully reading and following our Medication Agreement. This must be signed before receiving any prescriptions from our practice. Safely store a copy of your signed Agreement. Violations to the Agreement will result in no further prescriptions. (Additional copies of our Medication Agreement are available upon request.) 9. Laws, Rules, & Regulations: All patients are expected to follow all Federal and [employer], [employer], Rules, [employer]. Ignorance of the Laws does not constitute a valid excuse. The use of any illegal substances is prohibited. 10. Adopted CDC guidelines & recommendations: Target dosing levels will be at or below 60 MME/day. Use of benzodiazepines** is not recommended.  Exceptions: There are only two exceptions to the rule of not receiving pain medications from  other Healthcare Providers. 1. Exception #1 (Emergencies): In the event of an emergency (i.e.: accident requiring emergency care), you are allowed to receive additional pain medication. However, you are responsible for: As soon as you are able, call our office [phone], at any time of the day or night, and leave a message stating your name, the  date and nature of the emergency, and the name and dose of the medication prescribed. In the event that your call is answered by a member of our staff, make sure to document and save the date, time, and the name of the person that took your information.  2. Exception #2 (Planned Surgery): In the event that you are scheduled by another doctor or dentist to have any type of surgery or procedure, you are allowed (for a period no longer than 30 days), to receive additional pain medication, for the acute post-op pain. However, in this case, you are responsible for picking up a copy of our "Post-op Pain Management for Surgeons" handout, and giving it to your surgeon or dentist. This document is available at our office, and does not require an appointment to obtain it. Simply go to our office during business hours (Monday-Thursday from 8:00 AM to 4:00 PM) (Friday 8:00 AM to 12:00 Noon) or if you have a scheduled appointment with [place], prior to your surgery, and ask for it by name. In addition, you will need to provide [place] with your name, name of your surgeon, type of surgery, and date of procedure or surgery.  *Opioid medications include: morphine, codeine, oxycodone, oxymorphone, hydrocodone, hydromorphone, meperidine, tramadol, tapentadol, buprenorphine, fentanyl, methadone. **Benzodiazepine medications include: diazepam (Valium), alprazolam (Xanax), clonazepam (Klonopine), lorazepam (Ativan), clorazepate (Tranxene), chlordiazepoxide (Librium), estazolam (Prosom), oxazepam (Serax), temazepam (Restoril), triazolam (Halcion) (Last updated:  04/01/2017) ____________________________________________________________________________________________ Dennis Bast were given 3 prescriptions for Oxycodone  And one for Flexeril today.

## 2017-08-26 NOTE — Patient Instructions (Addendum)
____________________________________________________________________________________________  Medication Rules  Applies to: All patients receiving prescriptions (written or electronic).  Pharmacy of record: Pharmacy where electronic prescriptions will be sent. If written prescriptions are taken to a different pharmacy, please inform the nursing staff. The pharmacy listed in the electronic medical record should be the one where you would like electronic prescriptions to be sent.  Prescription refills: Only during scheduled appointments. Applies to both, written and electronic prescriptions.  NOTE: The following applies primarily to controlled substances (Opioid* Pain Medications).   Patient's responsibilities: 1. Pain Pills: Bring all pain pills to every appointment (except for procedure appointments). 2. Pill Bottles: Bring pills in original pharmacy bottle. Always bring newest bottle. Bring bottle, even if empty. 3. Medication refills: You are responsible for knowing and keeping track of what medications you need refilled. The day before your appointment, write a list of all prescriptions that need to be refilled. Bring that list to your appointment and give it to the admitting nurse. Prescriptions will be written only during appointments. If you forget a medication, it will not be "Called in", "Faxed", or "electronically sent". You will need to get another appointment to get these prescribed. 4. Prescription Accuracy: You are responsible for carefully inspecting your prescriptions before leaving our office. Have the discharge nurse carefully go over each prescription with you, before taking them home. Make sure that your name is accurately spelled, that your address is correct. Check the name and dose of your medication to make sure it is accurate. Check the number of pills, and the written instructions to make sure they are clear and accurate. Make sure that you are given enough medication to last  until your next medication refill appointment. 5. Taking Medication: Take medication as prescribed. Never take more pills than instructed. Never take medication more frequently than prescribed. Taking less pills or less frequently is permitted and encouraged, when it comes to controlled substances (written prescriptions).  6. Inform other Doctors: Always inform, all of your healthcare providers, of all the medications you take. 7. Pain Medication from other Providers: You are not allowed to accept any additional pain medication from any other Doctor or Healthcare provider. There are two exceptions to this rule. (see below) In the event that you require additional pain medication, you are responsible for notifying us, as stated below. 8. Medication Agreement: You are responsible for carefully reading and following our Medication Agreement. This must be signed before receiving any prescriptions from our practice. Safely store a copy of your signed Agreement. Violations to the Agreement will result in no further prescriptions. (Additional copies of our Medication Agreement are available upon request.) 9. Laws, Rules, & Regulations: All patients are expected to follow all Federal and State Laws, Statutes, Rules, & Regulations. Ignorance of the Laws does not constitute a valid excuse. The use of any illegal substances is prohibited. 10. Adopted CDC guidelines & recommendations: Target dosing levels will be at or below 60 MME/day. Use of benzodiazepines** is not recommended.  Exceptions: There are only two exceptions to the rule of not receiving pain medications from other Healthcare Providers. 1. Exception #1 (Emergencies): In the event of an emergency (i.e.: accident requiring emergency care), you are allowed to receive additional pain medication. However, you are responsible for: As soon as you are able, call our office [phone], at any time of the day or night, and leave a message stating your name, the  date and nature of the emergency, and the name and dose of the medication   prescribed. In the event that your call is answered by a member of our staff, make sure to document and save the date, time, and the name of the person that took your information.  2. Exception #2 (Planned Surgery): In the event that you are scheduled by another doctor or dentist to have any type of surgery or procedure, you are allowed (for a period no longer than 30 days), to receive additional pain medication, for the acute post-op pain. However, in this case, you are responsible for picking up a copy of our "Post-op Pain Management for Surgeons" handout, and giving it to your surgeon or dentist. This document is available at our office, and does not require an appointment to obtain it. Simply go to our office during business hours (Monday-Thursday from 8:00 AM to 4:00 PM) (Friday 8:00 AM to 12:00 Noon) or if you have a scheduled appointment with [place], prior to your surgery, and ask for it by name. In addition, you will need to provide [place] with your name, name of your surgeon, type of surgery, and date of procedure or surgery.  *Opioid medications include: morphine, codeine, oxycodone, oxymorphone, hydrocodone, hydromorphone, meperidine, tramadol, tapentadol, buprenorphine, fentanyl, methadone. **Benzodiazepine medications include: diazepam (Valium), alprazolam (Xanax), clonazepam (Klonopine), lorazepam (Ativan), clorazepate (Tranxene), chlordiazepoxide (Librium), estazolam (Prosom), oxazepam (Serax), temazepam (Restoril), triazolam (Halcion) (Last updated: 04/01/2017) ____________________________________________________________________________________________ Holly Spears were given 3 prescriptions for Oxycodone  And one for Flexeril today.

## 2017-08-26 NOTE — Progress Notes (Signed)
Nursing Pain Medication Assessment:  Safety precautions to be maintained throughout the outpatient stay will include: orient to surroundings, keep bed in low position, maintain call bell within reach at all times, provide assistance with transfer out of bed and ambulation.  Medication Inspection Compliance: Pill count conducted under aseptic conditions, in front of the patient. Neither the pills nor the bottle was removed from the patient's sight at any time. Once count was completed pills were immediately returned to the patient in their original bottle.  Medication: Oxycodone IR Pill/Patch Count: 172  of 240 pills remain Pill/Patch Appearance: Markings consistent with prescribed medication Bottle Appearance: Standard pharmacy container. Clearly labeled. Filled Date: 07/16* / 2019 Last Medication intake:  Today

## 2017-09-03 DIAGNOSIS — M0609 Rheumatoid arthritis without rheumatoid factor, multiple sites: Secondary | ICD-10-CM | POA: Diagnosis not present

## 2017-09-03 DIAGNOSIS — B370 Candidal stomatitis: Secondary | ICD-10-CM | POA: Diagnosis not present

## 2017-09-03 DIAGNOSIS — M5414 Radiculopathy, thoracic region: Secondary | ICD-10-CM | POA: Diagnosis not present

## 2017-09-03 DIAGNOSIS — M79642 Pain in left hand: Secondary | ICD-10-CM | POA: Diagnosis not present

## 2017-09-03 DIAGNOSIS — M79641 Pain in right hand: Secondary | ICD-10-CM | POA: Diagnosis not present

## 2017-10-08 DIAGNOSIS — Z23 Encounter for immunization: Secondary | ICD-10-CM | POA: Diagnosis not present

## 2017-11-25 DIAGNOSIS — E559 Vitamin D deficiency, unspecified: Secondary | ICD-10-CM | POA: Diagnosis not present

## 2017-11-25 DIAGNOSIS — E039 Hypothyroidism, unspecified: Secondary | ICD-10-CM | POA: Diagnosis not present

## 2017-11-25 DIAGNOSIS — E785 Hyperlipidemia, unspecified: Secondary | ICD-10-CM | POA: Diagnosis not present

## 2017-11-25 DIAGNOSIS — Z79899 Other long term (current) drug therapy: Secondary | ICD-10-CM | POA: Diagnosis not present

## 2017-11-29 DIAGNOSIS — E876 Hypokalemia: Secondary | ICD-10-CM | POA: Diagnosis not present

## 2017-11-29 DIAGNOSIS — F339 Major depressive disorder, recurrent, unspecified: Secondary | ICD-10-CM | POA: Insufficient documentation

## 2017-11-29 DIAGNOSIS — F419 Anxiety disorder, unspecified: Secondary | ICD-10-CM | POA: Insufficient documentation

## 2017-12-01 ENCOUNTER — Encounter: Payer: Self-pay | Admitting: Nurse Practitioner

## 2017-12-01 ENCOUNTER — Ambulatory Visit: Payer: 59 | Attending: Nurse Practitioner | Admitting: Nurse Practitioner

## 2017-12-01 VITALS — BP 135/97 | HR 103 | Temp 98.8°F | Resp 16 | Ht 66.0 in | Wt 180.0 lb

## 2017-12-01 DIAGNOSIS — F331 Major depressive disorder, recurrent, moderate: Secondary | ICD-10-CM | POA: Diagnosis not present

## 2017-12-01 DIAGNOSIS — Z79899 Other long term (current) drug therapy: Secondary | ICD-10-CM | POA: Diagnosis not present

## 2017-12-01 DIAGNOSIS — K219 Gastro-esophageal reflux disease without esophagitis: Secondary | ICD-10-CM | POA: Diagnosis not present

## 2017-12-01 DIAGNOSIS — Z882 Allergy status to sulfonamides status: Secondary | ICD-10-CM | POA: Diagnosis not present

## 2017-12-01 DIAGNOSIS — M4854XA Collapsed vertebra, not elsewhere classified, thoracic region, initial encounter for fracture: Secondary | ICD-10-CM | POA: Diagnosis not present

## 2017-12-01 DIAGNOSIS — Z880 Allergy status to penicillin: Secondary | ICD-10-CM | POA: Diagnosis not present

## 2017-12-01 DIAGNOSIS — M50223 Other cervical disc displacement at C6-C7 level: Secondary | ICD-10-CM | POA: Insufficient documentation

## 2017-12-01 DIAGNOSIS — Z79891 Long term (current) use of opiate analgesic: Secondary | ICD-10-CM | POA: Diagnosis not present

## 2017-12-01 DIAGNOSIS — M4802 Spinal stenosis, cervical region: Secondary | ICD-10-CM | POA: Insufficient documentation

## 2017-12-01 DIAGNOSIS — M47816 Spondylosis without myelopathy or radiculopathy, lumbar region: Secondary | ICD-10-CM

## 2017-12-01 DIAGNOSIS — Z87891 Personal history of nicotine dependence: Secondary | ICD-10-CM | POA: Diagnosis not present

## 2017-12-01 DIAGNOSIS — E039 Hypothyroidism, unspecified: Secondary | ICD-10-CM | POA: Diagnosis not present

## 2017-12-01 DIAGNOSIS — K449 Diaphragmatic hernia without obstruction or gangrene: Secondary | ICD-10-CM | POA: Insufficient documentation

## 2017-12-01 DIAGNOSIS — Z881 Allergy status to other antibiotic agents status: Secondary | ICD-10-CM | POA: Insufficient documentation

## 2017-12-01 DIAGNOSIS — M62838 Other muscle spasm: Secondary | ICD-10-CM

## 2017-12-01 DIAGNOSIS — Z9884 Bariatric surgery status: Secondary | ICD-10-CM | POA: Insufficient documentation

## 2017-12-01 DIAGNOSIS — M059 Rheumatoid arthritis with rheumatoid factor, unspecified: Secondary | ICD-10-CM | POA: Insufficient documentation

## 2017-12-01 DIAGNOSIS — F419 Anxiety disorder, unspecified: Secondary | ICD-10-CM | POA: Insufficient documentation

## 2017-12-01 DIAGNOSIS — M47812 Spondylosis without myelopathy or radiculopathy, cervical region: Secondary | ICD-10-CM | POA: Diagnosis not present

## 2017-12-01 DIAGNOSIS — Z5181 Encounter for therapeutic drug level monitoring: Secondary | ICD-10-CM | POA: Insufficient documentation

## 2017-12-01 DIAGNOSIS — E876 Hypokalemia: Secondary | ICD-10-CM | POA: Diagnosis not present

## 2017-12-01 DIAGNOSIS — G5603 Carpal tunnel syndrome, bilateral upper limbs: Secondary | ICD-10-CM | POA: Diagnosis not present

## 2017-12-01 DIAGNOSIS — M4726 Other spondylosis with radiculopathy, lumbar region: Secondary | ICD-10-CM | POA: Diagnosis not present

## 2017-12-01 DIAGNOSIS — G9619 Other disorders of meninges, not elsewhere classified: Secondary | ICD-10-CM | POA: Diagnosis not present

## 2017-12-01 DIAGNOSIS — G43109 Migraine with aura, not intractable, without status migrainosus: Secondary | ICD-10-CM | POA: Diagnosis not present

## 2017-12-01 DIAGNOSIS — M4804 Spinal stenosis, thoracic region: Secondary | ICD-10-CM | POA: Diagnosis not present

## 2017-12-01 DIAGNOSIS — Z885 Allergy status to narcotic agent status: Secondary | ICD-10-CM | POA: Diagnosis not present

## 2017-12-01 DIAGNOSIS — G894 Chronic pain syndrome: Secondary | ICD-10-CM | POA: Diagnosis not present

## 2017-12-01 MED ORDER — OXYCODONE HCL 5 MG PO TABS: 10.0000 mg | ORAL_TABLET | Freq: Four times a day (QID) | ORAL | 0 refills | Status: DC | PRN

## 2017-12-01 MED ORDER — CYCLOBENZAPRINE HCL 10 MG PO TABS: 10.0000 mg | ORAL_TABLET | Freq: Three times a day (TID) | ORAL | 0 refills | Status: DC

## 2017-12-01 NOTE — Progress Notes (Signed)
Patient's Name: Holly Spears  MRN: [id number]  Referring Provider: Judene Companion, MD  DOB: 12/30/1970  PCP: Judene Companion, MD  DOS: 12/01/2017  Note by: Vevelyn Francois NP  Service setting: Ambulatory outpatient  Specialty: Interventional Pain Management  Location: [facility] (AMB) Pain Management Facility    Patient type: Established    Primary Reason(s) for Visit: Encounter for prescription drug management. (Level of risk: moderate)  CC: Back Pain (mid )  HPI  Ms. Redd is a 47 y.o. year old, female patient, who comes today for a medication management evaluation. She has Long term current use of opiate analgesic; Long term prescription opiate use; Opiate use (60 MME/Day); Encounter for therapeutic drug level monitoring; Opiate dependence ([place]); Encounter for chronic pain management; Muscle spasm; Myofascial pain; Acquired hypothyroidism; Intracervical pessary; Major depressive disorder, recurrent episode, moderate ([place]); Migraine with aura; Rheumatoid arthritis with positive rheumatoid factor ([place]); Musculoskeletal pain; Chronic pain syndrome; Chronic neck pain; Cervical spondylosis (C5-6 and C6-7); Cervical disc herniation (C6-7) (Left); Bulge of cervical disc without myelopathy (C5-6); Cervicogenic headache; Chronic cervical radicular pain (Bilateral) (R>L); Chronic pain of both shoulders (Bilateral) (R>L); Chronic low back pain (Location of Primary Source of Pain) (Bilateral) (R>L); Failed back surgical syndrome; 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; Lumbar spondylosis (L3-4, L4-5, and L5-S1); Lumbar bulging disc (L3-4, L4-5, and L5-S1); Chronic lumbar radicular pain (Bilateral) (R>L); Chronic knee pain (Location of Secondary source of pain) (Bilateral) (R>L); Chronic upper back pain (Left); Thoracic disc herniation (Large, Left paracentral T10-11 disc herniation); Thoracic spinal stenosis (Severe, Left, T10-11 Lateral Recess Stenosis); Thoracic foraminal stenosis (Severe Left T10-11); History of bilateral  carpal tunnel release (Bilateral); Chronic hand pain (Bilateral) (R>L); Esophageal reflux; History of psychiatric disorder; History of psychiatric care; Anxiety disorder; Depression; History of shingles; History of gastric bypass; History of Roux-en-Y gastric bypass; Hypokalemia; H/O eye disorder; Long term prescription benzodiazepine use; Candidiasis of mouth; Muscle weakness; Tick bite; Hypothyroidism; Pain in limb; Neck pain; Spasm of back muscles; Chronic back pain; Problems influencing health status; Pharmacologic therapy; and Recurrent major depression ([place]) on their problem list. Her primarily concern today is the Back Pain (mid )  Pain Assessment: Location: Mid Back Radiating: down the right leg to the foot  Onset: More than a month ago Duration: Chronic pain Quality: Discomfort, Aching, Constant Severity: 2 /10 (subjective, self-reported pain score)  Note: Reported level is compatible with observation.                          Effect on ADL: some days are worse than others Timing: Constant Modifying factors: medications, heat and massage  BP: (!) 135/97  HR: (!) 103  Ms. Frasier was last scheduled for an appointment on 08/26/2017 for medication management. During today's appointment we reviewed Ms. Gerard's chronic pain status, as well as her outpatient medication regimen. She admits that her pain is stable. She states did have hypokalemia.   The patient  reports that she does not use drugs. Her body mass index is 29.05 kg/m.  Further details on both, my assessment(s), as well as the proposed treatment plan, please see below.  Controlled Substance Pharmacotherapy Assessment REMS (Risk Evaluation and Mitigation Strategy)  Analgesic:Oxycodone IR 5 mg 2 tablets every 6 hours (40 mg/day of oxycodone per day) MME/day:60 mg/day  Janett Billow, RN  12/01/2017 11:11 AM  Sign at close encounter Nursing Pain Medication Assessment:  Safety precautions to be maintained throughout the  outpatient stay will include: orient to surroundings,  keep bed in low position, maintain call bell within reach at all times, provide assistance with transfer out of bed and ambulation.  Medication Inspection Compliance: Pill count conducted under aseptic conditions, in front of the patient. Neither the pills nor the bottle was removed from the patient's sight at any time. Once count was completed pills were immediately returned to the patient in their original bottle.  Medication: Oxycodone IR Pill/Patch Count: 116 of 240 pills remain Pill/Patch Appearance: Markings consistent with prescribed medication Bottle Appearance: Standard pharmacy container. Clearly labeled. Filled Date: 10 / 14 / 2019 Last Medication intake:  Today   Pharmacokinetics: Liberation and absorption (onset of action): WNL Distribution (time to peak effect): WNL Metabolism and excretion (duration of action): WNL         Pharmacodynamics: Desired effects: Analgesia: Ms. Hayse reports >50% benefit. Functional ability: Patient reports that medication allows her to accomplish basic ADLs Clinically meaningful improvement in function (CMIF): Sustained CMIF goals met Perceived effectiveness: Described as relatively effective, allowing for increase in activities of daily living (ADL) Undesirable effects: Side-effects or Adverse reactions: None reported Monitoring: [place] PMP: Online review of the past 90-monthperiod conducted. Compliant with practice rules and regulations Last UDS on record: Summary  Date Value Ref Range Status  05/27/2017 FINAL  Final    Comment:    ==================================================================== TOXASSURE SELECT 13 (MW) ==================================================================== Test                             Result       Flag       Units Drug Present and Declared for Prescription Verification   Oxycodone                      865          EXPECTED   ng/mg creat    Noroxycodone                   3115         EXPECTED   ng/mg creat    Sources of oxycodone include scheduled prescription medications.    Noroxycodone is an expected metabolite of oxycodone. Drug Absent but Declared for Prescription Verification   Diazepam                       Not Detected UNEXPECTED ng/mg creat ==================================================================== Test                      Result    Flag   Units      Ref Range   Creatinine              20               mg/dL      >=20 ==================================================================== Declared Medications:  The flagging and interpretation on this report are based on the  following declared medications.  Unexpected results may arise from  inaccuracies in the declared medications.  **Note: The testing scope of this panel includes these medications:  Diazepam (Valium)  Oxycodone  **Note: The testing scope of this panel does not include following  reported medications:  Bupropion (Wellbutrin)  Buspirone  Calcium carbonate (Calcium Carb/Vitamin D)  Citalopram (Celexa)  Cyclobenzaprine (Flexeril)  Epinephrine (EpiPen)  Hydroxychloroquine (Plaquenil)  Levonorgestrel (Mirena)  Multivitamin  Potassium (Klor-Con)  Prednisone (Deltasone)  Ranitidine (Zantac)  Thyroid: Liothyronine/Levothyroxine  Vitamin B (Biotin)  Vitamin D (Calcium Carb/Vitamin D)  Vitamin D3 ==================================================================== For clinical consultation, please call [phone]. ====================================================================    UDS interpretation: Compliant          Medication Assessment Form: Reviewed. Patient indicates being compliant with therapy Treatment compliance: Compliant Risk Assessment Profile: Aberrant behavior: See prior evaluations. None observed or detected today Comorbid factors increasing risk of overdose: See prior notes. No additional risks detected  today Opioid risk tool (ORT) (Total Score): 0 Personal History of Substance Abuse (SUD-Substance use disorder):  Alcohol: Negative  Illegal Drugs: Negative  Rx Drugs: Negative  ORT Risk Level calculation: Low Risk Risk of substance use disorder (SUD): Low Opioid Risk Tool - 12/01/17 1111      Family History of Substance Abuse   Alcohol  Negative    Illegal Drugs  Negative    Rx Drugs  Negative      Personal History of Substance Abuse   Alcohol  Negative    Illegal Drugs  Negative    Rx Drugs  Negative      Psychological Disease   Psychological Disease  Negative    Depression  Negative      Total Score   Opioid Risk Tool Scoring  0    Opioid Risk Interpretation  Low Risk      ORT Scoring interpretation table:  Score <3 = Low Risk for SUD  Score between 4-7 = Moderate Risk for SUD  Score >8 = High Risk for Opioid Abuse   Risk Mitigation Strategies:  Patient Counseling: Covered Patient-Prescriber Agreement (PPA): Present and active  Notification to other healthcare providers: Done  Pharmacologic Plan: No change in therapy, at this time.             Laboratory Chemistry  Inflammation Markers (CRP: Acute Phase) (ESR: Chronic Phase) Lab Results  Component Value Date   CRP <0.5 12/31/2014   ESRSEDRATE 5 12/31/2014                         Rheumatology Markers No results found for: RF, ANA, LABURIC, URICUR, LYMEIGGIGMAB, LYMEABIGMQN, HLAB27                      Renal Function Markers Lab Results  Component Value Date   BUN 7 03/27/2015   CREATININE 0.58 03/27/2015   GFRAA >60 03/27/2015   GFRNONAA >60 03/27/2015                             Hepatic Function Markers Lab Results  Component Value Date   AST 26 03/27/2015   ALT 26 03/27/2015   ALBUMIN 4.3 03/27/2015   ALKPHOS 78 03/27/2015                        Electrolytes Lab Results  Component Value Date   NA 139 03/27/2015   K 3.1 (L) 03/27/2015   CL 104 03/27/2015   CALCIUM 8.7 (L) 03/27/2015   MG  2.0 12/31/2014                        Neuropathy Markers No results found for: VITAMINB12, FOLATE, HGBA1C, HIV                      CNS Tests No results found for: COLORCSF, APPEARCSF, RBCCOUNTCSF, WBCCSF, POLYSCSF, LYMPHSCSF, EOSCSF, PROTEINCSF, GLUCCSF, JCVIRUS, CSFOLI, IGGCSF  Bone Pathology Markers Lab Results  Component Value Date   VD25OH 29.3 (L) 12/31/2014                         Coagulation Parameters No results found for: INR, LABPROT, APTT, PLT, DDIMER                      Cardiovascular Markers No results found for: BNP, CKTOTAL, CKMB, TROPONINI, HGB, HCT                       CA Markers No results found for: CEA, CA125, LABCA2                      Note: Lab results reviewed.  Recent Diagnostic Imaging Results  DG Lumbar Spine Complete W/Bend CLINICAL DATA:  Chronic pain, no injury Please comment in the report about spinal instability (>4 mm displacement), in addition to any: Spondylolisthesis (specify retro- or antero- and Grade level) (describe displacement in millimeters )  EXAM: LUMBAR SPINE - COMPLETE WITH BENDING VIEWS  COMPARISON:  None.  FINDINGS: There is mild degenerative change in the facet joints of the lower lumbar spine. No acute fracture or subluxation. Surgical clips are noted in the right upper quadrant the abdomen. Intrauterine device overlies the central pelvis.  IMPRESSION: Mild lower lumbar degenerative changes.  No spondylolisthesis.  Electronically Signed   By: Nolon Nations M.D.   On: 02/18/2017 15:47 DG Thoracic Spine 2 View CLINICAL DATA:  Chronic pain, no injury Please comment in the report about spinal instability (>4 mm displacement), in addition to any: Spondylolisthesis (specify retro- or antero- and Grade level) (describe displacement in millimeters);  EXAM: THORACIC SPINE 2 VIEWS  COMPARISON:  None.  FINDINGS: There is mild S shaped scoliosis of the thoracic spine not associated with  vertebral body anomalies. Mild degenerative changes are seen in the thoracic spine. No spondylolisthesis. Mild anterior wedge morphology noted at T11 is of indeterminate age. No acute fracture.  IMPRESSION: Mild midthoracic spondylosis.  No spondylolisthesis.  Mild anterior wedging of T11, age indeterminate.  Electronically Signed   By: Nolon Nations M.D.   On: 02/18/2017 15:44  Complexity Note: Imaging results reviewed. Results shared with Ms. Frew, using Layman's terms.                         Meds   Current Outpatient Medications:  .  albuterol (VENTOLIN HFA) 108 (90 Base) MCG/ACT inhaler, Take 1 puff by mouth as needed., Disp: , Rfl:  .  Biotin 10 MG CAPS, Take 1 tablet by mouth daily. , Disp: , Rfl:  .  buPROPion (WELLBUTRIN) 100 MG tablet, Take 100 mg by mouth daily., Disp: , Rfl:  .  busPIRone (BUSPAR) 5 MG tablet, Take 5 mg by mouth daily., Disp: , Rfl:  .  Calcium Carbonate-Vitamin D 600-125 MG-UNIT TABS, Take 1 tablet by mouth daily., Disp: , Rfl:  .  Cholecalciferol (VITAMIN D3) 1000 units CAPS, Take 1 capsule by mouth daily. , Disp: , Rfl:  .  citalopram (CELEXA) 20 MG tablet, Take 20 mg by mouth daily., Disp: , Rfl:  .  [START ON 12/15/2017] cyclobenzaprine (FLEXERIL) 10 MG tablet, Take 1 tablet (10 mg total) by mouth 3 (three) times daily., Disp: 270 tablet, Rfl: 0 .  diazepam (VALIUM) 5 MG tablet, Take 5 mg by mouth as needed. , Disp: ,  Rfl: 1 .  EPINEPHrine 0.3 mg/0.3 mL IJ SOAJ injection, Frequency:PRN   Dosage:0.0     Instructions:  Note:Dose: 0.3MG/0.3, Disp: , Rfl:  .  hydroxychloroquine (PLAQUENIL) 200 MG tablet, Take 200 mg by mouth 2 (two) times daily., Disp: , Rfl:  .  ibuprofen (ADVIL,MOTRIN) 800 MG tablet, Take 800 mg by mouth as needed., Disp: , Rfl:  .  levonorgestrel (MIRENA) 20 MCG/24HR IUD, by Intrauterine route., Disp: , Rfl:  .  Multiple Vitamin (MULTI-VITAMINS) TABS, Take 1 tablet by mouth 3 (three) times daily. , Disp: , Rfl:  .  NATURE-THROID  48.75 MG TABS, Take 48.75 mg by mouth daily., Disp: , Rfl:  .  [START ON 02/13/2018] oxyCODONE (OXY IR/ROXICODONE) 5 MG immediate release tablet, Take 2 tablets (10 mg total) by mouth every 6 (six) hours as needed for severe pain., Disp: 240 tablet, Rfl: 0 .  potassium chloride SA (K-DUR,KLOR-CON) 20 MEQ tablet, Take 20 mEq by mouth 4 (four) times daily. , Disp: , Rfl:  .  predniSONE (DELTASONE) 5 MG tablet, Take 2.5 mg by mouth daily with breakfast. , Disp: , Rfl:  .  ranitidine (ZANTAC) 150 MG tablet, Take 150 mg by mouth 2 (two) times daily. , Disp: , Rfl:  .  buPROPion (WELLBUTRIN) 100 MG tablet, Take 200 mg 2 (two) times daily by mouth. , Disp: , Rfl:  .  [START ON 01/14/2018] oxyCODONE (OXY IR/ROXICODONE) 5 MG immediate release tablet, Take 2 tablets (10 mg total) by mouth every 6 (six) hours as needed for severe pain., Disp: 240 tablet, Rfl: 0 .  [START ON 12/15/2017] oxyCODONE (OXY IR/ROXICODONE) 5 MG immediate release tablet, Take 2 tablets (10 mg total) by mouth every 6 (six) hours as needed for severe pain., Disp: 240 tablet, Rfl: 0  ROS  Constitutional: Denies any fever or chills Gastrointestinal: No reported hemesis, hematochezia, vomiting, or acute GI distress Musculoskeletal: Denies any acute onset joint swelling, redness, loss of ROM, or weakness Neurological: No reported episodes of acute onset apraxia, aphasia, dysarthria, agnosia, amnesia, paralysis, loss of coordination, or loss of consciousness  Allergies  Ms. Hipple is allergic to bee venom; honey bee venom; penicillins; shellfish allergy; shellfish-derived products; sulfa antibiotics; erythromycin; sulfamethizole; zaleplon; codeine; erythromycin base; and trazodone.  PFSH  Drug: Ms. Fonseca  reports that she does not use drugs. Alcohol:  reports that she does not drink alcohol. Tobacco:  reports that she has quit smoking. She has never used smokeless tobacco. Medical:  has a past medical history of Acid reflux (12/15/2011),  Anxiety, generalized (12/15/2011), Arthritis, Cervical pain (12/15/2011), Depression, GERD (gastroesophageal reflux disease), Hiatal hernia, LBP (low back pain) (12/15/2011), Migraine, Muscle ache (12/15/2011), Pain (12/27/2012), Rheumatoid arthritis ([place]) (12/15/2011), and Scratched cornea (10/25/2015). Surgical: Ms. Weyman  has a past surgical history that includes Cholecystectomy. Family: family history includes Depression in her mother; Diabetes in her father; Hyperlipidemia in her mother; Hypertension in her father and mother.  Constitutional Exam  General appearance: Well nourished, well developed, and well hydrated. In no apparent acute distress Vitals:   12/01/17 1059  BP: (!) 135/97  Pulse: (!) 103  Resp: 16  Temp: 98.8 F (37.1 C)  TempSrc: Oral  SpO2: 99%  Weight: 180 lb (81.6 kg)  Height: 5' 6"  (1.676 m)  Psych/Mental status: Alert, oriented x 3 (person, place, & time)       Eyes: PERLA Respiratory: No evidence of acute respiratory distress  Cervical Spine Area Exam  Skin & Axial Inspection: No masses, redness, edema,  swelling, or associated skin lesions Alignment: Symmetrical Functional ROM: Unrestricted ROM      Stability: No instability detected Muscle Tone/Strength: Functionally intact. No obvious neuro-muscular anomalies detected. Sensory (Neurological): Unimpaired Palpation: No palpable anomalies              Upper Extremity (UE) Exam    Side: Right upper extremity  Side: Left upper extremity  Skin & Extremity Inspection: Skin color, temperature, and hair growth are WNL. No peripheral edema or cyanosis. No masses, redness, swelling, asymmetry, or associated skin lesions. No contractures.  Skin & Extremity Inspection: Skin color, temperature, and hair growth are WNL. No peripheral edema or cyanosis. No masses, redness, swelling, asymmetry, or associated skin lesions. No contractures.  Functional ROM: Unrestricted ROM          Functional ROM: Unrestricted ROM           Muscle Tone/Strength: Functionally intact. No obvious neuro-muscular anomalies detected.  Muscle Tone/Strength: Functionally intact. No obvious neuro-muscular anomalies detected.  Sensory (Neurological): Unimpaired          Sensory (Neurological): Unimpaired          Palpation: No palpable anomalies              Palpation: No palpable anomalies              Provocative Test(s):  Phalen's test: deferred Tinel's test: deferred Apley's scratch test (touch opposite shoulder):  Action 1 (Across chest): deferred Action 2 (Overhead): deferred Action 3 (LB reach): deferred   Provocative Test(s):  Phalen's test: deferred Tinel's test: deferred Apley's scratch test (touch opposite shoulder):  Action 1 (Across chest): deferred Action 2 (Overhead): deferred Action 3 (LB reach): deferred    Thoracic Spine Area Exam  Skin & Axial Inspection: No masses, redness, or swelling Alignment: Symmetrical Functional ROM: Unrestricted ROM Stability: No instability detected Muscle Tone/Strength: Functionally intact. No obvious neuro-muscular anomalies detected. Sensory (Neurological): Unimpaired Muscle strength & Tone: No palpable anomalies  Lumbar Spine Area Exam  Skin & Axial Inspection: No masses, redness, or swelling Alignment: Symmetrical Functional ROM: Unrestricted ROM       Stability: No instability detected Muscle Tone/Strength: Functionally intact. No obvious neuro-muscular anomalies detected. Sensory (Neurological): Unimpaired Palpation: No palpable anomalies       Provocative Tests: Hyperextension/rotation test: deferred today       Lumbar quadrant test (Kemp's test): deferred today       Lateral bending test: deferred today       Patrick's Maneuver: deferred today                   FABER test: deferred today                   S-I anterior distraction/compression test: deferred today         S-I lateral compression test: deferred today         S-I Thigh-thrust test: deferred today          S-I Gaenslen's test: deferred today          Gait & Posture Assessment  Ambulation: Unassisted Gait: Relatively normal for age and body habitus Posture: WNL   Lower Extremity Exam    Side: Right lower extremity  Side: Left lower extremity  Stability: No instability observed          Stability: No instability observed          Skin & Extremity Inspection: Skin color, temperature, and hair growth are WNL. No  peripheral edema or cyanosis. No masses, redness, swelling, asymmetry, or associated skin lesions. No contractures.  Skin & Extremity Inspection: Skin color, temperature, and hair growth are WNL. No peripheral edema or cyanosis. No masses, redness, swelling, asymmetry, or associated skin lesions. No contractures.  Functional ROM: Unrestricted ROM                  Functional ROM: Unrestricted ROM                  Muscle Tone/Strength: Functionally intact. No obvious neuro-muscular anomalies detected.  Muscle Tone/Strength: Functionally intact. No obvious neuro-muscular anomalies detected.  Sensory (Neurological): Unimpaired  Sensory (Neurological): Unimpaired  Palpation: No palpable anomalies  Palpation: No palpable anomalies   Assessment  Primary Diagnosis & Pertinent Problem List: The primary encounter diagnosis was Lumbar spondylosis (L3-4, L4-5, and L5-S1). Diagnoses of Cervical spondylosis (C5-6 and C6-7), Thoracic foraminal stenosis (Severe Left T10-11), Muscle spasm, and Chronic pain syndrome were also pertinent to this visit.  Status Diagnosis  Controlled Controlled Controlled 1. Lumbar spondylosis (L3-4, L4-5, and L5-S1)   2. Cervical spondylosis (C5-6 and C6-7)   3. Thoracic foraminal stenosis (Severe Left T10-11)   4. Muscle spasm   5. Chronic pain syndrome     Problems updated and reviewed during this visit: Problem  Recurrent Major Depression ([facility])  Rheumatoid Arthritis With Positive Rheumatoid Factor ([facility])   Rheumatologist: Dr Tera Mater.  Overview:  continue  rheumatology followup with Dr Tera Mater.   Anxiety Disorder   Overview:  [place], [place]    Plan of Care  Pharmacotherapy (Medications Ordered): Meds ordered this encounter  Medications  . cyclobenzaprine (FLEXERIL) 10 MG tablet    Sig: Take 1 tablet (10 mg total) by mouth 3 (three) times daily.    Dispense:  270 tablet    Refill:  0    Do not place this medication, or any other prescription from our practice, on "Automatic Refill". Patient may have prescription filled one day early if pharmacy is closed on scheduled refill date.    Order Specific Question:   Supervising Provider    Answer:   Milinda Pointer [phone]  . oxyCODONE (OXY IR/ROXICODONE) 5 MG immediate release tablet    Sig: Take 2 tablets (10 mg total) by mouth every 6 (six) hours as needed for severe pain.    Dispense:  240 tablet    Refill:  0    Do not place this medication, or any other prescription from our practice, on "Automatic Refill". Patient may have prescription filled one day early if pharmacy is closed on scheduled refill date.    Order Specific Question:   Supervising Provider    Answer:   Milinda Pointer [phone]  . oxyCODONE (OXY IR/ROXICODONE) 5 MG immediate release tablet    Sig: Take 2 tablets (10 mg total) by mouth every 6 (six) hours as needed for severe pain.    Dispense:  240 tablet    Refill:  0    Do not place this medication, or any other prescription from our practice, on "Automatic Refill". Patient may have prescription filled one day early if pharmacy is closed on scheduled refill date.    Order Specific Question:   Supervising Provider    Answer:   Milinda Pointer [phone]  . oxyCODONE (OXY IR/ROXICODONE) 5 MG immediate release tablet    Sig: Take 2 tablets (10 mg total) by mouth every 6 (six) hours as needed for severe pain.    Dispense:  240 tablet  Refill:  0    Do not place this medication, or any other prescription from our practice, on "Automatic Refill". Patient  may have prescription filled one day early if pharmacy is closed on scheduled refill date.    Order Specific Question:   Supervising Provider    Answer:   Milinda Pointer [[id number]]   New Prescriptions   No medications on file   Medications administered today: Namrata Hazan had no medications administered during this visit. Lab-work, procedure(s), and/or referral(s): No orders of the defined types were placed in this encounter.  Imaging and/or referral(s): None  Interventional therapies: Planned, scheduled, and/or pending: Not at this time.   Considering: Diagnostic bilateral lumbar facet block  Possible bilateral lumbar facet radiofrequency ablation  Diagnostic right-sided L5-S1 lumbar epidural steroid injection  Possible right-sided L5-S1 versus S1 transforaminal epidural steroid injection  Diagnostic bilateral intra-articular knee injections with local anesthetic and steroid  Possible series of 5 intra-articular Hyalgan knee injections  Diagnostic bilateral Genicular nerve block  Possible bilateral Genicular nerve radiofrequency ablation   Palliative PRN treatment(s): Diagnostic bilateral lumbar facet block  Diagnostic right-sided L5-S1 lumbar epidural steroid injection  Diagnostic bilateral intra-articular knee injections with local anesthetic and steroid  Diagnostic bilateral Genicular nerve block    Provider-requested follow-up: Return in about 3 months (around 03/03/2018) for MedMgmt.  Future Appointments  Date Time Provider [place]  03/03/2018 11:00 AM Vevelyn Francois, NP [facility] None   Primary Care Physician: Judene Companion, MD Location: [facility] Note by: Vevelyn Francois NP Date: 12/01/2017; Time: 4:24 PM  Pain Score Disclaimer: We use the NRS-11 scale. This is a self-reported, subjective measurement of pain severity with only modest accuracy. It is used primarily to identify changes within a particular  patient. It must be understood that outpatient pain scales are significantly less accurate that those used for research, where they can be applied under ideal controlled circumstances with minimal exposure to variables. In reality, the score is likely to be a combination of pain intensity and pain affect, where pain affect describes the degree of emotional arousal or changes in action readiness caused by the sensory experience of pain. Factors such as social and work situation, setting, emotional state, anxiety levels, expectation, and prior pain experience may influence pain perception and show large inter-individual differences that may also be affected by time variables.  Patient instructions provided during this appointment: Patient Instructions   ____________________________________________________________________________________________  Medication Rules  Applies to: All patients receiving prescriptions (written or electronic).  Pharmacy of record: Pharmacy where electronic prescriptions will be sent. If written prescriptions are taken to a different pharmacy, please inform the nursing staff. The pharmacy listed in the electronic medical record should be the one where you would like electronic prescriptions to be sent.  Prescription refills: Only during scheduled appointments. Applies to both, written and electronic prescriptions.  NOTE: The following applies primarily to controlled substances (Opioid* Pain Medications).   Patient's responsibilities: 1. Pain Pills: Bring all pain pills to every appointment (except for procedure appointments). 2. Pill Bottles: Bring pills in original pharmacy bottle. Always bring newest bottle. Bring bottle, even if empty. 3. Medication refills: You are responsible for knowing and keeping track of what medications you need refilled. The day before your appointment, write a list of all prescriptions that need to be refilled. Bring that list to your  appointment and give it to the admitting nurse. Prescriptions will be written only during appointments. If you forget a medication, it  will not be "Called in", "Faxed", or "electronically sent". You will need to get another appointment to get these prescribed. 4. Prescription Accuracy: You are responsible for carefully inspecting your prescriptions before leaving our office. Have the discharge nurse carefully go over each prescription with you, before taking them home. Make sure that your name is accurately spelled, that your address is correct. Check the name and dose of your medication to make sure it is accurate. Check the number of pills, and the written instructions to make sure they are clear and accurate. Make sure that you are given enough medication to last until your next medication refill appointment. 5. Taking Medication: Take medication as prescribed. Never take more pills than instructed. Never take medication more frequently than prescribed. Taking less pills or less frequently is permitted and encouraged, when it comes to controlled substances (written prescriptions).  6. Inform other Doctors: Always inform, all of your healthcare providers, of all the medications you take. 7. Pain Medication from other Providers: You are not allowed to accept any additional pain medication from any other Doctor or Healthcare provider. There are two exceptions to this rule. (see below) In the event that you require additional pain medication, you are responsible for notifying us, as stated below. 8. Medication Agreement: You are responsible for carefully reading and following our Medication Agreement. This must be signed before receiving any prescriptions from our practice. Safely store a copy of your signed Agreement. Violations to the Agreement will result in no further prescriptions. (Additional copies of our Medication Agreement are available upon request.) 9. Laws, Rules, & Regulations: All patients are  expected to follow all Federal and [employer], Rules, [employer]. Ignorance of the Laws does not constitute a valid excuse. The use of any illegal substances is prohibited. 10. Adopted CDC guidelines & recommendations: Target dosing levels will be at or below 60 MME/day. Use of benzodiazepines** is not recommended.  Exceptions: There are only two exceptions to the rule of not receiving pain medications from other Healthcare Providers. 1. Exception #1 (Emergencies): In the event of an emergency (i.e.: accident requiring emergency care), you are allowed to receive additional pain medication. However, you are responsible for: As soon as you are able, call our office [phone], at any time of the day or night, and leave a message stating your name, the date and nature of the emergency, and the name and dose of the medication prescribed. In the event that your call is answered by a member of our staff, make sure to document and save the date, time, and the name of the person that took your information.  2. Exception #2 (Planned Surgery): In the event that you are scheduled by another doctor or dentist to have any type of surgery or procedure, you are allowed (for a period no longer than 30 days), to receive additional pain medication, for the acute post-op pain. However, in this case, you are responsible for picking up a copy of our "Post-op Pain Management for Surgeons" handout, and giving it to your surgeon or dentist. This document is available at our office, and does not require an appointment to obtain it. Simply go to our office during business hours (Monday-Thursday from 8:00 AM to 4:00 PM) (Friday 8:00 AM to 12:00 Noon) or if you have a scheduled appointment with [place], prior to your surgery, and ask for it by name. In addition, you will need to provide [place] with your name, name of your surgeon, type of  surgery, and date of procedure or surgery.  *Opioid medications include: morphine, codeine,  oxycodone, oxymorphone, hydrocodone, hydromorphone, meperidine, tramadol, tapentadol, buprenorphine, fentanyl, methadone. **Benzodiazepine medications include: diazepam (Valium), alprazolam (Xanax), clonazepam (Klonopine), lorazepam (Ativan), clorazepate (Tranxene), chlordiazepoxide (Librium), estazolam (Prosom), oxazepam (Serax), temazepam (Restoril), triazolam (Halcion) (Last updated: 04/01/2017) ____________________________________________________________________________________________   BMI Assessment: Estimated body mass index is 29.05 kg/m as calculated from the following:   Height as of this encounter: 5' 6"  (1.676 m).   Weight as of this encounter: 180 lb (81.6 kg).  BMI interpretation table: BMI level Category Range association with higher incidence of chronic pain  <18 kg/m2 Underweight   18.5-24.9 kg/m2 Ideal body weight   25-29.9 kg/m2 Overweight Increased incidence by 20%  30-34.9 kg/m2 Obese (Class I) Increased incidence by 68%  35-39.9 kg/m2 Severe obesity (Class II) Increased incidence by 136%  >40 kg/m2 Extreme obesity (Class III) Increased incidence by 254%   Patient's current BMI Ideal Body weight  Body mass index is 29.05 kg/m. Ideal body weight: 59.3 kg (130 lb 11.7 oz) Adjusted ideal body weight: 68.2 kg (150 lb 7 oz)   BMI Readings from Last 4 Encounters:  12/01/17 29.05 kg/m  08/26/17 25.84 kg/m  05/27/17 27.41 kg/m  03/01/17 28.25 kg/m   Wt Readings from Last 4 Encounters:  12/01/17 180 lb (81.6 kg)  08/26/17 165 lb (74.8 kg)  05/27/17 175 lb (79.4 kg)  03/01/17 175 lb (79.4 kg)   Oxycodone 5 mg x 3 months to begin filling on 12/15/17 escribed to your pharmacy  Flexeril x 3 months also escribed to pharmacy

## 2017-12-01 NOTE — Progress Notes (Signed)
Nursing Pain Medication Assessment:  Safety precautions to be maintained throughout the outpatient stay will include: orient to surroundings, keep bed in low position, maintain call bell within reach at all times, provide assistance with transfer out of bed and ambulation.  Medication Inspection Compliance: Pill count conducted under aseptic conditions, in front of the patient. Neither the pills nor the bottle was removed from the patient's sight at any time. Once count was completed pills were immediately returned to the patient in their original bottle.  Medication: Oxycodone IR Pill/Patch Count: 116 of 240 pills remain Pill/Patch Appearance: Markings consistent with prescribed medication Bottle Appearance: Standard pharmacy container. Clearly labeled. Filled Date: 10 / 14 / 2019 Last Medication intake:  Today

## 2017-12-01 NOTE — Patient Instructions (Addendum)
____________________________________________________________________________________________  Medication Rules  Applies to: All patients receiving prescriptions (written or electronic).  Pharmacy of record: Pharmacy where electronic prescriptions will be sent. If written prescriptions are taken to a different pharmacy, please inform the nursing staff. The pharmacy listed in the electronic medical record should be the one where you would like electronic prescriptions to be sent.  Prescription refills: Only during scheduled appointments. Applies to both, written and electronic prescriptions.  NOTE: The following applies primarily to controlled substances (Opioid* Pain Medications).   Patient's responsibilities: 1. Pain Pills: Bring all pain pills to every appointment (except for procedure appointments). 2. Pill Bottles: Bring pills in original pharmacy bottle. Always bring newest bottle. Bring bottle, even if empty. 3. Medication refills: You are responsible for knowing and keeping track of what medications you need refilled. The day before your appointment, write a list of all prescriptions that need to be refilled. Bring that list to your appointment and give it to the admitting nurse. Prescriptions will be written only during appointments. If you forget a medication, it will not be "Called in", "Faxed", or "electronically sent". You will need to get another appointment to get these prescribed. 4. Prescription Accuracy: You are responsible for carefully inspecting your prescriptions before leaving our office. Have the discharge nurse carefully go over each prescription with you, before taking them home. Make sure that your name is accurately spelled, that your address is correct. Check the name and dose of your medication to make sure it is accurate. Check the number of pills, and the written instructions to make sure they are clear and accurate. Make sure that you are given enough medication to last  until your next medication refill appointment. 5. Taking Medication: Take medication as prescribed. Never take more pills than instructed. Never take medication more frequently than prescribed. Taking less pills or less frequently is permitted and encouraged, when it comes to controlled substances (written prescriptions).  6. Inform other Doctors: Always inform, all of your healthcare providers, of all the medications you take. 7. Pain Medication from other Providers: You are not allowed to accept any additional pain medication from any other Doctor or Healthcare provider. There are two exceptions to this rule. (see below) In the event that you require additional pain medication, you are responsible for notifying us, as stated below. 8. Medication Agreement: You are responsible for carefully reading and following our Medication Agreement. This must be signed before receiving any prescriptions from our practice. Safely store a copy of your signed Agreement. Violations to the Agreement will result in no further prescriptions. (Additional copies of our Medication Agreement are available upon request.) 9. Laws, Rules, & Regulations: All patients are expected to follow all Federal and State Laws, Statutes, Rules, & Regulations. Ignorance of the Laws does not constitute a valid excuse. The use of any illegal substances is prohibited. 10. Adopted CDC guidelines & recommendations: Target dosing levels will be at or below 60 MME/day. Use of benzodiazepines** is not recommended.  Exceptions: There are only two exceptions to the rule of not receiving pain medications from other Healthcare Providers. 1. Exception #1 (Emergencies): In the event of an emergency (i.e.: accident requiring emergency care), you are allowed to receive additional pain medication. However, you are responsible for: As soon as you are able, call our office [phone], at any time of the day or night, and leave a message stating your name, the  date and nature of the emergency, and the name and dose of the medication   prescribed. In the event that your call is answered by a member of our staff, make sure to document and save the date, time, and the name of the person that took your information.  2. Exception #2 (Planned Surgery): In the event that you are scheduled by another doctor or dentist to have any type of surgery or procedure, you are allowed (for a period no longer than 30 days), to receive additional pain medication, for the acute post-op pain. However, in this case, you are responsible for picking up a copy of our "Post-op Pain Management for Surgeons" handout, and giving it to your surgeon or dentist. This document is available at our office, and does not require an appointment to obtain it. Simply go to our office during business hours (Monday-Thursday from 8:00 AM to 4:00 PM) (Friday 8:00 AM to 12:00 Noon) or if you have a scheduled appointment with [place], prior to your surgery, and ask for it by name. In addition, you will need to provide [place] with your name, name of your surgeon, type of surgery, and date of procedure or surgery.  *Opioid medications include: morphine, codeine, oxycodone, oxymorphone, hydrocodone, hydromorphone, meperidine, tramadol, tapentadol, buprenorphine, fentanyl, methadone. **Benzodiazepine medications include: diazepam (Valium), alprazolam (Xanax), clonazepam (Klonopine), lorazepam (Ativan), clorazepate (Tranxene), chlordiazepoxide (Librium), estazolam (Prosom), oxazepam (Serax), temazepam (Restoril), triazolam (Halcion) (Last updated: 04/01/2017) ____________________________________________________________________________________________   BMI Assessment: Estimated body mass index is 29.05 kg/m as calculated from the following:   Height as of this encounter: 5\' 6"  (1.676 m).   Weight as of this encounter: 180 lb (81.6 kg).  BMI interpretation table: BMI level Category Range association with higher  incidence of chronic pain  <18 kg/m2 Underweight   18.5-24.9 kg/m2 Ideal body weight   25-29.9 kg/m2 Overweight Increased incidence by 20%  30-34.9 kg/m2 Obese (Class I) Increased incidence by 68%  35-39.9 kg/m2 Severe obesity (Class II) Increased incidence by 136%  >40 kg/m2 Extreme obesity (Class III) Increased incidence by 254%   Patient's current BMI Ideal Body weight  Body mass index is 29.05 kg/m. Ideal body weight: 59.3 kg (130 lb 11.7 oz) Adjusted ideal body weight: 68.2 kg (150 lb 7 oz)   BMI Readings from Last 4 Encounters:  12/01/17 29.05 kg/m  08/26/17 25.84 kg/m  05/27/17 27.41 kg/m  03/01/17 28.25 kg/m   Wt Readings from Last 4 Encounters:  12/01/17 180 lb (81.6 kg)  08/26/17 165 lb (74.8 kg)  05/27/17 175 lb (79.4 kg)  03/01/17 175 lb (79.4 kg)   Oxycodone 5 mg x 3 months to begin filling on 12/15/17 escribed to your pharmacy  Flexeril x 3 months also escribed to pharmacy

## 2017-12-05 DIAGNOSIS — J209 Acute bronchitis, unspecified: Secondary | ICD-10-CM | POA: Diagnosis not present

## 2017-12-22 DIAGNOSIS — Z1231 Encounter for screening mammogram for malignant neoplasm of breast: Secondary | ICD-10-CM | POA: Diagnosis not present

## 2018-03-03 ENCOUNTER — Encounter: Payer: Self-pay | Admitting: Nurse Practitioner

## 2018-03-03 ENCOUNTER — Ambulatory Visit: Payer: 59 | Attending: Nurse Practitioner | Admitting: Nurse Practitioner

## 2018-03-03 ENCOUNTER — Other Ambulatory Visit: Payer: Self-pay

## 2018-03-03 VITALS — BP 134/93 | HR 88 | Temp 98.1°F | Ht 67.0 in | Wt 170.0 lb

## 2018-03-03 DIAGNOSIS — M47812 Spondylosis without myelopathy or radiculopathy, cervical region: Secondary | ICD-10-CM | POA: Diagnosis not present

## 2018-03-03 DIAGNOSIS — Z789 Other specified health status: Secondary | ICD-10-CM | POA: Diagnosis not present

## 2018-03-03 DIAGNOSIS — M62838 Other muscle spasm: Secondary | ICD-10-CM | POA: Diagnosis not present

## 2018-03-03 DIAGNOSIS — M79643 Pain in unspecified hand: Secondary | ICD-10-CM | POA: Diagnosis not present

## 2018-03-03 DIAGNOSIS — Z79899 Other long term (current) drug therapy: Secondary | ICD-10-CM | POA: Diagnosis not present

## 2018-03-03 DIAGNOSIS — G894 Chronic pain syndrome: Secondary | ICD-10-CM | POA: Diagnosis not present

## 2018-03-03 DIAGNOSIS — G8929 Other chronic pain: Secondary | ICD-10-CM | POA: Insufficient documentation

## 2018-03-03 DIAGNOSIS — M47816 Spondylosis without myelopathy or radiculopathy, lumbar region: Secondary | ICD-10-CM

## 2018-03-03 DIAGNOSIS — M899 Disorder of bone, unspecified: Secondary | ICD-10-CM | POA: Insufficient documentation

## 2018-03-03 DIAGNOSIS — Z79891 Long term (current) use of opiate analgesic: Secondary | ICD-10-CM | POA: Diagnosis not present

## 2018-03-03 MED ORDER — CYCLOBENZAPRINE HCL 10 MG PO TABS: 10.0000 mg | ORAL_TABLET | Freq: Three times a day (TID) | ORAL | 0 refills | Status: DC

## 2018-03-03 MED ORDER — OXYCODONE HCL 5 MG PO TABS: 10.0000 mg | ORAL_TABLET | Freq: Four times a day (QID) | ORAL | 0 refills | Status: DC | PRN

## 2018-03-03 NOTE — Patient Instructions (Addendum)
____________________________________________________________________________________________  Medication Rules  Purpose: To inform patients, and their family members, of our rules and regulations.  Applies to: All patients receiving prescriptions (written or electronic).  Pharmacy of record: Pharmacy where electronic prescriptions will be sent. If written prescriptions are taken to a different pharmacy, please inform the nursing staff. The pharmacy listed in the electronic medical record should be the one where you would like electronic prescriptions to be sent.  Electronic prescriptions: In compliance with the [place] Strengthen Opioid Misuse Prevention (STOP) Act of 2017 (Session Law 2017-74/H243), effective February 02, 2018, all controlled substances must be electronically prescribed. Calling prescriptions to the pharmacy will cease to exist.  Prescription refills: Only during scheduled appointments. Applies to all prescriptions.  NOTE: The following applies primarily to controlled substances (Opioid* Pain Medications).   Patient's responsibilities: 1. Pain Pills: Bring all pain pills to every appointment (except for procedure appointments). 2. Pill Bottles: Bring pills in original pharmacy bottle. Always bring the newest bottle. Bring bottle, even if empty. 3. Medication refills: You are responsible for knowing and keeping track of what medications you take and those you need refilled. The day before your appointment: write a list of all prescriptions that need to be refilled. The day of the appointment: give the list to the admitting nurse. Prescriptions will be written only during appointments. If you forget a medication: it will not be "Called in", "Faxed", or "electronically sent". You will need to get another appointment to get these prescribed. No early refills. Do not call asking to have your prescription filled early. 4. Prescription Accuracy: You are responsible for  carefully inspecting your prescriptions before leaving our office. Have the discharge nurse carefully go over each prescription with you, before taking them home. Make sure that your name is accurately spelled, that your address is correct. Check the name and dose of your medication to make sure it is accurate. Check the number of pills, and the written instructions to make sure they are clear and accurate. Make sure that you are given enough medication to last until your next medication refill appointment. 5. Taking Medication: Take medication as prescribed. When it comes to controlled substances, taking less pills or less frequently than prescribed is permitted and encouraged. Never take more pills than instructed. Never take medication more frequently than prescribed.  6. Inform other Doctors: Always inform, all of your healthcare providers, of all the medications you take. 7. Pain Medication from other Providers: You are not allowed to accept any additional pain medication from any other Doctor or Healthcare provider. There are two exceptions to this rule. (see below) In the event that you require additional pain medication, you are responsible for notifying us, as stated below. 8. Medication Agreement: You are responsible for carefully reading and following our Medication Agreement. This must be signed before receiving any prescriptions from our practice. Safely store a copy of your signed Agreement. Violations to the Agreement will result in no further prescriptions. (Additional copies of our Medication Agreement are available upon request.) 9. Laws, Rules, & Regulations: All patients are expected to follow all Federal and State Laws, Statutes, Rules, & Regulations. Ignorance of the Laws does not constitute a valid excuse. The use of any illegal substances is prohibited. 10. Adopted CDC guidelines & recommendations: Target dosing levels will be at or below 60 MME/day. Use of benzodiazepines** is not  recommended.  Exceptions: There are only two exceptions to the rule of not receiving pain medications from other Healthcare Providers. 1.   Exception #1 (Emergencies): In the event of an emergency (i.e.: accident requiring emergency care), you are allowed to receive additional pain medication. However, you are responsible for: As soon as you are able, call our office [phone], at any time of the day or night, and leave a message stating your name, the date and nature of the emergency, and the name and dose of the medication prescribed. In the event that your call is answered by a member of our staff, make sure to document and save the date, time, and the name of the person that took your information.  2. Exception #2 (Planned Surgery): In the event that you are scheduled by another doctor or dentist to have any type of surgery or procedure, you are allowed (for a period no longer than 30 days), to receive additional pain medication, for the acute post-op pain. However, in this case, you are responsible for picking up a copy of our "Post-op Pain Management for Surgeons" handout, and giving it to your surgeon or dentist. This document is available at our office, and does not require an appointment to obtain it. Simply go to our office during business hours (Monday-Thursday from 8:00 AM to 4:00 PM) (Friday 8:00 AM to 12:00 Noon) or if you have a scheduled appointment with [place], prior to your surgery, and ask for it by name. In addition, you will need to provide [place] with your name, name of your surgeon, type of surgery, and date of procedure or surgery.  *Opioid medications include: morphine, codeine, oxycodone, oxymorphone, hydrocodone, hydromorphone, meperidine, tramadol, tapentadol, buprenorphine, fentanyl, methadone. **Benzodiazepine medications include: diazepam (Valium), alprazolam (Xanax), clonazepam (Klonopine), lorazepam (Ativan), clorazepate (Tranxene), chlordiazepoxide (Librium), estazolam (Prosom),  oxazepam (Serax), temazepam (Restoril), triazolam (Halcion) (Last updated: 04/01/2017) ____________________________________________________________________________________________  Holly Spears have been escribd oxycodone x 3 to last until 06-13-18.  You have been instructed to get labwork drawn today and provide a urine specimen.

## 2018-03-03 NOTE — Progress Notes (Signed)
Nursing Pain Medication Assessment:  Safety precautions to be maintained throughout the outpatient stay will include: orient to surroundings, keep bed in low position, maintain call bell within reach at all times, provide assistance with transfer out of bed and ambulation.  Medication Inspection Compliance: Pill count conducted under aseptic conditions, in front of the patient. Neither the pills nor the bottle was removed from the patient's sight at any time. Once count was completed pills were immediately returned to the patient in their original bottle.  Medication: Oxycodone IR Pill/Patch Count: 100 of 240 pills remain Pill/Patch Appearance: Markings consistent with prescribed medication Bottle Appearance: Standard pharmacy container. Clearly labeled. Filled Date: 1 / 5411 / 2020 Last Medication intake:  Today

## 2018-03-03 NOTE — Progress Notes (Signed)
Patient's Name: Holly Spears  MRN: [id number]  Referring Provider: Judene Companion, MD  DOB: 12-06-1970  PCP: Judene Companion, MD  DOS: 03/03/2018  Note by: Vevelyn Francois NP  Service setting: Ambulatory outpatient  Specialty: Interventional Pain Management  Location: [facility] (AMB) Pain Management Facility    Patient type: Established    Primary Reason(s) for Visit: Encounter for prescription drug management. (Level of risk: moderate)  CC: Back Pain  HPI  Ms. Checo is a 48 y.o. year old, female patient, who comes today for a medication management evaluation. She has Long term current use of opiate analgesic; Long term prescription opiate use; Opiate use (60 MME/Day); Encounter for therapeutic drug level monitoring; Opiate dependence ([place]); Encounter for chronic pain management; Muscle spasm; Myofascial pain; Acquired hypothyroidism; Intracervical pessary; Major depressive disorder, recurrent episode, moderate ([place]); Migraine with aura; Rheumatoid arthritis with positive rheumatoid factor ([place]); Musculoskeletal pain; Chronic pain syndrome; Chronic neck pain; Cervical spondylosis (C5-6 and C6-7); Cervical disc herniation (C6-7) (Left); Bulge of cervical disc without myelopathy (C5-6); Cervicogenic headache; Chronic cervical radicular pain (Bilateral) (R>L); Chronic pain of both shoulders (Bilateral) (R>L); Chronic low back pain (Location of Primary Source of Pain) (Bilateral) (R>L); Failed back surgical syndrome; 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; Lumbar spondylosis (L3-4, L4-5, and L5-S1); Lumbar bulging disc (L3-4, L4-5, and L5-S1); Chronic lumbar radicular pain (Bilateral) (R>L); Chronic knee pain (Location of Secondary source of pain) (Bilateral) (R>L); Chronic upper back pain (Left); Thoracic disc herniation (Large, Left paracentral T10-11 disc herniation); Thoracic spinal stenosis (Severe, Left, T10-11 Lateral Recess Stenosis); Thoracic foraminal stenosis (Severe Left T10-11); History of bilateral carpal  tunnel release (Bilateral); Chronic hand pain (Bilateral) (R>L); Esophageal reflux; History of psychiatric disorder; History of psychiatric care; Anxiety disorder; Depression; History of shingles; History of gastric bypass; History of Roux-en-Y gastric bypass; Hypokalemia; H/O eye disorder; Long term prescription benzodiazepine use; Candidiasis of mouth; Muscle weakness; Tick bite; Hypothyroidism; Pain in limb; Neck pain; Spasm of back muscles; Chronic back pain; Problems influencing health status; Pharmacologic therapy; Recurrent major depression ([place]); and Disorder of skeletal system on their problem list. Her primarily concern today is the Back Pain  Pain Assessment: Location: Mid Back Radiating: pain radiaties down right leg Onset: More than a month ago Duration: Chronic pain Quality: Aching Severity: 1 /10 (subjective, self-reported pain score)  Note: Reported level is compatible with observation.                         When using our objective Pain Scale, levels between 6 and 10/10 are said to belong in an emergency room, as it progressively worsens from a 6/10, described as severely limiting, requiring emergency care not usually available at an outpatient pain management facility. At a 6/10 level, communication becomes difficult and requires great effort. Assistance to reach the emergency department may be required. Facial flushing and profuse sweating along with potentially dangerous increases in heart rate and blood pressure will be evident. Effect on ADL: limits my daily activities Timing: Constant Modifying factors: medications, heat BP: (!) 134/93  HR: 88  Ms. Tetrault was last scheduled for an appointment on 12/01/2017 for medication management. During today's appointment we reviewed Ms. Rotenberg's chronic pain status, as well as her outpatient medication regimen.  The patient  reports no history of drug use. Her body mass index is 26.63 kg/m.  Further details on both, my  assessment(s), as well as the proposed treatment plan, please see below.  Controlled Substance Pharmacotherapy Assessment REMS (Risk  Evaluation and Mitigation Strategy)  Analgesic:Oxycodone IR 5 mg 2 tablets every 6 hours (40 mg/day of oxycodone per day) MME/day:60 mg/day  Chauncey Fischer, RN  03/03/2018 10:40 AM  Sign when Signing Visit Nursing Pain Medication Assessment:  Safety precautions to be maintained throughout the outpatient stay will include: orient to surroundings, keep bed in low position, maintain call bell within reach at all times, provide assistance with transfer out of bed and ambulation.  Medication Inspection Compliance: Pill count conducted under aseptic conditions, in front of the patient. Neither the pills nor the bottle was removed from the patient's sight at any time. Once count was completed pills were immediately returned to the patient in their original bottle.  Medication: Oxycodone IR Pill/Patch Count: 100 of 240 pills remain Pill/Patch Appearance: Markings consistent with prescribed medication Bottle Appearance: Standard pharmacy container. Clearly labeled. Filled Date: 1 / 76 / 2020 Last Medication intake:  Today   Pharmacokinetics: Liberation and absorption (onset of action): WNL Distribution (time to peak effect): WNL Metabolism and excretion (duration of action): WNL         Pharmacodynamics: Desired effects: Analgesia: Ms. Canal reports >50% benefit. Functional ability: Patient reports that medication allows her to accomplish basic ADLs Clinically meaningful improvement in function (CMIF): Sustained CMIF goals met Perceived effectiveness: Described as relatively effective, allowing for increase in activities of daily living (ADL) Undesirable effects: Side-effects or Adverse reactions: None reported Monitoring:  PMP: Online review of the past 74-monthperiod conducted. Compliant with practice rules and regulations Last UDS on record: Summary   Date Value Ref Range Status  05/27/2017 FINAL  Final    Comment:    ==================================================================== TOXASSURE SELECT 13 (MW) ==================================================================== Test                             Result       Flag       Units Drug Present and Declared for Prescription Verification   Oxycodone                      865          EXPECTED   ng/mg creat   Noroxycodone                   3115         EXPECTED   ng/mg creat    Sources of oxycodone include scheduled prescription medications.    Noroxycodone is an expected metabolite of oxycodone. Drug Absent but Declared for Prescription Verification   Diazepam                       Not Detected UNEXPECTED ng/mg creat ==================================================================== Test                      Result    Flag   Units      Ref Range   Creatinine              20               mg/dL      >=20 ==================================================================== Declared Medications:  The flagging and interpretation on this report are based on the  following declared medications.  Unexpected results may arise from  inaccuracies in the declared medications.  **Note: The testing scope of this panel includes these medications:  Diazepam (Valium)  Oxycodone  **Note: The testing  scope of this panel does not include following  reported medications:  Bupropion (Wellbutrin)  Buspirone  Calcium carbonate (Calcium Carb/Vitamin D)  Citalopram (Celexa)  Cyclobenzaprine (Flexeril)  Epinephrine (EpiPen)  Hydroxychloroquine (Plaquenil)  Levonorgestrel (Mirena)  Multivitamin  Potassium (Klor-Con)  Prednisone (Deltasone)  Ranitidine (Zantac)  Thyroid: Liothyronine/Levothyroxine  Vitamin B (Biotin)  Vitamin D (Calcium Carb/Vitamin D)  Vitamin D3 ==================================================================== For clinical consultation, please call [phone]. ====================================================================    UDS interpretation: Compliant          Medication Assessment Form: Reviewed. Patient indicates being compliant with therapy Treatment compliance: Compliant Risk Assessment Profile: Aberrant behavior: See prior evaluations. None observed or detected today Comorbid factors increasing risk of overdose: See prior notes. No additional risks detected today Opioid risk tool (ORT) (Total Score): 0 Personal History of Substance Abuse (SUD-Substance use disorder):  Alcohol: Negative  Illegal Drugs: Negative  Rx Drugs: Negative  ORT Risk Level calculation: Low Risk Risk of substance use disorder (SUD): Low Opioid Risk Tool - 03/03/18 1047      Family History of Substance Abuse   Alcohol  Negative    Illegal Drugs  Negative    Rx Drugs  Negative      Personal History of Substance Abuse   Alcohol  Negative    Illegal Drugs  Negative    Rx Drugs  Negative      Age   Age between 65-45 years   No      History of Preadolescent Sexual Abuse   History of Preadolescent Sexual Abuse  Negative or Female      Psychological Disease   Psychological Disease  Negative    Depression  Negative      Total Score   Opioid Risk Tool Scoring  0    Opioid Risk Interpretation  Low Risk      ORT Scoring interpretation table:  Score <3 = Low Risk for SUD  Score between 4-7 = Moderate Risk for SUD  Score >8 = High Risk for Opioid Abuse   Risk Mitigation Strategies:  Patient Counseling: Covered Patient-Prescriber Agreement (PPA): Present and active  Notification to other healthcare providers: Done  Pharmacologic Plan: No change in therapy, at this time.             Laboratory Chemistry  Inflammation Markers (CRP: Acute Phase) (ESR: Chronic Phase) Lab Results  Component Value Date   CRP <0.5 12/31/2014   ESRSEDRATE 5 12/31/2014                         Rheumatology Markers No results found for: RF, ANA, LABURIC,  URICUR, LYMEIGGIGMAB, LYMEABIGMQN, HLAB27                      Renal Function Markers Lab Results  Component Value Date   BUN 7 03/27/2015   CREATININE 0.58 03/27/2015   GFRAA >60 03/27/2015   GFRNONAA >60 03/27/2015                             Hepatic Function Markers Lab Results  Component Value Date   AST 26 03/27/2015   ALT 26 03/27/2015   ALBUMIN 4.3 03/27/2015   ALKPHOS 78 03/27/2015                        Electrolytes Lab Results  Component Value Date   NA 139 03/27/2015  K 3.1 (L) 03/27/2015   CL 104 03/27/2015   CALCIUM 8.7 (L) 03/27/2015   MG 2.0 12/31/2014                        Neuropathy Markers No results found for: VITAMINB12, FOLATE, HGBA1C, HIV                      CNS Tests No results found for: COLORCSF, APPEARCSF, RBCCOUNTCSF, WBCCSF, POLYSCSF, LYMPHSCSF, EOSCSF, PROTEINCSF, GLUCCSF, JCVIRUS, CSFOLI, IGGCSF                      Bone Pathology Markers Lab Results  Component Value Date   VD25OH 29.3 (L) 12/31/2014                         Coagulation Parameters No results found for: INR, LABPROT, APTT, PLT, DDIMER, LABHEMA, VITAMINK1                      Cardiovascular Markers No results found for: BNP, CKTOTAL, CKMB, TROPONINI, HGB, HCT                       CA Markers No results found for: CEA, CA125, LABCA2                      Note: Lab results reviewed.  Recent Diagnostic Imaging Results  DG Lumbar Spine Complete W/Bend CLINICAL DATA:  Chronic pain, no injury Please comment in the report about spinal instability (>4 mm displacement), in addition to any: Spondylolisthesis (specify retro- or antero- and Grade level) (describe displacement in millimeters )  EXAM: LUMBAR SPINE - COMPLETE WITH BENDING VIEWS  COMPARISON:  None.  FINDINGS: There is mild degenerative change in the facet joints of the lower lumbar spine. No acute fracture or subluxation. Surgical clips are noted in the right upper quadrant the abdomen. Intrauterine  device overlies the central pelvis.  IMPRESSION: Mild lower lumbar degenerative changes.  No spondylolisthesis.  Electronically Signed   By: Nolon Nations M.D.   On: 02/18/2017 15:47 DG Thoracic Spine 2 View CLINICAL DATA:  Chronic pain, no injury Please comment in the report about spinal instability (>4 mm displacement), in addition to any: Spondylolisthesis (specify retro- or antero- and Grade level) (describe displacement in millimeters);  EXAM: THORACIC SPINE 2 VIEWS  COMPARISON:  None.  FINDINGS: There is mild S shaped scoliosis of the thoracic spine not associated with vertebral body anomalies. Mild degenerative changes are seen in the thoracic spine. No spondylolisthesis. Mild anterior wedge morphology noted at T11 is of indeterminate age. No acute fracture.  IMPRESSION: Mild midthoracic spondylosis.  No spondylolisthesis.  Mild anterior wedging of T11, age indeterminate.  Electronically Signed   By: Nolon Nations M.D.   On: 02/18/2017 15:44  Complexity Note: Imaging results reviewed. Results shared with Ms. Kutter, using Layman's terms.                         Meds   Current Outpatient Medications:  .  albuterol (VENTOLIN HFA) 108 (90 Base) MCG/ACT inhaler, Take 1 puff by mouth as needed., Disp: , Rfl:  .  Biotin 10 MG CAPS, Take 1 tablet by mouth daily. , Disp: , Rfl:  .  buPROPion (WELLBUTRIN) 100 MG tablet, Take 100 mg by mouth daily., Disp: , Rfl:  .  busPIRone (BUSPAR) 5 MG tablet, Take 5 mg by mouth daily., Disp: , Rfl:  .  Calcium Carbonate-Vitamin D 600-125 MG-UNIT TABS, Take 1 tablet by mouth daily., Disp: , Rfl:  .  Cholecalciferol (VITAMIN D3) 1000 units CAPS, Take 1 capsule by mouth daily. , Disp: , Rfl:  .  citalopram (CELEXA) 20 MG tablet, Take 20 mg by mouth daily., Disp: , Rfl:  .  [START ON 03/15/2018] cyclobenzaprine (FLEXERIL) 10 MG tablet, Take 1 tablet (10 mg total) by mouth 3 (three) times daily., Disp: 270 tablet, Rfl: 0 .   diazepam (VALIUM) 5 MG tablet, Take 5 mg by mouth as needed. , Disp: , Rfl: 1 .  EPINEPHrine 0.3 mg/0.3 mL IJ SOAJ injection, Frequency:PRN   Dosage:0.0     Instructions:  Note:Dose: 0.3MG/0.3, Disp: , Rfl:  .  hydroxychloroquine (PLAQUENIL) 200 MG tablet, Take 200 mg by mouth 2 (two) times daily., Disp: , Rfl:  .  ibuprofen (ADVIL,MOTRIN) 800 MG tablet, Take 800 mg by mouth as needed., Disp: , Rfl:  .  levonorgestrel (MIRENA) 20 MCG/24HR IUD, by Intrauterine route., Disp: , Rfl:  .  Multiple Vitamin (MULTI-VITAMINS) TABS, Take 1 tablet by mouth 3 (three) times daily. , Disp: , Rfl:  .  NATURE-THROID 48.75 MG TABS, Take 48.75 mg by mouth daily., Disp: , Rfl:  .  [START ON 05/14/2018] oxyCODONE (OXY IR/ROXICODONE) 5 MG immediate release tablet, Take 2 tablets (10 mg total) by mouth every 6 (six) hours as needed for up to 30 days for severe pain., Disp: 240 tablet, Rfl: 0 .  potassium chloride SA (K-DUR,KLOR-CON) 20 MEQ tablet, Take 20 mEq by mouth 4 (four) times daily. , Disp: , Rfl:  .  predniSONE (DELTASONE) 5 MG tablet, Take 2.5 mg by mouth daily with breakfast. , Disp: , Rfl:  .  ranitidine (ZANTAC) 150 MG tablet, Take 150 mg by mouth 2 (two) times daily. , Disp: , Rfl:  .  buPROPion (WELLBUTRIN) 100 MG tablet, Take 200 mg 2 (two) times daily by mouth. , Disp: , Rfl:  .  [START ON 04/14/2018] oxyCODONE (OXY IR/ROXICODONE) 5 MG immediate release tablet, Take 2 tablets (10 mg total) by mouth every 6 (six) hours as needed for up to 30 days for severe pain., Disp: 240 tablet, Rfl: 0 .  [START ON 03/15/2018] oxyCODONE (OXY IR/ROXICODONE) 5 MG immediate release tablet, Take 2 tablets (10 mg total) by mouth every 6 (six) hours as needed for up to 30 days for severe pain., Disp: 240 tablet, Rfl: 0  ROS  Constitutional: Denies any fever or chills Gastrointestinal: No reported hemesis, hematochezia, vomiting, or acute GI distress Musculoskeletal: Denies any acute onset joint swelling, redness, loss of ROM,  or weakness Neurological: No reported episodes of acute onset apraxia, aphasia, dysarthria, agnosia, amnesia, paralysis, loss of coordination, or loss of consciousness  Allergies  Ms. Deignan is allergic to bee venom; honey bee venom; penicillins; shellfish allergy; shellfish-derived products; sulfa antibiotics; erythromycin; sulfamethizole; zaleplon; codeine; erythromycin base; and trazodone.  PFSH  Drug: Ms. Sliker  reports no history of drug use. Alcohol:  reports no history of alcohol use. Tobacco:  reports that she has quit smoking. She has never used smokeless tobacco. Medical:  has a past medical history of Acid reflux (12/15/2011), Anxiety, generalized (12/15/2011), Arthritis, Cervical pain (12/15/2011), Depression, GERD (gastroesophageal reflux disease), Hiatal hernia, LBP (low back pain) (12/15/2011), Migraine, Muscle ache (12/15/2011), Pain (12/27/2012), Rheumatoid arthritis ([place]) (12/15/2011), and Scratched cornea (10/25/2015). Surgical: Ms. Heney  has a  past surgical history that includes Cholecystectomy. Family: family history includes Depression in her mother; Diabetes in her father; Hyperlipidemia in her mother; Hypertension in her father and mother.  Constitutional Exam  General appearance: Well nourished, well developed, and well hydrated. In no apparent acute distress Vitals:   03/03/18 1040  BP: (!) 134/93  Pulse: 88  Temp: 98.1 F (36.7 C)  SpO2: 100%  Weight: 170 lb (77.1 kg)  Height: _0  (1.702 m)   BMI Assessment: Estimated body mass index is 26.63 kg/m as calculated from the following:   Height as of this encounter: _1  (1.702 m).   Weight as of this encounter: 170 lb (77.1 kg).  BMI interpretation table: BMI level Category Range association with higher incidence of chronic pain  <18 kg/m2 Underweight   18.5-24.9 kg/m2 Ideal body weight   25-29.9 kg/m2 Overweight Increased incidence by 20%  30-34.9 kg/m2 Obese (Class I) Increased incidence by 68%   35-39.9 kg/m2 Severe obesity (Class II) Increased incidence by 136%  >40 kg/m2 Extreme obesity (Class III) Increased incidence by 254%   Patient's current BMI Ideal Body weight  Body mass index is 26.63 kg/m. Ideal body weight: 61.6 kg (135 lb 12.9 oz) Adjusted ideal body weight: 67.8 kg (149 lb 7.7 oz)   BMI Readings from Last 4 Encounters:  03/03/18 26.63 kg/m  12/01/17 29.05 kg/m  08/26/17 25.84 kg/m  05/27/17 27.41 kg/m   Wt Readings from Last 4 Encounters:  03/03/18 170 lb (77.1 kg)  12/01/17 180 lb (81.6 kg)  08/26/17 165 lb (74.8 kg)  05/27/17 175 lb (79.4 kg)  Psych/Mental status: Alert, oriented x 3 (person, place, & time)       Eyes: PERLA Respiratory: No evidence of acute respiratory distress  Cervical Spine Area Exam  Skin & Axial Inspection: No masses, redness, edema, swelling, or associated skin lesions Alignment: Symmetrical Functional ROM: Unrestricted ROM      Stability: No instability detected Muscle Tone/Strength: Functionally intact. No obvious neuro-muscular anomalies detected. Sensory (Neurological): Unimpaired Palpation: No palpable anomalies              Upper Extremity (UE) Exam    Side: Right upper extremity  Side: Left upper extremity  Skin & Extremity Inspection: Skin color, temperature, and hair growth are WNL. No peripheral edema or cyanosis. No masses, redness, swelling, asymmetry, or associated skin lesions. No contractures.  Skin & Extremity Inspection: Skin color, temperature, and hair growth are WNL. No peripheral edema or cyanosis. No masses, redness, swelling, asymmetry, or associated skin lesions. No contractures.  Functional ROM: Unrestricted ROM          Functional ROM: Unrestricted ROM          Muscle Tone/Strength: Functionally intact. No obvious neuro-muscular anomalies detected.  Muscle Tone/Strength: Functionally intact. No obvious neuro-muscular anomalies detected.  Sensory (Neurological): Unimpaired          Sensory  (Neurological): Unimpaired          Palpation: No palpable anomalies              Palpation: No palpable anomalies                   Thoracic Spine Area Exam  Skin & Axial Inspection: No masses, redness, or swelling Alignment: Symmetrical Functional ROM: Unrestricted ROM Stability: No instability detected Muscle Tone/Strength: Functionally intact. No obvious neuro-muscular anomalies detected. Sensory (Neurological): Unimpaired Muscle strength & Tone: No palpable anomalies  Lumbar Spine Area Exam  Skin &  Axial Inspection: No masses, redness, or swelling Alignment: Symmetrical Functional ROM: Unrestricted ROM       Stability: No instability detected Muscle Tone/Strength: Functionally intact. No obvious neuro-muscular anomalies detected. Sensory (Neurological): Unimpaired Palpation: No palpable anomalies        Gait & Posture Assessment  Ambulation: Unassisted Gait: Relatively normal for age and body habitus Posture: WNL   Lower Extremity Exam    Side: Right lower extremity  Side: Left lower extremity  Stability: No instability observed          Stability: No instability observed          Skin & Extremity Inspection: Skin color, temperature, and hair growth are WNL. No peripheral edema or cyanosis. No masses, redness, swelling, asymmetry, or associated skin lesions. No contractures.  Skin & Extremity Inspection: Skin color, temperature, and hair growth are WNL. No peripheral edema or cyanosis. No masses, redness, swelling, asymmetry, or associated skin lesions. No contractures.  Functional ROM: Unrestricted ROM                  Functional ROM: Unrestricted ROM                  Muscle Tone/Strength: Functionally intact. No obvious neuro-muscular anomalies detected.  Muscle Tone/Strength: Functionally intact. No obvious neuro-muscular anomalies detected.  Sensory (Neurological): Unimpaired        Sensory (Neurological): Unimpaired            Palpation: No palpable anomalies   Palpation: No palpable anomalies   Assessment  Primary Diagnosis & Pertinent Problem List: The primary encounter diagnosis was Cervical spondylosis (C5-6 and C6-7). Diagnoses of Lumbar spondylosis (L3-4, L4-5, and L5-S1), Chronic hand pain, unspecified laterality, Muscle spasm, Chronic pain syndrome, Long term current use of opiate analgesic, Pharmacologic therapy, Disorder of skeletal system, and Problems influencing health status were also pertinent to this visit.  Status Diagnosis  Controlled Controlled Controlled 1. Cervical spondylosis (C5-6 and C6-7)   2. Lumbar spondylosis (L3-4, L4-5, and L5-S1)   3. Chronic hand pain, unspecified laterality   4. Muscle spasm   5. Chronic pain syndrome   6. Long term current use of opiate analgesic   7. Pharmacologic therapy   8. Disorder of skeletal system   9. Problems influencing health status     Problems updated and reviewed during this visit: Problem  Disorder of Skeletal System   Plan of Care  Pharmacotherapy (Medications Ordered): Meds ordered this encounter  Medications  . cyclobenzaprine (FLEXERIL) 10 MG tablet    Sig: Take 1 tablet (10 mg total) by mouth 3 (three) times daily.    Dispense:  270 tablet    Refill:  0    Do not place this medication, or any other prescription from our practice, on "Automatic Refill". Patient may have prescription filled one day early if pharmacy is closed on scheduled refill date.    Order Specific Question:   Supervising Provider    Answer:   Milinda Pointer [phone]  . oxyCODONE (OXY IR/ROXICODONE) 5 MG immediate release tablet    Sig: Take 2 tablets (10 mg total) by mouth every 6 (six) hours as needed for up to 30 days for severe pain.    Dispense:  240 tablet    Refill:  0    Do not place this medication, or any other prescription from our practice, on "Automatic Refill". Patient may have prescription filled one day early if pharmacy is closed on scheduled refill date.  Order Specific  Question:   Supervising Provider    Answer:   Milinda Pointer [phone]  . oxyCODONE (OXY IR/ROXICODONE) 5 MG immediate release tablet    Sig: Take 2 tablets (10 mg total) by mouth every 6 (six) hours as needed for up to 30 days for severe pain.    Dispense:  240 tablet    Refill:  0    Do not place this medication, or any other prescription from our practice, on "Automatic Refill". Patient may have prescription filled one day early if pharmacy is closed on scheduled refill date.    Order Specific Question:   Supervising Provider    Answer:   Milinda Pointer [phone]  . oxyCODONE (OXY IR/ROXICODONE) 5 MG immediate release tablet    Sig: Take 2 tablets (10 mg total) by mouth every 6 (six) hours as needed for up to 30 days for severe pain.    Dispense:  240 tablet    Refill:  0    Do not place this medication, or any other prescription from our practice, on "Automatic Refill". Patient may have prescription filled one day early if pharmacy is closed on scheduled refill date.    Order Specific Question:   Supervising Provider    Answer:   Milinda Pointer [[id number]]   New Prescriptions   No medications on file   Medications administered today: Merary Wichmann had no medications administered during this visit. Lab-work, procedure(s), and/or referral(s): Orders Placed This Encounter  Procedures  . ToxASSURE Select 13 (MW), Urine  . Comp. Metabolic Panel (12)  . Magnesium  . Vitamin B12  . Sedimentation rate  . C-reactive protein   Imaging and/or referral(s): None  Interventional therapies: Planned, scheduled, and/or pending: Not at this time.   Considering: Diagnostic bilateral lumbar facet block  Possible bilateral lumbar facet radiofrequency ablation  Diagnostic right-sided L5-S1 lumbar epidural steroid injection  Possible right-sided L5-S1 versus S1 transforaminal epidural steroid injection  Diagnostic bilateral intra-articular knee injections with local anesthetic  and steroid  Possible series of 5 intra-articular Hyalgan knee injections  Diagnostic bilateral Genicular nerve block  Possible bilateral Genicular nerve radiofrequency ablation   Palliative PRN treatment(s): Diagnostic bilateral lumbar facet block  Diagnostic right-sided L5-S1 lumbar epidural steroid injection  Diagnostic bilateral intra-articular knee injections with local anesthetic and steroid  Diagnostic bilateral Genicular nerve block    Provider-requested follow-up: Return in about 3 months (around 06/02/2018) for MedMgmt.  Future Appointments  Date Time Provider [place]  06/02/2018 11:30 AM Vevelyn Francois, NP [facility] None   Primary Care Physician: Judene Companion, MD Location: [facility] Note by: Vevelyn Francois NP Date: 03/03/2018; Time: 12:55 PM  Pain Score Disclaimer: We use the NRS-11 scale. This is a self-reported, subjective measurement of pain severity with only modest accuracy. It is used primarily to identify changes within a particular patient. It must be understood that outpatient pain scales are significantly less accurate that those used for research, where they can be applied under ideal controlled circumstances with minimal exposure to variables. In reality, the score is likely to be a combination of pain intensity and pain affect, where pain affect describes the degree of emotional arousal or changes in action readiness caused by the sensory experience of pain. Factors such as social and work situation, setting, emotional state, anxiety levels, expectation, and prior pain experience may influence pain perception and show large inter-individual differences that may also be affected by time variables.  Patient instructions provided during  this appointment: Patient Instructions  ____________________________________________________________________________________________  Medication Rules  Purpose: To inform patients,  and their family members, of our rules and regulations.  Applies to: All patients receiving prescriptions (written or electronic).  Pharmacy of record: Pharmacy where electronic prescriptions will be sent. If written prescriptions are taken to a different pharmacy, please inform the nursing staff. The pharmacy listed in the electronic medical record should be the one where you would like electronic prescriptions to be sent.  Electronic prescriptions: In compliance with the [place] (STOP) Act of 2017 (Session Lanny Cramp [phone]), effective February 02, 2018, all controlled substances must be electronically prescribed. Calling prescriptions to the pharmacy will cease to exist.  Prescription refills: Only during scheduled appointments. Applies to all prescriptions.  NOTE: The following applies primarily to controlled substances (Opioid* Pain Medications).   Patient's responsibilities: 1. Pain Pills: Bring all pain pills to every appointment (except for procedure appointments). 2. Pill Bottles: Bring pills in original pharmacy bottle. Always bring the newest bottle. Bring bottle, even if empty. 3. Medication refills: You are responsible for knowing and keeping track of what medications you take and those you need refilled. The day before your appointment: write a list of all prescriptions that need to be refilled. The day of the appointment: give the list to the admitting nurse. Prescriptions will be written only during appointments. If you forget a medication: it will not be "Called in", "Faxed", or "electronically sent". You will need to get another appointment to get these prescribed. No early refills. Do not call asking to have your prescription filled early. 4. Prescription Accuracy: You are responsible for carefully inspecting your prescriptions before leaving our office. Have the discharge nurse carefully go over each prescription with you, before taking  them home. Make sure that your name is accurately spelled, that your address is correct. Check the name and dose of your medication to make sure it is accurate. Check the number of pills, and the written instructions to make sure they are clear and accurate. Make sure that you are given enough medication to last until your next medication refill appointment. 5. Taking Medication: Take medication as prescribed. When it comes to controlled substances, taking less pills or less frequently than prescribed is permitted and encouraged. Never take more pills than instructed. Never take medication more frequently than prescribed.  6. Inform other Doctors: Always inform, all of your healthcare providers, of all the medications you take. 7. Pain Medication from other Providers: You are not allowed to accept any additional pain medication from any other Doctor or Healthcare provider. There are two exceptions to this rule. (see below) In the event that you require additional pain medication, you are responsible for notifying us, as stated below. 8. Medication Agreement: You are responsible for carefully reading and following our Medication Agreement. This must be signed before receiving any prescriptions from our practice. Safely store a copy of your signed Agreement. Violations to the Agreement will result in no further prescriptions. (Additional copies of our Medication Agreement are available upon request.) 9. Laws, Rules, & Regulations: All patients are expected to follow all Federal and [employer], Rules, [employer]. Ignorance of the Laws does not constitute a valid excuse. The use of any illegal substances is prohibited. 10. Adopted CDC guidelines & recommendations: Target dosing levels will be at or below 60 MME/day. Use of benzodiazepines** is not recommended.  Exceptions: There are only two exceptions to the rule of not receiving pain medications  from other Healthcare Providers. 1. Exception #1  (Emergencies): In the event of an emergency (i.e.: accident requiring emergency care), you are allowed to receive additional pain medication. However, you are responsible for: As soon as you are able, call our office [phone], at any time of the day or night, and leave a message stating your name, the date and nature of the emergency, and the name and dose of the medication prescribed. In the event that your call is answered by a member of our staff, make sure to document and save the date, time, and the name of the person that took your information.  2. Exception #2 (Planned Surgery): In the event that you are scheduled by another doctor or dentist to have any type of surgery or procedure, you are allowed (for a period no longer than 30 days), to receive additional pain medication, for the acute post-op pain. However, in this case, you are responsible for picking up a copy of our "Post-op Pain Management for Surgeons" handout, and giving it to your surgeon or dentist. This document is available at our office, and does not require an appointment to obtain it. Simply go to our office during business hours (Monday-Thursday from 8:00 AM to 4:00 PM) (Friday 8:00 AM to 12:00 Noon) or if you have a scheduled appointment with [place], prior to your surgery, and ask for it by name. In addition, you will need to provide [place] with your name, name of your surgeon, type of surgery, and date of procedure or surgery.  *Opioid medications include: morphine, codeine, oxycodone, oxymorphone, hydrocodone, hydromorphone, meperidine, tramadol, tapentadol, buprenorphine, fentanyl, methadone. **Benzodiazepine medications include: diazepam (Valium), alprazolam (Xanax), clonazepam (Klonopine), lorazepam (Ativan), clorazepate (Tranxene), chlordiazepoxide (Librium), estazolam (Prosom), oxazepam (Serax), temazepam (Restoril), triazolam (Halcion) (Last updated:  04/01/2017) ____________________________________________________________________________________________  Dennis Bast have been escribd oxycodone x 3 to last until 06-13-18.  You have been instructed to get labwork drawn today and provide a urine specimen.

## 2018-03-04 LAB — COMP. METABOLIC PANEL (12)
A/G RATIO: 2.7 — AB (ref 1.2–2.2)
ALBUMIN: 4.6 g/dL (ref 3.8–4.8)
ALK PHOS: 71 IU/L (ref 39–117)
AST: 21 IU/L (ref 0–40)
BILIRUBIN TOTAL: 0.4 mg/dL (ref 0.0–1.2)
BUN/Creatinine Ratio: 10 (ref 9–23)
BUN: 6 mg/dL (ref 6–24)
Calcium: 8.9 mg/dL (ref 8.7–10.2)
Chloride: 97 mmol/L (ref 96–106)
Creatinine, Ser: 0.63 mg/dL (ref 0.57–1.00)
GFR calc Af Amer: 123 mL/min/{1.73_m2} (ref >59)
GFR calc non Af Amer: 106 mL/min/{1.73_m2} (ref >59)
GLOBULIN, TOTAL: 1.7 g/dL (ref 1.5–4.5)
GLUCOSE: 99 mg/dL (ref 65–99)
POTASSIUM: 3.9 mmol/L (ref 3.5–5.2)
SODIUM: 136 mmol/L (ref 134–144)
TOTAL PROTEIN: 6.3 g/dL (ref 6.0–8.5)

## 2018-03-04 LAB — VITAMIN B12: Vitamin B-12: 386 pg/mL (ref 232–1245)

## 2018-03-04 LAB — MAGNESIUM: MAGNESIUM: 2.1 mg/dL (ref 1.6–2.3)

## 2018-03-04 LAB — SEDIMENTATION RATE: Sed Rate: 2 mm/hr (ref 0–32)

## 2018-03-04 LAB — C-REACTIVE PROTEIN: CRP: <1 mg/L (ref 0–10)

## 2018-03-08 LAB — TOXASSURE SELECT 13 (MW), URINE

## 2018-03-11 DIAGNOSIS — M79642 Pain in left hand: Secondary | ICD-10-CM | POA: Diagnosis not present

## 2018-03-11 DIAGNOSIS — M79641 Pain in right hand: Secondary | ICD-10-CM | POA: Diagnosis not present

## 2018-03-11 DIAGNOSIS — M0609 Rheumatoid arthritis without rheumatoid factor, multiple sites: Secondary | ICD-10-CM | POA: Diagnosis not present

## 2018-03-11 DIAGNOSIS — M5414 Radiculopathy, thoracic region: Secondary | ICD-10-CM | POA: Diagnosis not present

## 2018-04-05 DIAGNOSIS — S81801A Unspecified open wound, right lower leg, initial encounter: Secondary | ICD-10-CM | POA: Diagnosis not present

## 2018-06-02 ENCOUNTER — Ambulatory Visit: Payer: 59 | Attending: Nurse Practitioner | Admitting: Nurse Practitioner

## 2018-06-02 ENCOUNTER — Encounter: Payer: Self-pay | Admitting: Nurse Practitioner

## 2018-06-02 ENCOUNTER — Other Ambulatory Visit: Payer: Self-pay

## 2018-06-02 DIAGNOSIS — M47816 Spondylosis without myelopathy or radiculopathy, lumbar region: Secondary | ICD-10-CM | POA: Diagnosis not present

## 2018-06-02 DIAGNOSIS — G894 Chronic pain syndrome: Secondary | ICD-10-CM | POA: Diagnosis not present

## 2018-06-02 DIAGNOSIS — M7918 Myalgia, other site: Secondary | ICD-10-CM

## 2018-06-02 DIAGNOSIS — M47812 Spondylosis without myelopathy or radiculopathy, cervical region: Secondary | ICD-10-CM | POA: Diagnosis not present

## 2018-06-02 DIAGNOSIS — M62838 Other muscle spasm: Secondary | ICD-10-CM | POA: Diagnosis not present

## 2018-06-02 MED ORDER — OXYCODONE HCL 5 MG PO TABS: 10.0000 mg | ORAL_TABLET | Freq: Four times a day (QID) | ORAL | 0 refills | Status: DC | PRN

## 2018-06-02 MED ORDER — CYCLOBENZAPRINE HCL 10 MG PO TABS: 10.0000 mg | ORAL_TABLET | Freq: Three times a day (TID) | ORAL | 0 refills | Status: DC

## 2018-06-02 NOTE — Patient Instructions (Signed)
____________________________________________________________________________________________  Medication Rules  Purpose: To inform patients, and their family members, of our rules and regulations.  Applies to: All patients receiving prescriptions (written or electronic).  Pharmacy of record: Pharmacy where electronic prescriptions will be sent. If written prescriptions are taken to a different pharmacy, please inform the nursing staff. The pharmacy listed in the electronic medical record should be the one where you would like electronic prescriptions to be sent.  Electronic prescriptions: In compliance with the [place] [employer] Opioid Misuse Prevention (STOP) Act of 2017 (Session Law 2017-74/H243), effective February 02, 2018, all controlled substances must be electronically prescribed. Calling prescriptions to the pharmacy will cease to exist.  Prescription refills: Only during scheduled appointments. Applies to all prescriptions.  NOTE: The following applies primarily to controlled substances (Opioid* Pain Medications).   Patient's responsibilities: 1. Pain Pills: Bring all pain pills to every appointment (except for procedure appointments). 2. Pill Bottles: Bring pills in original pharmacy bottle. Always bring the newest bottle. Bring bottle, even if empty. 3. Medication refills: You are responsible for knowing and keeping track of what medications you take and those you need refilled. The day before your appointment: write a list of all prescriptions that need to be refilled. The day of the appointment: give the list to the admitting nurse. Prescriptions will be written only during appointments. No prescriptions will be written on procedure days. If you forget a medication: it will not be "Called in", "Faxed", or "electronically sent". You will need to get another appointment to get these prescribed. No early refills. Do not call asking to have your prescription filled  early. 4. Prescription Accuracy: You are responsible for carefully inspecting your prescriptions before leaving our office. Have the discharge nurse carefully go over each prescription with you, before taking them home. Make sure that your name is accurately spelled, that your address is correct. Check the name and dose of your medication to make sure it is accurate. Check the number of pills, and the written instructions to make sure they are clear and accurate. Make sure that you are given enough medication to last until your next medication refill appointment. 5. Taking Medication: Take medication as prescribed. When it comes to controlled substances, taking less pills or less frequently than prescribed is permitted and encouraged. Never take more pills than instructed. Never take medication more frequently than prescribed.  6. Inform other Doctors: Always inform, all of your healthcare providers, of all the medications you take. 7. Pain Medication from other Providers: You are not allowed to accept any additional pain medication from any other Doctor or Healthcare provider. There are two exceptions to this rule. (see below) In the event that you require additional pain medication, you are responsible for notifying us, as stated below. 8. Medication Agreement: You are responsible for carefully reading and following our Medication Agreement. This must be signed before receiving any prescriptions from our practice. Safely store a copy of your signed Agreement. Violations to the Agreement will result in no further prescriptions. (Additional copies of our Medication Agreement are available upon request.) 9. Laws, Rules, & Regulations: All patients are expected to follow all Federal and State Laws, Statutes, Rules, & Regulations. Ignorance of the Laws does not constitute a valid excuse. The use of any illegal substances is prohibited. 10. Adopted CDC guidelines & recommendations: Target dosing levels will be  at or below 60 MME/day. Use of benzodiazepines** is not recommended.  Exceptions: There are only two exceptions to the rule of not   receiving pain medications from other Healthcare Providers. 1. Exception #1 (Emergencies): In the event of an emergency (i.e.: accident requiring emergency care), you are allowed to receive additional pain medication. However, you are responsible for: As soon as you are able, call our office [phone], at any time of the day or night, and leave a message stating your name, the date and nature of the emergency, and the name and dose of the medication prescribed. In the event that your call is answered by a member of our staff, make sure to document and save the date, time, and the name of the person that took your information.  2. Exception #2 (Planned Surgery): In the event that you are scheduled by another doctor or dentist to have any type of surgery or procedure, you are allowed (for a period no longer than 30 days), to receive additional pain medication, for the acute post-op pain. However, in this case, you are responsible for picking up a copy of our "Post-op Pain Management for Surgeons" handout, and giving it to your surgeon or dentist. This document is available at our office, and does not require an appointment to obtain it. Simply go to our office during business hours (Monday-Thursday from 8:00 AM to 4:00 PM) (Friday 8:00 AM to 12:00 Noon) or if you have a scheduled appointment with us, prior to your surgery, and ask for it by name. In addition, you will need to provide us with your name, name of your surgeon, type of surgery, and date of procedure or surgery.  *Opioid medications include: morphine, codeine, oxycodone, oxymorphone, hydrocodone, hydromorphone, meperidine, tramadol, tapentadol, buprenorphine, fentanyl, methadone. **Benzodiazepine medications include: diazepam (Valium), alprazolam (Xanax), clonazepam (Klonopine), lorazepam (Ativan), clorazepate  (Tranxene), chlordiazepoxide (Librium), estazolam (Prosom), oxazepam (Serax), temazepam (Restoril), triazolam (Halcion) (Last updated: 04/01/2017) ____________________________________________________________________________________________    

## 2018-06-02 NOTE — Progress Notes (Signed)
Pain Management Encounter Note - Virtual Visit via Telephone Telehealth (real-time audio visits between healthcare provider and patient).  Patient's Phone No. & Preferred Pharmacy:  [phone] (home); [phone] (mobile); (Preferred) [phone]  [facility] [place] [address] [place] [address] Phone: [phone] Fax: [phone]   Pre-screening note:  Our staff contacted [name] and offered her an "in person", "face-to-face" appointment versus a telephone encounter. She indicated preferring the telephone encounter, at this time.  Reason for Virtual Visit: COVID-19*  Social distancing based on CDC and AMA recommendations.   I contacted Holly KendallCarla Spears on 06/02/2018 at 10:03 AM by telephone and clearly identified myself as Thad Rangerrystal , NP. I verified that I was speaking with the correct person using two identifiers (Name and date of birth: 02/27/1970).  Advanced Informed Consent I sought verbal advanced consent from Holly Kendallarla Tunison for telemedicine interactions and virtual visit. I informed Ms. Steven of the security and privacy concerns, risks, and limitations associated with performing an evaluation and management service by telephone. I also informed Ms. Wimmer of the availability of "in person" appointments and I informed her of the possibility of a patient responsible charge related to this service. [name] expressed understanding and agreed to proceed.   Historic Elements   Ms. Holly KendallCarla Srinivasan is a 48 y.o. year old, female patient evaluated today after her last encounter by our practice on 03/03/2018. [name]  has a past medical history of Acid reflux (12/15/2011), Anxiety, generalized (12/15/2011), Arthritis, Cervical pain (12/15/2011), Depression, GERD (gastroesophageal reflux disease), Hiatal hernia, LBP (low back pain) (12/15/2011), Migraine, Muscle ache (12/15/2011), Pain (12/27/2012), Rheumatoid arthritis (HCC) (12/15/2011),  and Scratched cornea (10/25/2015). She also  has a past surgical history that includes Cholecystectomy. [name] has a current medication list which includes the following prescription(s): albuterol, biotin, bupropion, bupropion, buspirone, calcium carbonate-vitamin d, vitamin d3, citalopram, cyclobenzaprine, diazepam, epinephrine, hydroxychloroquine, ibuprofen, levonorgestrel, multi-vitamins, nature-throid, oxycodone, oxycodone, oxycodone, potassium chloride sa, prednisone, and ranitidine. She  reports that she has quit smoking. She has never used smokeless tobacco. She reports that she does not drink alcohol or use drugs. [name] is allergic to bee venom; honey bee venom; penicillins; shellfish allergy; shellfish-derived products; sulfa antibiotics; erythromycin; sulfamethizole; zaleplon; codeine; erythromycin base; and trazodone.   HPI  I last saw her on 03/03/2018. She is being evaluated for medication management. She is having 1/10 middle and low back pain. She admits that the pain goes down her right leg into her heel. She has some numbness tingling and weakness. She has been more active trying to help her father. She does not feel like her pain is changing. She denies any new concerns. She denies any side effects.   Pharmacotherapy Assessment  Analgesic:Oxycodone IR 5 mg 2 tablets every 6 hours (40 mg/day of oxycodone per day) MME/day:60 mg/day   Monitoring: Pharmacotherapy: No side-effects or adverse reactions reported. [place] PMP: PDMP reviewed during this encounter.       Compliance: No problems identified. Plan: Refer to "POC".  Review of recent tests  DG Lumbar Spine Complete W/Bend CLINICAL DATA:  Chronic pain, no injury Please comment in the report about spinal instability (>4 mm displacement), in addition to any: Spondylolisthesis (specify retro- or antero- and Grade level) (describe displacement in millimeters )  EXAM: LUMBAR SPINE - COMPLETE WITH BENDING VIEWS  COMPARISON:   None.  FINDINGS: There is mild degenerative change in the facet joints of the lower  lumbar spine. No acute fracture or subluxation. Surgical clips are noted in the right upper quadrant the abdomen. Intrauterine device overlies the central pelvis.  IMPRESSION: Mild lower lumbar degenerative changes.  No spondylolisthesis.  Electronically Signed   By: Norva Pavlov M.D.   On: 02/18/2017 15:47 DG Thoracic Spine 2 View CLINICAL DATA:  Chronic pain, no injury Please comment in the report about spinal instability (>4 mm displacement), in addition to any: Spondylolisthesis (specify retro- or antero- and Grade level) (describe displacement in millimeters);  EXAM: THORACIC SPINE 2 VIEWS  COMPARISON:  None.  FINDINGS: There is mild S shaped scoliosis of the thoracic spine not associated with vertebral body anomalies. Mild degenerative changes are seen in the thoracic spine. No spondylolisthesis. Mild anterior wedge morphology noted at T11 is of indeterminate age. No acute fracture.  IMPRESSION: Mild midthoracic spondylosis.  No spondylolisthesis.  Mild anterior wedging of T11, age indeterminate.  Electronically Signed   By: Norva Pavlov M.D.   On: 02/18/2017 15:44   Office Visit on 03/03/2018  Component Date Value Ref Range Status  . Summary 03/03/2018 FINAL   Final   Comment: ==================================================================== TOXASSURE SELECT 13 (MW) ==================================================================== Test                             Result       Flag       Units Drug Present and Declared for Prescription Verification   Oxycodone                      1523         EXPECTED   ng/mg creat   Oxymorphone                    350          EXPECTED   ng/mg creat   Noroxycodone                   7255         EXPECTED   ng/mg creat    Sources of oxycodone include scheduled prescription medications.    Oxymorphone and noroxycodone are  expected metabolites of    oxycodone. Oxymorphone is also available as a scheduled    prescription medication. Drug Absent but Declared for Prescription Verification   Diazepam                       Not Detected UNEXPECTED ng/mg creat ==================================================================== Test                      Result    Flag   Units      Ref Range   Creatinine                                        22               mg/dL      >=40 ==================================================================== Declared Medications:  The flagging and interpretation on this report are based on the  following declared medications.  Unexpected results may arise from  inaccuracies in the declared medications.  **Note: The testing scope of this panel includes these medications:  Diazepam  Oxycodone  **Note: The testing scope of this panel does not include following  reported medications:  Albuterol  Bupropion  Calcium carbonate (Calcium Carb/Vitamin D)  Cholecalciferol  Citalopram  Cyclobenzaprine  Epinephrine  Hydroxychloroquine  Ibuprofen  Levonorgestrel  Multivitamin  Potassium  Prednisone  Ranitidine  Thyroid: Liothyronine/Levothyroxine  Vitamin B (Biotin)  Vitamin D (Calcium Carb/Vitamin D) ==================================================================== For clinical consultation, please call [phone]. ================================================                          ====================   . Glucose 03/03/2018 99  65 - 99 mg/dL Final  . BUN 25/95/6387 6  6 - 24 mg/dL Final  . Creatinine, Ser 03/03/2018 0.63  0.57 - 1.00 mg/dL Final  . GFR calc non Af Amer 03/03/2018 106  >59 mL/min/1.73 Final  . GFR calc Af Amer 03/03/2018 123  >59 mL/min/1.73 Final  . BUN/Creatinine Ratio 03/03/2018 10  9 - 23 Final  . Sodium 03/03/2018 136  134 - 144 mmol/L Final  . Potassium 03/03/2018 3.9  3.5 - 5.2 mmol/L Final  . Chloride 03/03/2018 97  96 - 106 mmol/L  Final  . Calcium 03/03/2018 8.9  8.7 - 10.2 mg/dL Final  . Total Protein 03/03/2018 6.3  6.0 - 8.5 g/dL Final  . Albumin 56/43/3295 4.6  3.8 - 4.8 g/dL Final                 **Please note reference interval change**  . Globulin, Total 03/03/2018 1.7  1.5 - 4.5 g/dL Final  . Albumin/Globulin Ratio 03/03/2018 2.7* 1.2 - 2.2 Final  . Bilirubin Total 03/03/2018 0.4  0.0 - 1.2 mg/dL Final  . Alkaline Phosphatase 03/03/2018 71  39 - 117 IU/L Final  . AST 03/03/2018 21  0 - 40 IU/L Final  . Magnesium 03/03/2018 2.1  1.6 - 2.3 mg/dL Final  . Vitamin J-88 41/66/0630 386  232 - 1,245 pg/mL Final  . Sed Rate 03/03/2018 2  0 - 32 mm/hr Final  . CRP 03/03/2018 <1  0 - 10 mg/L Final   Assessment  The primary encounter diagnosis was Lumbar spondylosis (L3-4, L4-5, and L5-S1). Diagnoses of Chronic pain syndrome, Muscle spasm, Cervical spondylosis (C5-6 and C6-7), and Myofascial pain were also pertinent to this visit.  Plan of Care  I am having Reizel Fettes maintain her hydroxychloroquine, Nature-Throid, busPIRone, citalopram, Biotin, Vitamin D3, levonorgestrel, Multi-Vitamins, ranitidine, predniSONE, buPROPion, EPINEPHrine, diazepam, Calcium Carbonate-Vitamin D, potassium chloride SA, albuterol, ibuprofen, buPROPion, oxyCODONE, oxyCODONE, oxyCODONE, and cyclobenzaprine.  Pharmacotherapy (Medications Ordered): Meds ordered this encounter  Medications  . oxyCODONE (OXY IR/ROXICODONE) 5 MG immediate release tablet    Sig: Take 2 tablets (10 mg total) by mouth every 6 (six) hours as needed for up to 30 days for severe pain.    Dispense:  240 tablet    Refill:  0    Do not place this medication, or any other prescription from our practice, on "Automatic Refill". Patient may have prescription filled one day early if pharmacy is closed on scheduled refill date.    Order Specific Question:   Supervising Provider    Answer:   Delano Metz [phone]  . oxyCODONE (OXY IR/ROXICODONE) 5 MG immediate release  tablet    Sig: Take 2 tablets (10 mg total) by mouth every 6 (six) hours as needed for up to 30 days for severe pain.    Dispense:  240 tablet    Refill:  0    Do not place this medication, or any other prescription from our practice, on "Automatic Refill". Patient may  have prescription filled one day early if pharmacy is closed on scheduled refill date.    Order Specific Question:   Supervising Provider    Answer:   Delano MetzNAVEIRA, FRANCISCO [phone][[id number]]  . oxyCODONE (OXY IR/ROXICODONE) 5 MG immediate release tablet    Sig: Take 2 tablets (10 mg total) by mouth every 6 (six) hours as needed for up to 30 days for severe pain.    Dispense:  240 tablet    Refill:  0    Do not place this medication, or any other prescription from our practice, on "Automatic Refill". Patient may have prescription filled one day early if pharmacy is closed on scheduled refill date.    Order Specific Question:   Supervising Provider    Answer:   Delano MetzNAVEIRA, FRANCISCO [phone][[id number]]  . cyclobenzaprine (FLEXERIL) 10 MG tablet    Sig: Take 1 tablet (10 mg total) by mouth 3 (three) times daily.    Dispense:  270 tablet    Refill:  0    Do not place this medication, or any other prescription from our practice, on "Automatic Refill". Patient may have prescription filled one day early if pharmacy is closed on scheduled refill date.    Order Specific Question:   Supervising Provider    Answer:   Delano MetzNAVEIRA, FRANCISCO [phone][[id number]]   Orders:  No orders of the defined types were placed in this encounter.  Follow-up plan:   Return in about 3 months (around 09/01/2018) for MedMgmt.   I discussed the assessment and treatment plan with the patient. The patient was provided an opportunity to ask questions and all were answered. The patient agreed with the plan and demonstrated an understanding of the instructions.  Patient advised to call back or seek an in-person evaluation if the symptoms or condition worsens.  Total duration of non-face-to-face  encounter: 12 minutes.  Note by: Thad Rangerrystal , NP Date: 06/02/2018; Time: 1:30 PM  Disclaimer:  * Given the special circumstances of the COVID-19 pandemic, the federal government has announced that the Office for Civil Rights (OCR) will exercise its enforcement discretion and will not impose penalties on physicians using telehealth in the event of noncompliance with regulatory requirements under the [employer] Insurance Portability and Accountability Act (HIPAA) in connection with the good faith provision of telehealth during the COVID-19 national public health emergency. (AMA)

## 2018-06-14 DIAGNOSIS — J311 Chronic nasopharyngitis: Secondary | ICD-10-CM | POA: Diagnosis not present

## 2018-06-14 DIAGNOSIS — J301 Allergic rhinitis due to pollen: Secondary | ICD-10-CM | POA: Diagnosis not present

## 2018-06-14 DIAGNOSIS — L309 Dermatitis, unspecified: Secondary | ICD-10-CM | POA: Diagnosis not present

## 2018-08-31 ENCOUNTER — Encounter: Payer: Medicare Other | Admitting: Pain Medicine

## 2018-09-02 DIAGNOSIS — M0609 Rheumatoid arthritis without rheumatoid factor, multiple sites: Secondary | ICD-10-CM | POA: Diagnosis not present

## 2018-09-02 DIAGNOSIS — M79641 Pain in right hand: Secondary | ICD-10-CM | POA: Diagnosis not present

## 2018-09-02 DIAGNOSIS — M79642 Pain in left hand: Secondary | ICD-10-CM | POA: Diagnosis not present

## 2018-09-02 DIAGNOSIS — M5414 Radiculopathy, thoracic region: Secondary | ICD-10-CM | POA: Diagnosis not present

## 2018-09-05 ENCOUNTER — Encounter: Payer: Self-pay | Admitting: Pain Medicine

## 2018-09-06 ENCOUNTER — Ambulatory Visit: Payer: 59 | Attending: Pain Medicine | Admitting: Pain Medicine

## 2018-09-06 ENCOUNTER — Other Ambulatory Visit: Payer: Self-pay

## 2018-09-06 DIAGNOSIS — M62838 Other muscle spasm: Secondary | ICD-10-CM

## 2018-09-06 DIAGNOSIS — G894 Chronic pain syndrome: Secondary | ICD-10-CM

## 2018-09-06 DIAGNOSIS — M25562 Pain in left knee: Secondary | ICD-10-CM

## 2018-09-06 DIAGNOSIS — M7918 Myalgia, other site: Secondary | ICD-10-CM

## 2018-09-06 DIAGNOSIS — M25561 Pain in right knee: Secondary | ICD-10-CM | POA: Diagnosis not present

## 2018-09-06 DIAGNOSIS — M5442 Lumbago with sciatica, left side: Secondary | ICD-10-CM

## 2018-09-06 DIAGNOSIS — G8929 Other chronic pain: Secondary | ICD-10-CM

## 2018-09-06 DIAGNOSIS — M5441 Lumbago with sciatica, right side: Secondary | ICD-10-CM

## 2018-09-06 MED ORDER — OXYCODONE HCL 10 MG PO TABS: 10.0000 mg | ORAL_TABLET | Freq: Four times a day (QID) | ORAL | 0 refills | Status: DC | PRN

## 2018-09-06 MED ORDER — CYCLOBENZAPRINE HCL 10 MG PO TABS: 10.0000 mg | ORAL_TABLET | Freq: Three times a day (TID) | ORAL | 0 refills | Status: DC

## 2018-09-06 NOTE — Progress Notes (Signed)
Pain Management Virtual Encounter Note - Virtual Visit via Telephone Telehealth (real-time audio visits between healthcare provider and patient).   Patient's Phone No. & Preferred Pharmacy:  [phone] (home); [phone] (mobile); (Preferred) [phone] No e-mail address on record  [facility] [place], [place] - [address] [place] [address] Phone: [phone] Fax: [phone]    Pre-screening note:  Our staff contacted Holly Spears and offered her an "in person", "face-to-face" appointment versus a telephone encounter. She indicated preferring the telephone encounter, at this time.   Reason for Virtual Visit: COVID-19*  Social distancing based on CDC and AMA recommendations.   I contacted Holly Spears on 09/06/2018 via telephone.      I clearly identified myself as Holly DoneFrancisco A Laquinda Moller, MD. I verified that I was speaking with the correct person using two identifiers (Name: Holly KendallCarla Spears, and date of birth: 01/21/1971).  Advanced Informed Consent I sought verbal advanced consent from Holly Spears for virtual visit interactions. I informed Holly Spears of possible security and privacy concerns, risks, and limitations associated with providing "not-in-person" medical evaluation and management services. I also informed Holly Spears of the availability of "in-person" appointments. Finally, I informed her that there would be a charge for the virtual visit and that she could be  personally, fully or partially, financially responsible for it. Holly Spears expressed understanding and agreed to proceed.   Historic Elements   [name] is a 48 y.o. year old, female patient evaluated today after her last encounter by our practice on 06/02/2018. Holly Spears  has a past medical history of Acid reflux (12/15/2011), Anxiety, generalized (12/15/2011), Arthritis, Cervical pain (12/15/2011), Depression, GERD (gastroesophageal reflux disease), Hiatal hernia, LBP (low  back pain) (12/15/2011), Migraine, Muscle ache (12/15/2011), Pain (12/27/2012), Rheumatoid arthritis (HCC) (12/15/2011), and Scratched cornea (10/25/2015). She also  has a past surgical history that includes Cholecystectomy. Holly Spears has a current medication list which includes the following prescription(s): biotin, bupropion, buspirone, calcium carbonate-vitamin d, vitamin d3, citalopram, cyclobenzaprine, diazepam, epinephrine, famotidine, hydroxychloroquine, ibuprofen, levonorgestrel, multi-vitamins, nature-throid, oxycodone hcl, oxycodone hcl, oxycodone hcl, and potassium chloride sa. She  reports that she has quit smoking. She has never used smokeless tobacco. She reports that she does not drink alcohol or use drugs. Holly Spears is allergic to bee venom; honey bee venom; penicillins; shellfish allergy; shellfish-derived products; sulfa antibiotics; erythromycin; sulfamethizole; zaleplon; codeine; erythromycin base; and trazodone.   HPI  Today, she is being contacted for medication management.  Pharmacotherapy Assessment  Analgesic: Oxycodone IR 10 mg, 1 tab PO q 6 hrs (40 mg/day of oxycodone) MME/day:60 mg/day.   Monitoring: Pharmacotherapy: No side-effects or adverse reactions reported. [place] PMP: PDMP reviewed during this encounter.       Compliance: No problems identified. Effectiveness: Clinically acceptable. Plan: Refer to "POC".  UDS:  Summary  Date Value Ref Range Status  03/03/2018 FINAL  Final    Comment:    ==================================================================== TOXASSURE SELECT 13 (MW) ==================================================================== Test                             Result       Flag       Units Drug Present and Declared for Prescription Verification   Oxycodone                      1523         EXPECTED  ng/mg creat   Oxymorphone                    350          EXPECTED   ng/mg creat   Noroxycodone                   7255         EXPECTED    ng/mg creat    Sources of oxycodone include scheduled prescription medications.    Oxymorphone and noroxycodone are expected metabolites of    oxycodone. Oxymorphone is also available as a scheduled    prescription medication. Drug Absent but Declared for Prescription Verification   Diazepam                       Not Detected UNEXPECTED ng/mg creat ==================================================================== Test                      Result    Flag   Units      Ref Range   Creatinine              22               mg/dL      >=15 ==================================================================== Declared Medications:  The flagging and interpretation on this report are based on the  following declared medications.  Unexpected results may arise from  inaccuracies in the declared medications.  **Note: The testing scope of this panel includes these medications:  Diazepam  Oxycodone  **Note: The testing scope of this panel does not include following  reported medications:  Albuterol  Bupropion  Calcium carbonate (Calcium Carb/Vitamin D)  Cholecalciferol  Citalopram  Cyclobenzaprine  Epinephrine  Hydroxychloroquine  Ibuprofen  Levonorgestrel  Multivitamin  Potassium  Prednisone  Ranitidine  Thyroid: Liothyronine/Levothyroxine  Vitamin B (Biotin)  Vitamin D (Calcium Carb/Vitamin D) ==================================================================== For clinical consultation, please call [phone]. ====================================================================    Laboratory Chemistry Profile (12 mo)  Renal: 03/03/2018: BUN 6; BUN/Creatinine Ratio 10; Creatinine, Ser 0.63; GFR calc Af Amer 123; GFR calc non Af Amer 106  Hepatic: 03/03/2018: Albumin 4.6; AST 21 Other: 03/03/2018: CRP <1; Sed Rate 2; Vitamin B-12 386 Note: Above Lab results reviewed.  Imaging  Last 90 days:  No results found. Last Hospital Admission:  Dg Thoracic Spine 2 View  Result Date:  02/18/2017 CLINICAL DATA:  Chronic pain, no injury Please comment in the report about spinal instability (>4 mm displacement), in addition to any: Spondylolisthesis (specify retro- or antero- and Grade level) (describe displacement in millimeters); EXAM: THORACIC SPINE 2 VIEWS COMPARISON:  None. FINDINGS: There is mild S shaped scoliosis of the thoracic spine not associated with vertebral body anomalies. Mild degenerative changes are seen in the thoracic spine. No spondylolisthesis. Mild anterior wedge morphology noted at T11 is of indeterminate age. No acute fracture. IMPRESSION: Mild midthoracic spondylosis.  No spondylolisthesis. Mild anterior wedging of T11, age indeterminate. Electronically Signed   By: Norva Pavlov M.D.   On: 02/18/2017 15:44   Dg Lumbar Spine Complete W/bend  Result Date: 02/18/2017 CLINICAL DATA:  Chronic pain, no injury Please comment in the report about spinal instability (>4 mm displacement), in addition to any: Spondylolisthesis (specify retro- or antero- and Grade level) (describe displacement in millimeters ) EXAM: LUMBAR SPINE - COMPLETE WITH BENDING VIEWS COMPARISON:  None. FINDINGS: There is mild degenerative change in the facet joints of the lower lumbar spine. No  acute fracture or subluxation. Surgical clips are noted in the right upper quadrant the abdomen. Intrauterine device overlies the central pelvis. IMPRESSION: Mild lower lumbar degenerative changes. No spondylolisthesis. Electronically Signed   By: Norva Pavlov M.D.   On: 02/18/2017 15:47   Assessment  The primary encounter diagnosis was Chronic pain syndrome. Diagnoses of Chronic low back pain (Primary Area of Pain) (Bilateral) (R>L), Chronic knee pain (Secondary area of Pain) (Bilateral) (R>L), Chronic musculoskeletal pain, and Muscle spasm were also pertinent to this visit.  Plan of Care  I have discontinued Holly Spears's ranitidine, predniSONE, albuterol, oxyCODONE, and oxyCODONE. I have also changed  her oxyCODONE to Oxycodone HCl. Additionally, I am having her start on Oxycodone HCl and Oxycodone HCl. Lastly, I am having her maintain her hydroxychloroquine, Nature-Throid, busPIRone, citalopram, Biotin, Vitamin D3, levonorgestrel, Multi-Vitamins, buPROPion, EPINEPHrine, diazepam, Calcium Carbonate-Vitamin D, potassium chloride SA, ibuprofen, famotidine, and cyclobenzaprine.  Pharmacotherapy (Medications Ordered): Meds ordered this encounter  Medications  . oxyCODONE 10 MG TABS    Sig: Take 1 tablet (10 mg total) by mouth every 6 (six) hours as needed for severe pain. Must last 30 days    Dispense:  120 tablet    Refill:  0    Chronic Pain: STOP Act (Not applicable) Fill 1 day early if closed on refill date. Do not fill until: 09/11/2018. To last until: 10/11/2018. Avoid benzodiazepines within 8 hours of opioids  . cyclobenzaprine (FLEXERIL) 10 MG tablet    Sig: Take 1 tablet (10 mg total) by mouth 3 (three) times daily.    Dispense:  270 tablet    Refill:  0    Fill one day early if pharmacy is closed on scheduled refill date. May substitute for generic if available.  Marland Kitchen oxyCODONE 10 MG TABS    Sig: Take 1 tablet (10 mg total) by mouth every 6 (six) hours as needed for severe pain. Must last 30 days    Dispense:  120 tablet    Refill:  0    Chronic Pain: STOP Act (Not applicable) Fill 1 day early if closed on refill date. Do not fill until: 10/11/2018. To last until: 11/10/2018. Avoid benzodiazepines within 8 hours of opioids  . oxyCODONE 10 MG TABS    Sig: Take 1 tablet (10 mg total) by mouth every 6 (six) hours as needed for severe pain. Must last 30 days    Dispense:  120 tablet    Refill:  0    Chronic Pain: STOP Act (Not applicable) Fill 1 day early if closed on refill date. Do not fill until: 11/10/2018. To last until: 12/10/2018. Avoid benzodiazepines within 8 hours of opioids   Orders:  No orders of the defined types were placed in this encounter.  Follow-up plan:   Return in about 3  months (around 12/05/2018) for (VV), E/M (MM).      Interventional therapies: Planned, scheduled, and/or pending:   The patient apparently had a bad experience with another physician receiving blocks.    Considering:   Diagnostic bilateral lumbar facet block  Possible bilateral lumbar facet RFA  Diagnostic right-sided L5-S1 LESI  Possible right-sided L5-S1 vs S1 TFESI  Diagnostic bilateral intra-articular knee injections (w/ steroid)  Possible series of 5 IA Hyalgan knee injections  Diagnostic bilateral Genicular NB  Possible bilateral Genicular nerve RFA    Palliative PRN treatment(s):   Diagnostic bilateral lumbar facet block  Diagnostic right-sided L5-S1 LESI  Diagnostic bilateral IA knee injections (w/ steroid)  Diagnostic bilateral Genicular  NB     Recent Visits No visits were found meeting these conditions.  Showing recent visits within past 90 days and meeting all other requirements   Today's Visits Date Type Provider Dept  09/06/18 Office Visit Milinda Pointer, MD [facility]  Showing today's visits and meeting all other requirements   Future Appointments No visits were found meeting these conditions.  Showing future appointments within next 90 days and meeting all other requirements   I discussed the assessment and treatment plan with the patient. The patient was provided an opportunity to ask questions and all were answered. The patient agreed with the plan and demonstrated an understanding of the instructions.  Patient advised to call back or seek an in-person evaluation if the symptoms or condition worsens.  Total duration of non-face-to-face encounter: 12 minutes.  Note by: Gaspar Cola, MD Date: 09/06/2018; Time: 3:32 PM  Note: This dictation was prepared with Dragon dictation. Any transcriptional errors that may result from this process are unintentional.  Disclaimer:  * Given the special circumstances of the COVID-19 pandemic, the  federal government has announced that the Office for Civil Rights (OCR) will exercise its enforcement discretion and will not impose penalties on physicians using telehealth in the event of noncompliance with regulatory requirements under the [place] (HIPAA) in connection with the good faith provision of telehealth during the DJTTS-17 national public health emergency. ([place])

## 2018-09-06 NOTE — Patient Instructions (Signed)
____________________________________________________________________________________________  Medication Rules  Purpose: To inform patients, and their family members, of our rules and regulations.  Applies to: All patients receiving prescriptions (written or electronic).  Pharmacy of record: Pharmacy where electronic prescriptions will be sent. If written prescriptions are taken to a different pharmacy, please inform the nursing staff. The pharmacy listed in the electronic medical record should be the one where you would like electronic prescriptions to be sent.  Electronic prescriptions: In compliance with the [place] Strengthen Opioid Misuse Prevention (STOP) Act of 2017 (Session Law 2017-74/H243), effective February 02, 2018, all controlled substances must be electronically prescribed. Calling prescriptions to the pharmacy will cease to exist.  Prescription refills: Only during scheduled appointments. Applies to all prescriptions.  NOTE: The following applies primarily to controlled substances (Opioid* Pain Medications).   Patient's responsibilities: 1. Pain Pills: Bring all pain pills to every appointment (except for procedure appointments). 2. Pill Bottles: Bring pills in original pharmacy bottle. Always bring the newest bottle. Bring bottle, even if empty. 3. Medication refills: You are responsible for knowing and keeping track of what medications you take and those you need refilled. The day before your appointment: write a list of all prescriptions that need to be refilled. The day of the appointment: give the list to the admitting nurse. Prescriptions will be written only during appointments. No prescriptions will be written on procedure days. If you forget a medication: it will not be "Called in", "Faxed", or "electronically sent". You will need to get another appointment to get these prescribed. No early refills. Do not call asking to have your prescription filled  early. 4. Prescription Accuracy: You are responsible for carefully inspecting your prescriptions before leaving our office. Have the discharge nurse carefully go over each prescription with you, before taking them home. Make sure that your name is accurately spelled, that your address is correct. Check the name and dose of your medication to make sure it is accurate. Check the number of pills, and the written instructions to make sure they are clear and accurate. Make sure that you are given enough medication to last until your next medication refill appointment. 5. Taking Medication: Take medication as prescribed. When it comes to controlled substances, taking less pills or less frequently than prescribed is permitted and encouraged. Never take more pills than instructed. Never take medication more frequently than prescribed.  6. Inform other Doctors: Always inform, all of your healthcare providers, of all the medications you take. 7. Pain Medication from other Providers: You are not allowed to accept any additional pain medication from any other Doctor or Healthcare provider. There are two exceptions to this rule. (see below) In the event that you require additional pain medication, you are responsible for notifying us, as stated below. 8. Medication Agreement: You are responsible for carefully reading and following our Medication Agreement. This must be signed before receiving any prescriptions from our practice. Safely store a copy of your signed Agreement. Violations to the Agreement will result in no further prescriptions. (Additional copies of our Medication Agreement are available upon request.) 9. Laws, Rules, & Regulations: All patients are expected to follow all Federal and State Laws, Statutes, Rules, & Regulations. Ignorance of the Laws does not constitute a valid excuse. The use of any illegal substances is prohibited. 10. Adopted CDC guidelines & recommendations: Target dosing levels will be  at or below 60 MME/day. Use of benzodiazepines** is not recommended.  Exceptions: There are only two exceptions to the rule of not   receiving pain medications from other Healthcare Providers. 1. Exception #1 (Emergencies): In the event of an emergency (i.e.: accident requiring emergency care), you are allowed to receive additional pain medication. However, you are responsible for: As soon as you are able, call our office [phone], at any time of the day or night, and leave a message stating your name, the date and nature of the emergency, and the name and dose of the medication prescribed. In the event that your call is answered by a member of our staff, make sure to document and save the date, time, and the name of the person that took your information.  2. Exception #2 (Planned Surgery): In the event that you are scheduled by another doctor or dentist to have any type of surgery or procedure, you are allowed (for a period no longer than 30 days), to receive additional pain medication, for the acute post-op pain. However, in this case, you are responsible for picking up a copy of our "Post-op Pain Management for Surgeons" handout, and giving it to your surgeon or dentist. This document is available at our office, and does not require an appointment to obtain it. Simply go to our office during business hours (Monday-Thursday from 8:00 AM to 4:00 PM) (Friday 8:00 AM to 12:00 Noon) or if you have a scheduled appointment with us, prior to your surgery, and ask for it by name. In addition, you will need to provide us with your name, name of your surgeon, type of surgery, and date of procedure or surgery.  *Opioid medications include: morphine, codeine, oxycodone, oxymorphone, hydrocodone, hydromorphone, meperidine, tramadol, tapentadol, buprenorphine, fentanyl, methadone. **Benzodiazepine medications include: diazepam (Valium), alprazolam (Xanax), clonazepam (Klonopine), lorazepam (Ativan), clorazepate  (Tranxene), chlordiazepoxide (Librium), estazolam (Prosom), oxazepam (Serax), temazepam (Restoril), triazolam (Halcion) (Last updated: 04/01/2017) ____________________________________________________________________________________________   ____________________________________________________________________________________________  Medication Recommendations and Reminders  Applies to: All patients receiving prescriptions (written and/or electronic).  Medication Rules & Regulations: These rules and regulations exist for your safety and that of others. They are not flexible and neither are we. Dismissing or ignoring them will be considered "non-compliance" with medication therapy, resulting in complete and irreversible termination of such therapy. (See document titled "Medication Rules" for more details.) In all conscience, because of safety reasons, we cannot continue providing a therapy where the patient does not follow instructions.  Pharmacy of record:   Definition: This is the pharmacy where your electronic prescriptions will be sent.   We do not endorse any particular pharmacy.  You are not restricted in your choice of pharmacy.  The pharmacy listed in the electronic medical record should be the one where you want electronic prescriptions to be sent.  If you choose to change pharmacy, simply notify our nursing staff of your choice of new pharmacy.  Recommendations:  Keep all of your pain medications in a safe place, under lock and key, even if you live alone.   After you fill your prescription, take 1 week's worth of pills and put them away in a safe place. You should keep a separate, properly labeled bottle for this purpose. The remainder should be kept in the original bottle. Use this as your primary supply, until it runs out. Once it's gone, then you know that you have 1 week's worth of medicine, and it is time to come in for a prescription refill. If you do this correctly, it  is unlikely that you will ever run out of medicine.  To make sure that the above recommendation works,   it is very important that you make sure your medication refill appointments are scheduled at least 1 week before you run out of medicine. To do this in an effective manner, make sure that you do not leave the office without scheduling your next medication management appointment. Always ask the nursing staff to show you in your prescription , when your medication will be running out. Then arrange for the receptionist to get you a return appointment, at least 7 days before you run out of medicine. Do not wait until you have 1 or 2 pills left, to come in. This is very poor planning and does not take into consideration that we may need to cancel appointments due to bad weather, sickness, or emergencies affecting our staff.  "Partial Fill": If for any reason your pharmacy does not have enough pills/tablets to completely fill or refill your prescription, do not allow for a "partial fill". You will need a separate prescription to fill the remaining amount, which we will not provide. If the reason for the partial fill is your insurance, you will need to talk to the pharmacist about payment alternatives for the remaining tablets, but again, do not accept a partial fill.  Prescription refills and/or changes in medication(s):   Prescription refills, and/or changes in dose or medication, will be conducted only during scheduled medication management appointments. (Applies to both, written and electronic prescriptions.)  No refills on procedure days. No medication will be changed or started on procedure days. No changes, adjustments, and/or refills will be conducted on a procedure day. Doing so will interfere with the diagnostic portion of the procedure.  No phone refills. No medications will be "called into the pharmacy".  No Fax refills.  No weekend refills.  No Holliday refills.  No after hours  refills.  Remember:  Business hours are:  Monday to Thursday 8:00 AM to 4:00 PM Provider's Schedule: Galia Rahm, MD - Appointments are:  Medication management: Monday and Wednesday 8:00 AM to 4:00 PM Procedure day: Tuesday and Thursday 7:30 AM to 4:00 PM Bilal Lateef, MD - Appointments are:  Medication management: Tuesday and Thursday 8:00 AM to 4:00 PM Procedure day: Monday and Wednesday 7:30 AM to 4:00 PM (Last update: 04/01/2017) ____________________________________________________________________________________________    

## 2018-11-21 IMAGING — CR DG THORACIC SPINE 2V
3 series · 3 of 3 positions shown · non-contrast
Comparison: None.

CLINICAL DATA: Chronic pain, no injury Please comment in the report
about spinal instability (>4 mm displacement), in addition to any:
Spondylolisthesis (specify retro- or antero- and Grade level)
(describe displacement in millimeters);

EXAM:
THORACIC SPINE 2 VIEWS

[t-spine ap]
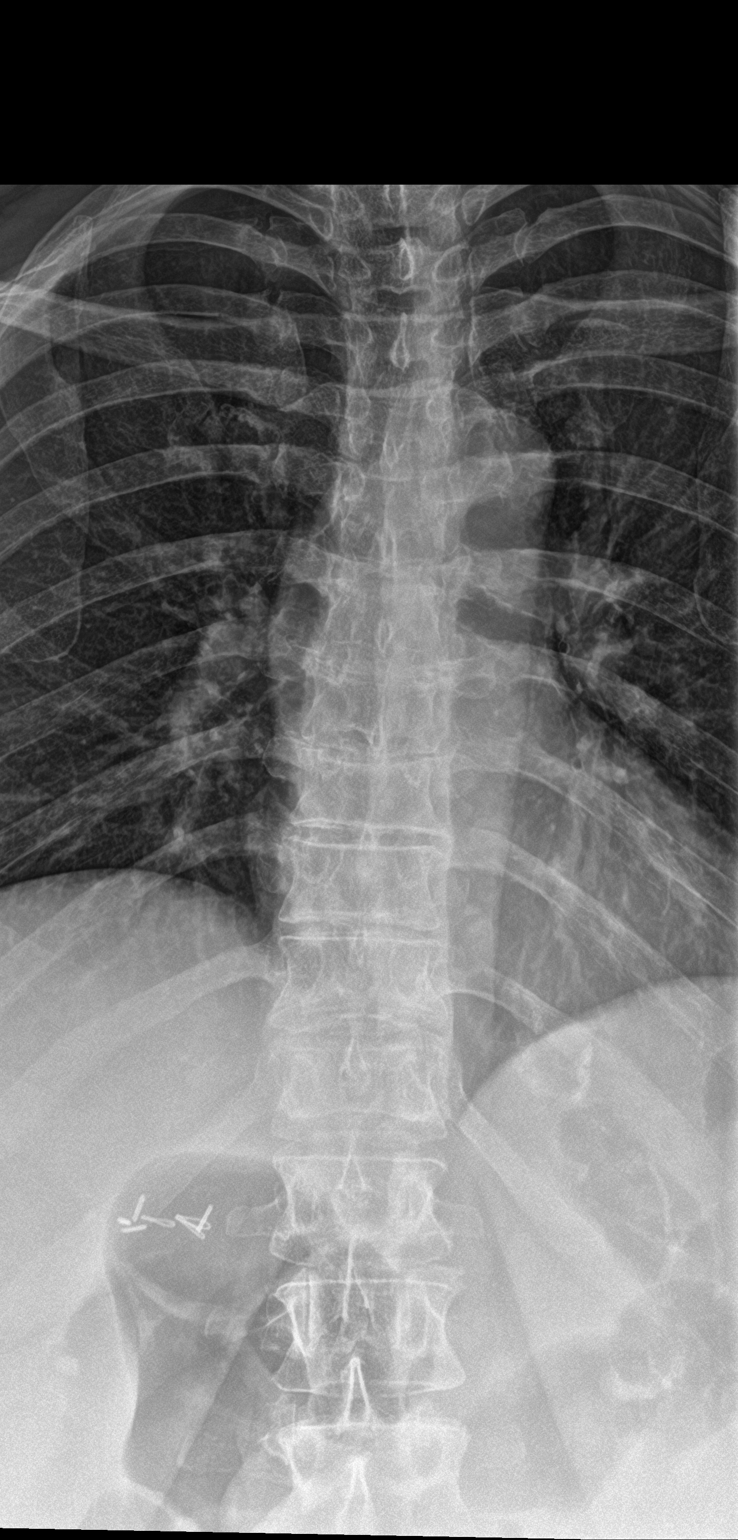

[t-spine lat]
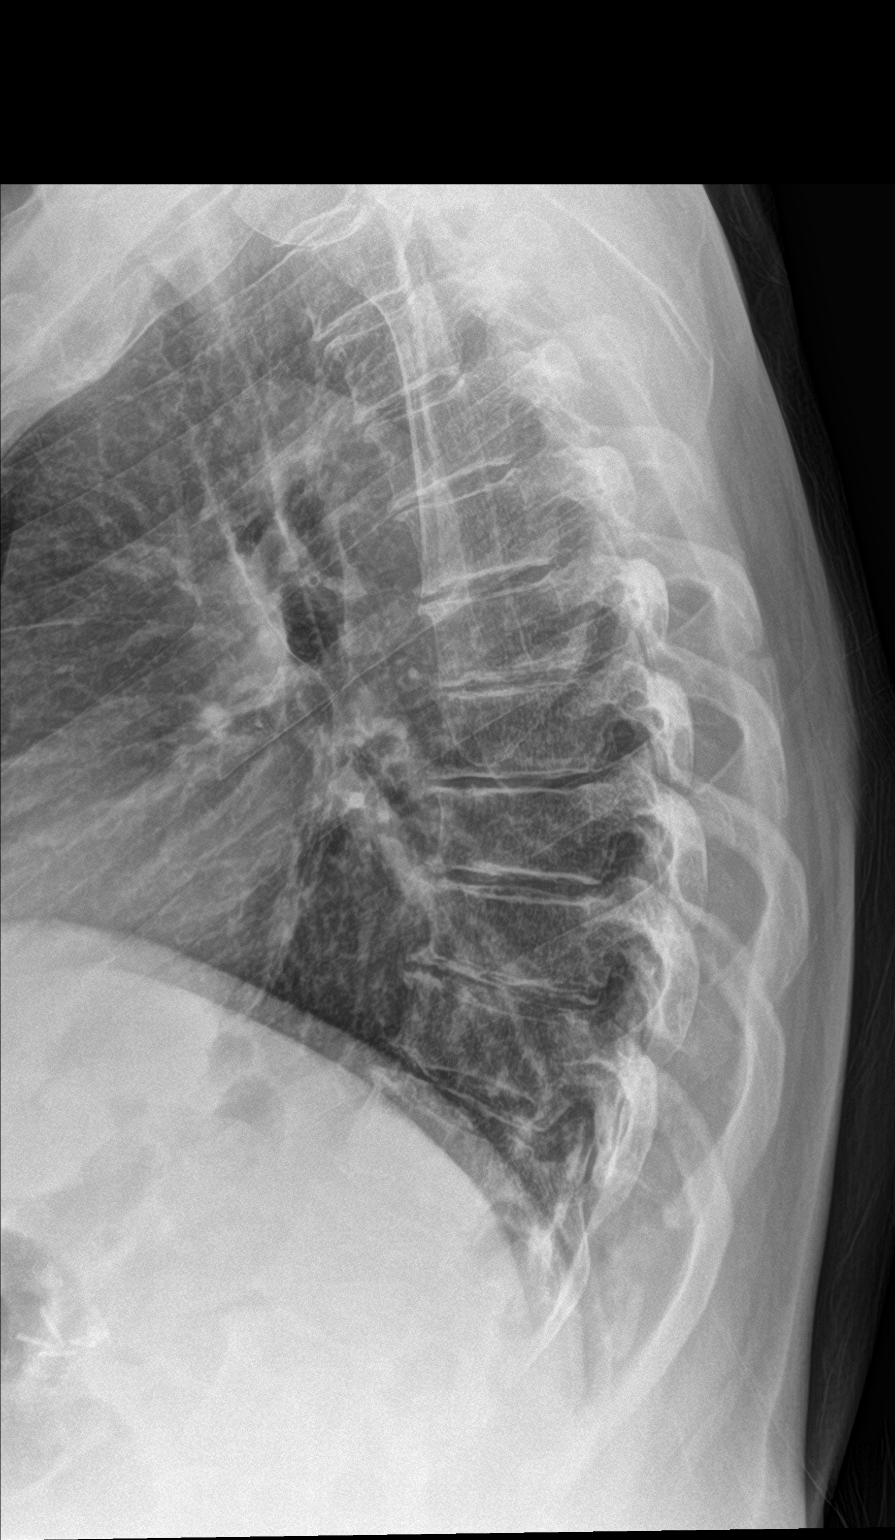

[t-spine swimmers]
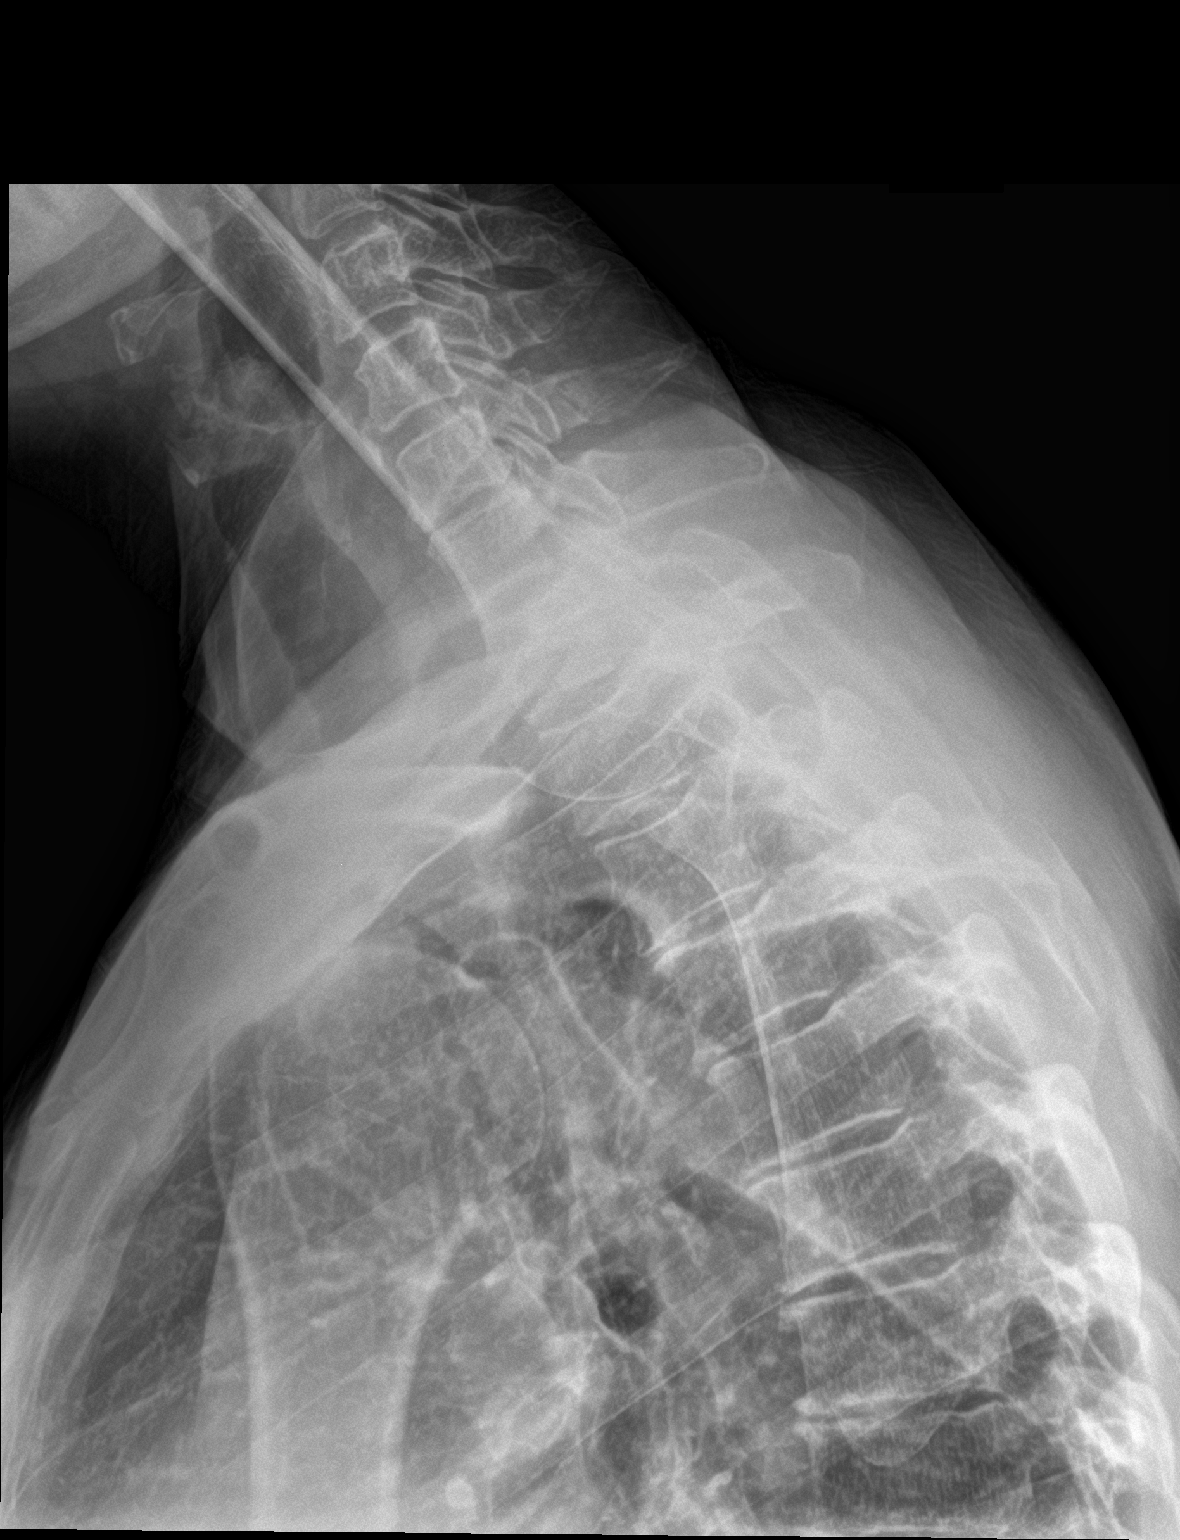

[3 of 3 positions shown; findings below may reference images not displayed]

FINDINGS: There is mild S shaped scoliosis of the thoracic spine not
associated with vertebral body anomalies. Mild degenerative changes
are seen in the thoracic spine. No spondylolisthesis. Mild anterior
wedge morphology noted at T11 is of indeterminate age. No acute
fracture.
IMPRESSION: Mild midthoracic spondylosis.  No spondylolisthesis.

Mild anterior wedging of T11, age indeterminate.

## 2018-11-21 IMAGING — CR DG LUMBAR SPINE COMPLETE W/ BEND
7 series · 7 of 7 positions shown · non-contrast
Comparison: None.

CLINICAL DATA: Chronic pain, no injury Please comment in the report
about spinal instability (>4 mm displacement), in addition to any:
Spondylolisthesis (specify retro- or antero- and Grade level)
(describe displacement in millimeters )

EXAM:
LUMBAR SPINE - COMPLETE WITH BENDING VIEWS

[l-spine ap]
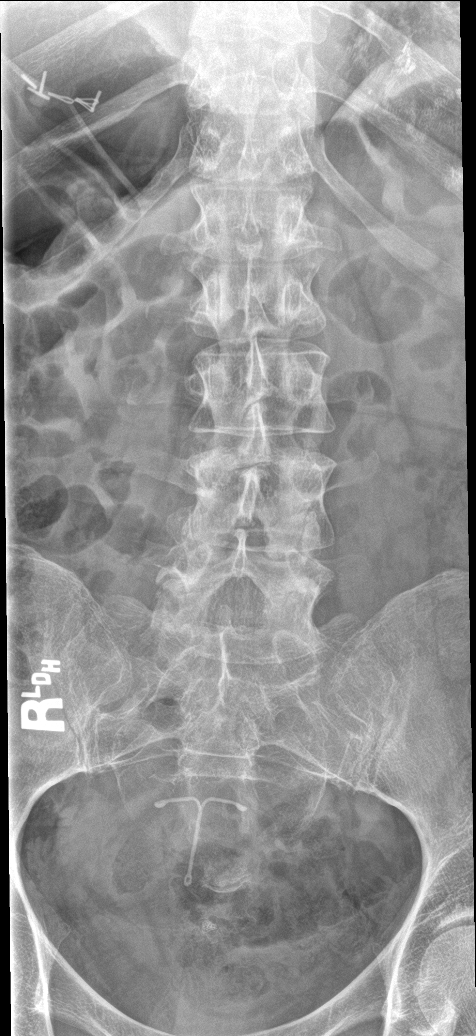

[l-spine obl (1 of 2)]
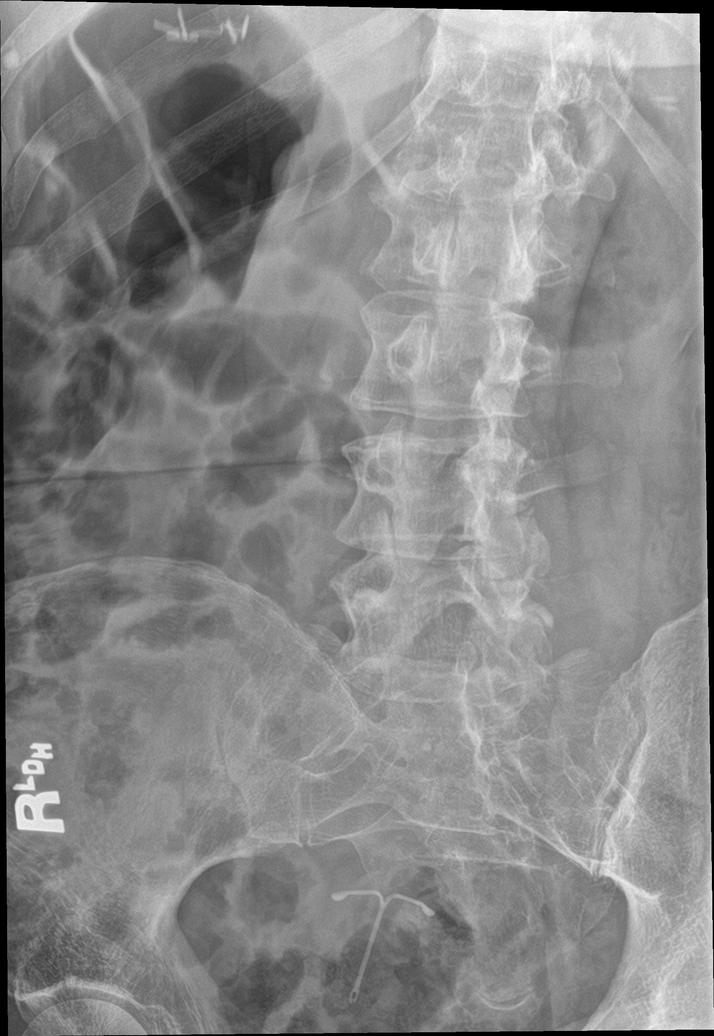

[l-spine obl (2 of 2)]
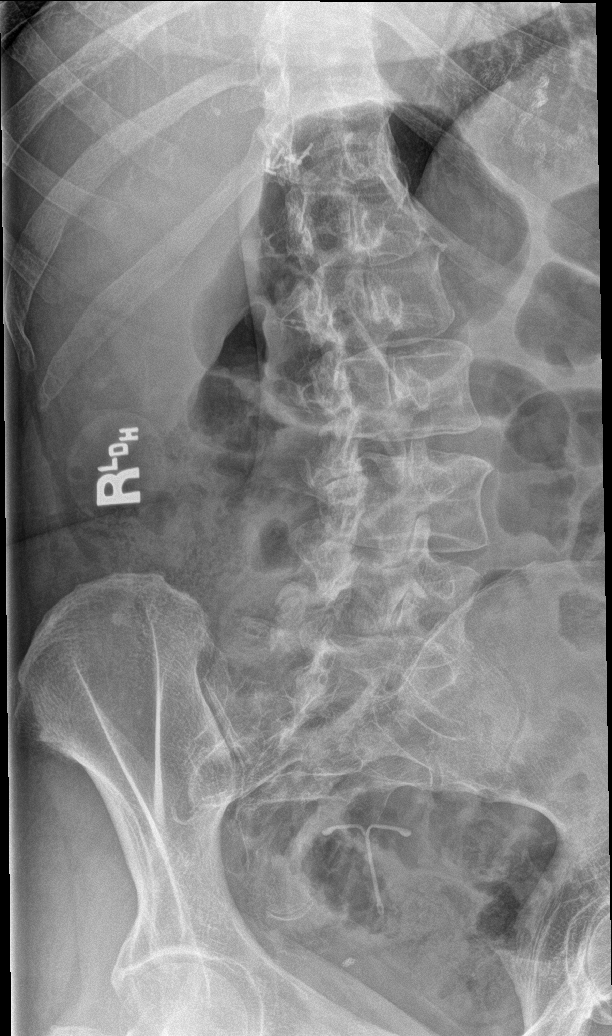

[l-spine lat]
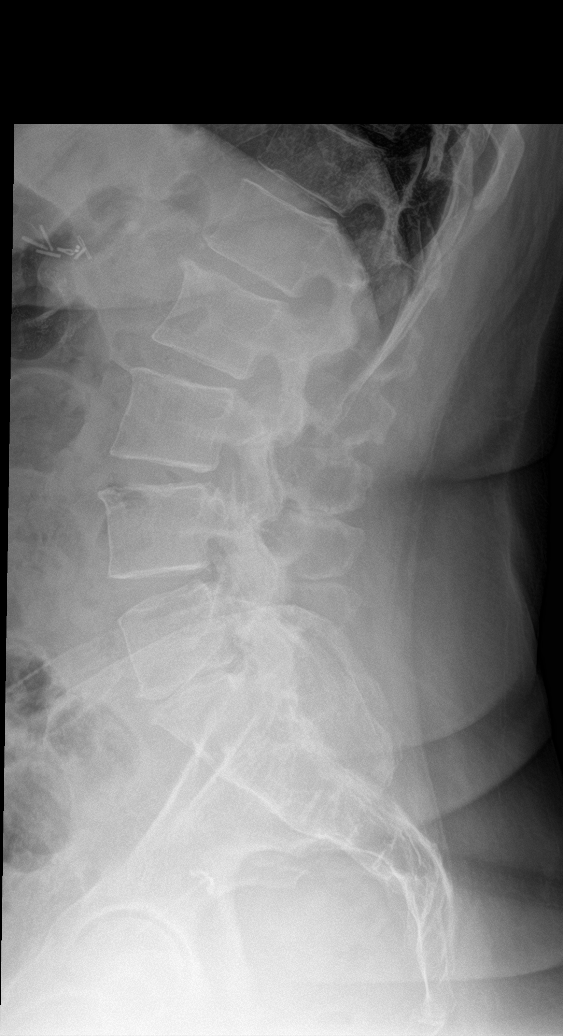

[l-spine flex]
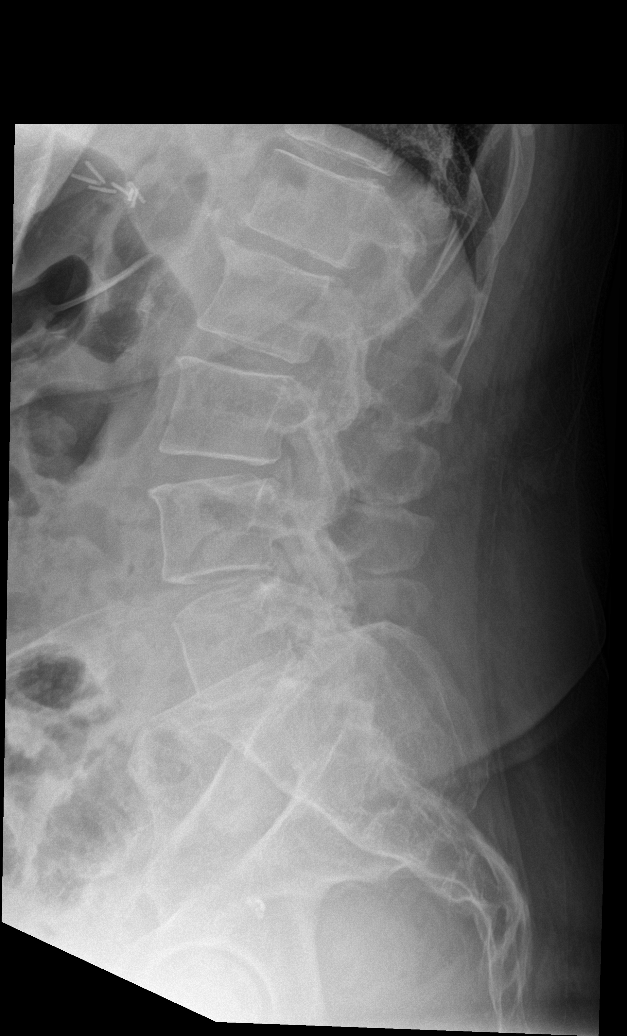

[l-spine ext]
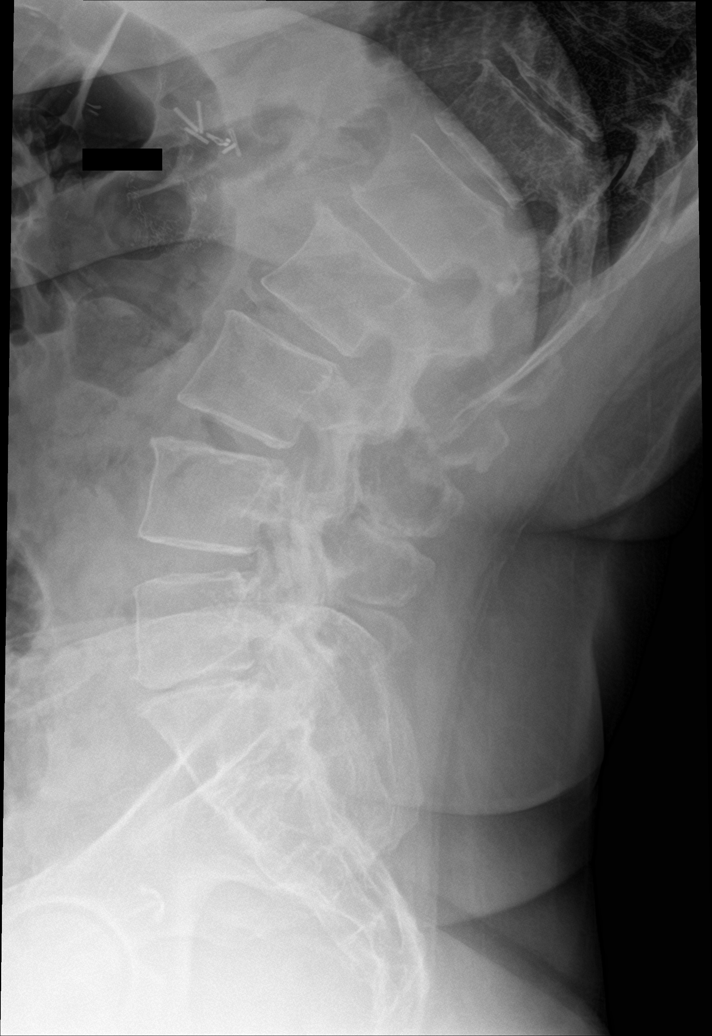

[l-spine spot]
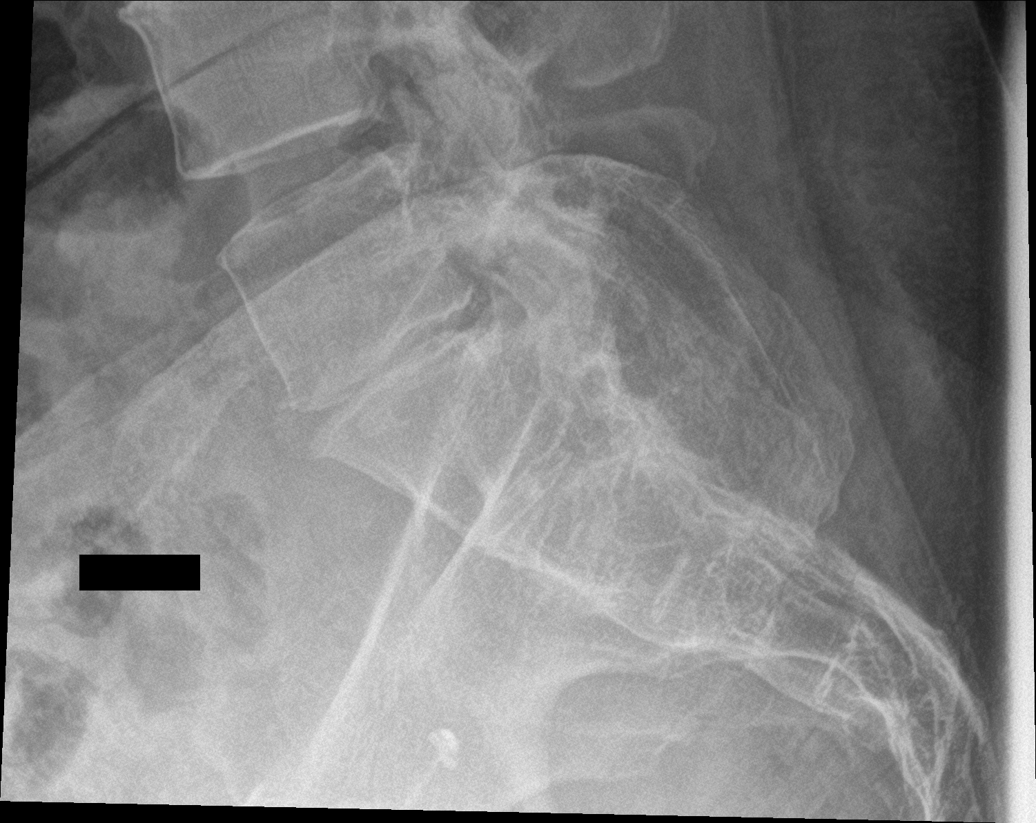

[7 of 7 positions shown; findings below may reference images not displayed]

FINDINGS: There is mild degenerative change in the facet joints of the lower
lumbar spine. No acute fracture or subluxation. Surgical clips are
noted in the right upper quadrant the abdomen. Intrauterine device
overlies the central pelvis.
IMPRESSION: Mild lower lumbar degenerative changes.

No spondylolisthesis.

## 2018-11-24 ENCOUNTER — Encounter: Payer: Self-pay | Admitting: Pain Medicine

## 2018-11-27 NOTE — Progress Notes (Signed)
Pain Management Virtual Encounter Note - Virtual Visit via Telephone Telehealth (real-time audio visits between healthcare provider and patient).   Patient's Phone No. & Preferred Pharmacy:  [phone] (home); [phone] (mobile); (Preferred) [phone] No e-mail address on record  [facility] [place], [place] - [address] [place] [address] Phone: [phone] Fax: [phone]    Pre-screening note:  Our staff contacted Holly Spears and offered her an "in person", "face-to-face" appointment versus a telephone encounter. She indicated preferring the telephone encounter, at this time.   Reason for Virtual Visit: COVID-19*  Social distancing based on CDC and AMA recommendations.   I contacted Holly Spears on 11/28/2018 via telephone.      I clearly identified myself as Oswaldo Done, MD. I verified that I was speaking with the correct person using two identifiers (Name: Holly Spears, and date of birth: 1970-09-11).  Advanced Informed Consent I sought verbal advanced consent from Holly Spears for virtual visit interactions. I informed Holly Spears of possible security and privacy concerns, risks, and limitations associated with providing "not-in-person" medical evaluation and management services. I also informed Holly Spears of the availability of "in-person" appointments. Finally, I informed her that there would be a charge for the virtual visit and that she could be  personally, fully or partially, financially responsible for it. Holly Spears expressed understanding and agreed to proceed.   Historic Elements   Holly Spears is a 48 y.o. year old, female patient evaluated today after her last encounter by our practice on 09/06/2018. Holly Spears  has a past medical history of Acid reflux (12/15/2011), Anxiety, generalized (12/15/2011), Arthritis, Cervical pain (12/15/2011), Depression, GERD (gastroesophageal reflux disease), Hiatal hernia, LBP (low  back pain) (12/15/2011), Migraine, Muscle ache (12/15/2011), Pain (12/27/2012), Rheumatoid arthritis (HCC) (12/15/2011), and Scratched cornea (10/25/2015). She also  has a past surgical history that includes Cholecystectomy. Holly Spears has a current medication list which includes the following prescription(s): biotin, buspirone, calcium carbonate-vitamin d, vitamin d3, citalopram, cyclobenzaprine, diazepam, epinephrine, famotidine, hydroxychloroquine, ibuprofen, levonorgestrel, multi-vitamins, nature-throid, oxycodone hcl, oxycodone hcl, oxycodone hcl, potassium chloride sa, and bupropion. She  reports that she has quit smoking. She has never used smokeless tobacco. She reports that she does not drink alcohol or use drugs. Holly Spears is allergic to bee venom; honey bee venom; penicillins; shellfish allergy; sulfa antibiotics; erythromycin; sulfamethizole; zaleplon; codeine; erythromycin base; and trazodone.   HPI  Today, she is being contacted for medication management.  The patient indicates doing well with the current medication regimen. No adverse reactions or side effects reported to the medications.   Pharmacotherapy Assessment  Analgesic: Oxycodone IR 10 mg, 1 tab PO q 6 hrs (40 mg/day of oxycodone) MME/day:60 mg/day.   Monitoring: Pharmacotherapy: No side-effects or adverse reactions reported. [place] PMP: PDMP reviewed during this encounter.       Compliance: No problems identified. Effectiveness: Clinically acceptable. Plan: Refer to "POC".  UDS:  Summary  Date Value Ref Range Status  03/03/2018 FINAL  Final    Comment:    ==================================================================== TOXASSURE SELECT 13 (MW) ==================================================================== Test                             Result       Flag       Units Drug Present and Declared for Prescription Verification   Oxycodone  1523         EXPECTED   ng/mg creat   Oxymorphone                     350          EXPECTED   ng/mg creat   Noroxycodone                   7255         EXPECTED   ng/mg creat    Sources of oxycodone include scheduled prescription medications.    Oxymorphone and noroxycodone are expected metabolites of    oxycodone. Oxymorphone is also available as a scheduled    prescription medication. Drug Absent but Declared for Prescription Verification   Diazepam                       Not Detected UNEXPECTED ng/mg creat ==================================================================== Test                      Result    Flag   Units      Ref Range   Creatinine              22               mg/dL      >=20 ==================================================================== Declared Medications:  The flagging and interpretation on this report are based on the  following declared medications.  Unexpected results may arise from  inaccuracies in the declared medications.  **Note: The testing scope of this panel includes these medications:  Diazepam  Oxycodone  **Note: The testing scope of this panel does not include following  reported medications:  Albuterol  Bupropion  Calcium carbonate (Calcium Carb/Vitamin D)  Cholecalciferol  Citalopram  Cyclobenzaprine  Epinephrine  Hydroxychloroquine  Ibuprofen  Levonorgestrel  Multivitamin  Potassium  Prednisone  Ranitidine  Thyroid: Liothyronine/Levothyroxine  Vitamin B (Biotin)  Vitamin D (Calcium Carb/Vitamin D) ==================================================================== For clinical consultation, please call [phone]. ====================================================================    Laboratory Chemistry Profile (12 mo)  Renal: 03/03/2018: BUN 6; BUN/Creatinine Ratio 10; Creatinine, Ser 0.63  Lab Results  Component Value Date   GFRAA 123 03/03/2018   GFRNONAA 106 03/03/2018   Hepatic: 03/03/2018: Albumin 4.6 Lab Results  Component Value Date   AST 21 03/03/2018   ALT  26 03/27/2015   Other: 03/03/2018: CRP <1; Sed Rate 2; Vitamin B-12 386 Note: Above Lab results reviewed.  Imaging  Last 90 days:  No results found.  Assessment  The primary encounter diagnosis was Chronic pain syndrome. Diagnoses of Chronic low back pain (Primary Area of Pain) (Bilateral) (R>L), Chronic knee pain (Secondary area of Pain) (Bilateral) (R>L), Chronic musculoskeletal pain, and Muscle spasm were also pertinent to this visit.  Plan of Care  I have discontinued Holly Spears's Oxycodone HCl and Oxycodone HCl. I have also changed her Oxycodone HCl. Additionally, I am having her start on Oxycodone HCl and Oxycodone HCl. Lastly, I am having her maintain her hydroxychloroquine, Nature-Throid, busPIRone, citalopram, Biotin, Vitamin D3, levonorgestrel, Multi-Vitamins, buPROPion, EPINEPHrine, diazepam, Calcium Carbonate-Vitamin D, potassium chloride SA, ibuprofen, famotidine, and cyclobenzaprine.  Pharmacotherapy (Medications Ordered): Meds ordered this encounter  Medications  . Oxycodone HCl 10 MG TABS    Sig: Take 1 tablet (10 mg total) by mouth every 6 (six) hours as needed. Must last 30 days    Dispense:  120 tablet    Refill:  0    Chronic  Pain: STOP Act (Not applicable) Fill 1 day early if closed on refill date. Do not fill until: 12/05/2018. To last until: 01/04/2019. Avoid benzodiazepines within 8 hours of opioids  . cyclobenzaprine (FLEXERIL) 10 MG tablet    Sig: Take 1 tablet (10 mg total) by mouth 3 (three) times daily.    Dispense:  270 tablet    Refill:  1    Fill one day early if pharmacy is closed on scheduled refill date. May substitute for generic if available.  . Oxycodone HCl 10 MG TABS    Sig: Take 1 tablet (10 mg total) by mouth every 6 (six) hours as needed. Must last 30 days    Dispense:  120 tablet    Refill:  0    Chronic Pain: STOP Act (Not applicable) Fill 1 day early if closed on refill date. Do not fill until: 01/04/2019. To last until: 02/03/2019. Avoid  benzodiazepines within 8 hours of opioids  . Oxycodone HCl 10 MG TABS    Sig: Take 1 tablet (10 mg total) by mouth every 6 (six) hours as needed. Must last 30 days    Dispense:  120 tablet    Refill:  0    Chronic Pain: STOP Act (Not applicable) Fill 1 day early if closed on refill date. Do not fill until: 02/03/2019. To last until: 03/05/2019. Avoid benzodiazepines within 8 hours of opioids   Orders:  No orders of the defined types were placed in this encounter.  Follow-up plan:   Return in about 3 months (around 03/01/2019) for (VV), (MM).      Interventional therapies: Planned, scheduled, and/or pending:   The patient apparently had a bad experience with another physician receiving blocks.    Considering:   Diagnostic bilateral lumbar facet block  Possible bilateral lumbar facet RFA  Diagnostic right-sided L5-S1 LESI  Possible right-sided L5-S1 vs S1 TFESI  Diagnostic bilateral intra-articular knee injections (w/ steroid)  Possible series of 5 IA Hyalgan knee injections  Diagnostic bilateral Genicular NB  Possible bilateral Genicular nerve RFA    Palliative PRN treatment(s):   Diagnostic bilateral lumbar facet block  Diagnostic right-sided L5-S1 LESI  Diagnostic bilateral IA knee injections (w/ steroid)  Diagnostic bilateral Genicular NB      Recent Visits Date Type Provider Dept  09/06/18 Office Visit Delano Metz, MD [facility]  Showing recent visits within past 90 days and meeting all other requirements   Today's Visits Date Type Provider Dept  11/28/18 Office Visit Delano Metz, MD [facility]  Showing today's visits and meeting all other requirements   Future Appointments No visits were found meeting these conditions.  Showing future appointments within next 90 days and meeting all other requirements   I discussed the assessment and treatment plan with the patient. The patient was provided an opportunity to ask questions and all were  answered. The patient agreed with the plan and demonstrated an understanding of the instructions.  Patient advised to call back or seek an in-person evaluation if the symptoms or condition worsens.  Total duration of non-face-to-face encounter: 12 minutes.  Note by: Oswaldo Done, MD Date: 11/28/2018; Time: 11:11 AM  Note: This dictation was prepared with Dragon dictation. Any transcriptional errors that may result from this process are unintentional.  Disclaimer:  * Given the special circumstances of the COVID-19 pandemic, the federal government has announced that the Office for Civil Rights (OCR) will exercise its enforcement discretion and will not impose penalties on physicians  using telehealth in the event of noncompliance with regulatory requirements under the [employer] and Accountability Act (HIPAA) in connection with the good faith provision of telehealth during the XX123456 national public health emergency. (AMA)

## 2018-11-28 ENCOUNTER — Other Ambulatory Visit: Payer: Self-pay

## 2018-11-28 ENCOUNTER — Ambulatory Visit: Payer: 59 | Attending: Pain Medicine | Admitting: Pain Medicine

## 2018-11-28 DIAGNOSIS — M62838 Other muscle spasm: Secondary | ICD-10-CM | POA: Diagnosis not present

## 2018-11-28 DIAGNOSIS — M5441 Lumbago with sciatica, right side: Secondary | ICD-10-CM | POA: Diagnosis not present

## 2018-11-28 DIAGNOSIS — M5442 Lumbago with sciatica, left side: Secondary | ICD-10-CM

## 2018-11-28 DIAGNOSIS — M25562 Pain in left knee: Secondary | ICD-10-CM | POA: Diagnosis not present

## 2018-11-28 DIAGNOSIS — M7918 Myalgia, other site: Secondary | ICD-10-CM | POA: Diagnosis not present

## 2018-11-28 DIAGNOSIS — M25561 Pain in right knee: Secondary | ICD-10-CM

## 2018-11-28 DIAGNOSIS — G8929 Other chronic pain: Secondary | ICD-10-CM

## 2018-11-28 DIAGNOSIS — G894 Chronic pain syndrome: Secondary | ICD-10-CM

## 2018-11-28 MED ORDER — OXYCODONE HCL 10 MG PO TABS: 10.0000 mg | ORAL_TABLET | Freq: Four times a day (QID) | ORAL | 0 refills | Status: DC | PRN

## 2018-11-28 MED ORDER — CYCLOBENZAPRINE HCL 10 MG PO TABS: 10.0000 mg | ORAL_TABLET | Freq: Three times a day (TID) | ORAL | 1 refills | Status: DC

## 2019-02-22 ENCOUNTER — Encounter: Payer: Self-pay | Admitting: Pain Medicine

## 2019-02-23 NOTE — Progress Notes (Signed)
Patient: Holly Spears  Service Category: E/M  Provider: Oswaldo Done, MD  DOB: [birth year]  DOS: 02/27/2019  Location: Office  MRN: [id number]  Setting: Ambulatory outpatient  Referring Provider: Charisse March, MD  Type: Established Patient  Specialty: Interventional Pain Management  PCP: Charisse March, MD  Location: Remote location  Delivery: TeleHealth     Virtual Encounter - Pain Management PROVIDER NOTE: Information contained herein reflects review and annotations entered in association with encounter. Interpretation of such information and data should be left to medically-trained personnel. Information provided to patient can be located elsewhere in the medical record under "Patient Instructions". Document created using STT-dictation technology, any transcriptional errors that may result from process are unintentional.    Contact & Pharmacy Preferred: [phone] Home: [phone] (home) Mobile: [phone] (mobile) E-mail: No e-mail address on record  [facility] [place], [place] [address] [place] [address] Phone: [phone] Fax: [phone]   Pre-screening  [name] offered "in-person" vs "virtual" encounter. She indicated preferring virtual for this encounter.   Reason COVID-19*  Social distancing based on CDC and AMA recommendations.   I contacted Mulki Turnbough on 02/27/2019 via telephone.      I clearly identified myself as Oswaldo Done, MD. I verified that I was speaking with the correct person using two identifiers (Name: Holly Spears, and date of birth: 27-Mar-1970).  Consent I sought verbal advanced consent from [facility] for virtual visit interactions. I informed Holly Spears of possible security and privacy concerns, risks, and limitations associated with providing "not-in-person" medical evaluation and management services. I also informed Holly Spears of the availability of "in-person" appointments. Finally,  I informed her that there would be a charge for the virtual visit and that she could be  personally, fully or partially, financially responsible for it. Ms. Sen expressed understanding and agreed to proceed.   Historic Elements   Holly Spears is a 49 y.o. year old, female patient evaluated today after her last encounter by our practice on 11/28/2018. Holly Spears  has a past medical history of Acid reflux (12/15/2011), Anxiety, generalized (12/15/2011), Arthritis, Cervical pain (12/15/2011), Depression, GERD (gastroesophageal reflux disease), Hiatal hernia, LBP (low back pain) (12/15/2011), Migraine, Muscle ache (12/15/2011), Pain (12/27/2012), Rheumatoid arthritis (HCC) (12/15/2011), and Scratched cornea (10/25/2015). She also  has a past surgical history that includes Cholecystectomy. [name] has a current medication list which includes the following prescription(s): biotin, buspirone, calcium carbonate-vitamin d, vitamin d3, citalopram, [START ON 03/05/2019] cyclobenzaprine, diazepam, epinephrine, famotidine, hydroxychloroquine, ibuprofen, levonorgestrel, multi-vitamins, nature-throid, [START ON 03/05/2019] oxycodone hcl, [START ON 04/04/2019] oxycodone hcl, [START ON 05/04/2019] oxycodone hcl, potassium chloride sa, and bupropion. She  reports that she has quit smoking. She has never used smokeless tobacco. She reports that she does not drink alcohol or use drugs. Holly Spears is allergic to bee venom; honey bee venom; penicillins; shellfish allergy; sulfa antibiotics; erythromycin; sulfamethizole; zaleplon; codeine; erythromycin base; and trazodone.   HPI  Today, she is being contacted for medication management. The patient indicates doing well with the current medication regimen. No adverse reactions or side effects reported to the medications.   Pharmacotherapy Assessment  Analgesic: Oxycodone IR 10 mg, 1 tab PO q 6 hrs (40 mg/day of oxycodone) MME/day:60 mg/day.   Monitoring: Pharmacotherapy:  No side-effects or adverse reactions reported. [place] PMP: PDMP reviewed during this encounter.       Compliance: No problems identified. Effectiveness: Clinically acceptable. Plan: Refer  to "POC".  UDS:  Summary  Date Value Ref Range Status  03/03/2018 FINAL  Final    Comment:    ==================================================================== TOXASSURE SELECT 13 (MW) ==================================================================== Test                             Result       Flag       Units Drug Present and Declared for Prescription Verification   Oxycodone                      1523         EXPECTED   ng/mg creat   Oxymorphone                    350          EXPECTED   ng/mg creat   Noroxycodone                   7255         EXPECTED   ng/mg creat    Sources of oxycodone include scheduled prescription medications.    Oxymorphone and noroxycodone are expected metabolites of    oxycodone. Oxymorphone is also available as a scheduled    prescription medication. Drug Absent but Declared for Prescription Verification   Diazepam                       Not Detected UNEXPECTED ng/mg creat ==================================================================== Test                      Result    Flag   Units      Ref Range   Creatinine              22               mg/dL      >=00 ==================================================================== Declared Medications:  The flagging and interpretation on this report are based on the  following declared medications.  Unexpected results may arise from  inaccuracies in the declared medications.  **Note: The testing scope of this panel includes these medications:  Diazepam  Oxycodone  **Note: The testing scope of this panel does not include following  reported medications:  Albuterol  Bupropion  Calcium carbonate (Calcium Carb/Vitamin D)  Cholecalciferol  Citalopram  Cyclobenzaprine  Epinephrine  Hydroxychloroquine  Ibuprofen   Levonorgestrel  Multivitamin  Potassium  Prednisone  Ranitidine  Thyroid: Liothyronine/Levothyroxine  Vitamin B (Biotin)  Vitamin D (Calcium Carb/Vitamin D) ==================================================================== For clinical consultation, please call [phone]. ====================================================================    Laboratory Chemistry Profile (12 mo)  Renal: 03/03/2018: BUN 6; BUN/Creatinine Ratio 10; Creatinine, Ser 0.63  Lab Results  Component Value Date   GFRAA 123 03/03/2018   GFRNONAA 106 03/03/2018   Hepatic: 03/03/2018: Albumin 4.6 Lab Results  Component Value Date   AST 21 03/03/2018   ALT 26 03/27/2015   Other: 03/03/2018: CRP <1; Sed Rate 2; Vitamin B-12 386  Note: Above Lab results reviewed.  Imaging  DG Lumbar Spine Complete W/Bend CLINICAL DATA:  Chronic pain, no injury Please comment in the report about spinal instability (>4 mm displacement), in addition to any: Spondylolisthesis (specify retro- or antero- and Grade level) (describe displacement in millimeters )  EXAM: LUMBAR SPINE - COMPLETE WITH BENDING VIEWS  COMPARISON:  None.  FINDINGS: There is mild degenerative change in the facet joints of  the lower lumbar spine. No acute fracture or subluxation. Surgical clips are noted in the right upper quadrant the abdomen. Intrauterine device overlies the central pelvis.  IMPRESSION: Mild lower lumbar degenerative changes.  No spondylolisthesis.  Electronically Signed   By: Norva Pavlov M.D.   On: 02/18/2017 15:47 DG Thoracic Spine 2 View CLINICAL DATA:  Chronic pain, no injury Please comment in the report about spinal instability (>4 mm displacement), in addition to any: Spondylolisthesis (specify retro- or antero- and Grade level) (describe displacement in millimeters);  EXAM: THORACIC SPINE 2 VIEWS  COMPARISON:  None.  FINDINGS: There is mild S shaped scoliosis of the thoracic spine not associated  with vertebral body anomalies. Mild degenerative changes are seen in the thoracic spine. No spondylolisthesis. Mild anterior wedge morphology noted at T11 is of indeterminate age. No acute fracture.  IMPRESSION: Mild midthoracic spondylosis.  No spondylolisthesis.  Mild anterior wedging of T11, age indeterminate.  Electronically Signed   By: Norva Pavlov M.D.   On: 02/18/2017 15:44   Assessment  The primary encounter diagnosis was Chronic pain syndrome. Diagnoses of Chronic low back pain (Primary Area of Pain) (Bilateral) (R>L), Chronic knee pain (Secondary area of Pain) (Bilateral) (R>L), Chronic musculoskeletal pain, and Muscle spasm were also pertinent to this visit.  Plan of Care  Problem-specific:  No problem-specific Assessment & Plan notes found for this encounter.  I am having Cheryn Cartaya start on Oxycodone HCl and Oxycodone HCl. I am also having her maintain her hydroxychloroquine, Nature-Throid, busPIRone, citalopram, Biotin, Vitamin D3, levonorgestrel, Multi-Vitamins, buPROPion, EPINEPHrine, diazepam, Calcium Carbonate-Vitamin D, potassium chloride SA, ibuprofen, famotidine, Oxycodone HCl, and cyclobenzaprine.  Pharmacotherapy (Medications Ordered): Meds ordered this encounter  Medications  . Oxycodone HCl 10 MG TABS    Sig: Take 1 tablet (10 mg total) by mouth every 6 (six) hours as needed. Must last 30 days    Dispense:  120 tablet    Refill:  0    Chronic Pain: STOP Act (Not applicable) Fill 1 day early if closed on refill date. Do not fill until: 03/05/2019. To last until: 04/04/2019. Avoid benzodiazepines within 8 hours of opioids  . Oxycodone HCl 10 MG TABS    Sig: Take 1 tablet (10 mg total) by mouth every 6 (six) hours as needed. Must last 30 days    Dispense:  120 tablet    Refill:  0    Chronic Pain: STOP Act (Not applicable) Fill 1 day early if closed on refill date. Do not fill until: 04/04/2019. To last until: 05/04/2019. Avoid benzodiazepines within 8 hours  of opioids  . Oxycodone HCl 10 MG TABS    Sig: Take 1 tablet (10 mg total) by mouth every 6 (six) hours as needed. Must last 30 days    Dispense:  120 tablet    Refill:  0    Chronic Pain: STOP Act (Not applicable) Fill 1 day early if closed on refill date. Do not fill until: 05/04/2019. To last until: 06/03/2019. Avoid benzodiazepines within 8 hours of opioids  . cyclobenzaprine (FLEXERIL) 10 MG tablet    Sig: Take 1 tablet (10 mg total) by mouth 3 (three) times daily.    Dispense:  90 tablet    Refill:  5    Fill one day early if pharmacy is closed on scheduled refill date. May substitute for generic if available.   Orders:  No orders of the defined types were placed in this encounter.  Follow-up plan:   Return in about  3 months (around 05/31/2019) for (VV), (MM).      Interventional therapies: Planned, scheduled, and/or pending:   The patient apparently had a bad experience with another physician receiving blocks.    Considering:   Diagnostic bilateral lumbar facet block  Possible bilateral lumbar facet RFA  Diagnostic right L5-S1 LESI  Possible right L5 vs S1 TFESI  Diagnostic bilateral IA knee injections (w/ steroid)  Possible series of 5 IA Hyalgan knee injections  Diagnostic bilateral Genicular NB  Possible bilateral Genicular nerve RFA    Palliative PRN treatment(s):   At this point, we have done no injections for this patient.    Recent Visits No visits were found meeting these conditions.  Showing recent visits within past 90 days and meeting all other requirements   Today's Visits Date Type Provider Dept  02/27/19 Telemedicine Milinda Pointer, MD [facility]  Showing today's visits and meeting all other requirements   Future Appointments No visits were found meeting these conditions.  Showing future appointments within next 90 days and meeting all other requirements   I discussed the assessment and treatment plan with the patient. The patient was  provided an opportunity to ask questions and all were answered. The patient agreed with the plan and demonstrated an understanding of the instructions.  Patient advised to call back or seek an in-person evaluation if the symptoms or condition worsens.  Duration of encounter: 13 minutes.  Note by: Gaspar Cola, MD Date: 02/27/2019; Time: 11:30 AM

## 2019-02-27 ENCOUNTER — Other Ambulatory Visit: Payer: Self-pay

## 2019-02-27 ENCOUNTER — Ambulatory Visit: Payer: 59 | Attending: Pain Medicine | Admitting: Pain Medicine

## 2019-02-27 DIAGNOSIS — M25561 Pain in right knee: Secondary | ICD-10-CM | POA: Diagnosis not present

## 2019-02-27 DIAGNOSIS — M62838 Other muscle spasm: Secondary | ICD-10-CM

## 2019-02-27 DIAGNOSIS — M7918 Myalgia, other site: Secondary | ICD-10-CM | POA: Diagnosis not present

## 2019-02-27 DIAGNOSIS — M545 Low back pain: Secondary | ICD-10-CM | POA: Diagnosis not present

## 2019-02-27 DIAGNOSIS — G8929 Other chronic pain: Secondary | ICD-10-CM

## 2019-02-27 DIAGNOSIS — G894 Chronic pain syndrome: Secondary | ICD-10-CM | POA: Diagnosis not present

## 2019-02-27 DIAGNOSIS — M25562 Pain in left knee: Secondary | ICD-10-CM

## 2019-02-27 MED ORDER — OXYCODONE HCL 10 MG PO TABS: 10.0000 mg | ORAL_TABLET | Freq: Four times a day (QID) | ORAL | 0 refills | Status: DC | PRN

## 2019-02-27 MED ORDER — CYCLOBENZAPRINE HCL 10 MG PO TABS: 10.0000 mg | ORAL_TABLET | Freq: Three times a day (TID) | ORAL | 5 refills | Status: DC

## 2019-05-04 ENCOUNTER — Encounter: Payer: Self-pay | Admitting: Pain Medicine

## 2019-05-07 NOTE — Progress Notes (Signed)
Patient: Holly Spears  Service Category: E/M  Provider: Gaspar Cola, MD  DOB: Jul 31, 1970  DOS: 05/08/2019  Location: Office  MRN: [id number]  Setting: Ambulatory outpatient  Referring Provider: Judene Companion, MD  Type: Established Patient  Specialty: Interventional Pain Management  PCP: Judene Companion, MD  Location: Remote location  Delivery: TeleHealth     Virtual Encounter - Pain Management PROVIDER NOTE: Information contained herein reflects review and annotations entered in association with encounter. Interpretation of such information and data should be left to medically-trained personnel. Information provided to patient can be located elsewhere in the medical record under "Patient Instructions". Document created using STT-dictation technology, any transcriptional errors that may result from process are unintentional.    Contact & Pharmacy Preferred: [phone] Home: [phone] (home) Mobile: [phone] (mobile) E-mail: No e-mail address on record  [place], [place] [address] [place] [address] Phone: [phone] Fax: [phone]   Pre-screening  Holly Spears offered "in-person" vs "virtual" encounter. She indicated preferring virtual for this encounter.   Reason COVID-19*  Social distancing based on CDC and AMA recommendations.   I contacted Holly Spears on 05/08/2019 via telephone.      I clearly identified myself as Gaspar Cola, MD. I verified that I was speaking with the correct person using two identifiers (Name: Holly Spears, and date of birth: 03-Feb-1970).  Consent I sought verbal advanced consent from [name] for virtual visit interactions. I informed Holly Spears of possible security and privacy concerns, risks, and limitations associated with providing "not-in-person" medical evaluation and management services. I also informed Holly Spears of the availability of "in-person" appointments. Finally, I  informed her that there would be a charge for the virtual visit and that she could be  personally, fully or partially, financially responsible for it. Holly Spears expressed understanding and agreed to proceed.   Historic Elements   Holly Spears is a 49 y.o. year old, female patient evaluated today after her last contact with our practice on 11/28/2018. 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 She also  has a past surgical history that includes Cholecystectomy. Holly Spears has a current medication list which includes the following prescription(s): biotin, buspirone, calcium carbonate-vitamin d, vitamin d3, citalopram, cyclobenzaprine, diazepam, epinephrine, famotidine, hydroxychloroquine, ibuprofen, levonorgestrel, multi-vitamins, nature-throid, oxycodone hcl, [START ON 06/03/2019] oxycodone hcl, [START ON 07/03/2019] oxycodone hcl, [START ON 08/02/2019] oxycodone hcl, potassium chloride sa, and bupropion. She  reports that she has quit smoking. She has never used smokeless tobacco. She reports that she does not drink alcohol or use drugs. Holly Spears is allergic to bee venom; penicillins; shellfish allergy; sulfa antibiotics; erythromycin; zaleplon; codeine; and trazodone.   HPI  Today, she is being contacted for medication management. The patient indicates doing well with the current medication regimen. No adverse reactions or side effects reported to the medications.  The patient indicates that in the future she is pending to have some dental work done and therefore she was concerned about they also productive acute pain management.  I have informed her that she can pick up one of our  handouts to give to the surgeons assisting them on how to manage the acute postop pain.  I also  reminded her that there are some responsibilities that she has, associated to getting any additional pain medicine after emergencies or surgeries.  He was reminded that she has to call and provide [place] with information regarding those prescriptions when this happens.  She understood and accepted.  She also had some questions regarding her Flexeril prescription indicating that she thought she had received a different amount home the last prescription.  I informed the patient that there should not be any differences in the Flexeril prescriptions since it was written in January.  I reminded her that that prescription should last for 6 months and it provides for her to have 1 pill p.o. 3 times daily #90 every 30 days.  Pharmacotherapy Assessment  Analgesic: Oxycodone IR 10 mg, 1 tab PO q 6 hrs (40 mg/day of oxycodone) MME/day:60 mg/day.   Monitoring: [place] PMP: PDMP reviewed during this encounter.       Pharmacotherapy: No side-effects or adverse reactions reported. Compliance: No problems identified. Effectiveness: Clinically acceptable. Plan: Refer to "POC".  UDS:  Summary  Date Value Ref Range Status  03/03/2018 FINAL  Final    Comment:    ==================================================================== TOXASSURE SELECT 13 (MW) ==================================================================== Test                             Result       Flag       Units Drug Present and Declared for Prescription Verification   Oxycodone                      1523         EXPECTED   ng/mg creat   Oxymorphone                    350          EXPECTED   ng/mg creat   Noroxycodone                   7255         EXPECTED   ng/mg creat    Sources of oxycodone include scheduled prescription medications.    Oxymorphone and noroxycodone are expected metabolites of    oxycodone. Oxymorphone is also available as a  scheduled    prescription medication. Drug Absent but Declared for Prescription Verification   Diazepam                       Not Detected UNEXPECTED ng/mg creat ==================================================================== Test                      Result    Flag   Units      Ref Range   Creatinine              22               mg/dL      >=20 ==================================================================== Declared Medications:  The flagging and interpretation on this report are based on the  following declared medications.  Unexpected results may arise from  inaccuracies in the declared medications.  **Note: The testing scope of this panel includes these medications:  Diazepam  Oxycodone  **Note: The testing scope of this panel does not include following  reported medications:  Albuterol  Bupropion  Calcium carbonate (Calcium Carb/Vitamin D)  Cholecalciferol  Citalopram  Cyclobenzaprine  Epinephrine  Hydroxychloroquine  Ibuprofen  Levonorgestrel  Multivitamin  Potassium  Prednisone  Ranitidine  Thyroid: Liothyronine/Levothyroxine  Vitamin B (Biotin)  Vitamin D (Calcium Carb/Vitamin D) ==================================================================== For clinical consultation, please call [phone]. ====================================================================    Laboratory Chemistry Profile   Renal Lab Results  Component Value Date   BUN 6 03/03/2018   CREATININE 0.63 03/03/2018   BCR 10 03/03/2018   GFRAA 123 03/03/2018   GFRNONAA 106 03/03/2018     Hepatic Lab Results  Component Value Date   AST 21 03/03/2018   ALT 26 03/27/2015   ALBUMIN 4.6 03/03/2018   ALKPHOS 71 03/03/2018     Electrolytes Lab Results  Component Value Date   NA 136 03/03/2018   K 3.9 03/03/2018   CL 97 03/03/2018   CALCIUM 8.9 03/03/2018   MG 2.1 03/03/2018     Bone Lab Results  Component Value Date   VD25OH 29.3 (L) 12/31/2014      Inflammation (CRP: Acute Phase) (ESR: Chronic Phase) Lab Results  Component Value Date   CRP <1 03/03/2018   ESRSEDRATE 2 03/03/2018       Note: Above Lab results reviewed.  Imaging  DG Lumbar Spine Complete W/Bend CLINICAL DATA:  Chronic pain, no injury Please comment in the report about spinal instability (>4 mm displacement), in addition to any: Spondylolisthesis (specify retro- or antero- and Grade level) (describe displacement in millimeters )  EXAM: LUMBAR SPINE - COMPLETE WITH BENDING VIEWS  COMPARISON:  None.  FINDINGS: There is mild degenerative change in the facet joints of the lower lumbar spine. No acute fracture or subluxation. Surgical clips are noted in the right upper quadrant the abdomen. Intrauterine device overlies the central pelvis.  IMPRESSION: Mild lower lumbar degenerative changes.  No spondylolisthesis.  Electronically Signed   By: Nolon Nations M.D.   On: 02/18/2017 15:47 DG Thoracic Spine 2 View CLINICAL DATA:  Chronic pain, no injury Please comment in the report about spinal instability (>4 mm displacement), in addition to any: Spondylolisthesis (specify retro- or antero- and Grade level) (describe displacement in millimeters);  EXAM: THORACIC SPINE 2 VIEWS  COMPARISON:  None.  FINDINGS: There is mild S shaped scoliosis of the thoracic spine not associated with vertebral body anomalies. Mild degenerative changes are seen in the thoracic spine. No spondylolisthesis. Mild anterior wedge morphology noted at T11 is of indeterminate age. No acute fracture.  IMPRESSION: Mild midthoracic spondylosis.  No spondylolisthesis.  Mild anterior wedging of T11, age indeterminate.  Electronically Signed   By: Nolon Nations M.D.   On: 02/18/2017 15:44  Assessment  The primary encounter diagnosis was Chronic low back pain (Primary Area of Pain) (Bilateral) (R>L). Diagnoses of Chronic pain syndrome and Chronic knee pain (Secondary area  of Pain) (Bilateral) (R>L) were also pertinent to this visit.  Plan of Care  Problem-specific:  No problem-specific Assessment & Plan notes found for this encounter.  Holly Spears has a current medication list which includes the following long-term medication(s): calcium carbonate-vitamin d, citalopram, cyclobenzaprine, famotidine, levonorgestrel, nature-throid, oxycodone hcl, [START ON 06/03/2019] oxycodone hcl, [START ON 07/03/2019] oxycodone hcl, [START ON 08/02/2019] oxycodone hcl, and bupropion.  Pharmacotherapy (Medications Ordered): Meds ordered this encounter  Medications  . Oxycodone HCl 10 MG TABS    Sig: Take 1 tablet (10 mg total) by mouth every 6 (six) hours as needed. Must last 30 days    Dispense:  120 tablet    Refill:  0    Chronic Pain: STOP Act (Not applicable) Fill 1 day early if closed  on refill date. Do not fill until: 06/03/2019. To last until: 07/03/2019. Avoid benzodiazepines within 8 hours of opioids  . Oxycodone HCl 10 MG TABS    Sig: Take 1 tablet (10 mg total) by mouth every 6 (six) hours as needed. Must last 30 days    Dispense:  120 tablet    Refill:  0    Chronic Pain: STOP Act (Not applicable) Fill 1 day early if closed on refill date. Do not fill until: 07/03/2019. To last until: 08/02/2019. Avoid benzodiazepines within 8 hours of opioids  . Oxycodone HCl 10 MG TABS    Sig: Take 1 tablet (10 mg total) by mouth every 6 (six) hours as needed. Must last 30 days    Dispense:  120 tablet    Refill:  0    Chronic Pain: STOP Act (Not applicable) Fill 1 day early if closed on refill date. Do not fill until: 08/02/2019. To last until: 09/01/2019. Avoid benzodiazepines within 8 hours of opioids   Orders:  No orders of the defined types were placed in this encounter.  Follow-up plan:   Return in about 4 months (around 08/30/2019) for (VV), (MM).      Interventional therapies: Planned, scheduled, and/or pending:   The patient apparently had a bad experience with  another physician receiving blocks.    Considering:   Diagnostic bilateral lumbar facet block  Possible bilateral lumbar facet RFA  Diagnostic right L5-S1 LESI  Possible right L5 vs S1 TFESI  Diagnostic bilateral IA knee injections (w/ steroid)  Possible series of 5 IA Hyalgan knee injections  Diagnostic bilateral Genicular NB  Possible bilateral Genicular nerve RFA    Palliative PRN treatment(s):   At this point, we have done no injections for this patient.     Recent Visits Date Type Provider Dept  02/27/19 Telemedicine Milinda Pointer, MD [facility]  Showing recent visits within past 90 days and meeting all other requirements   Today's Visits Date Type Provider Dept  05/08/19 Telemedicine Milinda Pointer, MD [facility]  Showing today's visits and meeting all other requirements   Future Appointments No visits were found meeting these conditions.  Showing future appointments within next 90 days and meeting all other requirements   I discussed the assessment and treatment plan with the patient. The patient was provided an opportunity to ask questions and all were answered. The patient agreed with the plan and demonstrated an understanding of the instructions.  Patient advised to call back or seek an in-person evaluation if the symptoms or condition worsens.  Duration of encounter: 15 minutes.  Note by: Gaspar Cola, MD Date: 05/08/2019; Time: 1:01 PM

## 2019-05-08 ENCOUNTER — Other Ambulatory Visit: Payer: Self-pay

## 2019-05-08 ENCOUNTER — Ambulatory Visit: Payer: 59 | Attending: Pain Medicine | Admitting: Pain Medicine

## 2019-05-08 DIAGNOSIS — G894 Chronic pain syndrome: Secondary | ICD-10-CM

## 2019-05-08 DIAGNOSIS — M5442 Lumbago with sciatica, left side: Secondary | ICD-10-CM

## 2019-05-08 DIAGNOSIS — M25562 Pain in left knee: Secondary | ICD-10-CM

## 2019-05-08 DIAGNOSIS — G8929 Other chronic pain: Secondary | ICD-10-CM

## 2019-05-08 DIAGNOSIS — M5441 Lumbago with sciatica, right side: Secondary | ICD-10-CM

## 2019-05-08 DIAGNOSIS — M25561 Pain in right knee: Secondary | ICD-10-CM | POA: Diagnosis not present

## 2019-05-08 MED ORDER — OXYCODONE HCL 10 MG PO TABS: 10.0000 mg | ORAL_TABLET | Freq: Four times a day (QID) | ORAL | 0 refills | Status: DC | PRN

## 2019-05-22 ENCOUNTER — Telehealth: Payer: Medicare Other | Admitting: Pain Medicine

## 2019-08-17 ENCOUNTER — Encounter: Payer: Self-pay | Admitting: Pain Medicine

## 2019-08-20 NOTE — Progress Notes (Signed)
Patient: Holly Spears  Service Category: E/M  Provider: Gaspar Cola, MD  DOB: [birth year]  DOS: 08/21/2019  Location: Office  MRN: [id number]  Setting: Ambulatory outpatient  Referring Provider: Judene Companion, MD  Type: Established Patient  Specialty: Interventional Pain Management  PCP: Judene Companion, MD  Location: Remote location  Delivery: TeleHealth     Virtual Encounter - Pain Management PROVIDER NOTE: Information contained herein reflects review and annotations entered in association with encounter. Interpretation of such information and data should be left to medically-trained personnel. Information provided to patient can be located elsewhere in the medical record under "Patient Instructions". Document created using STT-dictation technology, any transcriptional errors that may result from process are unintentional.    Contact & Pharmacy Preferred: [phone] Home: [phone] (home) Mobile: [phone] (mobile) E-mail: No e-mail address on record  [place], [place] [address] # [place] [address] Phone: [phone] Fax: [phone]   Pre-screening  Holly Spears offered "in-person" vs "virtual" encounter. She indicated preferring virtual for this encounter.   Reason COVID-19*  Social distancing based on CDC and AMA recommendations.   I contacted Holly Spears on 08/21/2019 via telephone.      I clearly identified myself as Gaspar Cola, MD. I verified that I was speaking with the correct person using two identifiers (Name: Holly Spears, and date of birth: 1970-06-29).  Consent I sought verbal advanced consent from Holly Spears for virtual visit interactions. I informed Holly Spears of possible security and privacy concerns, risks, and limitations associated with providing "not-in-person" medical evaluation and management services. I also informed Holly Spears of the availability of "in-person" appointments. Finally,  I informed her that there would be a charge for the virtual visit and that she could be  personally, fully or partially, financially responsible for it. Holly Spears expressed understanding and agreed to proceed.   Historic Elements   Holly Spears is a 49 y.o. year old, female patient evaluated today after her last contact with our practice on Visit date not found. 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 She also  has a past surgical history that includes Cholecystectomy. Holly Spears has a current medication list which includes the following prescription(s): biotin, buspirone, calcium carbonate-vitamin d, vitamin d3, citalopram, [START ON 09/01/2019] cyclobenzaprine, diazepam, epinephrine, famotidine, hydroxychloroquine, ibuprofen, levonorgestrel, multi-vitamins, nature-throid, [START ON 09/01/2019] oxycodone hcl, [START ON 10/01/2019] oxycodone hcl, [START ON 10/31/2019] oxycodone hcl, potassium chloride sa, and bupropion. She  reports that she has quit smoking. She has never used smokeless tobacco. She reports that she does not drink alcohol and does not use drugs. Holly Spears is allergic to bee venom, penicillins, shellfish allergy, sulfa antibiotics, erythromycin, zaleplon, codeine, and trazodone.   HPI  Today, she is being contacted for medication management.  Pharmacotherapy Assessment  Analgesic: Oxycodone IR 10 mg, 1 tab PO q 6 hrs (40 mg/day of oxycodone) MME/day:60 mg/day.   Monitoring: [place] PMP: PDMP reviewed during this encounter.       Pharmacotherapy: No side-effects or adverse reactions reported. Compliance: No problems identified. Effectiveness: Clinically  acceptable. Plan: Refer to "POC".  UDS:  Summary  Date Value Ref Range Status  03/03/2018 FINAL  Final    Comment:    ====================================================================  TOXASSURE SELECT 13 (MW) ==================================================================== Test                             Result       Flag       Units Drug Present and Declared for Prescription Verification   Oxycodone                      1523         EXPECTED   ng/mg creat   Oxymorphone                    350          EXPECTED   ng/mg creat   Noroxycodone                   7255         EXPECTED   ng/mg creat    Sources of oxycodone include scheduled prescription medications.    Oxymorphone and noroxycodone are expected metabolites of    oxycodone. Oxymorphone is also available as a scheduled    prescription medication. Drug Absent but Declared for Prescription Verification   Diazepam                       Not Detected UNEXPECTED ng/mg creat ==================================================================== Test                      Result    Flag   Units      Ref Range   Creatinine              22               mg/dL      >=20 ==================================================================== Declared Medications:  The flagging and interpretation on this report are based on the  following declared medications.  Unexpected results may arise from  inaccuracies in the declared medications.  **Note: The testing scope of this panel includes these medications:  Diazepam  Oxycodone  **Note: The testing scope of this panel does not include following  reported medications:  Albuterol  Bupropion  Calcium carbonate (Calcium Carb/Vitamin D)  Cholecalciferol  Citalopram  Cyclobenzaprine  Epinephrine  Hydroxychloroquine  Ibuprofen  Levonorgestrel  Multivitamin  Potassium  Prednisone  Ranitidine  Thyroid: Liothyronine/Levothyroxine  Vitamin B (Biotin)  Vitamin D (Calcium Carb/Vitamin  D) ==================================================================== For clinical consultation, please call [phone]. ====================================================================     Laboratory Chemistry Profile   Renal Lab Results  Component Value Date   BUN 6 03/03/2018   CREATININE 0.63 03/03/2018   BCR 10 03/03/2018   GFRAA 123 03/03/2018   GFRNONAA 106 03/03/2018     Hepatic Lab Results  Component Value Date   AST 21 03/03/2018   ALT 26 03/27/2015   ALBUMIN 4.6 03/03/2018   ALKPHOS 71 03/03/2018     Electrolytes Lab Results  Component Value Date   NA 136 03/03/2018   K 3.9 03/03/2018   CL 97 03/03/2018   CALCIUM 8.9 03/03/2018   MG 2.1 03/03/2018     Bone Lab Results  Component Value Date   VD25OH 29.3 (L) 12/31/2014     Inflammation (CRP: Acute Phase) (ESR: Chronic Phase) Lab Results  Component Value Date   CRP <1 03/03/2018   ESRSEDRATE 2 03/03/2018       Note: Above Lab results reviewed.   Imaging  DG Lumbar  Spine Complete W/Bend CLINICAL DATA:  Chronic pain, no injury Please comment in the report about spinal instability (>4 mm displacement), in addition to any: Spondylolisthesis (specify retro- or antero- and Grade level) (describe displacement in millimeters )  EXAM: LUMBAR SPINE - COMPLETE WITH BENDING VIEWS  COMPARISON:  None.  FINDINGS: There is mild degenerative change in the facet joints of the lower lumbar spine. No acute fracture or subluxation. Surgical clips are noted in the right upper quadrant the abdomen. Intrauterine device overlies the central pelvis.  IMPRESSION: Mild lower lumbar degenerative changes.  No spondylolisthesis.  Electronically Signed   By: Nolon Nations M.D.   On: 02/18/2017 15:47 DG Thoracic Spine 2 View CLINICAL DATA:  Chronic pain, no injury Please comment in the report about spinal instability (>4 mm displacement), in addition to any: Spondylolisthesis (specify retro- or  antero- and Grade level) (describe displacement in millimeters);  EXAM: THORACIC SPINE 2 VIEWS  COMPARISON:  None.  FINDINGS: There is mild S shaped scoliosis of the thoracic spine not associated with vertebral body anomalies. Mild degenerative changes are seen in the thoracic spine. No spondylolisthesis. Mild anterior wedge morphology noted at T11 is of indeterminate age. No acute fracture.  IMPRESSION: Mild midthoracic spondylosis.  No spondylolisthesis.  Mild anterior wedging of T11, age indeterminate.  Electronically Signed   By: Nolon Nations M.D.   On: 02/18/2017 15:44  Assessment  The primary encounter diagnosis was Chronic pain syndrome. Diagnoses of Chronic low back pain (Primary Area of Pain) (Bilateral) (R>L), Chronic knee pain (Secondary area of Pain) (Bilateral) (R>L), Pharmacologic therapy, Chronic musculoskeletal pain, and Muscle spasm were also pertinent to this visit.  Plan of Care  Problem-specific:  No problem-specific Assessment & Plan notes found for this encounter.  Holly Spears has a current medication list which includes the following long-term medication(s): calcium carbonate-vitamin d, citalopram, [START ON 09/01/2019] cyclobenzaprine, famotidine, levonorgestrel, nature-throid, [START ON 09/01/2019] oxycodone hcl, [START ON 10/01/2019] oxycodone hcl, [START ON 10/31/2019] oxycodone hcl, and bupropion.  Pharmacotherapy (Medications Ordered): Meds ordered this encounter  Medications  . Oxycodone HCl 10 MG TABS    Sig: Take 1 tablet (10 mg total) by mouth every 6 (six) hours as needed. Must last 30 days    Dispense:  120 tablet    Refill:  0    Chronic Pain: STOP Act (Not applicable) Fill 1 day early if closed on refill date. Do not fill until: 09/01/2019. To last until: 10/01/2019. Avoid benzodiazepines within 8 hours of opioids  . cyclobenzaprine (FLEXERIL) 10 MG tablet    Sig: Take 1 tablet (10 mg total) by mouth 3 (three) times daily.     Dispense:  90 tablet    Refill:  5    Fill one day early if pharmacy is closed on scheduled refill date. May substitute for generic if available.  . Oxycodone HCl 10 MG TABS    Sig: Take 1 tablet (10 mg total) by mouth every 6 (six) hours as needed. Must last 30 days    Dispense:  120 tablet    Refill:  0    Chronic Pain: STOP Act (Not applicable) Fill 1 day early if closed on refill date. Do not fill until: 10/01/2019. To last until: 10/31/2019. Avoid benzodiazepines within 8 hours of opioids  . Oxycodone HCl 10 MG TABS    Sig: Take 1 tablet (10 mg total) by mouth every 6 (six) hours as needed. Must last 30 days    Dispense:  120 tablet  Refill:  0    Chronic Pain: STOP Act (Not applicable) Fill 1 day early if closed on refill date. Do not fill until: 10/31/2019. To last until: 11/30/2019. Avoid benzodiazepines within 8 hours of opioids   Orders:  Orders Placed This Encounter  Procedures  . ToxASSURE Select 13 (MW), Urine    Volume: 30 ml(s). Minimum 3 ml of urine is needed. Document temperature of fresh sample. Indications: Long term (current) use of opiate analgesic [phone])    Order Specific Question:   Release to patient    Answer:   Immediate   Follow-up plan:   Return in about 3 months (around 11/29/2019) for F2F(20-min), MM (never on procedure day) to evaluate UDS.      Interventional therapies: Planned, scheduled, and/or pending:   The patient apparently had a bad experience with another physician receiving blocks.    Considering:   Diagnostic bilateral lumbar facet block  Possible bilateral lumbar facet RFA  Diagnostic right L5-S1 LESI  Possible right L5 vs S1 TFESI  Diagnostic bilateral IA knee injections (w/ steroid)  Possible series of 5 IA Hyalgan knee injections  Diagnostic bilateral Genicular NB  Possible bilateral Genicular nerve RFA    Palliative PRN treatment(s):   At this point, we have done no injections for this patient.      Recent Visits No visits  were found meeting these conditions. Showing recent visits within past 90 days and meeting all other requirements Today's Visits Date Type Provider Dept  08/21/19 Telemedicine Milinda Pointer, MD [facility]  Showing today's visits and meeting all other requirements Future Appointments No visits were found meeting these conditions. Showing future appointments within next 90 days and meeting all other requirements  I discussed the assessment and treatment plan with the patient. The patient was provided an opportunity to ask questions and all were answered. The patient agreed with the plan and demonstrated an understanding of the instructions.  Patient advised to call back or seek an in-person evaluation if the symptoms or condition worsens.  Duration of encounter: 13 minutes.  Note by: Gaspar Cola, MD Date: 08/21/2019; Time: 12:52 PM

## 2019-08-21 ENCOUNTER — Ambulatory Visit: Payer: 59 | Attending: Pain Medicine | Admitting: Pain Medicine

## 2019-08-21 ENCOUNTER — Other Ambulatory Visit: Payer: Self-pay

## 2019-08-21 DIAGNOSIS — M5442 Lumbago with sciatica, left side: Secondary | ICD-10-CM

## 2019-08-21 DIAGNOSIS — G894 Chronic pain syndrome: Secondary | ICD-10-CM | POA: Diagnosis not present

## 2019-08-21 DIAGNOSIS — Z79899 Other long term (current) drug therapy: Secondary | ICD-10-CM

## 2019-08-21 DIAGNOSIS — M25562 Pain in left knee: Secondary | ICD-10-CM

## 2019-08-21 DIAGNOSIS — M62838 Other muscle spasm: Secondary | ICD-10-CM

## 2019-08-21 DIAGNOSIS — M25561 Pain in right knee: Secondary | ICD-10-CM | POA: Diagnosis not present

## 2019-08-21 DIAGNOSIS — G8929 Other chronic pain: Secondary | ICD-10-CM

## 2019-08-21 DIAGNOSIS — M7918 Myalgia, other site: Secondary | ICD-10-CM

## 2019-08-21 DIAGNOSIS — M5441 Lumbago with sciatica, right side: Secondary | ICD-10-CM

## 2019-08-21 MED ORDER — OXYCODONE HCL 10 MG PO TABS: 10.0000 mg | ORAL_TABLET | Freq: Four times a day (QID) | ORAL | 0 refills | Status: DC | PRN

## 2019-08-21 MED ORDER — CYCLOBENZAPRINE HCL 10 MG PO TABS: 10.0000 mg | ORAL_TABLET | Freq: Three times a day (TID) | ORAL | 5 refills | Status: DC

## 2019-08-29 LAB — TOXASSURE SELECT 13 (MW), URINE

## 2019-11-21 NOTE — Progress Notes (Signed)
PROVIDER NOTE: Information contained herein reflects review and annotations entered in association with encounter. Interpretation of such information and data should be left to medically-trained personnel. Information provided to patient can be located elsewhere in the medical record under "Patient Instructions". Document created using STT-dictation technology, any transcriptional errors that may result from process are unintentional.    Patient: Holly Spears  Service Category: E/M  Provider: Gaspar Cola, MD  DOB: [birth year]  DOS: 11/22/2019  Specialty: Interventional Pain Management  MRN: [id number]  Setting: Ambulatory outpatient  PCP: Judene Companion, MD  Type: Established Patient    Referring Provider: Judene Companion, MD  Location: Office  Delivery: Face-to-face     HPI  Holly Spears, a 49 y.o. year old female, is here today because of her Chronic pain syndrome [G89.4]. Ms. Baiz's primary complain today is Back Pain (mid) Last encounter: My last encounter with her was on Visit date not found. Pertinent problems: 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; Lumbar spondylosis (L3-4, L4-5, and L5-S1); Lumbar bulging disc (L3-4, L4-5, and L5-S1); Chronic lumbar radicular pain (Bilateral) (R>L); Chronic knee pain (Secondary area of Pain) (Bilateral) (R>L); Chronic upper back pain (Left); Thoracic disc herniation (Large, Left paracentral T10-11 disc herniation); Thoracic spinal stenosis  (Severe, Left, T10-11 Lateral Recess Stenosis); Thoracic foraminal stenosis (Severe Left T10-11); History of bilateral carpal tunnel release (Bilateral); Chronic hand pain (Bilateral) (R>L); Muscle weakness; Pain in limb; Neck pain; and Spasm of back muscles on their pertinent problem list. Pain Assessment: Severity of Chronic pain is reported as a 2 /10. Location: Back Mid/in the back. Onset: More than a month ago. Quality: Aching. Timing: Intermittent. Modifying factor(s): heat. Vitals:  height is 5' 6"  (1.676 m) and weight is 150 lb (68 kg). Her temperature is 97.1 F (36.2 C) (abnormal). Her blood pressure is 135/92 (abnormal) and her pulse is 103 (abnormal). Her respiration is 18 and oxygen saturation is 100%.   Reason for encounter: medication management.  The patient indicates doing well with the current medication regimen. No adverse reactions or side effects reported to the medications.  RTCB: 02/28/2020 Nonopioids transfer 11/22/2019: Flexeril  Pharmacotherapy Assessment   Analgesic: Oxycodone IR 10 mg, 1 tab PO q 6 hrs (40 mg/day of oxycodone) MME/day:60 mg/day.   Monitoring: [place] PMP: PDMP reviewed during this encounter.       Pharmacotherapy: No side-effects or adverse reactions reported. Compliance: No problems identified. Effectiveness: Clinically acceptable.  Dewayne Shorter, RN  11/22/2019 10:30 AM  Signed Nursing Pain Medication Assessment:  Safety precautions to be maintained throughout the outpatient stay will include: orient to surroundings, keep bed in low position, maintain call bell within reach at all times, provide assistance with transfer out of bed and ambulation.  Medication Inspection Compliance: Pill count conducted under aseptic conditions, in front of the patient. Neither the pills nor the bottle was removed from the patient's sight at any time. Once count was completed pills were immediately returned to the patient in their original bottle.  Medication: Oxycodone  IR Pill/Patch Count: 34 of 120 pills remain Pill/Patch Appearance: Markings consistent with prescribed medication Bottle  Appearance: Standard pharmacy container. Clearly labeled. Filled Date: 09 / 28/ 2021 Last Medication intake:  Today    UDS:  Summary  Date Value Ref Range Status  08/24/2019 Note  Final    Comment:    ==================================================================== [employer] [facility] 13 (MW) ==================================================================== Specimen Alert Note: Urinary creatinine is low; ability to detect some drugs may be compromised. Interpret results with caution. (Creatinine) ==================================================================== Test                             Result       Flag       Units  Drug Present and Declared for Prescription Verification   Oxycodone                      658          EXPECTED   ng/mg creat   Noroxycodone                   3308         EXPECTED   ng/mg creat    Sources of oxycodone include scheduled prescription medications.    Noroxycodone is an expected metabolite of oxycodone.  Drug Absent but Declared for Prescription Verification   Diazepam                       Not Detected UNEXPECTED ng/mg creat ==================================================================== Test                      Result    Flag   Units      Ref Range   Creatinine              12        LL     mg/dL      >=20 ==================================================================== Declared Medications:  The flagging and interpretation on this report are based on the  following declared medications.  Unexpected results may arise from  inaccuracies in the declared medications.   **Note: The testing scope of this panel includes these medications:   Diazepam (Valium)  Oxycodone   **Note: The testing scope of this panel does not include the  following reported medications:   Biotin  Bupropion (Wellbutrin)   Buspirone (Buspar)  Calcium  Cholecalciferol  Citalopram (Celexa)  Cyclobenzaprine (Flexeril)  Epinephrine  Famotidine  Hydroxychloroquine (Plaquenil)  Ibuprofen  Levonorgestrel  Levothyroxine  Liothyronine  Multivitamin  Potassium (Klor-Con)  Vitamin D ==================================================================== For clinical consultation, please call [phone]. ====================================================================      ROS  Constitutional: Denies any fever or chills Gastrointestinal: No reported hemesis, hematochezia, vomiting, or acute GI distress Musculoskeletal: Denies any acute onset joint swelling, redness, loss of ROM, or weakness Neurological: No reported episodes of acute onset apraxia, aphasia, dysarthria, agnosia, amnesia, paralysis, loss of coordination, or loss of consciousness  Medication Review  Biotin, Calcium Carbonate-Vitamin D, EPINEPHrine, Multi-Vitamins, Oxycodone HCl, Sodium Fluoride, Thyroid, Vitamin D3, buPROPion, busPIRone, citalopram, cyclobenzaprine, diazepam, famotidine, hydroxychloroquine, ibuprofen, levonorgestrel, naloxone, and potassium chloride SA  History Review  Allergy: Ms. Sterbenz is allergic to bee venom, penicillins, shellfish allergy, sulfa antibiotics, erythromycin, zaleplon, codeine, and trazodone. Drug: Ms. Godina  reports no history of drug use. Alcohol:  reports no history of alcohol use. Tobacco:  reports that she has quit smoking. She has never used smokeless tobacco. Social: Ms. Braggs  reports that she has quit smoking. She has never used smokeless  tobacco. She reports that she does not drink alcohol and does not use drugs. Medical:  has a past medical history of Acid reflux (12/15/2011), Anxiety, generalized (12/15/2011), Arthritis, Cervical pain (12/15/2011), Depression, GERD (gastroesophageal reflux disease), Hiatal hernia, LBP (low back pain) (12/15/2011), Migraine, Muscle ache (12/15/2011), Pain  (12/27/2012), Rheumatoid arthritis ([place]) (12/15/2011), and Scratched cornea (10/25/2015). Surgical: Ms. Degracia  has a past surgical history that includes Cholecystectomy. Family: family history includes Depression in her mother; Diabetes in her father; Hyperlipidemia in her mother; Hypertension in her father and mother.  Laboratory Chemistry Profile   Renal Lab Results  Component Value Date   BUN 6 03/03/2018   CREATININE 0.63 03/03/2018   BCR 10 03/03/2018   GFRAA 123 03/03/2018   GFRNONAA 106 03/03/2018     Hepatic Lab Results  Component Value Date   AST 21 03/03/2018   ALT 26 03/27/2015   ALBUMIN 4.6 03/03/2018   ALKPHOS 71 03/03/2018     Electrolytes Lab Results  Component Value Date   NA 136 03/03/2018   K 3.9 03/03/2018   CL 97 03/03/2018   CALCIUM 8.9 03/03/2018   MG 2.1 03/03/2018     Bone Lab Results  Component Value Date   VD25OH 29.3 (L) 12/31/2014     Inflammation (CRP: Acute Phase) (ESR: Chronic Phase) Lab Results  Component Value Date   CRP <1 03/03/2018   ESRSEDRATE 2 03/03/2018       Note: Above Lab results reviewed.  Recent Imaging Review  DG Lumbar Spine Complete W/Bend CLINICAL DATA:  Chronic pain, no injury Please comment in the report about spinal instability (>4 mm displacement), in addition to any: Spondylolisthesis (specify retro- or antero- and Grade level) (describe displacement in millimeters )  EXAM: LUMBAR SPINE - COMPLETE WITH BENDING VIEWS  COMPARISON:  None.  FINDINGS: There is mild degenerative change in the facet joints of the lower lumbar spine. No acute fracture or subluxation. Surgical clips are noted in the right upper quadrant the abdomen. Intrauterine device overlies the central pelvis.  IMPRESSION: Mild lower lumbar degenerative changes.  No spondylolisthesis.  Electronically Signed   By: Nolon Nations M.D.   On: 02/18/2017 15:47 DG Thoracic Spine 2 View CLINICAL DATA:  Chronic pain, no injury  Please comment in the report about spinal instability (>4 mm displacement), in addition to any: Spondylolisthesis (specify retro- or antero- and Grade level) (describe displacement in millimeters);  EXAM: THORACIC SPINE 2 VIEWS  COMPARISON:  None.  FINDINGS: There is mild S shaped scoliosis of the thoracic spine not associated with vertebral body anomalies. Mild degenerative changes are seen in the thoracic spine. No spondylolisthesis. Mild anterior wedge morphology noted at T11 is of indeterminate age. No acute fracture.  IMPRESSION: Mild midthoracic spondylosis.  No spondylolisthesis.  Mild anterior wedging of T11, age indeterminate.  Electronically Signed   By: Nolon Nations M.D.   On: 02/18/2017 15:44 Note: Reviewed        Physical Exam  General appearance: Well nourished, well developed, and well hydrated. In no apparent acute distress Mental status: Alert, oriented x 3 (person, place, & time)       Respiratory: No evidence of acute respiratory distress Eyes: PERLA Vitals: BP (!) 135/92   Pulse (!) 103   Temp (!) 97.1 F (36.2 C)   Resp 18   Ht 5' 6"  (1.676 m)   Wt 150 lb (68 kg)   SpO2 100%   BMI 24.21 kg/m  BMI: Estimated body mass index is  24.21 kg/m as calculated from the following:   Height as of this encounter: 5' 6"  (1.676 m).   Weight as of this encounter: 150 lb (68 kg). Ideal: Ideal body weight: 59.3 kg (130 lb 11.7 oz) Adjusted ideal body weight: 62.8 kg (138 lb 7 oz)  Assessment   Status Diagnosis  Controlled Controlled Controlled 1. Chronic pain syndrome   2. Chronic low back pain (Primary Area of Pain) (Bilateral) (R>L)   3. Chronic knee pain (Secondary area of Pain) (Bilateral) (R>L)   4. Pharmacologic therapy   5. Chronic musculoskeletal pain   6. Muscle spasm   7. Opiate use (60 MME/Day)      Updated Problems: No problems updated.  Plan of Care  Problem-specific:  No problem-specific Assessment & Plan notes found for this  encounter.  Ms. Makell Drohan has a current medication list which includes the following long-term medication(s): bupropion, calcium carbonate-vitamin d, citalopram, famotidine, levonorgestrel, nature-throid, [START ON 11/30/2019] cyclobenzaprine, [START ON 11/30/2019] oxycodone hcl, [START ON 12/30/2019] oxycodone hcl, and [START ON 01/29/2020] oxycodone hcl.  Pharmacotherapy (Medications Ordered): Meds ordered this encounter  Medications  . cyclobenzaprine (FLEXERIL) 10 MG tablet    Sig: Take 1 tablet (10 mg total) by mouth 3 (three) times daily.    Dispense:  90 tablet    Refill:  2    Fill one day early if pharmacy is closed on scheduled refill date. May substitute for generic if available.  . Oxycodone HCl 10 MG TABS    Sig: Take 1 tablet (10 mg total) by mouth every 6 (six) hours as needed. Must last 30 days    Dispense:  120 tablet    Refill:  0    Chronic Pain: STOP Act (Not applicable) Fill 1 day early if closed on refill date. Avoid benzodiazepines within 8 hours of opioids  . naloxone (NARCAN) 2 MG/2ML injection    Sig: Inject 1 mL (1 mg total) into the muscle as needed for up to 2 doses (for opioid overdose). In case of emergency (overdose), inject into muscle of upper arm or leg and call 911.    Dispense:  2 mL    Refill:  0    Please instruct patient on the emergency use of this medication.  . Oxycodone HCl 10 MG TABS    Sig: Take 1 tablet (10 mg total) by mouth every 6 (six) hours as needed. Must last 30 days    Dispense:  120 tablet    Refill:  0    Chronic Pain: STOP Act (Not applicable) Fill 1 day early if closed on refill date. Avoid benzodiazepines within 8 hours of opioids  . Oxycodone HCl 10 MG TABS    Sig: Take 1 tablet (10 mg total) by mouth every 6 (six) hours as needed. Must last 30 days    Dispense:  120 tablet    Refill:  0    Chronic Pain: STOP Act (Not applicable) Fill 1 day early if closed on refill date. Avoid benzodiazepines within 8 hours of opioids    Orders:  No orders of the defined types were placed in this encounter.  Follow-up plan:   Return in about 14 weeks (around 02/28/2020) for (F2F), (Med Mgmt).      Interventional therapies: Planned, scheduled, and/or pending:   The patient apparently had a bad experience with another physician receiving blocks.    Considering:   Diagnostic bilateral lumbar facet block  Possible bilateral lumbar facet RFA  Diagnostic right L5-S1  LESI  Possible right L5 vs S1 TFESI  Diagnostic bilateral IA knee injections (w/ steroid)  Possible series of 5 IA Hyalgan knee injections  Diagnostic bilateral Genicular NB  Possible bilateral Genicular nerve RFA    Palliative PRN treatment(s):   At this point, we have done no injections for this patient.       Recent Visits No visits were found meeting these conditions. Showing recent visits within past 90 days and meeting all other requirements Today's Visits Date Type Provider Dept  11/22/19 Office Visit Milinda Pointer, MD [facility]  Showing today's visits and meeting all other requirements Future Appointments No visits were found meeting these conditions. Showing future appointments within next 90 days and meeting all other requirements  I discussed the assessment and treatment plan with the patient. The patient was provided an opportunity to ask questions and all were answered. The patient agreed with the plan and demonstrated an understanding of the instructions.  Patient advised to call back or seek an in-person evaluation if the symptoms or condition worsens.  Duration of encounter: 30 minutes.  Note by: Gaspar Cola, MD Date: 11/22/2019; Time: 10:52 AM

## 2019-11-22 ENCOUNTER — Encounter: Payer: Self-pay | Admitting: Pain Medicine

## 2019-11-22 ENCOUNTER — Ambulatory Visit: Payer: 59 | Attending: Pain Medicine | Admitting: Pain Medicine

## 2019-11-22 ENCOUNTER — Other Ambulatory Visit: Payer: Self-pay

## 2019-11-22 VITALS — BP 135/92 | HR 103 | Temp 97.1°F | Resp 18 | Ht 66.0 in | Wt 150.0 lb

## 2019-11-22 DIAGNOSIS — M25561 Pain in right knee: Secondary | ICD-10-CM | POA: Diagnosis present

## 2019-11-22 DIAGNOSIS — Z79899 Other long term (current) drug therapy: Secondary | ICD-10-CM

## 2019-11-22 DIAGNOSIS — F1190 Opioid use, unspecified, uncomplicated: Secondary | ICD-10-CM

## 2019-11-22 DIAGNOSIS — G8929 Other chronic pain: Secondary | ICD-10-CM

## 2019-11-22 DIAGNOSIS — M7918 Myalgia, other site: Secondary | ICD-10-CM | POA: Insufficient documentation

## 2019-11-22 DIAGNOSIS — G894 Chronic pain syndrome: Secondary | ICD-10-CM | POA: Diagnosis present

## 2019-11-22 DIAGNOSIS — M5441 Lumbago with sciatica, right side: Secondary | ICD-10-CM

## 2019-11-22 DIAGNOSIS — M62838 Other muscle spasm: Secondary | ICD-10-CM

## 2019-11-22 DIAGNOSIS — M5442 Lumbago with sciatica, left side: Secondary | ICD-10-CM

## 2019-11-22 DIAGNOSIS — M25562 Pain in left knee: Secondary | ICD-10-CM | POA: Diagnosis present

## 2019-11-22 MED ORDER — CYCLOBENZAPRINE HCL 10 MG PO TABS: 10.0000 mg | ORAL_TABLET | Freq: Three times a day (TID) | ORAL | 2 refills | Status: AC

## 2019-11-22 MED ORDER — OXYCODONE HCL 10 MG PO TABS: 10.0000 mg | ORAL_TABLET | Freq: Four times a day (QID) | ORAL | 0 refills | Status: DC | PRN

## 2019-11-22 MED ORDER — NALOXONE HCL 2 MG/2ML IJ SOSY: 1.0000 mg | PREFILLED_SYRINGE | INTRAMUSCULAR | 0 refills | Status: DC | PRN

## 2019-11-22 NOTE — Progress Notes (Signed)
Nursing Pain Medication Assessment:  Safety precautions to be maintained throughout the outpatient stay will include: orient to surroundings, keep bed in low position, maintain call bell within reach at all times, provide assistance with transfer out of bed and ambulation.  Medication Inspection Compliance: Pill count conducted under aseptic conditions, in front of the patient. Neither the pills nor the bottle was removed from the patient's sight at any time. Once count was completed pills were immediately returned to the patient in their original bottle.  Medication: Oxycodone IR Pill/Patch Count: 34 of 120 pills remain Pill/Patch Appearance: Markings consistent with prescribed medication Bottle Appearance: Standard pharmacy container. Clearly labeled. Filled Date: 09 / 28/ 2021 Last Medication intake:  Today

## 2019-11-22 NOTE — Patient Instructions (Signed)
____________________________________________________________________________________________  CBD (cannabidiol) WARNING  Applicable to: All individuals currently taking or considering taking CBD (cannabidiol) and, more important, all patients taking opioid analgesic controlled substances (pain medication). (Example: oxycodone; oxymorphone; hydrocodone; hydromorphone; morphine; methadone; tramadol; tapentadol; fentanyl; buprenorphine; butorphanol; dextromethorphan; meperidine; codeine; etc.)  Legal status: CBD remains a Schedule I drug prohibited for any use. CBD is illegal with one exception. In the [place], CBD has a limited Food and Drug Administration (FDA) approval for the treatment of two specific types of epilepsy disorders. Only one CBD product has been approved by the FDA for this purpose: "Epidiolex". FDA is aware that some companies are marketing products containing cannabis and cannabis-derived compounds in ways that violate the Federal Food, Drug and Cosmetic Act (FD&C Act) and that may put the health and safety of consumers at risk. The FDA, a Federal agency, has not enforced the CBD status since 2018.   Legality: Some manufacturers ship CBD products nationally, which is illegal. Often such products are sold online and are therefore available throughout the country. CBD is openly sold in head shops and health food stores in some states where such sales have not been explicitly legalized. Selling unapproved products with unsubstantiated therapeutic claims is not only a violation of the law, but also can put patients at risk, as these products have not been proven to be safe or effective. Federal illegality makes it difficult to conduct research on CBD.  Reference: "FDA Regulation of Cannabis and Cannabis-Derived Products, Including Cannabidiol (CBD)" -  https://www.fda.gov/news-events/public-health-focus/fda-regulation-cannabis-and-cannabis-derived-products-including-cannabidiol-cbd  Warning: CBD is not FDA approved and has not undergo the same manufacturing controls as prescription drugs.  This means that the purity and safety of available CBD may be questionable. Most of the time, despite manufacturer's claims, it is contaminated with THC (delta-9-tetrahydrocannabinol - the chemical in marijuana responsible for the "HIGH").  When this is the case, the THC contaminant will trigger a positive urine drug screen (UDS) test for Marijuana (carboxy-THC). Because a positive UDS for any illicit substance is a violation of our medication agreement, your opioid analgesics (pain medicine) may be permanently discontinued.  MORE ABOUT CBD  General Information: CBD  is a derivative of the Marijuana (cannabis sativa) plant discovered in 1940. It is one of the 113 identified substances found in Marijuana. It accounts for up to 40% of the plant's extract. As of 2018, preliminary clinical studies on CBD included research for the treatment of anxiety, movement disorders, and pain. CBD is available and consumed in multiple forms, including inhalation of smoke or vapor, as an aerosol spray, and by mouth. It may be supplied as an oil containing CBD, capsules, dried cannabis, or as a liquid solution. CBD is thought not to be as psychoactive as THC (delta-9-tetrahydrocannabinol - the chemical in marijuana responsible for the "HIGH"). Studies suggest that CBD may interact with different biological target receptors in the body, including cannabinoid and other neurotransmitter receptors. As of 2018 the mechanism of action for its biological effects has not been determined.  Side-effects  Adverse reactions: Dry mouth, diarrhea, decreased appetite, fatigue, drowsiness, malaise, weakness, sleep disturbances, and others.  Drug interactions: CBC may interact with other medications  such as blood-thinners. (Last update: 09/09/2019) ____________________________________________________________________________________________   ____________________________________________________________________________________________  Medication Rules  Purpose: To inform patients, and their family members, of our rules and regulations.  Applies to: All patients receiving prescriptions (written or electronic).  Pharmacy of record: Pharmacy where electronic prescriptions will be sent. If written prescriptions are taken to a different pharmacy, please inform   the nursing staff. The pharmacy listed in the electronic medical record should be the one where you would like electronic prescriptions to be sent.  Electronic prescriptions: In compliance with the [place] Strengthen Opioid Misuse Prevention (STOP) Act of 2017 (Session Law 2017-74/H243), effective February 02, 2018, all controlled substances must be electronically prescribed. Calling prescriptions to the pharmacy will cease to exist.  Prescription refills: Only during scheduled appointments. Applies to all prescriptions.  NOTE: The following applies primarily to controlled substances (Opioid* Pain Medications).   Type of encounter (visit): For patients receiving controlled substances, face-to-face visits are required. (Not an option or up to the patient.)  Patient's responsibilities: 1. Pain Pills: Bring all pain pills to every appointment (except for procedure appointments). 2. Pill Bottles: Bring pills in original pharmacy bottle. Always bring the newest bottle. Bring bottle, even if empty. 3. Medication refills: You are responsible for knowing and keeping track of what medications you take and those you need refilled. The day before your appointment: write a list of all prescriptions that need to be refilled. The day of the appointment: give the list to the admitting nurse. Prescriptions will be written only during  appointments. No prescriptions will be written on procedure days. If you forget a medication: it will not be "Called in", "Faxed", or "electronically sent". You will need to get another appointment to get these prescribed. No early refills. Do not call asking to have your prescription filled early. 4. Prescription Accuracy: You are responsible for carefully inspecting your prescriptions before leaving our office. Have the discharge nurse carefully go over each prescription with you, before taking them home. Make sure that your name is accurately spelled, that your address is correct. Check the name and dose of your medication to make sure it is accurate. Check the number of pills, and the written instructions to make sure they are clear and accurate. Make sure that you are given enough medication to last until your next medication refill appointment. 5. Taking Medication: Take medication as prescribed. When it comes to controlled substances, taking less pills or less frequently than prescribed is permitted and encouraged. Never take more pills than instructed. Never take medication more frequently than prescribed.  6. Inform other Doctors: Always inform, all of your healthcare providers, of all the medications you take. 7. Pain Medication from other Providers: You are not allowed to accept any additional pain medication from any other Doctor or Healthcare provider. There are two exceptions to this rule. (see below) In the event that you require additional pain medication, you are responsible for notifying us, as stated below. 8. Medication Agreement: You are responsible for carefully reading and following our Medication Agreement. This must be signed before receiving any prescriptions from our practice. Safely store a copy of your signed Agreement. Violations to the Agreement will result in no further prescriptions. (Additional copies of our Medication Agreement are available upon request.) 9. Laws, Rules,  & Regulations: All patients are expected to follow all Federal and State Laws, Statutes, Rules, & Regulations. Ignorance of the Laws does not constitute a valid excuse.  10. Illegal drugs and Controlled Substances: The use of illegal substances (including, but not limited to marijuana and its derivatives) and/or the illegal use of any controlled substances is strictly prohibited. Violation of this rule may result in the immediate and permanent discontinuation of any and all prescriptions being written by our practice. The use of any illegal substances is prohibited. 11. Adopted CDC guidelines & recommendations: Target dosing   levels will be at or below 60 MME/day. Use of benzodiazepines** is not recommended.  Exceptions: There are only two exceptions to the rule of not receiving pain medications from other Healthcare Providers. 1. Exception #1 (Emergencies): In the event of an emergency (i.e.: accident requiring emergency care), you are allowed to receive additional pain medication. However, you are responsible for: As soon as you are able, call our office [phone], at any time of the day or night, and leave a message stating your name, the date and nature of the emergency, and the name and dose of the medication prescribed. In the event that your call is answered by a member of our staff, make sure to document and save the date, time, and the name of the person that took your information.  2. Exception #2 (Planned Surgery): In the event that you are scheduled by another doctor or dentist to have any type of surgery or procedure, you are allowed (for a period no longer than 30 days), to receive additional pain medication, for the acute post-op pain. However, in this case, you are responsible for picking up a copy of our "Post-op Pain Management for Surgeons" handout, and giving it to your surgeon or dentist. This document is available at our office, and does not require an appointment to obtain it. Simply  go to our office during business hours (Monday-Thursday from 8:00 AM to 4:00 PM) (Friday 8:00 AM to 12:00 Noon) or if you have a scheduled appointment with us, prior to your surgery, and ask for it by name. In addition, you are responsible for: calling our office [phone], at any time of the day or night, and leaving a message stating your name, name of your surgeon, type of surgery, and date of procedure or surgery. Failure to comply with your responsibilities may result in termination of therapy involving the controlled substances.  *Opioid medications include: morphine, codeine, oxycodone, oxymorphone, hydrocodone, hydromorphone, meperidine, tramadol, tapentadol, buprenorphine, fentanyl, methadone. **Benzodiazepine medications include: diazepam (Valium), alprazolam (Xanax), clonazepam (Klonopine), lorazepam (Ativan), clorazepate (Tranxene), chlordiazepoxide (Librium), estazolam (Prosom), oxazepam (Serax), temazepam (Restoril), triazolam (Halcion) (Last updated: 10/10/2019) ____________________________________________________________________________________________   ____________________________________________________________________________________________  Medication Recommendations and Reminders  Applies to: All patients receiving prescriptions (written and/or electronic).  Medication Rules & Regulations: These rules and regulations exist for your safety and that of others. They are not flexible and neither are we. Dismissing or ignoring them will be considered "non-compliance" with medication therapy, resulting in complete and irreversible termination of such therapy. (See document titled "Medication Rules" for more details.) In all conscience, because of safety reasons, we cannot continue providing a therapy where the patient does not follow instructions.  Pharmacy of record:   Definition: This is the pharmacy where your electronic prescriptions will be sent.   We do not endorse any  particular pharmacy, however, we have experienced problems with [facility] not securing enough medication supply for the community.  We do not restrict you in your choice of pharmacy. However, once we write for your prescriptions, we will NOT be re-sending more prescriptions to fix restricted supply problems created by your pharmacy, or your insurance.   The pharmacy listed in the electronic medical record should be the one where you want electronic prescriptions to be sent.  If you choose to change pharmacy, simply notify our nursing staff.  Recommendations:  Keep all of your pain medications in a safe place, under lock and key, even if you live alone. We will NOT replace lost, stolen, or   damaged medication.  After you fill your prescription, take 1 week's worth of pills and put them away in a safe place. You should keep a separate, properly labeled bottle for this purpose. The remainder should be kept in the original bottle. Use this as your primary supply, until it runs out. Once it's gone, then you know that you have 1 week's worth of medicine, and it is time to come in for a prescription refill. If you do this correctly, it is unlikely that you will ever run out of medicine.  To make sure that the above recommendation works, it is very important that you make sure your medication refill appointments are scheduled at least 1 week before you run out of medicine. To do this in an effective manner, make sure that you do not leave the office without scheduling your next medication management appointment. Always ask the nursing staff to show you in your prescription , when your medication will be running out. Then arrange for the receptionist to get you a return appointment, at least 7 days before you run out of medicine. Do not wait until you have 1 or 2 pills left, to come in. This is very poor planning and does not take into consideration that we may need to cancel appointments due to bad weather,  sickness, or emergencies affecting our staff.  DO NOT ACCEPT A "Partial Fill": If for any reason your pharmacy does not have enough pills/tablets to completely fill or refill your prescription, do not allow for a "partial fill". The law allows the pharmacy to complete that prescription within 72 hours, without requiring a new prescription. If they do not fill the rest of your prescription within those 72 hours, you will need a separate prescription to fill the remaining amount, which we will NOT provide. If the reason for the partial fill is your insurance, you will need to talk to the pharmacist about payment alternatives for the remaining tablets, but again, DO NOT ACCEPT A PARTIAL FILL, unless you can trust your pharmacist to obtain the remainder of the pills within 72 hours.  Prescription refills and/or changes in medication(s):   Prescription refills, and/or changes in dose or medication, will be conducted only during scheduled medication management appointments. (Applies to both, written and electronic prescriptions.)  No refills on procedure days. No medication will be changed or started on procedure days. No changes, adjustments, and/or refills will be conducted on a procedure day. Doing so will interfere with the diagnostic portion of the procedure.  No phone refills. No medications will be "called into the pharmacy".  No Fax refills.  No weekend refills.  No Holliday refills.  No after hours refills.  Remember:  Business hours are:  Monday to Thursday 8:00 AM to 4:00 PM Provider's Schedule: Janeal Abadi, MD - Appointments are:  Medication management: Monday and Wednesday 8:00 AM to 4:00 PM Procedure day: Tuesday and Thursday 7:30 AM to 4:00 PM Bilal Lateef, MD - Appointments are:  Medication management: Tuesday and Thursday 8:00 AM to 4:00 PM Procedure day: Monday and Wednesday 7:30 AM to 4:00 PM (Last update:  08/23/2019) ____________________________________________________________________________________________    

## 2019-12-11 ENCOUNTER — Telehealth: Payer: Self-pay | Admitting: Pain Medicine

## 2019-12-11 NOTE — Telephone Encounter (Signed)
Pharmacy states they cannot fill Narcan injection, they can fill the nasal spray if you want to resend the script.

## 2019-12-27 ENCOUNTER — Encounter: Payer: Self-pay | Admitting: Pain Medicine

## 2020-01-21 LAB — COLOGUARD

## 2020-02-12 LAB — COLOGUARD: COLOGUARD: NEGATIVE

## 2020-02-18 NOTE — Progress Notes (Signed)
Patient: Holly Spears  Service Category: E/M  Provider: Gaspar Cola, MD  DOB: 03-Jul-1970  DOS: 02/19/2020  Location: Office  MRN: [id number]  Setting: Ambulatory outpatient  Referring Provider: Judene Companion, MD  Type: Established Patient  Specialty: Interventional Pain Management  PCP: Judene Companion, MD  Location: Remote location  Delivery: TeleHealth     Virtual Encounter - Pain Management PROVIDER NOTE: Information contained herein reflects review and annotations entered in association with encounter. Interpretation of such information and data should be left to medically-trained personnel. Information provided to patient can be located elsewhere in the medical record under "Patient Instructions". Document created using STT-dictation technology, any transcriptional errors that may result from process are unintentional.    Contact & Pharmacy Preferred: [phone] Home: [phone] (home) Mobile: [phone] (mobile) E-mail: No e-mail address on record  [place], [place] [address] # [place] [address] Phone: [phone] Fax: [phone]   Pre-screening  [name] offered "in-person" vs "virtual" encounter. She indicated preferring virtual for this encounter.   Reason COVID-19*  Social distancing based on CDC and AMA recommendations.   I contacted Holly Spears on 02/19/2020 via telephone.      I clearly identified myself as Gaspar Cola, MD. I verified that I was speaking with the correct person using two identifiers (Name: Holly Spears, and date of birth: [birth year]).  Consent I sought verbal advanced consent from [facility] for virtual visit interactions. I informed Holly Spears of possible security and privacy concerns, risks, and limitations associated with providing "not-in-person" medical evaluation and management services. I also informed Holly Spears of the availability of "in-person" appointments. Finally,  I informed her that there would be a charge for the virtual visit and that she could be  personally, fully or partially, financially responsible for it. Holly Spears expressed understanding and agreed to proceed.   Historic Elements   Holly Spears is a 50 y.o. year old, female patient evaluated today after our last contact on 12/11/2019. 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 She also  has a past surgical history that includes Cholecystectomy. Holly Spears has a current medication list which includes the following prescription(s): biotin, calcium carbonate-vitamin d, vitamin d3, citalopram, cyclobenzaprine, epinephrine, famotidine, hydroxychloroquine, ibuprofen, levonorgestrel, multi-vitamins, [START ON 02/18/2021] naloxone, nature-throid, norethindrone, potassium chloride sa, prevident 5000 booster plus, bupropion, and [START ON 02/28/2020] oxycodone hcl. She  reports that she has quit smoking. She has never used smokeless tobacco. She reports that she does not drink alcohol and does not use drugs. Holly Spears is allergic to bee venom, penicillins, shellfish allergy, sulfa antibiotics, erythromycin, zaleplon, codeine, and trazodone.   HPI  Today, she is being contacted for medication management.  The patient indicates doing well with the current medication regimen. No adverse reactions or side effects reported to the medications.  According to the patient, she has been having problems with the pharmacy where they have not obtained injectable Narcan and continue to push on the nasal spray.  I have informed the patient that I will send a prescription for that, but I do not believe that  the nasal spray is optimal.  I have explained to the patient's the reasons for that and she  indicated that she would be calling around to see if she can find a pharmacy that can obtain the injectable Narcan.  Meanwhile I will be sending the nasal prescription.  RTCB: 03/29/2020 Nonopioids transferred 11/22/2019: Flexeril  Pharmacotherapy Assessment  Analgesic: Oxycodone IR 10 mg, 1 tab PO q 6 hrs (40 mg/day of oxycodone) MME/day:60 mg/day.   Monitoring: [place] PMP: PDMP reviewed during this encounter.       Pharmacotherapy: No side-effects or adverse reactions reported. Compliance: No problems identified. Effectiveness: Clinically acceptable. Plan: Refer to "POC".  UDS:  Summary  Date Value Ref Range Status  08/24/2019 Note  Final    Comment:    ==================================================================== [employer] Select 13 (MW) ==================================================================== Specimen Alert Note: Urinary creatinine is low; ability to detect some drugs may be compromised. Interpret results with caution. (Creatinine) ==================================================================== Test                             Result       Flag       Units  Drug Present and Declared for Prescription Verification   Oxycodone                      658          EXPECTED   ng/mg creat   Noroxycodone                   3308         EXPECTED   ng/mg creat    Sources of oxycodone include scheduled prescription medications.    Noroxycodone is an expected metabolite of oxycodone.  Drug Absent but Declared for Prescription Verification   Diazepam                       Not Detected UNEXPECTED ng/mg creat ==================================================================== Test                      Result    Flag   Units      Ref Range   Creatinine              12        LL     mg/dL      >=20 ==================================================================== Declared  Medications:  The flagging and interpretation on this report are based on the  following declared medications.  Unexpected results may arise from  inaccuracies in the declared medications.   **Note: The testing scope of this panel includes these medications:   Diazepam (Valium)  Oxycodone   **Note: The testing scope of this panel does not include the  following reported medications:   Biotin  Bupropion (Wellbutrin)  Buspirone (Buspar)  Calcium  Cholecalciferol  Citalopram (Celexa)  Cyclobenzaprine (Flexeril)  Epinephrine  Famotidine  Hydroxychloroquine (Plaquenil)  Ibuprofen  Levonorgestrel  Levothyroxine  Liothyronine  Multivitamin  Potassium (Klor-Con)  Vitamin D ==================================================================== For clinical consultation, please call [phone]. ====================================================================     Laboratory Chemistry Profile   Renal Lab Results  Component Value Date   BUN 6 03/03/2018   CREATININE 0.63 03/03/2018   BCR 10 03/03/2018   GFRAA 123 03/03/2018   GFRNONAA 106 03/03/2018     Hepatic Lab Results  Component Value Date   AST 21 03/03/2018   ALT 26 03/27/2015   ALBUMIN 4.6 03/03/2018   ALKPHOS 71 03/03/2018     Electrolytes Lab Results  Component Value Date  NA 136 03/03/2018   K 3.9 03/03/2018   CL 97 03/03/2018   CALCIUM 8.9 03/03/2018   MG 2.1 03/03/2018     Bone Lab Results  Component Value Date   VD25OH 29.3 (L) 12/31/2014     Inflammation (CRP: Acute Phase) (ESR: Chronic Phase) Lab Results  Component Value Date   CRP <1 03/03/2018   ESRSEDRATE 2 03/03/2018       Note: Above Lab results reviewed.  Imaging  DG Lumbar Spine Complete W/Bend CLINICAL DATA:  Chronic pain, no injury Please comment in the report about spinal instability (>4 mm displacement), in addition to any: Spondylolisthesis (specify retro- or antero- and Grade level) (describe displacement in  millimeters )  EXAM: LUMBAR SPINE - COMPLETE WITH BENDING VIEWS  COMPARISON:  None.  FINDINGS: There is mild degenerative change in the facet joints of the lower lumbar spine. No acute fracture or subluxation. Surgical clips are noted in the right upper quadrant the abdomen. Intrauterine device overlies the central pelvis.  IMPRESSION: Mild lower lumbar degenerative changes.  No spondylolisthesis.  Electronically Signed   By: Nolon Nations M.D.   On: 02/18/2017 15:47 DG Thoracic Spine 2 View CLINICAL DATA:  Chronic pain, no injury Please comment in the report about spinal instability (>4 mm displacement), in addition to any: Spondylolisthesis (specify retro- or antero- and Grade level) (describe displacement in millimeters);  EXAM: THORACIC SPINE 2 VIEWS  COMPARISON:  None.  FINDINGS: There is mild S shaped scoliosis of the thoracic spine not associated with vertebral body anomalies. Mild degenerative changes are seen in the thoracic spine. No spondylolisthesis. Mild anterior wedge morphology noted at T11 is of indeterminate age. No acute fracture.  IMPRESSION: Mild midthoracic spondylosis.  No spondylolisthesis.  Mild anterior wedging of T11, age indeterminate.  Electronically Signed   By: Nolon Nations M.D.   On: 02/18/2017 15:44  Assessment  The primary encounter diagnosis was Chronic pain syndrome. Diagnoses of Chronic low back pain (Primary Area of Pain) (Bilateral) (R>L), Chronic knee pain (Secondary area of Pain) (Bilateral) (R>L), Pharmacologic therapy, Uncomplicated opioid dependence ([place]), and Pain management were also pertinent to this visit.  Plan of Care  Problem-specific:  No problem-specific Assessment & Plan notes found for this encounter.  Ms. Zafira Munos has a current medication list which includes the following long-term medication(s): calcium carbonate-vitamin d, citalopram, cyclobenzaprine, famotidine, levonorgestrel, [START ON  02/18/2021] naloxone, nature-throid, norethindrone, bupropion, and [START ON 02/28/2020] oxycodone hcl.  Pharmacotherapy (Medications Ordered): Meds ordered this encounter  Medications  . Oxycodone HCl 10 MG TABS    Sig: Take 1 tablet (10 mg total) by mouth every 6 (six) hours as needed. Must last 30 days    Dispense:  120 tablet    Refill:  0    Chronic Pain: STOP Act (Not applicable) Fill 1 day early if closed on refill date. Avoid benzodiazepines within 8 hours of opioids  . naloxone (NARCAN) nasal spray 4 mg/0.1 mL    Sig: Place 1 spray into the nose once for 1 dose. Spray once into each nostril. If no response within 3 minutes, repeat application.    Dispense:  1 each    Refill:  0    Instruct patient in proper use of device.   Orders:  No orders of the defined types were placed in this encounter.  Follow-up plan:   Return in about 6 weeks (around 03/29/2020) for (F2F), (Med Mgmt).      Interventional therapies: Planned, scheduled, and/or pending:  The patient apparently had a bad experience with another physician receiving blocks.    Considering:   Diagnostic bilateral lumbar facet block  Possible bilateral lumbar facet RFA  Diagnostic right L5-S1 LESI  Possible right L5 vs S1 TFESI  Diagnostic bilateral IA knee injections (w/ steroid)  Possible series of 5 IA Hyalgan knee injections  Diagnostic bilateral Genicular NB  Possible bilateral Genicular nerve RFA    Palliative PRN treatment(s):   At this point, we have done no injections for this patient.        Recent Visits Date Type Provider Dept  11/22/19 Office Visit Milinda Pointer, MD [facility]  Showing recent visits within past 90 days and meeting all other requirements Today's Visits Date Type Provider Dept  02/19/20 Telemedicine Milinda Pointer, MD [facility]  Showing today's visits and meeting all other requirements Future Appointments No visits were found meeting these  conditions. Showing future appointments within next 90 days and meeting all other requirements  I discussed the assessment and treatment plan with the patient. The patient was provided an opportunity to ask questions and all were answered. The patient agreed with the plan and demonstrated an understanding of the instructions.  Patient advised to call back or seek an in-person evaluation if the symptoms or condition worsens.  Duration of encounter: 15 minutes.  Note by: Gaspar Cola, MD Date: 02/19/2020; Time: 9:50 AM

## 2020-02-19 ENCOUNTER — Telehealth: Payer: Self-pay

## 2020-02-19 ENCOUNTER — Ambulatory Visit: Payer: 59 | Attending: Pain Medicine | Admitting: Pain Medicine

## 2020-02-19 ENCOUNTER — Other Ambulatory Visit: Payer: Self-pay

## 2020-02-19 DIAGNOSIS — G8929 Other chronic pain: Secondary | ICD-10-CM

## 2020-02-19 DIAGNOSIS — M25561 Pain in right knee: Secondary | ICD-10-CM

## 2020-02-19 DIAGNOSIS — M25562 Pain in left knee: Secondary | ICD-10-CM

## 2020-02-19 DIAGNOSIS — M5442 Lumbago with sciatica, left side: Secondary | ICD-10-CM | POA: Diagnosis not present

## 2020-02-19 DIAGNOSIS — G894 Chronic pain syndrome: Secondary | ICD-10-CM

## 2020-02-19 DIAGNOSIS — R52 Pain, unspecified: Secondary | ICD-10-CM

## 2020-02-19 DIAGNOSIS — Z79899 Other long term (current) drug therapy: Secondary | ICD-10-CM

## 2020-02-19 DIAGNOSIS — M5441 Lumbago with sciatica, right side: Secondary | ICD-10-CM

## 2020-02-19 DIAGNOSIS — F1120 Opioid dependence, uncomplicated: Secondary | ICD-10-CM | POA: Insufficient documentation

## 2020-02-19 MED ORDER — NALOXONE HCL 4 MG/0.1ML NA LIQD: 1.0000 | Freq: Once | NASAL | 0 refills | Status: DC

## 2020-02-19 MED ORDER — OXYCODONE HCL 10 MG PO TABS: 10.0000 mg | ORAL_TABLET | Freq: Four times a day (QID) | ORAL | 0 refills | Status: DC | PRN

## 2020-02-19 NOTE — Telephone Encounter (Signed)
Holly Spears, Dr Laban Emperor sent me a message stating that her PCP is not correct.  Can you please fix this.

## 2020-02-20 ENCOUNTER — Other Ambulatory Visit: Payer: Self-pay

## 2020-03-24 NOTE — Progress Notes (Signed)
PROVIDER NOTE: Information contained herein reflects review and annotations entered in association with encounter. Interpretation of such information and data should be left to medically-trained personnel. Information provided to patient can be located elsewhere in the medical record under "Patient Instructions". Document created using STT-dictation technology, any transcriptional errors that may result from process are unintentional.    Patient: Holly Spears  Service Category: E/M  Provider: Gaspar Cola, MD  DOB: 01/07/1971  DOS: 03/25/2020  Specialty: Interventional Pain Management  MRN: [id number]  Setting: Ambulatory outpatient  PCP: Lonzo Cloud, PA-C  Type: Established Patient    Referring Provider: Judene Companion, MD  Location: Office  Delivery: Face-to-face     HPI  Holly Spears, a 50 y.o. year old female, is here today because of her Chronic pain syndrome [G89.4]. Ms. Helinski's primary complain today is Back Pain (low) Last encounter: My last encounter with her was on 12/11/2019. Pertinent problems: Ms. 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; Lumbar spondylosis (L3-4, L4-5, and L5-S1); Lumbar bulging disc (L3-4, L4-5, and L5-S1); Chronic lumbar radicular pain (Bilateral) (R>L); Chronic knee pain (Secondary area of Pain) (Bilateral) (R>L); Chronic upper back pain (Left); Thoracic disc herniation (Large, Left paracentral T10-11 disc herniation); Thoracic spinal stenosis (Severe, Left,  T10-11 Lateral Recess Stenosis); Thoracic foraminal stenosis (Severe Left T10-11); History of bilateral carpal tunnel release (Bilateral); Chronic hand pain (Bilateral) (R>L); Muscle weakness; Pain in limb; Neck pain; Spasm of back muscles; and Myalgia and myositis on their pertinent problem list. Pain Assessment: Severity of Chronic pain is reported as a 2 /10. Location: Back Lower/radiates down the back of right leg to heel;. Onset: More than a month ago. Quality: Aching. Timing: Constant. Modifying factor(s): nothing. Vitals:  height is 5' 6"  (1.676 m) and weight is 150 lb (68 kg). Her temperature is 97.3 F (36.3 C) (abnormal). Her blood pressure is 152/92 (abnormal) and her pulse is 100. Her respiration is 18 and oxygen saturation is 100%.   Reason for encounter: medication management.   The patient indicates doing well with the current medication regimen. No adverse reactions or side effects reported to the medications.  Today the patient had some questions with regards to a regional area of osteopenia found on the left thigh.  She had some questions as to whether or not this had to do with her steroid treatments.  I informed the patient that it would be a lot more appropriate if she had this conversation with the physician prescribing the steroids.  However, she indicated that she brought this up due to the fact that I was the first 1 was alerted her to some of the possible side effects of steroids.  This conversation was mainly as I was explaining to her some of her alternatives and the possible use of steroid injections.  Today I reminded the patient that she should have this looked into a little further since more likely than not, a regional area of osteopenia may be associated with a different reason as opposed to the osteopenia caused by oral steroids, which may be more generalized.  The patient indicated that this made  sense to her.  I also pointed out that my concerns regarding the side effects of  medications also extended to the opioids and their ability to mimic normal sex hormones and how this can lead to activation of the feedback mechanism in our bodies, thereby leading to further decrease in our sex hormones and all the consequences the are associated to that, such as osteopenia and demineralization.  She understood and accepted.  She was asked if she was interested in continuing with her therapy as this and she indicated that it is working and that she rather not change anything.  RTCB: 06/27/2020 Nonopioids transferred 11/22/2019: Flexeril  Pharmacotherapy Assessment   Analgesic: Oxycodone IR 10 mg, 1 tab PO q 6 hrs (40 mg/day of oxycodone) MME/day:60 mg/day.   Monitoring: [place] PMP: PDMP reviewed during this encounter.       Pharmacotherapy: No side-effects or adverse reactions reported. Compliance: No problems identified. Effectiveness: Clinically acceptable.  Dewayne Shorter, RN  03/25/2020  8:10 AM  Signed Nursing Pain Medication Assessment:  Safety precautions to be maintained throughout the outpatient stay will include: orient to surroundings, keep bed in low position, maintain call bell within reach at all times, provide assistance with transfer out of bed and ambulation.  Medication Inspection Compliance: Pill count conducted under aseptic conditions, in front of the patient. Neither the pills nor the bottle was removed from the patient's sight at any time. Once count was completed pills were immediately returned to the patient in their original bottle.  Medication: Oxycodone IR Pill/Patch Count: 18 of 120 pills remain Pill/Patch Appearance: Markings consistent with prescribed medication Bottle Appearance: Standard pharmacy container. Clearly labeled. Filled Date:02/28/2020 Last Medication intake:  Today    UDS:  Summary  Date Value Ref Range Status  08/24/2019 Note  Final    Comment:    ==================================================================== [employer]  Select 13 (MW) ==================================================================== Specimen Alert Note: Urinary creatinine is low; ability to detect some drugs may be compromised. Interpret results with caution. (Creatinine) ==================================================================== Test                             Result       Flag       Units  Drug Present and Declared for Prescription Verification   Oxycodone                      658          EXPECTED   ng/mg creat   Noroxycodone                   3308         EXPECTED   ng/mg creat    Sources of oxycodone include scheduled prescription medications.    Noroxycodone is an expected metabolite of oxycodone.  Drug Absent but Declared for Prescription Verification   Diazepam                       Not Detected UNEXPECTED ng/mg creat ==================================================================== Test                      Result    Flag   Units      Ref Range   Creatinine              12        LL     mg/dL      >=  20 ==================================================================== Declared Medications:  The flagging and interpretation on this report are based on the  following declared medications.  Unexpected results may arise from  inaccuracies in the declared medications.   **Note: The testing scope of this panel includes these medications:   Diazepam (Valium)  Oxycodone   **Note: The testing scope of this panel does not include the  following reported medications:   Biotin  Bupropion (Wellbutrin)  Buspirone (Buspar)  Calcium  Cholecalciferol  Citalopram (Celexa)  Cyclobenzaprine (Flexeril)  Epinephrine  Famotidine  Hydroxychloroquine (Plaquenil)  Ibuprofen  Levonorgestrel  Levothyroxine  Liothyronine  Multivitamin  Potassium (Klor-Con)  Vitamin D ==================================================================== For clinical consultation, please call [phone]. ====================================================================      ROS  Constitutional: Denies any fever or chills Gastrointestinal: No reported hemesis, hematochezia, vomiting, or acute GI distress Musculoskeletal: Denies any acute onset joint swelling, redness, loss of ROM, or weakness Neurological: No reported episodes of acute onset apraxia, aphasia, dysarthria, agnosia, amnesia, paralysis, loss of coordination, or loss of consciousness  Medication Review  Biotin, Calcium Carbonate-Vitamin D, EPINEPHrine, Multi-Vitamins, Oxycodone HCl, Sodium Fluoride, Thyroid, Vitamin D3, buPROPion, citalopram, cyclobenzaprine, famotidine, hydroxychloroquine, ibuprofen, levonorgestrel, levothyroxine, naloxone, norethindrone, and potassium chloride SA  History Review  Allergy: Ms. Auvil is allergic to bee venom, penicillins, shellfish allergy, sulfa antibiotics, erythromycin, zaleplon, codeine, and trazodone. Drug: Ms. Deluna  reports no history of drug use. Alcohol:  reports no history of alcohol use. Tobacco:  reports that she has quit smoking. She has never used smokeless tobacco. Social: Ms. Thackston  reports that she has quit smoking. She has never used smokeless tobacco. She reports that she does not drink alcohol and does not use drugs. Medical:  has a past medical history of Acid reflux (12/15/2011), Anxiety, generalized (12/15/2011), Arthritis, Cervical pain (12/15/2011), Depression, GERD (gastroesophageal reflux disease), Hiatal hernia, LBP (low back pain) (12/15/2011), Migraine, Muscle ache (12/15/2011), Osteopenia of left hip, Pain (12/27/2012), Rheumatoid arthritis ([place]) (12/15/2011), and Scratched cornea (10/25/2015). Surgical: Ms. Stampley  has a past surgical history that includes Cholecystectomy. Family: family history includes Depression in her mother; Diabetes in her father; Hyperlipidemia in her mother; Hypertension in her father and mother.  Laboratory Chemistry Profile    Renal Lab Results  Component Value Date   BUN 6 03/03/2018   CREATININE 0.63 03/03/2018   BCR 10 03/03/2018   GFRAA 123 03/03/2018   GFRNONAA 106 03/03/2018     Hepatic Lab Results  Component Value Date   AST 21 03/03/2018   ALT 26 03/27/2015   ALBUMIN 4.6 03/03/2018   ALKPHOS 71 03/03/2018     Electrolytes Lab Results  Component Value Date   NA 136 03/03/2018   K 3.9 03/03/2018   CL 97 03/03/2018   CALCIUM 8.9 03/03/2018   MG 2.1 03/03/2018     Bone Lab Results  Component Value Date   VD25OH 29.3 (L) 12/31/2014     Inflammation (CRP: Acute Phase) (ESR: Chronic Phase) Lab Results  Component Value Date   CRP <1 03/03/2018   ESRSEDRATE 2 03/03/2018       Note: Above Lab results reviewed.  Recent Imaging Review  DG Lumbar Spine Complete W/Bend CLINICAL DATA:  Chronic pain, no injury Please comment in the report about spinal instability (>4 mm displacement), in addition to any: Spondylolisthesis (specify retro- or antero- and Grade level) (describe displacement in millimeters )  EXAM: LUMBAR SPINE - COMPLETE WITH BENDING VIEWS  COMPARISON:  None.  FINDINGS: There is mild degenerative change in the facet  joints of the lower lumbar spine. No acute fracture or subluxation. Surgical clips are noted in the right upper quadrant the abdomen. Intrauterine device overlies the central pelvis.  IMPRESSION: Mild lower lumbar degenerative changes.  No spondylolisthesis.  Electronically Signed   By: Nolon Nations M.D.   On: 02/18/2017 15:47 DG Thoracic Spine 2 View CLINICAL DATA:  Chronic pain, no injury Please comment in the report about spinal instability (>4 mm displacement), in addition to any: Spondylolisthesis (specify retro- or antero- and Grade level) (describe displacement in millimeters);  EXAM: THORACIC SPINE 2 VIEWS  COMPARISON:  None.  FINDINGS: There is mild S shaped scoliosis of the thoracic spine not associated with vertebral body  anomalies. Mild degenerative changes are seen in the thoracic spine. No spondylolisthesis. Mild anterior wedge morphology noted at T11 is of indeterminate age. No acute fracture.  IMPRESSION: Mild midthoracic spondylosis.  No spondylolisthesis.  Mild anterior wedging of T11, age indeterminate.  Electronically Signed   By: Nolon Nations M.D.   On: 02/18/2017 15:44 Note: Reviewed        Physical Exam  General appearance: Well nourished, well developed, and well hydrated. In no apparent acute distress Mental status: Alert, oriented x 3 (person, place, & time)       Respiratory: No evidence of acute respiratory distress Eyes: PERLA Vitals: BP (!) 152/92   Pulse 100   Temp (!) 97.3 F (36.3 C)   Resp 18   Ht 5' 6"  (1.676 m)   Wt 150 lb (68 kg)   SpO2 100%   BMI 24.21 kg/m  BMI: Estimated body mass index is 24.21 kg/m as calculated from the following:   Height as of this encounter: 5' 6"  (1.676 m).   Weight as of this encounter: 150 lb (68 kg). Ideal: Ideal body weight: 59.3 kg (130 lb 11.7 oz) Adjusted ideal body weight: 62.8 kg (138 lb 7 oz)  Assessment   Status Diagnosis  Controlled Controlled Controlled 1. Chronic pain syndrome   2. Chronic low back pain (Primary Area of Pain) (Bilateral) (R>L)   3. Chronic knee pain (Secondary area of Pain) (Bilateral) (R>L)   4. Pharmacologic therapy   5. Uncomplicated opioid dependence ([place])      Updated Problems: No problems updated.  Plan of Care  Problem-specific:  No problem-specific Assessment & Plan notes found for this encounter.  Ms. Makylie Rivere has a current medication list which includes the following long-term medication(s): bupropion, calcium carbonate-vitamin d, citalopram, cyclobenzaprine, famotidine, levonorgestrel, levothyroxine, [START ON 02/18/2021] naloxone, nature-throid, norethindrone, [START ON 03/29/2020] oxycodone hcl, [START ON 04/28/2020] oxycodone hcl, and [START ON 05/28/2020] oxycodone  hcl.  Pharmacotherapy (Medications Ordered): Meds ordered this encounter  Medications  . Oxycodone HCl 10 MG TABS    Sig: Take 1 tablet (10 mg total) by mouth every 6 (six) hours as needed. Must last 30 days    Dispense:  120 tablet    Refill:  0    Chronic Pain: STOP Act (Not applicable) Fill 1 day early if closed on refill date. Avoid benzodiazepines within 8 hours of opioids  . Oxycodone HCl 10 MG TABS    Sig: Take 1 tablet (10 mg total) by mouth every 6 (six) hours as needed. Must last 30 days    Dispense:  120 tablet    Refill:  0    Chronic Pain: STOP Act (Not applicable) Fill 1 day early if closed on refill date. Avoid benzodiazepines within 8 hours of opioids  . Oxycodone HCl  10 MG TABS    Sig: Take 1 tablet (10 mg total) by mouth every 6 (six) hours as needed. Must last 30 days    Dispense:  120 tablet    Refill:  0    Chronic Pain: STOP Act (Not applicable) Fill 1 day early if closed on refill date. Avoid benzodiazepines within 8 hours of opioids   Orders:  No orders of the defined types were placed in this encounter.  Follow-up plan:   Return in about 3 months (around 06/27/2020) for (F2F), (Med Mgmt).      Interventional therapies: Planned, scheduled, and/or pending:   The patient apparently had a bad experience with another physician receiving blocks.    Considering:   Diagnostic bilateral lumbar facet block  Possible bilateral lumbar facet RFA  Diagnostic right L5-S1 LESI  Possible right L5 vs S1 TFESI  Diagnostic bilateral IA knee injections (w/ steroid)  Possible series of 5 IA Hyalgan knee injections  Diagnostic bilateral Genicular NB  Possible bilateral Genicular nerve RFA    Palliative PRN treatment(s):   At this point, we have done no injections for this patient.         Recent Visits Date Type Provider Dept  02/19/20 Telemedicine Milinda Pointer, MD [facility]  Showing recent visits within past 90 days and meeting all other  requirements Today's Visits Date Type Provider Dept  03/25/20 Office Visit Milinda Pointer, MD [facility]  Showing today's visits and meeting all other requirements Future Appointments No visits were found meeting these conditions. Showing future appointments within next 90 days and meeting all other requirements  I discussed the assessment and treatment plan with the patient. The patient was provided an opportunity to ask questions and all were answered. The patient agreed with the plan and demonstrated an understanding of the instructions.  Patient advised to call back or seek an in-person evaluation if the symptoms or condition worsens.  Duration of encounter: 30 minutes.  Note by: Gaspar Cola, MD Date: 03/25/2020; Time: 8:29 AM

## 2020-03-25 ENCOUNTER — Ambulatory Visit: Payer: 59 | Attending: Pain Medicine | Admitting: Pain Medicine

## 2020-03-25 ENCOUNTER — Other Ambulatory Visit: Payer: Self-pay

## 2020-03-25 ENCOUNTER — Encounter: Payer: Self-pay | Admitting: Pain Medicine

## 2020-03-25 VITALS — BP 152/92 | HR 100 | Temp 97.3°F | Resp 18 | Ht 66.0 in | Wt 150.0 lb

## 2020-03-25 DIAGNOSIS — G8929 Other chronic pain: Secondary | ICD-10-CM | POA: Insufficient documentation

## 2020-03-25 DIAGNOSIS — G894 Chronic pain syndrome: Secondary | ICD-10-CM | POA: Diagnosis present

## 2020-03-25 DIAGNOSIS — F1120 Opioid dependence, uncomplicated: Secondary | ICD-10-CM | POA: Diagnosis present

## 2020-03-25 DIAGNOSIS — M5441 Lumbago with sciatica, right side: Secondary | ICD-10-CM | POA: Diagnosis present

## 2020-03-25 DIAGNOSIS — Z79899 Other long term (current) drug therapy: Secondary | ICD-10-CM | POA: Diagnosis present

## 2020-03-25 DIAGNOSIS — M25562 Pain in left knee: Secondary | ICD-10-CM | POA: Insufficient documentation

## 2020-03-25 DIAGNOSIS — M25561 Pain in right knee: Secondary | ICD-10-CM

## 2020-03-25 DIAGNOSIS — M5442 Lumbago with sciatica, left side: Secondary | ICD-10-CM | POA: Insufficient documentation

## 2020-03-25 MED ORDER — OXYCODONE HCL 10 MG PO TABS: 10.0000 mg | ORAL_TABLET | Freq: Four times a day (QID) | ORAL | 0 refills | Status: DC | PRN

## 2020-03-25 NOTE — Patient Instructions (Signed)
____________________________________________________________________________________________  Medication Rules  Purpose: To inform patients, and their family members, of our rules and regulations.  Applies to: All patients receiving prescriptions (written or electronic).  Pharmacy of record: Pharmacy where electronic prescriptions will be sent. If written prescriptions are taken to a different pharmacy, please inform the nursing staff. The pharmacy listed in the electronic medical record should be the one where you would like electronic prescriptions to be sent.  Electronic prescriptions: In compliance with the [place] Strengthen Opioid Misuse Prevention (STOP) Act of 2017 (Session Law 2017-74/H243), effective February 02, 2018, all controlled substances must be electronically prescribed. Calling prescriptions to the pharmacy will cease to exist.  Prescription refills: Only during scheduled appointments. Applies to all prescriptions.  NOTE: The following applies primarily to controlled substances (Opioid* Pain Medications).   Type of encounter (visit): For patients receiving controlled substances, face-to-face visits are required. (Not an option or up to the patient.)  Patient's responsibilities: 1. Pain Pills: Bring all pain pills to every appointment (except for procedure appointments). 2. Pill Bottles: Bring pills in original pharmacy bottle. Always bring the newest bottle. Bring bottle, even if empty. 3. Medication refills: You are responsible for knowing and keeping track of what medications you take and those you need refilled. The day before your appointment: write a list of all prescriptions that need to be refilled. The day of the appointment: give the list to the admitting nurse. Prescriptions will be written only during appointments. No prescriptions will be written on procedure days. If you forget a medication: it will not be "Called in", "Faxed", or "electronically sent".  You will need to get another appointment to get these prescribed. No early refills. Do not call asking to have your prescription filled early. 4. Prescription Accuracy: You are responsible for carefully inspecting your prescriptions before leaving our office. Have the discharge nurse carefully go over each prescription with you, before taking them home. Make sure that your name is accurately spelled, that your address is correct. Check the name and dose of your medication to make sure it is accurate. Check the number of pills, and the written instructions to make sure they are clear and accurate. Make sure that you are given enough medication to last until your next medication refill appointment. 5. Taking Medication: Take medication as prescribed. When it comes to controlled substances, taking less pills or less frequently than prescribed is permitted and encouraged. Never take more pills than instructed. Never take medication more frequently than prescribed.  6. Inform other Doctors: Always inform, all of your healthcare providers, of all the medications you take. 7. Pain Medication from other Providers: You are not allowed to accept any additional pain medication from any other Doctor or Healthcare provider. There are two exceptions to this rule. (see below) In the event that you require additional pain medication, you are responsible for notifying us, as stated below. 8. Cough Medicine: Often these contain an opioid, such as codeine or hydrocodone. Never accept or take cough medicine containing these opioids if you are already taking an opioid* medication. The combination may cause respiratory failure and death. 9. Medication Agreement: You are responsible for carefully reading and following our Medication Agreement. This must be signed before receiving any prescriptions from our practice. Safely store a copy of your signed Agreement. Violations to the Agreement will result in no further prescriptions.  (Additional copies of our Medication Agreement are available upon request.) 10. Laws, Rules, & Regulations: All patients are expected to follow all   Federal and State Laws, Statutes, Rules, & Regulations. Ignorance of the Laws does not constitute a valid excuse.  11. Illegal drugs and Controlled Substances: The use of illegal substances (including, but not limited to marijuana and its derivatives) and/or the illegal use of any controlled substances is strictly prohibited. Violation of this rule may result in the immediate and permanent discontinuation of any and all prescriptions being written by our practice. The use of any illegal substances is prohibited. 12. Adopted CDC guidelines & recommendations: Target dosing levels will be at or below 60 MME/day. Use of benzodiazepines** is not recommended.  Exceptions: There are only two exceptions to the rule of not receiving pain medications from other Healthcare Providers. 1. Exception #1 (Emergencies): In the event of an emergency (i.e.: accident requiring emergency care), you are allowed to receive additional pain medication. However, you are responsible for: As soon as you are able, call our office [phone], at any time of the day or night, and leave a message stating your name, the date and nature of the emergency, and the name and dose of the medication prescribed. In the event that your call is answered by a member of our staff, make sure to document and save the date, time, and the name of the person that took your information.  2. Exception #2 (Planned Surgery): In the event that you are scheduled by another doctor or dentist to have any type of surgery or procedure, you are allowed (for a period no longer than 30 days), to receive additional pain medication, for the acute post-op pain. However, in this case, you are responsible for picking up a copy of our "Post-op Pain Management for Surgeons" handout, and giving it to your surgeon or dentist. This  document is available at our office, and does not require an appointment to obtain it. Simply go to our office during business hours (Monday-Thursday from 8:00 AM to 4:00 PM) (Friday 8:00 AM to 12:00 Noon) or if you have a scheduled appointment with us, prior to your surgery, and ask for it by name. In addition, you are responsible for: calling our office [phone], at any time of the day or night, and leaving a message stating your name, name of your surgeon, type of surgery, and date of procedure or surgery. Failure to comply with your responsibilities may result in termination of therapy involving the controlled substances.  *Opioid medications include: morphine, codeine, oxycodone, oxymorphone, hydrocodone, hydromorphone, meperidine, tramadol, tapentadol, buprenorphine, fentanyl, methadone. **Benzodiazepine medications include: diazepam (Valium), alprazolam (Xanax), clonazepam (Klonopine), lorazepam (Ativan), clorazepate (Tranxene), chlordiazepoxide (Librium), estazolam (Prosom), oxazepam (Serax), temazepam (Restoril), triazolam (Halcion) (Last updated: 01/01/2020) ____________________________________________________________________________________________   ____________________________________________________________________________________________  Medication Recommendations and Reminders  Applies to: All patients receiving prescriptions (written and/or electronic).  Medication Rules & Regulations: These rules and regulations exist for your safety and that of others. They are not flexible and neither are we. Dismissing or ignoring them will be considered "non-compliance" with medication therapy, resulting in complete and irreversible termination of such therapy. (See document titled "Medication Rules" for more details.) In all conscience, because of safety reasons, we cannot continue providing a therapy where the patient does not follow instructions.  Pharmacy of record:   Definition:  This is the pharmacy where your electronic prescriptions will be sent.   We do not endorse any particular pharmacy, however, we have experienced problems with [facility] not securing enough medication supply for the community.  We do not restrict you in your choice of pharmacy. However,   once we write for your prescriptions, we will NOT be re-sending more prescriptions to fix restricted supply problems created by your pharmacy, or your insurance.   The pharmacy listed in the electronic medical record should be the one where you want electronic prescriptions to be sent.  If you choose to change pharmacy, simply notify our nursing staff.  Recommendations:  Keep all of your pain medications in a safe place, under lock and key, even if you live alone. We will NOT replace lost, stolen, or damaged medication.  After you fill your prescription, take 1 week's worth of pills and put them away in a safe place. You should keep a separate, properly labeled bottle for this purpose. The remainder should be kept in the original bottle. Use this as your primary supply, until it runs out. Once it's gone, then you know that you have 1 week's worth of medicine, and it is time to come in for a prescription refill. If you do this correctly, it is unlikely that you will ever run out of medicine.  To make sure that the above recommendation works, it is very important that you make sure your medication refill appointments are scheduled at least 1 week before you run out of medicine. To do this in an effective manner, make sure that you do not leave the office without scheduling your next medication management appointment. Always ask the nursing staff to show you in your prescription , when your medication will be running out. Then arrange for the receptionist to get you a return appointment, at least 7 days before you run out of medicine. Do not wait until you have 1 or 2 pills left, to come in. This is very poor planning and  does not take into consideration that we may need to cancel appointments due to bad weather, sickness, or emergencies affecting our staff.  DO NOT ACCEPT A "Partial Fill": If for any reason your pharmacy does not have enough pills/tablets to completely fill or refill your prescription, do not allow for a "partial fill". The law allows the pharmacy to complete that prescription within 72 hours, without requiring a new prescription. If they do not fill the rest of your prescription within those 72 hours, you will need a separate prescription to fill the remaining amount, which we will NOT provide. If the reason for the partial fill is your insurance, you will need to talk to the pharmacist about payment alternatives for the remaining tablets, but again, DO NOT ACCEPT A PARTIAL FILL, unless you can trust your pharmacist to obtain the remainder of the pills within 72 hours.  Prescription refills and/or changes in medication(s):   Prescription refills, and/or changes in dose or medication, will be conducted only during scheduled medication management appointments. (Applies to both, written and electronic prescriptions.)  No refills on procedure days. No medication will be changed or started on procedure days. No changes, adjustments, and/or refills will be conducted on a procedure day. Doing so will interfere with the diagnostic portion of the procedure.  No phone refills. No medications will be "called into the pharmacy".  No Fax refills.  No weekend refills.  No Holliday refills.  No after hours refills.  Remember:  Business hours are:  Monday to Thursday 8:00 AM to 4:00 PM Provider's Schedule: Bobbie Valletta, MD - Appointments are:  Medication management: Monday and Wednesday 8:00 AM to 4:00 PM Procedure day: Tuesday and Thursday 7:30 AM to 4:00 PM Bilal Lateef, MD - Appointments are:    Medication management: Tuesday and Thursday 8:00 AM to 4:00 PM Procedure day: Monday and Wednesday  7:30 AM to 4:00 PM (Last update: 08/23/2019) ____________________________________________________________________________________________   ____________________________________________________________________________________________  CBD (cannabidiol) WARNING  Applicable to: All individuals currently taking or considering taking CBD (cannabidiol) and, more important, all patients taking opioid analgesic controlled substances (pain medication). (Example: oxycodone; oxymorphone; hydrocodone; hydromorphone; morphine; methadone; tramadol; tapentadol; fentanyl; buprenorphine; butorphanol; dextromethorphan; meperidine; codeine; etc.)  Legal status: CBD remains a Schedule I drug prohibited for any use. CBD is illegal with one exception. In the [place], CBD has a limited Food and Drug Administration (FDA) approval for the treatment of two specific types of epilepsy disorders. Only one CBD product has been approved by the FDA for this purpose: "Epidiolex". FDA is aware that some companies are marketing products containing cannabis and cannabis-derived compounds in ways that violate the Federal Food, Drug and Cosmetic Act (FD&C Act) and that may put the health and safety of consumers at risk. The FDA, a Federal agency, has not enforced the CBD status since 2018.   Legality: Some manufacturers ship CBD products nationally, which is illegal. Often such products are sold online and are therefore available throughout the country. CBD is openly sold in head shops and health food stores in some states where such sales have not been explicitly legalized. Selling unapproved products with unsubstantiated therapeutic claims is not only a violation of the law, but also can put patients at risk, as these products have not been proven to be safe or effective. Federal illegality makes it difficult to conduct research on CBD.  Reference: "FDA Regulation of Cannabis and Cannabis-Derived Products, Including Cannabidiol  (CBD)" - https://www.fda.gov/news-events/public-health-focus/fda-regulation-cannabis-and-cannabis-derived-products-including-cannabidiol-cbd  Warning: CBD is not FDA approved and has not undergo the same manufacturing controls as prescription drugs.  This means that the purity and safety of available CBD may be questionable. Most of the time, despite manufacturer's claims, it is contaminated with THC (delta-9-tetrahydrocannabinol - the chemical in marijuana responsible for the "HIGH").  When this is the case, the THC contaminant will trigger a positive urine drug screen (UDS) test for Marijuana (carboxy-THC). Because a positive UDS for any illicit substance is a violation of our medication agreement, your opioid analgesics (pain medicine) may be permanently discontinued.  MORE ABOUT CBD  General Information: CBD  is a derivative of the Marijuana (cannabis sativa) plant discovered in 1940. It is one of the 113 identified substances found in Marijuana. It accounts for up to 40% of the plant's extract. As of 2018, preliminary clinical studies on CBD included research for the treatment of anxiety, movement disorders, and pain. CBD is available and consumed in multiple forms, including inhalation of smoke or vapor, as an aerosol spray, and by mouth. It may be supplied as an oil containing CBD, capsules, dried cannabis, or as a liquid solution. CBD is thought not to be as psychoactive as THC (delta-9-tetrahydrocannabinol - the chemical in marijuana responsible for the "HIGH"). Studies suggest that CBD may interact with different biological target receptors in the body, including cannabinoid and other neurotransmitter receptors. As of 2018 the mechanism of action for its biological effects has not been determined.  Side-effects  Adverse reactions: Dry mouth, diarrhea, decreased appetite, fatigue, drowsiness, malaise, weakness, sleep disturbances, and others.  Drug interactions: CBC may interact with other  medications such as blood-thinners. (Last update: 09/09/2019) ____________________________________________________________________________________________    

## 2020-03-25 NOTE — Progress Notes (Signed)
Nursing Pain Medication Assessment:  Safety precautions to be maintained throughout the outpatient stay will include: orient to surroundings, keep bed in low position, maintain call bell within reach at all times, provide assistance with transfer out of bed and ambulation.  Medication Inspection Compliance: Pill count conducted under aseptic conditions, in front of the patient. Neither the pills nor the bottle was removed from the patient's sight at any time. Once count was completed pills were immediately returned to the patient in their original bottle.  Medication: Oxycodone IR Pill/Patch Count: 18 of 120 pills remain Pill/Patch Appearance: Markings consistent with prescribed medication Bottle Appearance: Standard pharmacy container. Clearly labeled. Filled Date:02/28/2020 Last Medication intake:  Today

## 2020-03-26 ENCOUNTER — Telehealth: Payer: Self-pay | Admitting: Pain Medicine

## 2020-03-26 NOTE — Telephone Encounter (Signed)
error 

## 2020-05-28 DIAGNOSIS — M797 Fibromyalgia: Secondary | ICD-10-CM | POA: Insufficient documentation

## 2020-06-09 NOTE — Progress Notes (Signed)
PROVIDER NOTE: Information contained herein reflects review and annotations entered in association with encounter. Interpretation of such information and data should be left to medically-trained personnel. Information provided to patient can be located elsewhere in the medical record under "Patient Instructions". Document created using STT-dictation technology, any transcriptional errors that may result from process are unintentional.    Patient: Holly Spears  Service Category: E/M  Provider: Gaspar Cola, MD  DOB: [birth year]  DOS: 06/10/2020  Specialty: Interventional Pain Management  MRN: [id number]  Setting: Ambulatory outpatient  PCP: Lonzo Cloud, PA-C  Type: Established Patient    Referring Provider: Lonzo Cloud, PA-C  Location: Office  Delivery: Face-to-face     HPI  Holly Spears, a 50 y.o. year old female, is here today because of her Chronic pain syndrome [G89.4]. Ms. Greb's primary complain today is Back Pain (Mid back midline ) and Other (Rheumatoid arthritis ) Last encounter: My last encounter with her was on 03/26/2020. Pertinent problems: 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; Lumbar spondylosis (L3-4, L4-5, and L5-S1); Lumbar bulging disc (L3-4, L4-5, and L5-S1); Chronic lumbar radicular pain (Bilateral) (R>L); Chronic knee pain (2ry area of Pain) (Bilateral) (R>L); Chronic upper back pain (Left); Thoracic disc herniation (Large, Left paracentral T10-11 disc herniation);  Thoracic spinal stenosis (Severe, Left, T10-11 Lateral Recess Stenosis); 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 Pain Assessment: Severity of Chronic pain is reported as a 3 /10. Location: Back Mid/down right leg to the heel. Onset: More than a month ago. Quality: Discomfort,Constant,Aching. Timing: Constant. Modifying factor(s): heat, medications. Vitals:  height is 5' 6"  (1.676 m) and weight is 150 lb (68 kg). Her temporal temperature is 97.3 F (36.3 C) (abnormal). Her blood pressure is 155/95 (abnormal) and her pulse is 106 (abnormal). Her respiration is 16 and oxygen saturation is 100%.   Reason for encounter: medication management.   The patient indicates doing well with the current medication regimen. No adverse reactions or side effects reported to the medications.   RTCB: 09/25/2020  Nonopioids transferred 11/22/2019: Flexeril  Pharmacotherapy Assessment   Analgesic: Oxycodone IR 10 mg, 1 tab PO q 6 hrs (40 mg/day of oxycodone) MME/day:60 mg/day.   Monitoring: [place] PMP: PDMP reviewed during this encounter.       Pharmacotherapy: No side-effects or adverse reactions reported. Compliance: No problems identified. Effectiveness: Clinically acceptable.  Janett Billow, RN  06/10/2020  8:14 AM  Sign when Signing Visit Nursing Pain Medication Assessment:  Safety precautions to be maintained throughout the outpatient stay will include: orient to surroundings, keep bed in low position, maintain call bell within reach at all times, provide assistance with transfer out of bed and ambulation.  Medication Inspection Compliance: Pill count conducted under aseptic conditions, in front of the patient. Neither the pills nor the bottle was removed from the  patient's sight at any time. Once count was completed pills were immediately returned to the patient  in their original bottle.  Medication: Oxycodone IR Pill/Patch Count: 66 of 120 pills remain Pill/Patch Appearance: Markings consistent with prescribed medication Bottle Appearance: Standard pharmacy container. Clearly labeled. Filled Date: 04 / 26 / 2022 Last Medication intake:  Today    UDS:  Summary  Date Value Ref Range Status  08/24/2019 Note  Final    Comment:    ==================================================================== [employer]ox[employer] Select 13 (MW) ==================================================================== Specimen Alert Note: Urinary creatinine is low; ability to detect some drugs may be compromised. Interpret results with caution. (Creatinine) ==================================================================== Test                             Result       Flag       Units  Drug Present and Declared for Prescription Verification   Oxycodone                      658          EXPECTED   ng/mg creat   Noroxycodone                   3308         EXPECTED   ng/mg creat    Sources of oxycodone include scheduled prescription medications.    Noroxycodone is an expected metabolite of oxycodone.  Drug Absent but Declared for Prescription Verification   Diazepam                       Not Detected UNEXPECTED ng/mg creat ==================================================================== Test                      Result    Flag   Units      Ref Range   Creatinine              12        LL     mg/dL      >=20 ==================================================================== Declared Medications:  The flagging and interpretation on this report are based on the  following declared medications.  Unexpected results may arise from  inaccuracies in the declared medications.   **Note: The testing scope of this panel includes these medications:   Diazepam (Valium)   Oxycodone   **Note: The testing scope of this panel does not include the  following reported medications:   Biotin  Bupropion (Wellbutrin)  Buspirone (Buspar)  Calcium  Cholecalciferol  Citalopram (Celexa)  Cyclobenzaprine (Flexeril)  Epinephrine  Famotidine  Hydroxychloroquine (Plaquenil)  Ibuprofen  Levonorgestrel  Levothyroxine  Liothyronine  Multivitamin  Potassium (Klor-Con)  Vitamin D ==================================================================== For clinical consultation, please call [phone]. ====================================================================      ROS  Constitutional: Denies any fever or chills Gastrointestinal: No reported hemesis, hematochezia, vomiting, or acute GI distress Musculoskeletal: Denies any acute onset joint swelling, redness, loss of ROM, or weakness Neurological: No reported episodes of acute onset apraxia, aphasia, dysarthria, agnosia, amnesia, paralysis, loss of coordination, or loss of consciousness  Medication Review  Biotin, Calcium Carbonate-Vitamin D, EPINEPHrine, Magnesium, Multi-Vitamins, Oxycodone HCl, Sodium Fluoride, Thyroid, Vitamin D3, Zinc, buPROPion, citalopram, cyclobenzaprine, famotidine, hydroxychloroquine, ibuprofen, levonorgestrel, levothyroxine, naloxone, norethindrone, and potassium chloride SA  History Review  Allergy: Ms. Arena is allergic to bee venom, penicillins, shellfish allergy, sulfa antibiotics, erythromycin, zaleplon, codeine, and trazodone. Drug: Ms. Nippert  reports no history of drug use. Alcohol:  reports no history of alcohol use. Tobacco:  reports  that she has quit smoking. She has never used smokeless tobacco. Social: Ms. Charnley  reports that she has quit smoking. She has never used smokeless tobacco. She reports that she does not drink alcohol and does not use drugs. Medical:  has a past medical history of Acid reflux (12/15/2011), Anxiety, generalized (12/15/2011), Arthritis,  Cervical pain (12/15/2011), Depression, GERD (gastroesophageal reflux disease), Hiatal hernia, LBP (low back pain) (12/15/2011), Migraine, Muscle ache (12/15/2011), Osteopenia of left hip, Pain (12/27/2012), Rheumatoid arthritis ([place]) (12/15/2011), and Scratched cornea (10/25/2015). Surgical: Ms. Anastos  has a past surgical history that includes Cholecystectomy. Family: family history includes Depression in her mother; Diabetes in her father; Hyperlipidemia in her mother; Hypertension in her father and mother.  Laboratory Chemistry Profile   Renal Lab Results  Component Value Date   BUN 6 03/03/2018   CREATININE 0.63 03/03/2018   BCR 10 03/03/2018   GFRAA 123 03/03/2018   GFRNONAA 106 03/03/2018     Hepatic Lab Results  Component Value Date   AST 21 03/03/2018   ALT 26 03/27/2015   ALBUMIN 4.6 03/03/2018   ALKPHOS 71 03/03/2018     Electrolytes Lab Results  Component Value Date   NA 136 03/03/2018   K 3.9 03/03/2018   CL 97 03/03/2018   CALCIUM 8.9 03/03/2018   MG 2.1 03/03/2018     Bone Lab Results  Component Value Date   VD25OH 29.3 (L) 12/31/2014     Inflammation (CRP: Acute Phase) (ESR: Chronic Phase) Lab Results  Component Value Date   CRP <1 03/03/2018   ESRSEDRATE 2 03/03/2018       Note: Above Lab results reviewed.  Recent Imaging Review  DG Lumbar Spine Complete W/Bend CLINICAL DATA:  Chronic pain, no injury Please comment in the report about spinal instability (>4 mm displacement), in addition to any: Spondylolisthesis (specify retro- or antero- and Grade level) (describe displacement in millimeters )  EXAM: LUMBAR SPINE - COMPLETE WITH BENDING VIEWS  COMPARISON:  None.  FINDINGS: There is mild degenerative change in the facet joints of the lower lumbar spine. No acute fracture or subluxation. Surgical clips are noted in the right upper quadrant the abdomen. Intrauterine device overlies the central pelvis.  IMPRESSION: Mild lower lumbar  degenerative changes.  No spondylolisthesis.  Electronically Signed   By: Nolon Nations M.D.   On: 02/18/2017 15:47 DG Thoracic Spine 2 View CLINICAL DATA:  Chronic pain, no injury Please comment in the report about spinal instability (>4 mm displacement), in addition to any: Spondylolisthesis (specify retro- or antero- and Grade level) (describe displacement in millimeters);  EXAM: THORACIC SPINE 2 VIEWS  COMPARISON:  None.  FINDINGS: There is mild S shaped scoliosis of the thoracic spine not associated with vertebral body anomalies. Mild degenerative changes are seen in the thoracic spine. No spondylolisthesis. Mild anterior wedge morphology noted at T11 is of indeterminate age. No acute fracture.  IMPRESSION: Mild midthoracic spondylosis.  No spondylolisthesis.  Mild anterior wedging of T11, age indeterminate.  Electronically Signed   By: Nolon Nations M.D.   On: 02/18/2017 15:44 Note: Reviewed        Physical Exam  General appearance: Well nourished, well developed, and well hydrated. In no apparent acute distress Mental status: Alert, oriented x 3 (person, place, & time)       Respiratory: No evidence of acute respiratory distress Eyes: PERLA Vitals: BP (!) 155/95 (BP Location: Right Arm, Patient Position: Sitting, Cuff Size: Normal)   Pulse (!) 106  Temp (!) 97.3 F (36.3 C) (Temporal)   Resp 16   Ht 5' 6"  (1.676 m)   Wt 150 lb (68 kg)   SpO2 100%   BMI 24.21 kg/m  BMI: Estimated body mass index is 24.21 kg/m as calculated from the following:   Height as of this encounter: 5' 6"  (1.676 m).   Weight as of this encounter: 150 lb (68 kg). Ideal: Ideal body weight: 59.3 kg (130 lb 11.7 oz) Adjusted ideal body weight: 62.8 kg (138 lb 7 oz)  Assessment   Status Diagnosis  Controlled Controlled Controlled 1. Chronic pain syndrome   2. Chronic low back pain (Primary Area of Pain) (Bilateral) (R>L)   3. Chronic knee pain (Secondary area of Pain)  (Bilateral) (R>L)   4. Pharmacologic therapy   5. Chronic use of opiate for therapeutic purpose   6. Uncomplicated opioid dependence ([place])      Updated Problems: Problem  Fibromyalgia  Chronic low back pain (1ry area of Pain) (Bilateral) (R>L)  Chronic knee pain (2ry area of Pain) (Bilateral) (R>L)  Chronic Use of Opiate for Therapeutic Purpose  Uncomplicated Opioid Dependence ([facility])    Plan of Care  Problem-specific:  No problem-specific Assessment & Plan notes found for this encounter.  Ms. Cherina Dhillon has a current medication list which includes the following long-term medication(s): calcium carbonate-vitamin d, citalopram, cyclobenzaprine, famotidine, levonorgestrel, levothyroxine, [START ON 02/18/2021] naloxone, bupropion, nature-throid, norethindrone, [START ON 06/27/2020] oxycodone hcl, [START ON 07/27/2020] oxycodone hcl, and [START ON 08/26/2020] oxycodone hcl.  Pharmacotherapy (Medications Ordered): Meds ordered this encounter  Medications  . Oxycodone HCl 10 MG TABS    Sig: Take 1 tablet (10 mg total) by mouth every 6 (six) hours as needed. Must last 30 days    Dispense:  120 tablet    Refill:  0    Not a duplicate. Do NOT delete! Dispense 1 day early if closed on refill date. Avoid benzodiazepines within 8 hours of opioids. Do not send refill requests.  . Oxycodone HCl 10 MG TABS    Sig: Take 1 tablet (10 mg total) by mouth every 6 (six) hours as needed. Must last 30 days    Dispense:  120 tablet    Refill:  0    Not a duplicate. Do NOT delete! Dispense 1 day early if closed on refill date. Avoid benzodiazepines within 8 hours of opioids. Do not send refill requests.  . Oxycodone HCl 10 MG TABS    Sig: Take 1 tablet (10 mg total) by mouth every 6 (six) hours as needed. Must last 30 days    Dispense:  120 tablet    Refill:  0    Not a duplicate. Do NOT delete! Dispense 1 day early if closed on refill date. Avoid benzodiazepines within 8 hours of opioids. Do not send  refill requests.   Orders:  Orders Placed This Encounter  Procedures  . Tox[employer]SU[employer] Select 13 (MW), Urine    Volume: 30 ml(s). Minimum 3 ml of urine is needed. Document temperature of fresh sample. Indications: Long term (current) use of opiate analgesic (W26.378)    Order Specific Question:   Release to patient    Answer:   Immediate   Follow-up plan:   Return for evaluation day, (F2F), (MM).      Interventional therapies: Planned, scheduled, and/or pending:   The patient apparently had a bad experience with another physician receiving blocks.    Considering:   Diagnostic bilateral lumbar facet block  Possible  bilateral lumbar facet RFA  Diagnostic right L5-S1 LESI  Possible right L5 vs S1 TFESI  Diagnostic bilateral IA knee injections (w/ steroid)  Possible series of 5 IA Hyalgan knee injections  Diagnostic bilateral Genicular NB  Possible bilateral Genicular nerve RFA    Palliative PRN treatment(s):   At this point, we have done no injections for this patient.    Recent Visits Date Type Provider Dept  03/25/20 Office Visit Milinda Pointer, MD [facility]  Showing recent visits within past 90 days and meeting all other requirements Today's Visits Date Type Provider Dept  06/10/20 Office Visit Milinda Pointer, MD [facility]  Showing today's visits and meeting all other requirements Future Appointments No visits were found meeting these conditions. Showing future appointments within next 90 days and meeting all other requirements  I discussed the assessment and treatment plan with the patient. The patient was provided an opportunity to ask questions and all were answered. The patient agreed with the plan and demonstrated an understanding of the instructions.  Patient advised to call back or seek an in-person evaluation if the symptoms or condition worsens.  Duration of encounter: 30 minutes.  Note by: Gaspar Cola, MD Date: 06/10/2020;  Time: 8:43 AM

## 2020-06-10 ENCOUNTER — Encounter: Payer: Self-pay | Admitting: Pain Medicine

## 2020-06-10 ENCOUNTER — Ambulatory Visit: Payer: 59 | Attending: Pain Medicine | Admitting: Pain Medicine

## 2020-06-10 ENCOUNTER — Other Ambulatory Visit: Payer: Self-pay

## 2020-06-10 VITALS — BP 155/95 | HR 106 | Temp 97.3°F | Resp 16 | Ht 66.0 in | Wt 150.0 lb

## 2020-06-10 DIAGNOSIS — M25562 Pain in left knee: Secondary | ICD-10-CM | POA: Diagnosis present

## 2020-06-10 DIAGNOSIS — M5442 Lumbago with sciatica, left side: Secondary | ICD-10-CM | POA: Diagnosis present

## 2020-06-10 DIAGNOSIS — G894 Chronic pain syndrome: Secondary | ICD-10-CM

## 2020-06-10 DIAGNOSIS — M5441 Lumbago with sciatica, right side: Secondary | ICD-10-CM | POA: Diagnosis present

## 2020-06-10 DIAGNOSIS — Z79899 Other long term (current) drug therapy: Secondary | ICD-10-CM | POA: Diagnosis not present

## 2020-06-10 DIAGNOSIS — Z79891 Long term (current) use of opiate analgesic: Secondary | ICD-10-CM | POA: Diagnosis present

## 2020-06-10 DIAGNOSIS — G8929 Other chronic pain: Secondary | ICD-10-CM | POA: Diagnosis present

## 2020-06-10 DIAGNOSIS — F1120 Opioid dependence, uncomplicated: Secondary | ICD-10-CM

## 2020-06-10 DIAGNOSIS — M25561 Pain in right knee: Secondary | ICD-10-CM

## 2020-06-10 MED ORDER — OXYCODONE HCL 10 MG PO TABS: 10.0000 mg | ORAL_TABLET | Freq: Four times a day (QID) | ORAL | 0 refills | Status: DC | PRN

## 2020-06-10 NOTE — Patient Instructions (Signed)
____________________________________________________________________________________________  Medication Rules  Purpose: To inform patients, and their family members, of our rules and regulations.  Applies to: All patients receiving prescriptions (written or electronic).  Pharmacy of record: Pharmacy where electronic prescriptions will be sent. If written prescriptions are taken to a different pharmacy, please inform the nursing staff. The pharmacy listed in the electronic medical record should be the one where you would like electronic prescriptions to be sent.  Electronic prescriptions: In compliance with the [place]reng[place] Opioid Misuse Prevention (STOP) Act of 2017 (Session Law 2017-74/H243), effective February 02, 2018, all controlled substances must be electronically prescribed. Calling prescriptions to the pharmacy will cease to exist.  Prescription refills: Only during scheduled appointments. Applies to all prescriptions.  NOTE: The following applies primarily to controlled substances (Opioid* Pain Medications).   Type of encounter (visit): For patients receiving controlled substances, face-to-face visits are required. (Not an option or up to the patient.)  Patient's responsibilities: 1. Pain Pills: Bring all pain pills to every appointment (except for procedure appointments). 2. Pill Bottles: Bring pills in original pharmacy bottle. Always bring the newest bottle. Bring bottle, even if empty. 3. Medication refills: You are responsible for knowing and keeping track of what medications you take and those you need refilled. The day before your appointment: write a list of all prescriptions that need to be refilled. The day of the appointment: give the list to the admitting nurse. Prescriptions will be written only during appointments. No prescriptions will be written on procedure days. If you forget a medication: it will not be "Called in", "Faxed", or "electronically sent".  You will need to get another appointment to get these prescribed. No early refills. Do not call asking to have your prescription filled early. 4. Prescription Accuracy: You are responsible for carefully inspecting your prescriptions before leaving our office. Have the discharge nurse carefully go over each prescription with you, before taking them home. Make sure that your name is accurately spelled, that your address is correct. Check the name and dose of your medication to make sure it is accurate. Check the number of pills, and the written instructions to make sure they are clear and accurate. Make sure that you are given enough medication to last until your next medication refill appointment. 5. Taking Medication: Take medication as prescribed. When it comes to controlled substances, taking less pills or less frequently than prescribed is permitted and encouraged. Never take more pills than instructed. Never take medication more frequently than prescribed.  6. Inform other Doctors: Always inform, all of your healthcare providers, of all the medications you take. 7. Pain Medication from other Providers: You are not allowed to accept any additional pain medication from any other Doctor or Healthcare provider. There are two exceptions to this rule. (see below) In the event that you require additional pain medication, you are responsible for notifying us, as stated below. 8. Cough Medicine: Often these contain an opioid, such as codeine or hydrocodone. Never accept or take cough medicine containing these opioids if you are already taking an opioid* medication. The combination may cause respiratory failure and death. 9. Medication Agreement: You are responsible for carefully reading and following our Medication Agreement. This must be signed before receiving any prescriptions from our practice. Safely store a copy of your signed Agreement. Violations to the Agreement will result in no further prescriptions.  (Additional copies of our Medication Agreement are available upon request.) 10. Laws, Rules, & Regulations: All patients are expected to follow all   Federal and State Laws, Statutes, Rules, & Regulations. Ignorance of the Laws does not constitute a valid excuse.  11. Illegal drugs and Controlled Substances: The use of illegal substances (including, but not limited to marijuana and its derivatives) and/or the illegal use of any controlled substances is strictly prohibited. Violation of this rule may result in the immediate and permanent discontinuation of any and all prescriptions being written by our practice. The use of any illegal substances is prohibited. 12. Adopted CDC guidelines & recommendations: Target dosing levels will be at or below 60 MME/day. Use of benzodiazepines** is not recommended.  Exceptions: There are only two exceptions to the rule of not receiving pain medications from other Healthcare Providers. 1. Exception #1 (Emergencies): In the event of an emergency (i.e.: accident requiring emergency care), you are allowed to receive additional pain medication. However, you are responsible for: As soon as you are able, call our office [phone], at any time of the day or night, and leave a message stating your name, the date and nature of the emergency, and the name and dose of the medication prescribed. In the event that your call is answered by a member of our staff, make sure to document and save the date, time, and the name of the person that took your information.  2. Exception #2 (Planned Surgery): In the event that you are scheduled by another doctor or dentist to have any type of surgery or procedure, you are allowed (for a period no longer than 30 days), to receive additional pain medication, for the acute post-op pain. However, in this case, you are responsible for picking up a copy of our "Post-op Pain Management for Surgeons" handout, and giving it to your surgeon or dentist. This  document is available at our office, and does not require an appointment to obtain it. Simply go to our office during business hours (Monday-Thursday from 8:00 AM to 4:00 PM) (Friday 8:00 AM to 12:00 Noon) or if you have a scheduled appointment with us, prior to your surgery, and ask for it by name. In addition, you are responsible for: calling our office [phone], at any time of the day or night, and leaving a message stating your name, name of your surgeon, type of surgery, and date of procedure or surgery. Failure to comply with your responsibilities may result in termination of therapy involving the controlled substances.  *Opioid medications include: morphine, codeine, oxycodone, oxymorphone, hydrocodone, hydromorphone, meperidine, tramadol, tapentadol, buprenorphine, fentanyl, methadone. **Benzodiazepine medications include: diazepam (Valium), alprazolam (Xanax), clonazepam (Klonopine), lorazepam (Ativan), clorazepate (Tranxene), chlordiazepoxide (Librium), estazolam (Prosom), oxazepam (Serax), temazepam (Restoril), triazolam (Halcion) (Last updated: 01/01/2020) ____________________________________________________________________________________________   ____________________________________________________________________________________________  Medication Recommendations and Reminders  Applies to: All patients receiving prescriptions (written and/or electronic).  Medication Rules & Regulations: These rules and regulations exist for your safety and that of others. They are not flexible and neither are we. Dismissing or ignoring them will be considered "non-compliance" with medication therapy, resulting in complete and irreversible termination of such therapy. (See document titled "Medication Rules" for more details.) In all conscience, because of safety reasons, we cannot continue providing a therapy where the patient does not follow instructions.  Pharmacy of record:   Definition:  This is the pharmacy where your electronic prescriptions will be sent.   We do not endorse any particular pharmacy, however, we have experienced problems with [facility] not securing enough medication supply for the community.  We do not restrict you in your choice of pharmacy. However,   once we write for your prescriptions, we will NOT be re-sending more prescriptions to fix restricted supply problems created by your pharmacy, or your insurance.   The pharmacy listed in the electronic medical record should be the one where you want electronic prescriptions to be sent.  If you choose to change pharmacy, simply notify our nursing staff.  Recommendations:  Keep all of your pain medications in a safe place, under lock and key, even if you live alone. We will NOT replace lost, stolen, or damaged medication.  After you fill your prescription, take 1 week's worth of pills and put them away in a safe place. You should keep a separate, properly labeled bottle for this purpose. The remainder should be kept in the original bottle. Use this as your primary supply, until it runs out. Once it's gone, then you know that you have 1 week's worth of medicine, and it is time to come in for a prescription refill. If you do this correctly, it is unlikely that you will ever run out of medicine.  To make sure that the above recommendation works, it is very important that you make sure your medication refill appointments are scheduled at least 1 week before you run out of medicine. To do this in an effective manner, make sure that you do not leave the office without scheduling your next medication management appointment. Always ask the nursing staff to show you in your prescription , when your medication will be running out. Then arrange for the receptionist to get you a return appointment, at least 7 days before you run out of medicine. Do not wait until you have 1 or 2 pills left, to come in. This is very poor planning and  does not take into consideration that we may need to cancel appointments due to bad weather, sickness, or emergencies affecting our staff.  DO NOT ACCEPT A "Partial Fill": If for any reason your pharmacy does not have enough pills/tablets to completely fill or refill your prescription, do not allow for a "partial fill". The law allows the pharmacy to complete that prescription within 72 hours, without requiring a new prescription. If they do not fill the rest of your prescription within those 72 hours, you will need a separate prescription to fill the remaining amount, which we will NOT provide. If the reason for the partial fill is your insurance, you will need to talk to the pharmacist about payment alternatives for the remaining tablets, but again, DO NOT ACCEPT A PARTIAL FILL, unless you can trust your pharmacist to obtain the remainder of the pills within 72 hours.  Prescription refills and/or changes in medication(s):   Prescription refills, and/or changes in dose or medication, will be conducted only during scheduled medication management appointments. (Applies to both, written and electronic prescriptions.)  No refills on procedure days. No medication will be changed or started on procedure days. No changes, adjustments, and/or refills will be conducted on a procedure day. Doing so will interfere with the diagnostic portion of the procedure.  No phone refills. No medications will be "called into the pharmacy".  No Fax refills.  No weekend refills.  No Holliday refills.  No after hours refills.  Remember:  Business hours are:  Monday to Thursday 8:00 AM to 4:00 PM Provider's Schedule: Jessicaann Overbaugh, MD - Appointments are:  Medication management: Monday and Wednesday 8:00 AM to 4:00 PM Procedure day: Tuesday and Thursday 7:30 AM to 4:00 PM Bilal Lateef, MD - Appointments are:    Medication management: Tuesday and Thursday 8:00 AM to 4:00 PM Procedure day: Monday and Wednesday  7:30 AM to 4:00 PM (Last update: 08/23/2019) ____________________________________________________________________________________________   ____________________________________________________________________________________________  CBD (cannabidiol) WARNING  Applicable to: All individuals currently taking or considering taking CBD (cannabidiol) and, more important, all patients taking opioid analgesic controlled substances (pain medication). (Example: oxycodone; oxymorphone; hydrocodone; hydromorphone; morphine; methadone; tramadol; tapentadol; fentanyl; buprenorphine; butorphanol; dextromethorphan; meperidine; codeine; etc.)  Legal status: CBD remains a Schedule I drug prohibited for any use. CBD is illegal with one exception. In the [place], CBD has a limited Food and Drug Administration (FDA) approval for the treatment of two specific types of epilepsy disorders. Only one CBD product has been approved by the FDA for this purpose: "Epidiolex". FDA is aware that some companies are marketing products containing cannabis and cannabis-derived compounds in ways that violate the Federal Food, Drug and Cosmetic Act (FD&C Act) and that may put the health and safety of consumers at risk. The FDA, a Federal agency, has not enforced the CBD status since 2018.   Legality: Some manufacturers ship CBD products nationally, which is illegal. Often such products are sold online and are therefore available throughout the country. CBD is openly sold in head shops and health food stores in some states where such sales have not been explicitly legalized. Selling unapproved products with unsubstantiated therapeutic claims is not only a violation of the law, but also can put patients at risk, as these products have not been proven to be safe or effective. Federal illegality makes it difficult to conduct research on CBD.  Reference: "FDA Regulation of Cannabis and Cannabis-Derived Products, Including Cannabidiol  (CBD)" - https://www.fda.gov/news-events/public-health-focus/fda-regulation-cannabis-and-cannabis-derived-products-including-cannabidiol-cbd  Warning: CBD is not FDA approved and has not undergo the same manufacturing controls as prescription drugs.  This means that the purity and safety of available CBD may be questionable. Most of the time, despite manufacturer's claims, it is contaminated with THC (delta-9-tetrahydrocannabinol - the chemical in marijuana responsible for the "HIGH").  When this is the case, the THC contaminant will trigger a positive urine drug screen (UDS) test for Marijuana (carboxy-THC). Because a positive UDS for any illicit substance is a violation of our medication agreement, your opioid analgesics (pain medicine) may be permanently discontinued.  MORE ABOUT CBD  General Information: CBD  is a derivative of the Marijuana (cannabis sativa) plant discovered in 1940. It is one of the 113 identified substances found in Marijuana. It accounts for up to 40% of the plant's extract. As of 2018, preliminary clinical studies on CBD included research for the treatment of anxiety, movement disorders, and pain. CBD is available and consumed in multiple forms, including inhalation of smoke or vapor, as an aerosol spray, and by mouth. It may be supplied as an oil containing CBD, capsules, dried cannabis, or as a liquid solution. CBD is thought not to be as psychoactive as THC (delta-9-tetrahydrocannabinol - the chemical in marijuana responsible for the "HIGH"). Studies suggest that CBD may interact with different biological target receptors in the body, including cannabinoid and other neurotransmitter receptors. As of 2018 the mechanism of action for its biological effects has not been determined.  Side-effects  Adverse reactions: Dry mouth, diarrhea, decreased appetite, fatigue, drowsiness, malaise, weakness, sleep disturbances, and others.  Drug interactions: CBC may interact with other  medications such as blood-thinners. (Last update: 09/09/2019) ____________________________________________________________________________________________    

## 2020-06-10 NOTE — Progress Notes (Signed)
Nursing Pain Medication Assessment:  Safety precautions to be maintained throughout the outpatient stay will include: orient to surroundings, keep bed in low position, maintain call bell within reach at all times, provide assistance with transfer out of bed and ambulation.  Medication Inspection Compliance: Pill count conducted under aseptic conditions, in front of the patient. Neither the pills nor the bottle was removed from the patient's sight at any time. Once count was completed pills were immediately returned to the patient in their original bottle.  Medication: Oxycodone IR Pill/Patch Count: 66 of 120 pills remain Pill/Patch Appearance: Markings consistent with prescribed medication Bottle Appearance: Standard pharmacy container. Clearly labeled. Filled Date: 04 / 26 / 2022 Last Medication intake:  Today

## 2020-06-19 LAB — TOXASSURE SELECT 13 (MW), URINE

## 2020-06-24 ENCOUNTER — Encounter: Payer: Medicare Other | Admitting: Pain Medicine

## 2020-09-09 NOTE — Progress Notes (Signed)
PROVIDER NOTE: Information contained herein reflects review and annotations entered in association with encounter. Interpretation of such information and data should be left to medically-trained personnel. Information provided to patient can be located elsewhere in the medical record under "Patient Instructions". Document created using STT-dictation technology, any transcriptional errors that may result from process are unintentional.    Patient: Holly Spears  Service Category: E/M  Provider: Gaspar Cola, MD  DOB: May 18, 1970  DOS: 09/11/2020  Specialty: Interventional Pain Management  MRN: [id number]  Setting: Ambulatory outpatient  PCP: Lonzo Cloud, PA-C  Type: Established Patient    Referring Provider: Lonzo Cloud, PA-C  Location: Office  Delivery: Face-to-face     HPI  [name], a 50 y.o. year old female, is here today because of her Chronic pain syndrome [G89.4]. [name]'s primary complain today is Abdominal Pain (Umbilical area) and Back Pain (low) Last encounter: My last encounter with her was on 06/10/2020. Pertinent problems: 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; Lumbar spondylosis (L3-4, L4-5, and L5-S1); Lumbar bulging disc (L3-4, L4-5, and L5-S1); Chronic lumbar radicular pain (Bilateral) (R>L); Chronic knee pain (2ry area of Pain) (Bilateral) (R>L); Chronic upper back pain (Left); Thoracic disc herniation (Large, Left paracentral T10-11 disc herniation); Thoracic  spinal stenosis (Severe, Left, T10-11 Lateral Recess Stenosis); 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 Pain Assessment: Severity of Chronic pain is reported as a 2 /10. Location: Back Lower/radiates down right leg to heel. Onset: More than a month ago. Quality: Throbbing. Timing: Constant. Modifying factor(s): heat. Vitals:  height is 5' 6"  (1.676 m) and weight is 175 lb (79.4 kg). Her temperature is 98.4 F (36.9 C). Her blood pressure is 146/98 (abnormal) and her pulse is 105 (abnormal). Her respiration is 16 and oxygen saturation is 100%.   Reason for encounter: medication management.   The patient indicates doing well with the current medication regimen. No adverse reactions or side effects reported to the medications.  The patient is pending to have an abdominal hysterectomy in the next couple months and therefore today I have provided her with a letter to be given to the surgeon on how to manage the medications around the time of the surgery.  RTCB: 01/23/2021  Today I have provided the patient with enough medication to last until 01/23/2021.  I have done this because with the usual prescription said of 90 days, her return would fall very close to my PTO. Nonopioids transferred 11/22/2019: Flexeril  Pharmacotherapy Assessment  Analgesic: Oxycodone IR 10 mg, 1 tab PO q 6 hrs (40 mg/day of oxycodone) MME/day: 60 mg/day.   Monitoring: [place] PMP: PDMP reviewed during this encounter.       Pharmacotherapy: No side-effects or adverse reactions reported. Compliance: No problems identified. Effectiveness: Clinically acceptable.  Dewayne Shorter, RN  09/11/2020  9:33 AM  Sign when Signing Visit Nursing Pain Medication Assessment:  Safety precautions to be maintained  throughout the outpatient stay will include: orient to surroundings, keep bed in low position, maintain call bell within reach at all times, provide assistance with transfer out of bed and ambulation.  Medication Inspection Compliance: Pill count conducted under aseptic conditions, in front of the patient. Neither the pills nor the bottle was removed from the patient's sight at any time. Once count was completed pills were immediately returned to the patient in their original bottle.  Medication: Oxycodone IR Pill/Patch Count:  58 of 120 pills remain Pill/Patch Appearance: Markings consistent with prescribed medication Bottle Appearance: Standard pharmacy container. Clearly labeled. Filled Date: 07 / 25 / 2022 Last Medication intake:  Today    UDS:  Summary  Date Value Ref Range Status  06/10/2020 Note  Final    Comment:    ==================================================================== [employer] 13 (MW) ==================================================================== Specimen Alert Note: Urinary creatinine is low; ability to detect some drugs may be compromised. Interpret results with caution. (Creatinine) ==================================================================== Test                             Result       Flag       Units  Drug Present and Declared for Prescription Verification   Oxycodone                      2932         EXPECTED   ng/mg creat   Noroxycodone                   3895         EXPECTED   ng/mg creat    Sources of oxycodone include scheduled prescription medications.    Noroxycodone is an expected metabolite of oxycodone.  ==================================================================== Test                      Result    Flag   Units      Ref Range   Creatinine              19        LL     mg/dL      >=20 ==================================================================== Declared Medications:  The flagging and interpretation on  this report are based on the  following declared medications.  Unexpected results may arise from  inaccuracies in the declared medications.   **Note: The testing scope of this panel includes these medications:   Oxycodone   **Note: The testing scope of this panel does not include the  following reported medications:   Biotin  Bupropion (Wellbutrin)  Calcium  Citalopram  Cyclobenzaprine  Epinephrine (EpiPen)  Famotidine  Fluoride  Hydroxychloroquine  Ibuprofen  Levonorgestrel  Levothyroxine  Magnesium  Multivitamin  Naloxone  Potassium Chloride  Vitamin D  Vitamin D3  Zinc ==================================================================== For clinical consultation, please call [phone]. ====================================================================      ROS  Constitutional: Denies any fever or chills Gastrointestinal: No reported hemesis, hematochezia, vomiting, or acute GI distress Musculoskeletal: Denies any acute onset joint swelling, redness, loss of ROM, or weakness Neurological: No reported episodes of acute onset apraxia, aphasia, dysarthria, agnosia, amnesia, paralysis, loss of coordination, or loss of consciousness  Medication Review  Biotin, Calcium Carbonate-Vitamin D, EPINEPHrine, Magnesium, Multi-Vitamins, Oxycodone HCl, Sodium Fluoride, Vitamin D3, Zinc, buPROPion, citalopram, cyclobenzaprine, famotidine, hydroxychloroquine, ibuprofen, levonorgestrel, levothyroxine, naloxone, and potassium chloride SA  History Review  Allergy: [name] is allergic to bee  venom, penicillins, shellfish allergy, sulfa antibiotics, erythromycin, zaleplon, codeine, and trazodone. Drug: [name]  reports no history of drug use. Alcohol:  reports no history of alcohol use. Tobacco:  reports that she has quit smoking. She has never used smokeless tobacco. Social: [name]  reports that she has quit smoking. She has never used smokeless tobacco. She reports  that she does not drink alcohol and does not use drugs. Medical:  has a past medical history of Acid reflux (12/15/2011), Anxiety, generalized (12/15/2011), Arthritis, Cervical pain (12/15/2011), Depression, GERD (gastroesophageal reflux disease), Hiatal hernia, LBP (low back pain) (12/15/2011), Migraine, Muscle ache (12/15/2011), Osteopenia of left hip, Pain (12/27/2012), Rheumatoid arthritis ([place]) (12/15/2011), and Scratched cornea (10/25/2015). Surgical: Ms. Kosch  has a past surgical history that includes Cholecystectomy. Family: family history includes Depression in her mother; Diabetes in her father; Hyperlipidemia in her mother; Hypertension in her father and mother.  Laboratory Chemistry Profile   Renal Lab Results  Component Value Date   BUN 6 03/03/2018   CREATININE 0.63 03/03/2018   BCR 10 03/03/2018   GFRAA 123 03/03/2018   GFRNONAA 106 03/03/2018    Hepatic Lab Results  Component Value Date   AST 21 03/03/2018   ALT 26 03/27/2015   ALBUMIN 4.6 03/03/2018   ALKPHOS 71 03/03/2018    Electrolytes Lab Results  Component Value Date   NA 136 03/03/2018   K 3.9 03/03/2018   CL 97 03/03/2018   CALCIUM 8.9 03/03/2018   MG 2.1 03/03/2018    Bone Lab Results  Component Value Date   VD25OH 29.3 (L) 12/31/2014    Inflammation (CRP: Acute Phase) (ESR: Chronic Phase) Lab Results  Component Value Date   CRP <1 03/03/2018   ESRSEDRATE 2 03/03/2018         Note: Above Lab results reviewed.  Recent Imaging Review  DG Lumbar Spine Complete W/Bend CLINICAL DATA:  Chronic pain, no injury Please comment in the report about spinal instability (>4 mm displacement), in addition to any: Spondylolisthesis (specify retro- or antero- and Grade level) (describe displacement in millimeters )  EXAM: LUMBAR SPINE - COMPLETE WITH BENDING VIEWS  COMPARISON:  None.  FINDINGS: There is mild degenerative change in the facet joints of the lower lumbar spine. No acute fracture or  subluxation. Surgical clips are noted in the right upper quadrant the abdomen. Intrauterine device overlies the central pelvis.  IMPRESSION: Mild lower lumbar degenerative changes.  No spondylolisthesis.  Electronically Signed   By: Nolon Nations M.D.   On: 02/18/2017 15:47 DG Thoracic Spine 2 View CLINICAL DATA:  Chronic pain, no injury Please comment in the report about spinal instability (>4 mm displacement), in addition to any: Spondylolisthesis (specify retro- or antero- and Grade level) (describe displacement in millimeters);  EXAM: THORACIC SPINE 2 VIEWS  COMPARISON:  None.  FINDINGS: There is mild S shaped scoliosis of the thoracic spine not associated with vertebral body anomalies. Mild degenerative changes are seen in the thoracic spine. No spondylolisthesis. Mild anterior wedge morphology noted at T11 is of indeterminate age. No acute fracture.  IMPRESSION: Mild midthoracic spondylosis.  No spondylolisthesis.  Mild anterior wedging of T11, age indeterminate.  Electronically Signed   By: Nolon Nations M.D.   On: 02/18/2017 15:44 Note: Reviewed        Physical Exam  General appearance: Well nourished, well developed, and well hydrated. In no apparent acute distress Mental status: Alert, oriented x 3 (person, place, & time)  Respiratory: No evidence of acute respiratory distress Eyes: PERLA Vitals: BP (!) 146/98 (BP Location: Right Arm, Patient Position: Sitting, Cuff Size: Normal)   Pulse (!) 105   Temp 98.4 F (36.9 C)   Resp 16   Ht 5' 6"  (1.676 m)   Wt 175 lb (79.4 kg)   SpO2 100%   BMI 28.25 kg/m  BMI: Estimated body mass index is 28.25 kg/m as calculated from the following:   Height as of this encounter: 5' 6"  (1.676 m).   Weight as of this encounter: 175 lb (79.4 kg). Ideal: Ideal body weight: 59.3 kg (130 lb 11.7 oz) Adjusted ideal body weight: 67.3 kg (148 lb 7 oz)  Assessment   Status Diagnosis   Controlled Controlled Controlled 1. Chronic pain syndrome   2. Chronic low back pain (Primary Area of Pain) (Bilateral) (R>L)   3. Chronic knee pain (Secondary area of Pain) (Bilateral) (R>L)   4. Chronic pain of both shoulders (Bilateral) (R>L)   5. Chronic musculoskeletal pain   6. Chronic neck pain   7. Cervicogenic headache   8. Failed back surgical syndrome   9. Rheumatoid arthritis involving multiple sites with positive rheumatoid factor (HCC)   10. Pharmacologic therapy   11. Chronic use of opiate for therapeutic purpose   12. Encounter for medication management      Updated Problems: No problems updated.  Plan of Care  Problem-specific:  No problem-specific Assessment & Plan notes found for this encounter.  Ms. Ladawn Holly Spears has a current medication list which includes the following long-term medication(s): bupropion, calcium carbonate-vitamin d, citalopram, cyclobenzaprine, famotidine, levonorgestrel, levothyroxine, [START ON 02/18/2021] naloxone, [START ON 09/25/2020] oxycodone hcl, [START ON 10/25/2020] oxycodone hcl, [START ON 11/24/2020] oxycodone hcl, and [START ON 12/24/2020] oxycodone hcl.  Pharmacotherapy (Medications Ordered): Meds ordered this encounter  Medications   Oxycodone HCl 10 MG TABS    Sig: Take 1 tablet (10 mg total) by mouth every 6 (six) hours as needed. Must last 30 days    Dispense:  120 tablet    Refill:  0    Not a duplicate. Do NOT delete! Dispense 1 day early if closed on refill date. Avoid benzodiazepines within 8 hours of opioids. Do not send refill requests.   Oxycodone HCl 10 MG TABS    Sig: Take 1 tablet (10 mg total) by mouth every 6 (six) hours as needed. Must last 30 days    Dispense:  120 tablet    Refill:  0    Not a duplicate. Do NOT delete! Dispense 1 day early if closed on refill date. Avoid benzodiazepines within 8 hours of opioids. Do not send refill requests.   Oxycodone HCl 10 MG TABS    Sig: Take 1 tablet (10 mg total) by  mouth every 6 (six) hours as needed. Must last 30 days    Dispense:  120 tablet    Refill:  0    Not a duplicate. Do NOT delete! Dispense 1 day early if closed on refill date. Avoid benzodiazepines within 8 hours of opioids. Do not send refill requests.   Oxycodone HCl 10 MG TABS    Sig: Take 1 tablet (10 mg total) by mouth every 6 (six) hours as needed. Must last 30 days    Dispense:  120 tablet    Refill:  0    Not a duplicate. Do NOT delete! Dispense 1 day early if closed on refill date. Avoid benzodiazepines within 8 hours of opioids. Do not send  refill requests.    Orders:  No orders of the defined types were placed in this encounter.  Follow-up plan:   Return in about 4 months (around 01/23/2021) for (M,W) (F2F) (MM).     Interventional therapies: Planned, scheduled, and/or pending:   The patient apparently had a bad experience with another physician receiving blocks.    Considering:   Diagnostic bilateral lumbar facet block  Possible bilateral lumbar facet RFA  Diagnostic right L5-S1 LESI  Possible right L5 vs S1 TFESI  Diagnostic bilateral IA knee injections (w/ steroid)  Possible series of 5 IA Hyalgan knee injections  Diagnostic bilateral Genicular NB  Possible bilateral Genicular nerve RFA    Palliative PRN treatment(s):   At this point, we have done no injections for this patient.     Recent Visits No visits were found meeting these conditions. Showing recent visits within past 90 days and meeting all other requirements Today's Visits Date Type Provider Dept  09/11/20 Office Visit Milinda Pointer, MD [facility]  Showing today's visits and meeting all other requirements Future Appointments No visits were found meeting these conditions. Showing future appointments within next 90 days and meeting all other requirements I discussed the assessment and treatment plan with the patient. The patient was provided an opportunity to ask questions and all  were answered. The patient agreed with the plan and demonstrated an understanding of the instructions.  Patient advised to call back or seek an in-person evaluation if the symptoms or condition worsens.  Duration of encounter: 30 minutes.  Note by: Gaspar Cola, MD Date: 09/11/2020; Time: 11:09 AM

## 2020-09-11 ENCOUNTER — Encounter: Payer: Self-pay | Admitting: Pain Medicine

## 2020-09-11 ENCOUNTER — Other Ambulatory Visit: Payer: Self-pay

## 2020-09-11 ENCOUNTER — Ambulatory Visit: Payer: 59 | Attending: Pain Medicine | Admitting: Pain Medicine

## 2020-09-11 VITALS — BP 146/98 | HR 105 | Temp 98.4°F | Resp 16 | Ht 66.0 in | Wt 175.0 lb

## 2020-09-11 DIAGNOSIS — Z79899 Other long term (current) drug therapy: Secondary | ICD-10-CM | POA: Insufficient documentation

## 2020-09-11 DIAGNOSIS — Z79891 Long term (current) use of opiate analgesic: Secondary | ICD-10-CM | POA: Insufficient documentation

## 2020-09-11 DIAGNOSIS — M25511 Pain in right shoulder: Secondary | ICD-10-CM | POA: Diagnosis not present

## 2020-09-11 DIAGNOSIS — G8929 Other chronic pain: Secondary | ICD-10-CM | POA: Diagnosis present

## 2020-09-11 DIAGNOSIS — M542 Cervicalgia: Secondary | ICD-10-CM

## 2020-09-11 DIAGNOSIS — M5442 Lumbago with sciatica, left side: Secondary | ICD-10-CM | POA: Insufficient documentation

## 2020-09-11 DIAGNOSIS — M0579 Rheumatoid arthritis with rheumatoid factor of multiple sites without organ or systems involvement: Secondary | ICD-10-CM | POA: Diagnosis present

## 2020-09-11 DIAGNOSIS — M961 Postlaminectomy syndrome, not elsewhere classified: Secondary | ICD-10-CM | POA: Insufficient documentation

## 2020-09-11 DIAGNOSIS — G4486 Cervicogenic headache: Secondary | ICD-10-CM

## 2020-09-11 DIAGNOSIS — M25512 Pain in left shoulder: Secondary | ICD-10-CM | POA: Diagnosis present

## 2020-09-11 DIAGNOSIS — G894 Chronic pain syndrome: Secondary | ICD-10-CM | POA: Diagnosis present

## 2020-09-11 DIAGNOSIS — M25561 Pain in right knee: Secondary | ICD-10-CM | POA: Diagnosis not present

## 2020-09-11 DIAGNOSIS — M5441 Lumbago with sciatica, right side: Secondary | ICD-10-CM | POA: Diagnosis present

## 2020-09-11 DIAGNOSIS — M7918 Myalgia, other site: Secondary | ICD-10-CM | POA: Diagnosis present

## 2020-09-11 DIAGNOSIS — M25562 Pain in left knee: Secondary | ICD-10-CM | POA: Diagnosis present

## 2020-09-11 MED ORDER — OXYCODONE HCL 10 MG PO TABS
10.0000 mg | ORAL_TABLET | Freq: Four times a day (QID) | ORAL | 0 refills | Status: DC | PRN
Start: 2020-12-24 — End: 2021-01-15

## 2020-09-11 MED ORDER — OXYCODONE HCL 10 MG PO TABS: 10.0000 mg | ORAL_TABLET | Freq: Four times a day (QID) | ORAL | 0 refills | Status: DC | PRN

## 2020-09-11 NOTE — Progress Notes (Signed)
Nursing Pain Medication Assessment:  Safety precautions to be maintained throughout the outpatient stay will include: orient to surroundings, keep bed in low position, maintain call bell within reach at all times, provide assistance with transfer out of bed and ambulation.  Medication Inspection Compliance: Pill count conducted under aseptic conditions, in front of the patient. Neither the pills nor the bottle was removed from the patient's sight at any time. Once count was completed pills were immediately returned to the patient in their original bottle.  Medication: Oxycodone IR Pill/Patch Count:  58 of 120 pills remain Pill/Patch Appearance: Markings consistent with prescribed medication Bottle Appearance: Standard pharmacy container. Clearly labeled. Filled Date: 07 / 25 / 2022 Last Medication intake:  Today

## 2020-09-11 NOTE — Patient Instructions (Signed)
Post-op Pain Management on a Chronic Pain Patient  Why should the surgeon manage his patient's post-op pain? The Surgeon is uniquely qualified to determine the amount of post-operative pain to be expected on a procedure or surgery that he/she has performed. Even with similar surgeries, the surgeon's perspective on expected pain is unique, since he/she performed the procedure and knows the degree of difficulty and/or tissue damage involved in each particular case. The surgeon is also up to date on events such as blood loss, intraoperative complications, and PO (per orum = Oral) status that may influence not only the patient's dose and schedule, but route of administration as well.  How about telling chronic pain patients to just double up or increase their usual pain medication intake to compensate for the increased pain? ABSOLUTELY NOT. This is a bad idea since it will lead to several problems: The patient running out of his/her usual medications early and this may create a problem at the level of the insurance, which allows supplies of medications based on the amount and schedule stated on the prescription. Running out early may trigger an event where the refill is denied by the insurance company and/or pharmacy, as they will interpret it as an "early refill", and therefore deny it.  This is also a bad idea since it will throw off pill counts required during follow-up monitoring. This practice provides a very poor paper trail as to why this patient ran out of medication early. From the perspective of the pain physician, it creates a nightmare in the accounting of the patient's medication. In addition, it will cause the patient to run out of pills early. From the perspective of the patient, since we do not allow early refills, the patient will run out of medicine probably triggering withdrawals. Getting an early appointment at our pain facility may also prove to be difficult since available appointments  are very difficult to come by.  So, what should I do when confronted with an upcomming surgery and I already take pain medicine on a regular basis?  This is what the surgeon should do: They should not change the dose or schedule of the pain medications prescribed by the pain specialist. This medication regimen allows for the patient's chronic pain to be under control, so as to bring that patient down to the level of an average individual. Have the patient continue their usual pain management regimen, without any alterations. In addition, manage the post-operative pain as they would on any other "narcotic naive" patient (patient that does not take medication). They should not attempt to compensate for tolerance. This is what the patient's usual regimen will do for them. Simply treat the patient as if they had no chronic pain and as if they were not taking any other pain medications. They should talk to the patient about the pain medication, just like you would for anyone else. Do not assume that patients are experts in opioids. Make sure they let the patient know that the medication is to be used only if absolutely necessary. (PRN) Prescribe the medication for as long as they usually on any other patient undergoing the same type of surgery (usually less than 30 days). They should not prescribe for longer than 30 days, unless there is a post-operative complication. They should send [place] a copy of the operative report with information about their choice of the post-op pain medication provided. They should keep [place] informed of any complications that may prolong the average duration patient's post-op pain.  If you have any questions, please feel to contact us at [phone]. _____________________________________________________________________________________________  ____________________________________________________________________________________________  Medication Rules  Purpose: To inform  patients, and their family members, of our rules and regulations.  Applies to: All patients receiving prescriptions (written or electronic).  Pharmacy of record: Pharmacy where electronic prescriptions will be sent. If written prescriptions are taken to a different pharmacy, please inform the nursing staff. The pharmacy listed in the electronic medical record should be the one where you would like electronic prescriptions to be sent.  Electronic prescriptions: In compliance with the [facility] Strengthen Opioid Misuse Prevention (STOP) Act of 2017 (Session Conni Elliot [phone]), effective February 02, 2018, all controlled substances must be electronically prescribed. Calling prescriptions to the pharmacy will cease to exist.  Prescription refills: Only during scheduled appointments. Applies to all prescriptions.  NOTE: The following applies primarily to controlled substances (Opioid* Pain Medications).   Type of encounter (visit): For patients receiving controlled substances, face-to-face visits are required. (Not an option or up to the patient.)  Patient's responsibilities: Pain Pills: Bring all pain pills to every appointment (except for procedure appointments). Pill Bottles: Bring pills in original pharmacy bottle. Always bring the newest bottle. Bring bottle, even if empty. Medication refills: You are responsible for knowing and keeping track of what medications you take and those you need refilled. The day before your appointment: write a list of all prescriptions that need to be refilled. The day of the appointment: give the list to the admitting nurse. Prescriptions will be written only during appointments. No prescriptions will be written on procedure days. If you forget a medication: it will not be "Called in", "Faxed", or "electronically sent". You will need to get another appointment to get these prescribed. No early refills. Do not call asking to have your prescription filled  early. Prescription Accuracy: You are responsible for carefully inspecting your prescriptions before leaving our office. Have the discharge nurse carefully go over each prescription with you, before taking them home. Make sure that your name is accurately spelled, that your address is correct. Check the name and dose of your medication to make sure it is accurate. Check the number of pills, and the written instructions to make sure they are clear and accurate. Make sure that you are given enough medication to last until your next medication refill appointment. Taking Medication: Take medication as prescribed. When it comes to controlled substances, taking less pills or less frequently than prescribed is permitted and encouraged. Never take more pills than instructed. Never take medication more frequently than prescribed.  Inform other Doctors: Always inform, all of your healthcare providers, of all the medications you take. Pain Medication from other Providers: You are not allowed to accept any additional pain medication from any other Doctor or Healthcare provider. There are two exceptions to this rule. (see below) In the event that you require additional pain medication, you are responsible for notifying us, as stated below. Cough Medicine: Often these contain an opioid, such as codeine or hydrocodone. Never accept or take cough medicine containing these opioids if you are already taking an opioid* medication. The combination may cause respiratory failure and death. Medication Agreement: You are responsible for carefully reading and following our Medication Agreement. This must be signed before receiving any prescriptions from our practice. Safely store a copy of your signed Agreement. Violations to the Agreement will result in no further prescriptions. (Additional copies of our Medication Agreement are available upon request.) Laws, Rules, & Regulations: All patients  are expected to follow all [address]  and [employer], Rules, [place] [employer]. Ignorance of the Laws does not constitute a valid excuse.  Illegal drugs and Controlled Substances: The use of illegal substances (including, but not limited to marijuana and its derivatives) and/or the illegal use of any controlled substances is strictly prohibited. Violation of this rule may result in the immediate and permanent discontinuation of any and all prescriptions being written by our practice. The use of any illegal substances is prohibited. Adopted CDC guidelines & recommendations: Target dosing levels will be at or below 60 MME/day. Use of benzodiazepines** is not recommended.  Exceptions: There are only two exceptions to the rule of not receiving pain medications from other Healthcare Providers. Exception #1 (Emergencies): In the event of an emergency (i.e.: accident requiring emergency care), you are allowed to receive additional pain medication. However, you are responsible for: As soon as you are able, call our office [phone], at any time of the day or night, and leave a message stating your name, the date and nature of the emergency, and the name and dose of the medication prescribed. In the event that your call is answered by a member of our staff, make sure to document and save the date, time, and the name of the person that took your information.  Exception #2 (Planned Surgery): In the event that you are scheduled by another doctor or dentist to have any type of surgery or procedure, you are allowed (for a period no longer than 30 days), to receive additional pain medication, for the acute post-op pain. However, in this case, you are responsible for picking up a copy of our "Post-op Pain Management for Surgeons" handout, and giving it to your surgeon or dentist. This document is available at our office, and does not require an appointment to obtain it. Simply go to our office during business hours (Monday-Thursday from 8:00 AM to 4:00  PM) (Friday 8:00 AM to 12:00 Noon) or if you have a scheduled appointment with [place], prior to your surgery, and ask for it by name. In addition, you are responsible for: calling our office [phone], at any time of the day or night, and leaving a message stating your name, name of your surgeon, type of surgery, and date of procedure or surgery. Failure to comply with your responsibilities may result in termination of therapy involving the controlled substances.  *Opioid medications include: morphine, codeine, oxycodone, oxymorphone, hydrocodone, hydromorphone, meperidine, tramadol, tapentadol, buprenorphine, fentanyl, methadone. **Benzodiazepine medications include: diazepam (Valium), alprazolam (Xanax), clonazepam (Klonopine), lorazepam (Ativan), clorazepate (Tranxene), chlordiazepoxide (Librium), estazolam (Prosom), oxazepam (Serax), temazepam (Restoril), triazolam (Halcion) (Last updated: 01/01/2020) ____________________________________________________________________________________________  ____________________________________________________________________________________________  Medication Recommendations and Reminders  Applies to: All patients receiving prescriptions (written and/or electronic).  Medication Rules & Regulations: These rules and regulations exist for your safety and that of others. They are not flexible and neither are we. Dismissing or ignoring them will be considered "non-compliance" with medication therapy, resulting in complete and irreversible termination of such therapy. (See document titled "Medication Rules" for more details.) In all conscience, because of safety reasons, we cannot continue providing a therapy where the patient does not follow instructions.  Pharmacy of record:  Definition: This is the pharmacy where your electronic prescriptions will be sent.  We do not endorse any particular pharmacy, however, we have experienced problems with [facility] not  securing enough medication supply for the community. We do not restrict you in your choice of pharmacy. However, once we  write for your prescriptions, we will NOT be re-sending more prescriptions to fix restricted supply problems created by your pharmacy, or your insurance.  The pharmacy listed in the electronic medical record should be the one where you want electronic prescriptions to be sent. If you choose to change pharmacy, simply notify our nursing staff.  Recommendations: Keep all of your pain medications in a safe place, under lock and key, even if you live alone. We will NOT replace lost, stolen, or damaged medication. After you fill your prescription, take 1 week's worth of pills and put them away in a safe place. You should keep a separate, properly labeled bottle for this purpose. The remainder should be kept in the original bottle. Use this as your primary supply, until it runs out. Once it's gone, then you know that you have 1 week's worth of medicine, and it is time to come in for a prescription refill. If you do this correctly, it is unlikely that you will ever run out of medicine. To make sure that the above recommendation works, it is very important that you make sure your medication refill appointments are scheduled at least 1 week before you run out of medicine. To do this in an effective manner, make sure that you do not leave the office without scheduling your next medication management appointment. Always ask the nursing staff to show you in your prescription , when your medication will be running out. Then arrange for the receptionist to get you a return appointment, at least 7 days before you run out of medicine. Do not wait until you have 1 or 2 pills left, to come in. This is very poor planning and does not take into consideration that we may need to cancel appointments due to bad weather, sickness, or emergencies affecting our staff. DO NOT ACCEPT A "Partial Fill": If for any  reason your pharmacy does not have enough pills/tablets to completely fill or refill your prescription, do not allow for a "partial fill". The law allows the pharmacy to complete that prescription within 72 hours, without requiring a new prescription. If they do not fill the rest of your prescription within those 72 hours, you will need a separate prescription to fill the remaining amount, which we will NOT provide. If the reason for the partial fill is your insurance, you will need to talk to the pharmacist about payment alternatives for the remaining tablets, but again, DO NOT ACCEPT A PARTIAL FILL, unless you can trust your pharmacist to obtain the remainder of the pills within 72 hours.  Prescription refills and/or changes in medication(s):  Prescription refills, and/or changes in dose or medication, will be conducted only during scheduled medication management appointments. (Applies to both, written and electronic prescriptions.) No refills on procedure days. No medication will be changed or started on procedure days. No changes, adjustments, and/or refills will be conducted on a procedure day. Doing so will interfere with the diagnostic portion of the procedure. No phone refills. No medications will be "called into the pharmacy". No Fax refills. No weekend refills. No Holliday refills. No after hours refills.  Remember:  Business hours are:  Monday to Thursday 8:00 AM to 4:00 PM Provider's Schedule: Delano Metz, MD - Appointments are:  Medication management: Monday and Wednesday 8:00 AM to 4:00 PM Procedure day: Tuesday and Thursday 7:30 AM to 4:00 PM Edward Jolly, MD - Appointments are:  Medication management: Tuesday and Thursday 8:00 AM to 4:00 PM Procedure day: Monday and Wednesday  7:30 AM to 4:00 PM (Last update: 08/23/2019) ____________________________________________________________________________________________   ____________________________________________________________________________________________  CBD (cannabidiol) WARNING  Applicable to: All individuals currently taking or considering taking CBD (cannabidiol) and, more important, all patients taking opioid analgesic controlled substances (pain medication). (Example: oxycodone; oxymorphone; hydrocodone; hydromorphone; morphine; methadone; tramadol; tapentadol; fentanyl; buprenorphine; butorphanol; dextromethorphan; meperidine; codeine; etc.)  Legal status: CBD remains a Schedule I drug prohibited for any use. CBD is illegal with one exception. In the [place], CBD has a limited Education officer, environmental (FDA) approval for the treatment of two specific types of epilepsy disorders. Only one CBD product has been approved by the FDA for this purpose: "Epidiolex". FDA is aware that some companies are marketing products containing cannabis and cannabis-derived compounds in ways that violate the [employer], Drug and Cosmetic Act [facility] Act) and that may put the health and safety of consumers at risk. The FDA, a Federal agency, has not enforced the CBD status since 2018.   Legality: Some manufacturers ship CBD products nationally, which is illegal. Often such products are sold online and are therefore available throughout the country. CBD is openly sold in head shops and health food stores in some states where such sales have not been explicitly legalized. Selling unapproved products with unsubstantiated therapeutic claims is not only a violation of the law, but also can put patients at risk, as these products have not been proven to be safe or effective. Federal illegality makes it difficult to conduct research on CBD.  Reference: "FDA Regulation of Cannabis and Cannabis-Derived Products, Including Cannabidiol (CBD)" -  [employer]  Warning: CBD is not FDA approved and has not undergo the same manufacturing controls as prescription drugs.  This means that the purity and safety of available CBD may be questionable. Most of the time, despite manufacturer's claims, it is contaminated with THC (delta-9-tetrahydrocannabinol - the chemical in marijuana responsible for the "HIGH").  When this is the case, the [facility] contaminant will trigger a positive urine drug screen (UDS) test for Marijuana (carboxy-THC). Because a positive UDS for any illicit substance is a violation of our medication agreement, your opioid analgesics (pain medicine) may be permanently discontinued.  MORE ABOUT CBD  General Information: CBD  is a derivative of the Marijuana (cannabis sativa) plant discovered in 49. It is one of the 113 identified substances found in Marijuana. It accounts for up to 40% of the plant's extract. As of 2018, preliminary clinical studies on CBD included research for the treatment of anxiety, movement disorders, and pain. CBD is available and consumed in multiple forms, including inhalation of smoke or vapor, as an aerosol spray, and by mouth. It may be supplied as an oil containing CBD, capsules, dried cannabis, or as a liquid solution. CBD is thought not to be as psychoactive as THC (delta-9-tetrahydrocannabinol - the chemical in marijuana responsible for the "HIGH"). Studies suggest that CBD may interact with different biological target receptors in the body, including cannabinoid and other neurotransmitter receptors. As of 2018 the mechanism of action for its biological effects has not been determined.  Side-effects  Adverse reactions: Dry mouth, diarrhea, decreased appetite, fatigue, drowsiness, malaise, weakness, sleep disturbances, and others.  Drug interactions: CBC may interact with other medications  such as blood-thinners. (Last update: 09/09/2019) ____________________________________________________________________________________________  ____________________________________________________________________________________________  Drug Holidays (Slow)  What is a "Drug Holiday"? Drug Holiday: is the name given to the period of time during which a patient stops taking a medication(s) for the purpose of eliminating tolerance to the drug.  Benefits Improved effectiveness of opioids. Decreased opioid dose needed to achieve benefits. Improved pain with lesser dose.  What is tolerance? Tolerance: is the progressive decreased in effectiveness of a drug due to its repetitive use. With repetitive use, the body gets use to the medication and as a consequence, it loses its effectiveness. This is a common problem seen with opioid pain medications. As a result, a larger dose of the drug is needed to achieve the same effect that used to be obtained with a smaller dose.  How long should a "Drug Holiday" last? You should stay off of the pain medicine for at least 14 consecutive days. (2 weeks)  Should I stop the medicine "cold [place]"? No. You should always coordinate with your Pain Specialist so that he/she can provide you with the correct medication dose to make the transition as smoothly as possible.  How do I stop the medicine? Slowly. You will be instructed to decrease the daily amount of pills that you take by one (1) pill every seven (7) days. This is called a "slow downward taper" of your dose. For example: if you normally take four (4) pills per day, you will be asked to drop this dose to three (3) pills per day for seven (7) days, then to two (2) pills per day for seven (7) days, then to one (1) per day for seven (7) days, and at the end of those last seven (7) days, this is when the "Drug Holiday" would start.   Will I have withdrawals? By doing a "slow downward taper" like this one, it  is unlikely that you will experience any significant withdrawal symptoms. Typically, what triggers withdrawals is the sudden stop of a high dose opioid therapy. Withdrawals can usually be avoided by slowly decreasing the dose over a prolonged period of time. If you do not follow these instructions and decide to stop your medication abruptly, withdrawals may be possible.  What are withdrawals? Withdrawals: refers to the wide range of symptoms that occur after stopping or dramatically reducing opiate drugs after heavy and prolonged use. Withdrawal symptoms do not occur to patients that use low dose opioids, or those who take the medication sporadically. Contrary to benzodiazepine (example: Valium, Xanax, etc.) or alcohol withdrawals ("Delirium Tremens"), opioid withdrawals are not lethal. Withdrawals are the physical manifestation of the body getting rid of the excess receptors.  Expected Symptoms Early symptoms of withdrawal may include: Agitation Anxiety Muscle aches Increased tearing Insomnia Runny nose Sweating Yawning  Late symptoms of withdrawal may include: Abdominal cramping Diarrhea Dilated pupils Goose bumps Nausea Vomiting  Will I experience withdrawals? Due to the slow nature of the taper, it is very unlikely that you will experience any.  What is a slow taper? Taper: refers to the gradual decrease in dose.  (Last update: 08/23/2019) ____________________________________________________________________________________________

## 2021-01-15 ENCOUNTER — Other Ambulatory Visit: Payer: Self-pay

## 2021-01-15 ENCOUNTER — Encounter: Payer: Self-pay | Admitting: Pain Medicine

## 2021-01-15 ENCOUNTER — Ambulatory Visit: Payer: 59 | Attending: Pain Medicine | Admitting: Pain Medicine

## 2021-01-15 VITALS — BP 138/87 | HR 70 | Temp 97.2°F | Resp 16 | Ht 67.0 in | Wt 170.0 lb

## 2021-01-15 DIAGNOSIS — M79605 Pain in left leg: Secondary | ICD-10-CM

## 2021-01-15 DIAGNOSIS — G8929 Other chronic pain: Secondary | ICD-10-CM | POA: Insufficient documentation

## 2021-01-15 DIAGNOSIS — M961 Postlaminectomy syndrome, not elsewhere classified: Secondary | ICD-10-CM | POA: Insufficient documentation

## 2021-01-15 DIAGNOSIS — M25511 Pain in right shoulder: Secondary | ICD-10-CM | POA: Insufficient documentation

## 2021-01-15 DIAGNOSIS — Z79899 Other long term (current) drug therapy: Secondary | ICD-10-CM | POA: Insufficient documentation

## 2021-01-15 DIAGNOSIS — Z79891 Long term (current) use of opiate analgesic: Secondary | ICD-10-CM | POA: Diagnosis present

## 2021-01-15 DIAGNOSIS — M79604 Pain in right leg: Secondary | ICD-10-CM | POA: Insufficient documentation

## 2021-01-15 DIAGNOSIS — G894 Chronic pain syndrome: Secondary | ICD-10-CM | POA: Diagnosis present

## 2021-01-15 DIAGNOSIS — M545 Low back pain, unspecified: Secondary | ICD-10-CM | POA: Diagnosis present

## 2021-01-15 DIAGNOSIS — M25561 Pain in right knee: Secondary | ICD-10-CM | POA: Diagnosis present

## 2021-01-15 DIAGNOSIS — M7918 Myalgia, other site: Secondary | ICD-10-CM | POA: Diagnosis present

## 2021-01-15 DIAGNOSIS — M542 Cervicalgia: Secondary | ICD-10-CM | POA: Diagnosis present

## 2021-01-15 DIAGNOSIS — M25512 Pain in left shoulder: Secondary | ICD-10-CM

## 2021-01-15 DIAGNOSIS — M25562 Pain in left knee: Secondary | ICD-10-CM | POA: Diagnosis present

## 2021-01-15 MED ORDER — OXYCODONE HCL 10 MG PO TABS: 10.0000 mg | ORAL_TABLET | Freq: Four times a day (QID) | ORAL | 0 refills | Status: DC | PRN

## 2021-01-15 NOTE — Progress Notes (Signed)
PROVIDER NOTE: Information contained herein reflects review and annotations entered in association with encounter. Interpretation of such information and data should be left to medically-trained personnel. Information provided to patient can be located elsewhere in the medical record under "Patient Instructions". Document created using STT-dictation technology, any transcriptional errors that may result from process are unintentional.    Patient: Holly Spears  Service Category: E/M  Provider: Gaspar Cola, MD  DOB: 1970/12/16  DOS: 01/15/2021  Specialty: Interventional Pain Management  MRN: [id number]  Setting: Ambulatory outpatient  PCP: Lonzo Cloud, PA-C  Type: Established Patient    Referring Provider: Lonzo Cloud, PA-C  Location: Office  Delivery: Face-to-face     HPI  Ms. Burgundy Matuszak, a 50 y.o. year old female, is here today because of her Chronic pain syndrome [G89.4]. Ms. Bigford's primary complain today is Back Pain (lower) Last encounter: My last encounter with her was on 09/11/2020. Pertinent problems: Ms. 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; Lumbar spondylosis (L3-4, L4-5, and L5-S1); Lumbar bulging disc (L3-4, L4-5, and L5-S1); Chronic lumbar radicular pain (Bilateral) (R>L); Chronic knee pain (3ry area of Pain) (Bilateral) (R>L); Chronic upper back pain (Left); Thoracic disc herniation (Large, Left paracentral T10-11 disc herniation);  Thoracic spinal stenosis (Severe, Left, T10-11 Lateral Recess Stenosis); Thoracic foraminal stenosis (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 Pain Assessment: Severity of Chronic pain is reported as a 1 /10. Location: Back Lower/Pain radiaities down right leg to her heel. Onset: More than a month ago. Quality: Aching, Constant. Timing: Constant. Modifying factor(s): meds heating pad. Vitals:  height is 5' 7" (1.702 m) and weight is 170 lb (77.1 kg). Her temperature is 97.2 F (36.2 C) (abnormal). Her blood pressure is 138/87 and her pulse is 70. Her respiration is 16 and oxygen saturation is 98%.   Reason for encounter: medication management.  According to the PMP, the patient received a prescription for oxycodone IR 5 mg tablets # 25 on 10/05/2020 from [facility].  The patient had a total hysterectomy on October 08, 2020 for which this additional oxycodone IR was written.  The same is repeated on 11/23/2020 but for oxycodone IR 10 mg tablets #120.  Upon furthering examining the situation and having spoken to the patient, it would seem that the pharmacy made a mistake in logging in my fourth prescription from the 09/11/2020 visit.  It would appear that they locked it in as coming from [facility], instead of being my prescription.  The patient denies having received this a second prescription for 120 tablets.  We will be contacting her pharmacy to clarify this issue.   The patient indicates doing well with the current medication regimen. No adverse reactions or side effects reported to the medications.   RTCB: 04/23/2021 Nonopioids transferred 11/22/2019: Flexeril  Pharmacotherapy Assessment  Analgesic: Oxycodone IR  10  mg, 1 tab PO q 6 hrs (40 mg/day of oxycodone) MME/day: 60 mg/day.   Monitoring: [place] PMP: PDMP reviewed during this encounter.       Pharmacotherapy: No side-effects or adverse reactions reported. Compliance: No problems identified. Effectiveness: Clinically acceptable.  Chauncey Fischer, RN  01/15/2021  8:54 AM  Sign when Signing Visit Nursing Pain Medication Assessment:  Safety precautions to be maintained throughout the outpatient stay will include: orient to surroundings, keep bed in low position, maintain call bell within reach at all times, provide assistance with transfer out of bed and ambulation.  Medication Inspection Compliance: Pill count conducted under aseptic conditions, in front of the patient. Neither the pills nor the bottle was removed from the patient's sight at any time. Once count was completed pills were immediately returned to the patient in their original bottle.  Medication: Oxycodone IR Pill/Patch Count:  35 of 120 pills remain Pill/Patch Appearance: Markings consistent with prescribed medication Bottle Appearance: Standard pharmacy container. Clearly labeled. Filled Date: 11/12 / 2022 Last Medication intake:  Today Safety precautions to be maintained throughout the outpatient stay will include: orient to surroundings, keep bed in low position, maintain call bell within reach at all times, provide assistance with transfer out of bed and ambulation.      UDS:  Summary  Date Value Ref Range Status  06/10/2020 Note  Final    Comment:    ==================================================================== [employer] Select 13 (MW) ==================================================================== Specimen Alert Note: Urinary creatinine is low; ability to detect some drugs may be compromised. Interpret results with caution. (Creatinine) ==================================================================== Test                             Result       Flag        Units  Drug Present and Declared for Prescription Verification   Oxycodone                      2932         EXPECTED   ng/mg creat   Noroxycodone                   3895         EXPECTED   ng/mg creat    Sources of oxycodone include scheduled prescription medications.    Noroxycodone is an expected metabolite of oxycodone.  ==================================================================== Test                      Result    Flag   Units      Ref Range   Creatinine              19        LL     mg/dL      >=20 ==================================================================== Declared Medications:  The flagging and interpretation on this report are based on the  following declared medications.  Unexpected results may arise from  inaccuracies in the declared medications.   **Note: The testing scope of this panel includes these medications:   Oxycodone   **Note: The testing scope of this panel does not include the  following reported medications:   Biotin  Bupropion (Wellbutrin)  Calcium  Citalopram  Cyclobenzaprine  Epinephrine (EpiPen)  Famotidine  Fluoride  Hydroxychloroquine  Ibuprofen  Levonorgestrel  Levothyroxine  Magnesium  Multivitamin  Naloxone  Potassium Chloride  Vitamin D  Vitamin D3  Zinc ==================================================================== For  clinical consultation, please call [phone]. ====================================================================      ROS  Constitutional: Denies any fever or chills Gastrointestinal: No reported hemesis, hematochezia, vomiting, or acute GI distress Musculoskeletal: Denies any acute onset joint swelling, redness, loss of ROM, or weakness Neurological: No reported episodes of acute onset apraxia, aphasia, dysarthria, agnosia, amnesia, paralysis, loss of coordination, or loss of consciousness  Medication Review  Biotin, Calcium Carbonate-Vitamin D, EPINEPHrine, Magnesium,  Multi-Vitamins, Oxycodone HCl, Sodium Fluoride, Vitamin D3, Zinc, buPROPion, citalopram, cyclobenzaprine, famotidine, hydroxychloroquine, ibuprofen, levonorgestrel, levothyroxine, naloxone, and potassium chloride SA  History Review  Allergy: Ms. Leavens is allergic to bee venom, penicillins, shellfish allergy, sulfa antibiotics, erythromycin, zaleplon, codeine, and trazodone. Drug: Ms. Wire  reports no history of drug use. Alcohol:  reports no history of alcohol use. Tobacco:  reports that she has quit smoking. She has never used smokeless tobacco. Social: Ms. Winberry  reports that she has quit smoking. She has never used smokeless tobacco. She reports that she does not drink alcohol and does not use drugs. Medical:  has a past medical history of Acid reflux (12/15/2011), Anxiety, generalized (12/15/2011), Arthritis, Cervical pain (12/15/2011), Depression, GERD (gastroesophageal reflux disease), Hiatal hernia, LBP (low back pain) (12/15/2011), Migraine, Muscle ache (12/15/2011), Osteopenia of left hip, Pain (12/27/2012), Rheumatoid arthritis ([place]) (12/15/2011), and Scratched cornea (10/25/2015). Surgical: Ms. Elson  has a past surgical history that includes Cholecystectomy and Abdominal hysterectomy. Family: family history includes Depression in her mother; Diabetes in her father; Hyperlipidemia in her mother; Hypertension in her father and mother.  Laboratory Chemistry Profile   Renal Lab Results  Component Value Date   BUN 6 03/03/2018   CREATININE 0.63 03/03/2018   BCR 10 03/03/2018   GFRAA 123 03/03/2018   GFRNONAA 106 03/03/2018    Hepatic Lab Results  Component Value Date   AST 21 03/03/2018   ALT 26 03/27/2015   ALBUMIN 4.6 03/03/2018   ALKPHOS 71 03/03/2018    Electrolytes Lab Results  Component Value Date   NA 136 03/03/2018   K 3.9 03/03/2018   CL 97 03/03/2018   CALCIUM 8.9 03/03/2018   MG 2.1 03/03/2018    Bone Lab Results  Component Value Date   VD25OH 29.3 (L)  12/31/2014    Inflammation (CRP: Acute Phase) (ESR: Chronic Phase) Lab Results  Component Value Date   CRP <1 03/03/2018   ESRSEDRATE 2 03/03/2018         Note: Above Lab results reviewed.  Recent Imaging Review  DG Lumbar Spine Complete W/Bend CLINICAL DATA:  Chronic pain, no injury Please comment in the report about spinal instability (>4 mm displacement), in addition to any: Spondylolisthesis (specify retro- or antero- and Grade level) (describe displacement in millimeters )  EXAM: LUMBAR SPINE - COMPLETE WITH BENDING VIEWS  COMPARISON:  None.  FINDINGS: There is mild degenerative change in the facet joints of the lower lumbar spine. No acute fracture or subluxation. Surgical clips are noted in the right upper quadrant the abdomen. Intrauterine device overlies the central pelvis.  IMPRESSION: Mild lower lumbar degenerative changes.  No spondylolisthesis.  Electronically Signed   By: Nolon Nations M.D.   On: 02/18/2017 15:47 DG Thoracic Spine 2 View CLINICAL DATA:  Chronic pain, no injury Please comment in the report about spinal instability (>4 mm displacement), in addition to any: Spondylolisthesis (specify retro- or antero- and Grade level) (describe displacement in millimeters);  EXAM: THORACIC SPINE 2 VIEWS  COMPARISON:  None.  FINDINGS: There is mild S shaped scoliosis  of the thoracic spine not associated with vertebral body anomalies. Mild degenerative changes are seen in the thoracic spine. No spondylolisthesis. Mild anterior wedge morphology noted at T11 is of indeterminate age. No acute fracture.  IMPRESSION: Mild midthoracic spondylosis.  No spondylolisthesis.  Mild anterior wedging of T11, age indeterminate.  Electronically Signed   By: Nolon Nations M.D.   On: 02/18/2017 15:44 Note: Reviewed        Physical Exam  General appearance: Well nourished, well developed, and well hydrated. In no apparent acute distress Mental status:  Alert, oriented x 3 (person, place, & time)       Respiratory: No evidence of acute respiratory distress Eyes: PERLA Vitals: BP 138/87    Pulse 70    Temp (!) 97.2 F (36.2 C)    Resp 16    Ht 5' 7" (1.702 m)    Wt 170 lb (77.1 kg)    SpO2 98%    BMI 26.63 kg/m  BMI: Estimated body mass index is 26.63 kg/m as calculated from the following:   Height as of this encounter: 5' 7" (1.702 m).   Weight as of this encounter: 170 lb (77.1 kg). Ideal: Ideal body weight: 61.6 kg (135 lb 12.9 oz) Adjusted ideal body weight: 67.8 kg (149 lb 7.7 oz)  Assessment   Status Diagnosis  Controlled Controlled Controlled 1. Chronic pain syndrome   2. Chronic low back pain (1ry area of Pain) (Bilateral) (R>L) w/o sciatica   3. Chronic lower extremity pain (2ry area of Pain) (Bilateral)   4. Chronic knee pain (3ry area of Pain) (Bilateral) (R>L)   5. Chronic neck pain (4th area of Pain)   6. Failed back surgical syndrome   7. Chronic shoulder pain (5th area of Pain) (Bilateral) (R>L)   8. Chronic musculoskeletal pain   9. Pharmacologic therapy   10. Chronic use of opiate for therapeutic purpose   11. Encounter for chronic pain management   12. Encounter for medication management      Updated Problems: Problem  Chronic lower extremity pain (2ry area of Pain) (Bilateral)  Chronic neck pain (4th area of Pain)  Chronic shoulder pain (5th area of Pain) (Bilateral) (R>L)  Chronic low back pain (1ry area of Pain) (Bilateral) (R>L) w/o sciatica  Chronic knee pain (3ry area of Pain) (Bilateral) (R>L)    Plan of Care  Problem-specific:  No problem-specific Assessment & Plan notes found for this encounter.  Ms. Anyeli Hockenbury has a current medication list which includes the following long-term medication(s): calcium carbonate-vitamin d, citalopram, levonorgestrel, levothyroxine, [START ON 02/18/2021] naloxone, bupropion, cyclobenzaprine, famotidine, [START ON 01/23/2021] oxycodone hcl, [START ON 02/22/2021]  oxycodone hcl, and [START ON 03/24/2021] oxycodone hcl.  Pharmacotherapy (Medications Ordered): Meds ordered this encounter  Medications   Oxycodone HCl 10 MG TABS    Sig: Take 1 tablet (10 mg total) by mouth every 6 (six) hours as needed. Must last 30 days    Dispense:  120 tablet    Refill:  0    DO NOT: delete (not duplicate); no partial-fill (will deny script to complete), no refill request (F/U required). DISPENSE: 1 day early if closed on fill date. WARN: No CNS-depressants within 8 hrs of med.   Oxycodone HCl 10 MG TABS    Sig: Take 1 tablet (10 mg total) by mouth every 6 (six) hours as needed. Must last 30 days    Dispense:  120 tablet    Refill:  0    DO NOT:  delete (not duplicate); no partial-fill (will deny script to complete), no refill request (F/U required). DISPENSE: 1 day early if closed on fill date. WARN: No CNS-depressants within 8 hrs of med.   Oxycodone HCl 10 MG TABS    Sig: Take 1 tablet (10 mg total) by mouth every 6 (six) hours as needed. Must last 30 days    Dispense:  120 tablet    Refill:  0    DO NOT: delete (not duplicate); no partial-fill (will deny script to complete), no refill request (F/U required). DISPENSE: 1 day early if closed on fill date. WARN: No CNS-depressants within 8 hrs of med.    Orders:  No orders of the defined types were placed in this encounter.  Follow-up plan:   Return in about 14 weeks (around 04/23/2021) for Eval-day (M,W), (F2F), (MM).     Interventional therapies: Planned, scheduled, and/or pending:   The patient apparently had a bad experience with another physician receiving blocks.    Considering:   Diagnostic bilateral lumbar facet block  Possible bilateral lumbar facet RFA  Diagnostic right L5-S1 LESI  Possible right L5 vs S1 TFESI  Diagnostic bilateral IA knee injections (w/ steroid)  Possible series of 5 IA Hyalgan knee injections  Diagnostic bilateral Genicular NB  Possible bilateral Genicular nerve RFA     Palliative PRN treatment(s):   At this point, we have done no injections for this patient.    Recent Visits No visits were found meeting these conditions. Showing recent visits within past 90 days and meeting all other requirements Today's Visits Date Type Provider Dept  01/15/21 Office Visit Milinda Pointer, MD [facility]  Showing today's visits and meeting all other requirements Future Appointments No visits were found meeting these conditions. Showing future appointments within next 90 days and meeting all other requirements I discussed the assessment and treatment plan with the patient. The patient was provided an opportunity to ask questions and all were answered. The patient agreed with the plan and demonstrated an understanding of the instructions.  Patient advised to call back or seek an in-person evaluation if the symptoms or condition worsens.  Duration of encounter: 61 minutes.  Note by: Gaspar Cola, MD Date: 01/15/2021; Time: 9:55 AM

## 2021-01-15 NOTE — Progress Notes (Signed)
Nursing Pain Medication Assessment:  Safety precautions to be maintained throughout the outpatient stay will include: orient to surroundings, keep bed in low position, maintain call bell within reach at all times, provide assistance with transfer out of bed and ambulation.  Medication Inspection Compliance: Pill count conducted under aseptic conditions, in front of the patient. Neither the pills nor the bottle was removed from the patient's sight at any time. Once count was completed pills were immediately returned to the patient in their original bottle.  Medication: Oxycodone IR Pill/Patch Count:  35 of 120 pills remain Pill/Patch Appearance: Markings consistent with prescribed medication Bottle Appearance: Standard pharmacy container. Clearly labeled. Filled Date: 11/12 / 2022 Last Medication intake:  Today Safety precautions to be maintained throughout the outpatient stay will include: orient to surroundings, keep bed in low position, maintain call bell within reach at all times, provide assistance with transfer out of bed and ambulation.

## 2021-03-26 DIAGNOSIS — I10 Essential (primary) hypertension: Secondary | ICD-10-CM | POA: Insufficient documentation

## 2021-03-26 DIAGNOSIS — R9431 Abnormal electrocardiogram [ECG] [EKG]: Secondary | ICD-10-CM | POA: Insufficient documentation

## 2021-04-05 NOTE — Progress Notes (Signed)
PROVIDER NOTE: Information contained herein reflects review and annotations entered in association with encounter. Interpretation of such information and data should be left to medically-trained personnel. Information provided to patient can be located elsewhere in the medical record under "Patient Instructions". Document created using STT-dictation technology, any transcriptional errors that may result from process are unintentional.    Patient: Holly Spears  Service Category: E/M  Provider: Gaspar Cola, MD  DOB: 25-Feb-1970  DOS: 04/07/2021  Specialty: Interventional Pain Management  MRN: [id number]  Setting: Ambulatory outpatient  PCP: Lonzo Cloud, PA-C  Type: Established Patient    Referring Provider: Lonzo Cloud, PA-C  Location: Office  Delivery: Face-to-face     HPI  [name], a 51 y.o. year old female, is here today because of her Chronic pain syndrome [G89.4]. [name]'s primary complain today is Back Pain (mid) Last encounter: My last encounter with her was on 01/15/2021. Pertinent problems: 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; Lumbar spondylosis (L3-4, L4-5, and L5-S1); Lumbar bulging disc (L3-4, L4-5, and L5-S1); Chronic lumbar radicular pain (Bilateral) (R>L); Chronic knee pain (3ry area of Pain) (Bilateral) (R>L); Chronic upper back pain (Left); Thoracic disc herniation (Large, Left paracentral T10-11 disc herniation);  Thoracic spinal stenosis (Severe, Left, T10-11 Lateral Recess Stenosis); Thoracic foraminal stenosis (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 Pain Assessment: Severity of Chronic pain is reported as a 2 /10. Location: Back Mid/down back of right leg to heel of foot. Onset: More than a month ago. Quality: Aching, Radiating, Discomfort, Constant. Timing: Constant. Modifying factor(s): meds, heat. Vitals:  height is 5' 6"  (1.676 m) and weight is 170 lb (77.1 kg). Her temperature is 97.2 F (36.2 C) (abnormal). Her blood pressure is 125/86 and her pulse is 87. Her respiration is 18 and oxygen saturation is 100%.   Reason for encounter: medication management.   The patient indicates doing well with the current medication regimen. No adverse reactions or side effects reported to the medications.   RTCB: 07/22/2021 Nonopioids transferred 11/22/2019: Flexeril  Pharmacotherapy Assessment  Analgesic: Oxycodone IR 10 mg, 1 tab PO q 6 hrs (40 mg/day of oxycodone) MME/day: 60 mg/day.   Monitoring: [place] PMP: PDMP reviewed during this encounter.       Pharmacotherapy: No side-effects or adverse reactions reported. Compliance: No problems identified. Effectiveness: Clinically acceptable.  Ignatius Specking, RN  04/07/2021 11:41 AM  Sign when Signing Visit Nursing Pain Medication Assessment:  Safety precautions to be maintained throughout the outpatient stay will include: orient to surroundings, keep bed in low position, maintain call bell within reach at all times, provide assistance with transfer out of bed and ambulation.  Medication Inspection Compliance: Pill count conducted under aseptic conditions, in front of the patient. Neither the pills nor the bottle  was removed from the patient's sight at any time. Once count was completed  pills were immediately returned to the patient in their original bottle.  Medication: Oxycodone IR Pill/Patch Count:  62 of 120 pills remain Pill/Patch Appearance: Markings consistent with prescribed medication Bottle Appearance: Standard pharmacy container. Clearly labeled. Filled Date: 2 / 20 / 2023 Last Medication intake:  Today    UDS:  Summary  Date Value Ref Range Status  06/10/2020 Note  Final    Comment:    ==================================================================== [employer] [facility] 13 (MW) ==================================================================== Specimen Alert Note: Urinary creatinine is low; ability to detect some drugs may be compromised. Interpret results with caution. (Creatinine) ==================================================================== Test                             Result       Flag       Units  Drug Present and Declared for Prescription Verification   Oxycodone                      2932         EXPECTED   ng/mg creat   Noroxycodone                   3895         EXPECTED   ng/mg creat    Sources of oxycodone include scheduled prescription medications.    Noroxycodone is an expected metabolite of oxycodone.  ==================================================================== Test                      Result    Flag   Units      Ref Range   Creatinine              19        LL     mg/dL      >=20 ==================================================================== Declared Medications:  The flagging and interpretation on this report are based on the  following declared medications.  Unexpected results may arise from  inaccuracies in the declared medications.   **Note: The testing scope of this panel includes these medications:   Oxycodone   **Note: The testing scope of this panel does not include the  following reported medications:   Biotin   Bupropion (Wellbutrin)  Calcium  Citalopram  Cyclobenzaprine  Epinephrine (EpiPen)  Famotidine  Fluoride  Hydroxychloroquine  Ibuprofen  Levonorgestrel  Levothyroxine  Magnesium  Multivitamin  Naloxone  Potassium Chloride  Vitamin D  Vitamin D3  Zinc ==================================================================== For clinical consultation, please call [phone]. ====================================================================      ROS  Constitutional: Denies any fever or chills Gastrointestinal: No reported hemesis, hematochezia, vomiting, or acute GI distress Musculoskeletal: Denies any acute onset joint swelling, redness, loss of ROM, or weakness Neurological: No reported episodes of acute onset apraxia, aphasia, dysarthria, agnosia, amnesia, paralysis, loss of coordination, or loss of consciousness  Medication Review  Biotin, Calcium Carbonate-Vitamin D, EPINEPHrine, Magnesium, Multi-Vitamins, Oxycodone HCl, Sodium Fluoride, Vitamin D3, Zinc, buPROPion, citalopram, cyclobenzaprine, famotidine, hydroxychloroquine, ibuprofen, levonorgestrel, levothyroxine, naloxone, potassium chloride SA, and propranolol ER  History Review  Allergy: [name] is allergic to bee venom, penicillins, shellfish allergy, sulfa antibiotics, erythromycin, zaleplon, codeine, and trazodone. Drug: [name]  reports no history of drug use. Alcohol:  reports no history of alcohol use. Tobacco:  reports that she has quit smoking. She has never used smokeless tobacco. Social: [name]  reports that she has quit smoking. She has never used smokeless tobacco. She reports that she  does not drink alcohol and does not use drugs. Medical:  has a past medical history of Acid reflux (12/15/2011), Anxiety, generalized (12/15/2011), Arthritis, Cervical pain (12/15/2011), Depression, GERD (gastroesophageal reflux disease), Hiatal hernia, LBP (low back pain) (12/15/2011), Migraine, Muscle ache  (12/15/2011), Osteopenia of left hip, Pain (12/27/2012), Rheumatoid arthritis ([place]) (12/15/2011), and Scratched cornea (10/25/2015). Surgical: [name]  has a past surgical history that includes Cholecystectomy and Abdominal hysterectomy. Family: family history includes Depression in her mother; Diabetes in her father; Hyperlipidemia in her mother; Hypertension in her father and mother.  Laboratory Chemistry Profile   Renal Lab Results  Component Value Date   BUN 6 03/03/2018   CREATININE 0.63 03/03/2018   BCR 10 03/03/2018   GFRAA 123 03/03/2018   GFRNONAA 106 03/03/2018    Hepatic Lab Results  Component Value Date   AST 21 03/03/2018   ALT 26 03/27/2015   ALBUMIN 4.6 03/03/2018   ALKPHOS 71 03/03/2018    Electrolytes Lab Results  Component Value Date   NA 136 03/03/2018   K 3.9 03/03/2018   CL 97 03/03/2018   CALCIUM 8.9 03/03/2018   MG 2.1 03/03/2018    Bone Lab Results  Component Value Date   VD25OH 29.3 (L) 12/31/2014    Inflammation (CRP: Acute Phase) (ESR: Chronic Phase) Lab Results  Component Value Date   CRP <1 03/03/2018   ESRSEDRATE 2 03/03/2018         Note: Above Lab results reviewed.  Recent Imaging Review  DG Lumbar Spine Complete W/Bend CLINICAL DATA:  Chronic pain, no injury Please comment in the report about spinal instability (>4 mm displacement), in addition to any: Spondylolisthesis (specify retro- or antero- and Grade level) (describe displacement in millimeters )  EXAM: LUMBAR SPINE - COMPLETE WITH BENDING VIEWS  COMPARISON:  None.  FINDINGS: There is mild degenerative change in the facet joints of the lower lumbar spine. No acute fracture or subluxation. Surgical clips are noted in the right upper quadrant the abdomen. Intrauterine device overlies the central pelvis.  IMPRESSION: Mild lower lumbar degenerative changes.  No spondylolisthesis.  Electronically Signed   By: Nolon Nations M.D.   On: 02/18/2017 15:47 DG  Thoracic Spine 2 View CLINICAL DATA:  Chronic pain, no injury Please comment in the report about spinal instability (>4 mm displacement), in addition to any: Spondylolisthesis (specify retro- or antero- and Grade level) (describe displacement in millimeters);  EXAM: THORACIC SPINE 2 VIEWS  COMPARISON:  None.  FINDINGS: There is mild S shaped scoliosis of the thoracic spine not associated with vertebral body anomalies. Mild degenerative changes are seen in the thoracic spine. No spondylolisthesis. Mild anterior wedge morphology noted at T11 is of indeterminate age. No acute fracture.  IMPRESSION: Mild midthoracic spondylosis.  No spondylolisthesis.  Mild anterior wedging of T11, age indeterminate.  Electronically Signed   By: Nolon Nations M.D.   On: 02/18/2017 15:44 Note: Reviewed        Physical Exam  General appearance: Well nourished, well developed, and well hydrated. In no apparent acute distress Mental status: Alert, oriented x 3 (person, place, & time)       Respiratory: No evidence of acute respiratory distress Eyes: PERLA Vitals: BP 125/86    Pulse 87    Temp (!) 97.2 F (36.2 C)    Resp 18    Ht 5' 6"  (1.676 m)    Wt 170 lb (77.1 kg)    SpO2 100%    BMI 27.44 kg/m  BMI:  Estimated body mass index is 27.44 kg/m as calculated from the following:   Height as of this encounter: 5' 6"  (1.676 m).   Weight as of this encounter: 170 lb (77.1 kg). Ideal: Ideal body weight: 59.3 kg (130 lb 11.7 oz) Adjusted ideal body weight: 66.4 kg (146 lb 7 oz)  Assessment   Status Diagnosis  Controlled Controlled Controlled 1. Chronic pain syndrome   2. Chronic low back pain (1ry area of Pain) (Bilateral) (R>L) w/o sciatica   3. Chronic lower extremity pain (2ry area of Pain) (Bilateral)   4. Chronic knee pain (3ry area of Pain) (Bilateral) (R>L)   5. Chronic neck pain (4th area of Pain)   6. Chronic shoulder pain (5th area of Pain) (Bilateral) (R>L)   7. Failed back  surgical syndrome   8. Chronic musculoskeletal pain   9. Pharmacologic therapy   10. Chronic use of opiate for therapeutic purpose   11. Encounter for medication management   12. Encounter for chronic pain management      Updated Problems: No problems updated.  Plan of Care  Problem-specific:  No problem-specific Assessment & Plan notes found for this encounter.  [name] has a current medication list which includes the following long-term medication(s): calcium carbonate-vitamin d, citalopram, famotidine, levonorgestrel, levothyroxine, [START ON 04/23/2021] oxycodone hcl, [START ON 05/23/2021] oxycodone hcl, [START ON 06/22/2021] oxycodone hcl, propranolol er, bupropion, cyclobenzaprine, and naloxone.  Pharmacotherapy (Medications Ordered): Meds ordered this encounter  Medications   Oxycodone HCl 10 MG TABS    Sig: Take 1 tablet (10 mg total) by mouth every 6 (six) hours as needed. Must last 30 days    Dispense:  120 tablet    Refill:  0    DO NOT: delete (not duplicate); no partial-fill (will deny script to complete), no refill request (F/U required). DISPENSE: 1 day early if closed on fill date. WARN: No CNS-depressants within 8 hrs of med.   Oxycodone HCl 10 MG TABS    Sig: Take 1 tablet (10 mg total) by mouth every 6 (six) hours as needed. Must last 30 days    Dispense:  120 tablet    Refill:  0    DO NOT: delete (not duplicate); no partial-fill (will deny script to complete), no refill request (F/U required). DISPENSE: 1 day early if closed on fill date. WARN: No CNS-depressants within 8 hrs of med.   Oxycodone HCl 10 MG TABS    Sig: Take 1 tablet (10 mg total) by mouth every 6 (six) hours as needed. Must last 30 days    Dispense:  120 tablet    Refill:  0    DO NOT: delete (not duplicate); no partial-fill (will deny script to complete), no refill request (F/U required). DISPENSE: 1 day early if closed on fill date. WARN: No CNS-depressants within 8 hrs of med.    Orders:  No orders of the defined types were placed in this encounter.  Follow-up plan:   Return in about 3 months (around 07/22/2021) for Eval-day (M,W), (F2F), (MM).     Interventional Therapies  Risk   Complexity Considerations:   Estimated body mass index is 27.44 kg/m as calculated from the following:   Height as of this encounter: 5' 6"  (1.676 m).   Weight as of this encounter: 170 lb (77.1 kg). WNL   Planned   Pending:   The patient had a bad experience with another physician receiving blocks.   Under consideration:   Diagnostic bilateral lumbar  facet block  Possible bilateral lumbar facet RFA  Diagnostic right L5-S1 LESI  Possible right L5 vs S1 TFESI  Diagnostic bilateral IA knee injections (w/ steroid)  Possible series of 5 IA Hyalgan knee injections  Diagnostic bilateral Genicular NB  Possible bilateral Genicular nerve RFA    Completed:   None.   Therapeutic   Palliative (PRN) options:   None.    Recent Visits Date Type Provider Dept  01/15/21 Office Visit Milinda Pointer, MD [facility]  Showing recent visits within past 90 days and meeting all other requirements Today's Visits Date Type Provider Dept  04/07/21 Office Visit Milinda Pointer, MD [facility]  Showing today's visits and meeting all other requirements Future Appointments No visits were found meeting these conditions. Showing future appointments within next 90 days and meeting all other requirements  I discussed the assessment and treatment plan with the patient. The patient was provided an opportunity to ask questions and all were answered. The patient agreed with the plan and demonstrated an understanding of the instructions.  Patient advised to call back or seek an in-person evaluation if the symptoms or condition worsens.  Duration of encounter: 30 minutes.  Note by: Gaspar Cola, MD Date: 04/07/2021; Time: 12:09 PM

## 2021-04-07 ENCOUNTER — Ambulatory Visit: Payer: 59 | Attending: Pain Medicine | Admitting: Pain Medicine

## 2021-04-07 ENCOUNTER — Other Ambulatory Visit: Payer: Self-pay

## 2021-04-07 ENCOUNTER — Encounter: Payer: Self-pay | Admitting: Pain Medicine

## 2021-04-07 VITALS — BP 125/86 | HR 87 | Temp 97.2°F | Resp 18 | Ht 66.0 in | Wt 170.0 lb

## 2021-04-07 DIAGNOSIS — M545 Low back pain, unspecified: Secondary | ICD-10-CM | POA: Insufficient documentation

## 2021-04-07 DIAGNOSIS — M79604 Pain in right leg: Secondary | ICD-10-CM | POA: Diagnosis present

## 2021-04-07 DIAGNOSIS — M542 Cervicalgia: Secondary | ICD-10-CM | POA: Diagnosis present

## 2021-04-07 DIAGNOSIS — Z79891 Long term (current) use of opiate analgesic: Secondary | ICD-10-CM

## 2021-04-07 DIAGNOSIS — M25561 Pain in right knee: Secondary | ICD-10-CM

## 2021-04-07 DIAGNOSIS — M79605 Pain in left leg: Secondary | ICD-10-CM

## 2021-04-07 DIAGNOSIS — M25511 Pain in right shoulder: Secondary | ICD-10-CM | POA: Diagnosis present

## 2021-04-07 DIAGNOSIS — M25512 Pain in left shoulder: Secondary | ICD-10-CM | POA: Diagnosis present

## 2021-04-07 DIAGNOSIS — Z79899 Other long term (current) drug therapy: Secondary | ICD-10-CM | POA: Diagnosis present

## 2021-04-07 DIAGNOSIS — G8929 Other chronic pain: Secondary | ICD-10-CM | POA: Diagnosis present

## 2021-04-07 DIAGNOSIS — G894 Chronic pain syndrome: Secondary | ICD-10-CM

## 2021-04-07 DIAGNOSIS — M7918 Myalgia, other site: Secondary | ICD-10-CM | POA: Diagnosis present

## 2021-04-07 DIAGNOSIS — M961 Postlaminectomy syndrome, not elsewhere classified: Secondary | ICD-10-CM

## 2021-04-07 DIAGNOSIS — M25562 Pain in left knee: Secondary | ICD-10-CM | POA: Insufficient documentation

## 2021-04-07 MED ORDER — OXYCODONE HCL 10 MG PO TABS: 10.0000 mg | ORAL_TABLET | Freq: Four times a day (QID) | ORAL | 0 refills | Status: DC | PRN

## 2021-04-07 NOTE — Patient Instructions (Signed)
____________________________________________________________________________________________ ° °Medication Rules ° °Purpose: To inform patients, and their family members, of our rules and regulations. ° °Applies to: All patients receiving prescriptions (written or electronic). ° °Pharmacy of record: Pharmacy where electronic prescriptions will be sent. If written prescriptions are taken to a different pharmacy, please inform the nursing staff. The pharmacy listed in the electronic medical record should be the one where you would like electronic prescriptions to be sent. ° °Electronic prescriptions: In compliance with the [place] Strengthen Opioid Misuse Prevention (STOP) Act of 2017 (Session Law 2017-74/H243), effective February 02, 2018, all controlled substances must be electronically prescribed. Calling prescriptions to the pharmacy will cease to exist. ° °Prescription refills: Only during scheduled appointments. Applies to all prescriptions. ° °NOTE: The following applies primarily to controlled substances (Opioid* Pain Medications).  ° °Type of encounter (visit): For patients receiving controlled substances, face-to-face visits are required. (Not an option or up to the patient.) ° °Patient's responsibilities: °Pain Pills: Bring all pain pills to every appointment (except for procedure appointments). °Pill Bottles: Bring pills in original pharmacy bottle. Always bring the newest bottle. Bring bottle, even if empty. °Medication refills: You are responsible for knowing and keeping track of what medications you take and those you need refilled. °The day before your appointment: write a list of all prescriptions that need to be refilled. °The day of the appointment: give the list to the admitting nurse. Prescriptions will be written only during appointments. No prescriptions will be written on procedure days. °If you forget a medication: it will not be "Called in", "Faxed", or "electronically sent". You will  need to get another appointment to get these prescribed. °No early refills. Do not call asking to have your prescription filled early. °Prescription Accuracy: You are responsible for carefully inspecting your prescriptions before leaving our office. Have the discharge nurse carefully go over each prescription with you, before taking them home. Make sure that your name is accurately spelled, that your address is correct. Check the name and dose of your medication to make sure it is accurate. Check the number of pills, and the written instructions to make sure they are clear and accurate. Make sure that you are given enough medication to last until your next medication refill appointment. °Taking Medication: Take medication as prescribed. When it comes to controlled substances, taking less pills or less frequently than prescribed is permitted and encouraged. °Never take more pills than instructed. °Never take medication more frequently than prescribed.  °Inform other Doctors: Always inform, all of your healthcare providers, of all the medications you take. °Pain Medication from other Providers: You are not allowed to accept any additional pain medication from any other Doctor or Healthcare provider. There are two exceptions to this rule. (see below) In the event that you require additional pain medication, you are responsible for notifying us, as stated below. °Cough Medicine: Often these contain an opioid, such as codeine or hydrocodone. Never accept or take cough medicine containing these opioids if you are already taking an opioid* medication. The combination may cause respiratory failure and death. °Medication Agreement: You are responsible for carefully reading and following our Medication Agreement. This must be signed before receiving any prescriptions from our practice. Safely store a copy of your signed Agreement. Violations to the Agreement will result in no further prescriptions. (Additional copies of our  Medication Agreement are available upon request.) °Laws, Rules, & Regulations: All patients are expected to follow all Federal and State Laws, Statutes, Rules, & Regulations. Ignorance of   the Laws does not constitute a valid excuse.  °Illegal drugs and Controlled Substances: The use of illegal substances (including, but not limited to marijuana and its derivatives) and/or the illegal use of any controlled substances is strictly prohibited. Violation of this rule may result in the immediate and permanent discontinuation of any and all prescriptions being written by our practice. The use of any illegal substances is prohibited. °Adopted CDC guidelines & recommendations: Target dosing levels will be at or below 60 MME/day. Use of benzodiazepines** is not recommended. ° °Exceptions: There are only two exceptions to the rule of not receiving pain medications from other Healthcare Providers. °Exception #1 (Emergencies): In the event of an emergency (i.e.: accident requiring emergency care), you are allowed to receive additional pain medication. However, you are responsible for: As soon as you are able, call our office [phone], at any time of the day or night, and leave a message stating your name, the date and nature of the emergency, and the name and dose of the medication prescribed. In the event that your call is answered by a member of our staff, make sure to document and save the date, time, and the name of the person that took your information.  °Exception #2 (Planned Surgery): In the event that you are scheduled by another doctor or dentist to have any type of surgery or procedure, you are allowed (for a period no longer than 30 days), to receive additional pain medication, for the acute post-op pain. However, in this case, you are responsible for picking up a copy of our "Post-op Pain Management for Surgeons" handout, and giving it to your surgeon or dentist. This document is available at our office, and  does not require an appointment to obtain it. Simply go to our office during business hours (Monday-Thursday from 8:00 AM to 4:00 PM) (Friday 8:00 AM to 12:00 Noon) or if you have a scheduled appointment with us, prior to your surgery, and ask for it by name. In addition, you are responsible for: calling our office [phone], at any time of the day or night, and leaving a message stating your name, name of your surgeon, type of surgery, and date of procedure or surgery. Failure to comply with your responsibilities may result in termination of therapy involving the controlled substances. °Medication Agreement Violation. Following the above rules, including your responsibilities will help you in avoiding a Medication Agreement Violation (“Breaking your Pain Medication Contract”). ° °*Opioid medications include: morphine, codeine, oxycodone, oxymorphone, hydrocodone, hydromorphone, meperidine, tramadol, tapentadol, buprenorphine, fentanyl, methadone. °**Benzodiazepine medications include: diazepam (Valium), alprazolam (Xanax), clonazepam (Klonopine), lorazepam (Ativan), clorazepate (Tranxene), chlordiazepoxide (Librium), estazolam (Prosom), oxazepam (Serax), temazepam (Restoril), triazolam (Halcion) °(Last updated: 10/30/2020) °____________________________________________________________________________________________ ° ____________________________________________________________________________________________ ° °Medication Recommendations and Reminders ° °Applies to: All patients receiving prescriptions (written and/or electronic). ° °Medication Rules & Regulations: These rules and regulations exist for your safety and that of others. They are not flexible and neither are we. Dismissing or ignoring them will be considered "non-compliance" with medication therapy, resulting in complete and irreversible termination of such therapy. (See document titled "Medication Rules" for more details.) In all conscience,  because of safety reasons, we cannot continue providing a therapy where the patient does not follow instructions. ° °Pharmacy of record:  °Definition: This is the pharmacy where your electronic prescriptions will be sent.  °We do not endorse any particular pharmacy, however, we have experienced problems with [facility] not securing enough medication supply for the community. °We do not restrict you   in your choice of pharmacy. However, once we write for your prescriptions, we will NOT be re-sending more prescriptions to fix restricted supply problems created by your pharmacy, or your insurance.  °The pharmacy listed in the electronic medical record should be the one where you want electronic prescriptions to be sent. °If you choose to change pharmacy, simply notify our nursing staff. ° °Recommendations: °Keep all of your pain medications in a safe place, under lock and key, even if you live alone. We will NOT replace lost, stolen, or damaged medication. °After you fill your prescription, take 1 week's worth of pills and put them away in a safe place. You should keep a separate, properly labeled bottle for this purpose. The remainder should be kept in the original bottle. Use this as your primary supply, until it runs out. Once it's gone, then you know that you have 1 week's worth of medicine, and it is time to come in for a prescription refill. If you do this correctly, it is unlikely that you will ever run out of medicine. °To make sure that the above recommendation works, it is very important that you make sure your medication refill appointments are scheduled at least 1 week before you run out of medicine. To do this in an effective manner, make sure that you do not leave the office without scheduling your next medication management appointment. Always ask the nursing staff to show you in your prescription , when your medication will be running out. Then arrange for the receptionist to get you a return appointment,  at least 7 days before you run out of medicine. Do not wait until you have 1 or 2 pills left, to come in. This is very poor planning and does not take into consideration that we may need to cancel appointments due to bad weather, sickness, or emergencies affecting our staff. °DO NOT ACCEPT A "Partial Fill": If for any reason your pharmacy does not have enough pills/tablets to completely fill or refill your prescription, do not allow for a "partial fill". The law allows the pharmacy to complete that prescription within 72 hours, without requiring a new prescription. If they do not fill the rest of your prescription within those 72 hours, you will need a separate prescription to fill the remaining amount, which we will NOT provide. If the reason for the partial fill is your insurance, you will need to talk to the pharmacist about payment alternatives for the remaining tablets, but again, DO NOT ACCEPT A PARTIAL FILL, unless you can trust your pharmacist to obtain the remainder of the pills within 72 hours. ° °Prescription refills and/or changes in medication(s):  °Prescription refills, and/or changes in dose or medication, will be conducted only during scheduled medication management appointments. (Applies to both, written and electronic prescriptions.) °No refills on procedure days. No medication will be changed or started on procedure days. No changes, adjustments, and/or refills will be conducted on a procedure day. Doing so will interfere with the diagnostic portion of the procedure. °No phone refills. No medications will be "called into the pharmacy". °No Fax refills. °No weekend refills. °No Holliday refills. °No after hours refills. ° °Remember:  °Business hours are:  °Monday to Thursday 8:00 AM to 4:00 PM °Provider's Schedule: °Constance Hackenberg, MD - Appointments are:  °Medication management: Monday and Wednesday 8:00 AM to 4:00 PM °Procedure day: Tuesday and Thursday 7:30 AM to 4:00 PM °Bilal Lateef, MD -  Appointments are:  °Medication management: Tuesday and Thursday 8:00   AM to 4:00 PM °Procedure day: Monday and Wednesday 7:30 AM to 4:00 PM °(Last update: 08/23/2019) °____________________________________________________________________________________________ ° ____________________________________________________________________________________________ ° °CBD (cannabidiol) & Delta-8 (Delta-8 tetrahydrocannabinol) WARNING ° °Intro: Cannabidiol (CBD) and tetrahydrocannabinol (THC), are two natural compounds found in plants of the Cannabis genus. They can both be extracted from hemp or cannabis. Hemp and cannabis come from the Cannabis sativa plant. Both compounds interact with your body’s endocannabinoid system, but they have very different effects. CBD does not produce the high sensation associated with cannabis. Delta-8 tetrahydrocannabinol, also known as delta-8 THC, is a psychoactive substance found in the Cannabis sativa plant, of which marijuana and hemp are two varieties. THC is responsible for the high associated with the illicit use of marijuana. ° °Applicable to: All individuals currently taking or considering taking CBD (cannabidiol) and, more important, all patients taking opioid analgesic controlled substances (pain medication). (Example: oxycodone; oxymorphone; hydrocodone; hydromorphone; morphine; methadone; tramadol; tapentadol; fentanyl; buprenorphine; butorphanol; dextromethorphan; meperidine; codeine; etc.) ° °Legal status: CBD remains a Schedule I drug prohibited for any use. CBD is illegal with one exception. In the [place], CBD has a limited Food and Drug Administration (FDA) approval for the treatment of two specific types of epilepsy disorders. Only one CBD product has been approved by the FDA for this purpose: "Epidiolex". FDA is aware that some companies are marketing products containing cannabis and cannabis-derived compounds in ways that violate the Federal Food, Drug and Cosmetic Act  (FD&C Act) and that may put the health and safety of consumers at risk. The FDA, a Federal agency, has not enforced the CBD status since 2018. UPDATE: (03/21/2021) The Drug Enforcement Agency (DEA) issued a letter stating that "delta" cannabinoids, including Delta-8-THCO and Delta-9-THCO, synthetically derived from hemp do not qualify as hemp and will be viewed as Schedule I drugs. (Schedule I drugs, substances, or chemicals are defined as drugs with no currently accepted medical use and a high potential for abuse. Some examples of Schedule I drugs are: heroin, lysergic acid diethylamide (LSD), marijuana (cannabis), 3,4-methylenedioxymethamphetamine (ecstasy), methaqualone, and peyote.) (https://www.dea.gov) ° °Legality: Some manufacturers ship CBD products nationally, which is illegal. Often such products are sold online and are therefore available throughout the country. CBD is openly sold in head shops and health food stores in some states where such sales have not been explicitly legalized. Selling unapproved products with unsubstantiated therapeutic claims is not only a violation of the law, but also can put patients at risk, as these products have not been proven to be safe or effective. Federal illegality makes it difficult to conduct research on CBD. ° °Reference: "FDA Regulation of Cannabis and Cannabis-Derived Products, Including Cannabidiol (CBD)" - https://www.fda.gov/news-events/public-health-focus/fda-regulation-cannabis-and-cannabis-derived-products-including-cannabidiol-cbd ° °Warning: CBD is not FDA approved and has not undergo the same manufacturing controls as prescription drugs.  This means that the purity and safety of available CBD may be questionable. Most of the time, despite manufacturer's claims, it is contaminated with THC (delta-9-tetrahydrocannabinol - the chemical in marijuana responsible for the "HIGH").  When this is the case, the THC contaminant will trigger a positive urine drug  screen (UDS) test for Marijuana (carboxy-THC). Because a positive UDS for any illicit substance is a violation of our medication agreement, your opioid analgesics (pain medicine) may be permanently discontinued. °The FDA recently put out a warning about 5 things that everyone should be aware of regarding Delta-8 THC: °Delta-8 THC products have not been evaluated or approved by the FDA for safe use and may be marketed in ways that put the   public health at risk. °The FDA has received adverse event reports involving delta-8 THC-containing products. °Delta-8 THC has psychoactive and intoxicating effects. °Delta-8 THC manufacturing often involve use of potentially harmful chemicals to create the concentrations of delta-8 THC claimed in the marketplace. The final delta-8 THC product may have potentially harmful by-products (contaminants) due to the chemicals used in the process. Manufacturing of delta-8 THC products may occur in uncontrolled or unsanitary settings, which may lead to the presence of unsafe contaminants or other potentially harmful substances. °Delta-8 THC products should be kept out of the reach of children and pets. ° °MORE ABOUT CBD ° °General Information: CBD was discovered in 1940 and it is a derivative of the cannabis sativa genus plants (Marijuana and Hemp). It is one of the 113 identified substances found in Marijuana. It accounts for up to 40% of the plant's extract. As of 2018, preliminary clinical studies on CBD included research for the treatment of anxiety, movement disorders, and pain. CBD is available and consumed in multiple forms, including inhalation of smoke or vapor, as an aerosol spray, and by mouth. It may be supplied as an oil containing CBD, capsules, dried cannabis, or as a liquid solution. CBD is thought not to be as psychoactive as THC (delta-9-tetrahydrocannabinol - the chemical in marijuana responsible for the "HIGH"). Studies suggest that CBD may interact with different  biological target receptors in the body, including cannabinoid and other neurotransmitter receptors. As of 2018 the mechanism of action for its biological effects has not been determined. ° °Side-effects   Adverse reactions: Dry mouth, diarrhea, decreased appetite, fatigue, drowsiness, malaise, weakness, sleep disturbances, and others. ° °Drug interactions: CBC may interact with other medications such as blood-thinners. Because CBD causes drowsiness on its own, it also increases the drowsiness caused by other medications, including antihistamines (such as Benadryl), benzodiazepines (Xanax, Ativan, Valium), antipsychotics, antidepressants and opioids, as well as alcohol and supplements such as kava, melatonin and St. John's Wort. Be cautious with the following combinations:  ° °Brivaracetam (Briviact) °Brivaracetam is changed and broken down by the body. CBD might decrease how quickly the body breaks down brivaracetam. This might increase levels of brivaracetam in the body. ° °Caffeine °Caffeine is changed and broken down by the body. CBD might decrease how quickly the body breaks down caffeine. This might increase levels of caffeine in the body. ° °Carbamazepine (Tegretol) °Carbamazepine is changed and broken down by the body. CBD might decrease how quickly the body breaks down carbamazepine. This might increase levels of carbamazepine in the body and increase its side effects. ° °Citalopram (Celexa) °Citalopram is changed and broken down by the body. CBD might decrease how quickly the body breaks down citalopram. This might increase levels of citalopram in the body and increase its side effects. ° °Clobazam (Onfi) °Clobazam is changed and broken down by the liver. CBD might decrease how quickly the liver breaks down clobazam. This might increase the effects and side effects of clobazam. ° °Eslicarbazepine (Aptiom) °Eslicarbazepine is changed and broken down by the body. CBD might decrease how quickly the body  breaks down eslicarbazepine. This might increase levels of eslicarbazepine in the body by a small amount. ° °Everolimus (Zostress) °Everolimus is changed and broken down by the body. CBD might decrease how quickly the body breaks down everolimus. This might increase levels of everolimus in the body. ° °Lithium °Taking higher doses of CBD might increase levels of lithium. This can increase the risk of lithium toxicity. ° °Medications changed by the   liver (Cytochrome P450 1A1 (CYP1A1) substrates) °Some medications are changed and broken down by the liver. CBD might change how quickly the liver breaks down these medications. This could change the effects and side effects of these medications. ° °Medications changed by the liver (Cytochrome P450 1A2 (CYP1A2) substrates) °Some medications are changed and broken down by the liver. CBD might change how quickly the liver breaks down these medications. This could change the effects and side effects of these medications. ° °Medications changed by the liver (Cytochrome P450 1B1 (CYP1B1) substrates) °Some medications are changed and broken down by the liver. CBD might change how quickly the liver breaks down these medications. This could change the effects and side effects of these medications. ° °Medications changed by the liver (Cytochrome P450 2A6 (CYP2A6) substrates) °Some medications are changed and broken down by the liver. CBD might change how quickly the liver breaks down these medications. This could change the effects and side effects of these medications. ° °Medications changed by the liver (Cytochrome P450 2B6 (CYP2B6) substrates) °Some medications are changed and broken down by the liver. CBD might change how quickly the liver breaks down these medications. This could change the effects and side effects of these medications. ° °Medications changed by the liver (Cytochrome P450 2C19 (CYP2C19) substrates) °Some medications are changed and broken down by the liver.  CBD might change how quickly the liver breaks down these medications. This could change the effects and side effects of these medications. ° °Medications changed by the liver (Cytochrome P450 2C8 (CYP2C8) substrates) °Some medications are changed and broken down by the liver. CBD might change how quickly the liver breaks down these medications. This could change the effects and side effects of these medications. ° °Medications changed by the liver (Cytochrome P450 2C9 (CYP2C9) substrates) °Some medications are changed and broken down by the liver. CBD might change how quickly the liver breaks down these medications. This could change the effects and side effects of these medications. ° °Medications changed by the liver (Cytochrome P450 2D6 (CYP2D6) substrates) °Some medications are changed and broken down by the liver. CBD might change how quickly the liver breaks down these medications. This could change the effects and side effects of these medications. ° °Medications changed by the liver (Cytochrome P450 2E1 (CYP2E1) substrates) °Some medications are changed and broken down by the liver. CBD might change how quickly the liver breaks down these medications. This could change the effects and side effects of these medications. ° °Medications changed by the liver (Cytochrome P450 3A4 (CYP3A4) substrates) °Some medications are changed and broken down by the liver. CBD might change how quickly the liver breaks down these medications. This could change the effects and side effects of these medications. ° °Medications changed by the liver (Glucuronidated drugs) °Some medications are changed and broken down by the liver. CBD might change how quickly the liver breaks down these medications. This could change the effects and side effects of these medications. ° °Medications that decrease the breakdown of other medications by the liver (Cytochrome P450 2C19 (CYP2C19) inhibitors) °CBD is changed and broken down by the liver.  Some drugs decrease how quickly the liver changes and breaks down CBD. This could change the effects and side effects of CBD. ° °Medications that decrease the breakdown of other medications in the liver (Cytochrome P450 3A4 (CYP3A4) inhibitors) °CBD is changed and broken down by the liver. Some drugs decrease how quickly the liver changes and breaks down CBD. This could change the   effects and side effects of CBD. ° °Medications that increase breakdown of other medications by the liver (Cytochrome P450 3A4 (CYP3A4) inducers) °CBD is changed and broken down by the liver. Some drugs increase how quickly the liver changes and breaks down CBD. This could change the effects and side effects of CBD. ° °Medications that increase the breakdown of other medications by the liver (Cytochrome P450 2C19 (CYP2C19) inducers) °CBD is changed and broken down by the liver. Some drugs increase how quickly the liver changes and breaks down CBD. This could change the effects and side effects of CBD. ° °Methadone (Dolophine) °Methadone is broken down by the liver. CBD might decrease how quickly the liver breaks down methadone. Taking cannabidiol along with methadone might increase the effects and side effects of methadone. ° °Rufinamide (Banzel) °Rufinamide is changed and broken down by the body. CBD might decrease how quickly the body breaks down rufinamide. This might increase levels of rufinamide in the body by a small amount. ° °Sedative medications (CNS depressants) °CBD might cause sleepiness and slowed breathing. Some medications, called sedatives, can also cause sleepiness and slowed breathing. Taking CBD with sedative medications might cause breathing problems and/or too much sleepiness. ° °Sirolimus (Rapamune) °Sirolimus is changed and broken down by the body. CBD might decrease how quickly the body breaks down sirolimus. This might increase levels of sirolimus in the body. ° °Stiripentol (Diacomit) °Stiripentol is changed and  broken down by the body. CBD might decrease how quickly the body breaks down stiripentol. This might increase levels of stiripentol in the body and increase its side effects. ° °Tacrolimus (Prograf) °Tacrolimus is changed and broken down by the body. CBD might decrease how quickly the body breaks down tacrolimus. This might increase levels of tacrolimus in the body. ° °Tamoxifen (Soltamox) °Tamoxifen is changed and broken down by the body. CBD might affect how quickly the body breaks down tamoxifen. This might affect levels of tamoxifen in the body. ° °Topiramate (Topamax) °Topiramate is changed and broken down by the body. CBD might decrease how quickly the body breaks down topiramate. This might increase levels of topiramate in the body by a small amount. ° °Valproate °Valproic acid can cause liver injury. Taking cannabidiol with valproic acid might increase the chance of liver injury. CBD and/or valproic acid might need to be stopped, or the dose might need to be reduced. ° °Warfarin (Coumadin) °CBD might increase levels of warfarin, which can increase the risk for bleeding. CBD and/or warfarin might need to be stopped, or the dose might need to be reduced. ° °Zonisamide °Zonisamide is changed and broken down by the body. CBD might decrease how quickly the body breaks down zonisamide. This might increase levels of zonisamide in the body by a small amount. °(Last update: 04/02/2021) °____________________________________________________________________________________________ ° ____________________________________________________________________________________________ ° °Drug Holidays (Slow) ° °What is a "Drug Holiday"? °Drug Holiday: is the name given to the period of time during which a patient stops taking a medication(s) for the purpose of eliminating tolerance to the drug. ° °Benefits °Improved effectiveness of opioids. °Decreased opioid dose needed to achieve benefits. °Improved pain with lesser  dose. ° °What is tolerance? °Tolerance: is the progressive decreased in effectiveness of a drug due to its repetitive use. With repetitive use, the body gets use to the medication and as a consequence, it loses its effectiveness. This is a common problem seen with opioid pain medications. As a result, a larger dose of the drug is needed to achieve the same effect   that used to be obtained with a smaller dose. ° °How long should a "Drug Holiday" last? °You should stay off of the pain medicine for at least 14 consecutive days. (2 weeks) ° °Should I stop the medicine "cold turkey"? °No. You should always coordinate with your Pain Specialist so that he/she can provide you with the correct medication dose to make the transition as smoothly as possible. ° °How do I stop the medicine? °Slowly. You will be instructed to decrease the daily amount of pills that you take by one (1) pill every seven (7) days. This is called a "slow downward taper" of your dose. For example: if you normally take four (4) pills per day, you will be asked to drop this dose to three (3) pills per day for seven (7) days, then to two (2) pills per day for seven (7) days, then to one (1) per day for seven (7) days, and at the end of those last seven (7) days, this is when the "Drug Holiday" would start.  ° °Will I have withdrawals? °By doing a "slow downward taper" like this one, it is unlikely that you will experience any significant withdrawal symptoms. Typically, what triggers withdrawals is the sudden stop of a high dose opioid therapy. Withdrawals can usually be avoided by slowly decreasing the dose over a prolonged period of time. If you do not follow these instructions and decide to stop your medication abruptly, withdrawals may be possible. ° °What are withdrawals? °Withdrawals: refers to the wide range of symptoms that occur after stopping or dramatically reducing opiate drugs after heavy and prolonged use. Withdrawal symptoms do not occur to  patients that use low dose opioids, or those who take the medication sporadically. Contrary to benzodiazepine (example: Valium, Xanax, etc.) or alcohol withdrawals (“Delirium Tremens”), opioid withdrawals are not lethal. Withdrawals are the physical manifestation of the body getting rid of the excess receptors. ° °Expected Symptoms °Early symptoms of withdrawal may include: °Agitation °Anxiety °Muscle aches °Increased tearing °Insomnia °Runny nose °Sweating °Yawning ° °Late symptoms of withdrawal may include: °Abdominal cramping °Diarrhea °Dilated pupils °Goose bumps °Nausea °Vomiting ° °Will I experience withdrawals? °Due to the slow nature of the taper, it is very unlikely that you will experience any. ° °What is a slow taper? °Taper: refers to the gradual decrease in dose.  °(Last update: 08/23/2019) °____________________________________________________________________________________________ ° °  °

## 2021-04-07 NOTE — Progress Notes (Signed)
Nursing Pain Medication Assessment:  ?Safety precautions to be maintained throughout the outpatient stay will include: orient to surroundings, keep bed in low position, maintain call bell within reach at all times, provide assistance with transfer out of bed and ambulation.  ?Medication Inspection Compliance: Pill count conducted under aseptic conditions, in front of the patient. Neither the pills nor the bottle was removed from the patient's sight at any time. Once count was completed pills were immediately returned to the patient in their original bottle. ? ?Medication: Oxycodone IR ?Pill/Patch Count:  62 of 120 pills remain ?Pill/Patch Appearance: Markings consistent with prescribed medication ?Bottle Appearance: Standard pharmacy container. Clearly labeled. ?Filled Date: 2 / 20 / 2023 ?Last Medication intake:  Today ?

## 2021-04-16 ENCOUNTER — Encounter: Payer: Medicare Other | Admitting: Pain Medicine

## 2021-07-13 NOTE — Progress Notes (Unsigned)
PROVIDER NOTE: Information contained herein reflects review and annotations entered in association with encounter. Interpretation of such information and data should be left to medically-trained personnel. Information provided to patient can be located elsewhere in the medical record under "Patient Instructions". Document created using STT-dictation technology, any transcriptional errors that may result from process are unintentional.    Patient: Holly Spears  Service Category: E/M  Provider: Gaspar Cola, MD  DOB: September 21, 1970  DOS: 07/14/2021  Specialty: Interventional Pain Management  MRN: [id number]  Setting: Ambulatory outpatient  PCP: Lonzo Cloud, PA-C  Type: Established Patient    Referring Provider: Lonzo Cloud, PA-C  Location: Office  Delivery: Face-to-face     HPI  [name], a 51 y.o. year old female, is here today because of her No primary diagnosis found.. [name]'s primary complain today is No chief complaint on file. Last encounter: My last encounter with her was on 04/07/2021. Pertinent problems: 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; Lumbar spondylosis (L3-4, L4-5, and L5-S1); Lumbar bulging disc (L3-4, L4-5, and L5-S1); Chronic lumbar radicular pain (Bilateral) (R>L); Chronic knee pain (3ry area of Pain) (Bilateral) (R>L); Chronic upper back pain (Left); Thoracic disc herniation (Large, Left paracentral T10-11 disc  herniation); Thoracic spinal stenosis (Severe, Left, T10-11 Lateral Recess Stenosis); Thoracic foraminal stenosis (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 Pain Assessment: Severity of   is reported as a  /10. Location:    / . Onset:  . Quality:  . Timing:  . Modifying factor(s):  Marland Kitchen Vitals:  vitals were not taken for this visit.   Reason for encounter:  *** . ***  Pharmacotherapy Assessment  Analgesic: Oxycodone IR 10 mg, 1 tab PO q 6 hrs (40 mg/day of oxycodone) MME/day: 60 mg/day.   Monitoring: [place] PMP: PDMP reviewed during this encounter.       Pharmacotherapy: No side-effects or adverse reactions reported. Compliance: No problems identified. Effectiveness: Clinically acceptable.  No notes on file  UDS:  Summary  Date Value Ref Range Status  06/10/2020 Note  Final    Comment:    ==================================================================== [employer] Select 13 (MW) ==================================================================== Specimen Alert Note: Urinary creatinine is low; ability to detect some drugs may be compromised. Interpret results with caution. (Creatinine) ==================================================================== Test                             Result       Flag       Units  Drug Present and Declared for Prescription Verification   Oxycodone                      2932         EXPECTED   ng/mg creat   Noroxycodone  3895         EXPECTED   ng/mg creat    Sources of oxycodone include scheduled prescription medications.    Noroxycodone is an expected metabolite of oxycodone.  ==================================================================== Test                      Result     Flag   Units      Ref Range   Creatinine              19        LL     mg/dL      >=20 ==================================================================== Declared Medications:  The flagging and interpretation on this report are based on the  following declared medications.  Unexpected results may arise from  inaccuracies in the declared medications.   **Note: The testing scope of this panel includes these medications:   Oxycodone   **Note: The testing scope of this panel does not include the  following reported medications:   Biotin  Bupropion (Wellbutrin)  Calcium  Citalopram  Cyclobenzaprine  Epinephrine (EpiPen)  Famotidine  Fluoride  Hydroxychloroquine  Ibuprofen  Levonorgestrel  Levothyroxine  Magnesium  Multivitamin  Naloxone  Potassium Chloride  Vitamin D  Vitamin D3  Zinc ==================================================================== For clinical consultation, please call [phone]. ====================================================================      ROS  Constitutional: Denies any fever or chills Gastrointestinal: No reported hemesis, hematochezia, vomiting, or acute GI distress Musculoskeletal: Denies any acute onset joint swelling, redness, loss of ROM, or weakness Neurological: No reported episodes of acute onset apraxia, aphasia, dysarthria, agnosia, amnesia, paralysis, loss of coordination, or loss of consciousness  Medication Review  Biotin, Calcium Carbonate-Vitamin D, EPINEPHrine, Magnesium, Multi-Vitamins, Oxycodone HCl, Sodium Fluoride, Vitamin D3, Zinc, buPROPion, citalopram, cyclobenzaprine, famotidine, hydroxychloroquine, ibuprofen, levonorgestrel, levothyroxine, naloxone, potassium chloride SA, and propranolol ER  History Review  Allergy: [name] is allergic to bee venom, penicillins, shellfish allergy, sulfa antibiotics, erythromycin, zaleplon, codeine, and trazodone. Drug: [name]  reports no history of drug  use. Alcohol:  reports no history of alcohol use. Tobacco:  reports that she has quit smoking. She has never used smokeless tobacco. Social: [name]  reports that she has quit smoking. She has never used smokeless tobacco. She reports that she does not drink alcohol and does not use drugs. Medical:  has a past medical history of Acid reflux (12/15/2011), Anxiety, generalized (12/15/2011), Arthritis, Cervical pain (12/15/2011), Depression, GERD (gastroesophageal reflux disease), Hiatal hernia, LBP (low back pain) (12/15/2011), Migraine, Muscle ache (12/15/2011), Osteopenia of left hip, Pain (12/27/2012), Rheumatoid arthritis ([place]) (12/15/2011), and Scratched cornea (10/25/2015). Surgical: [name]  has a past surgical history that includes Cholecystectomy and Abdominal hysterectomy. Family: family history includes Depression in her mother; Diabetes in her father; Hyperlipidemia in her mother; Hypertension in her father and mother.  Laboratory Chemistry Profile   Renal Lab Results  Component Value Date   BUN 6 03/03/2018   CREATININE 0.63 03/03/2018   BCR 10 03/03/2018   GFRAA 123 03/03/2018   GFRNONAA 106 03/03/2018    Hepatic Lab Results  Component Value Date   AST 21 03/03/2018   ALT 26 03/27/2015   ALBUMIN 4.6 03/03/2018   ALKPHOS 71 03/03/2018    Electrolytes Lab Results  Component Value Date   NA 136 03/03/2018   K 3.9 03/03/2018   CL 97 03/03/2018   CALCIUM 8.9 03/03/2018   MG 2.1 03/03/2018    Bone Lab Results  Component Value Date   VD25OH  29.3 (L) 12/31/2014    Inflammation (CRP: Acute Phase) (ESR: Chronic Phase) Lab Results  Component Value Date   CRP <1 03/03/2018   ESRSEDRATE 2 03/03/2018         Note: Above Lab results reviewed.  Recent Imaging Review  DG Lumbar Spine Complete W/Bend CLINICAL DATA:  Chronic pain, no injury Please comment in the report about spinal instability (>4 mm displacement), in addition to any: Spondylolisthesis (specify  retro- or antero- and Grade level) (describe displacement in millimeters )  EXAM: LUMBAR SPINE - COMPLETE WITH BENDING VIEWS  COMPARISON:  None.  FINDINGS: There is mild degenerative change in the facet joints of the lower lumbar spine. No acute fracture or subluxation. Surgical clips are noted in the right upper quadrant the abdomen. Intrauterine device overlies the central pelvis.  IMPRESSION: Mild lower lumbar degenerative changes.  No spondylolisthesis.  Electronically Signed   By: Nolon Nations M.D.   On: 02/18/2017 15:47 DG Thoracic Spine 2 View CLINICAL DATA:  Chronic pain, no injury Please comment in the report about spinal instability (>4 mm displacement), in addition to any: Spondylolisthesis (specify retro- or antero- and Grade level) (describe displacement in millimeters);  EXAM: THORACIC SPINE 2 VIEWS  COMPARISON:  None.  FINDINGS: There is mild S shaped scoliosis of the thoracic spine not associated with vertebral body anomalies. Mild degenerative changes are seen in the thoracic spine. No spondylolisthesis. Mild anterior wedge morphology noted at T11 is of indeterminate age. No acute fracture.  IMPRESSION: Mild midthoracic spondylosis.  No spondylolisthesis.  Mild anterior wedging of T11, age indeterminate.  Electronically Signed   By: Nolon Nations M.D.   On: 02/18/2017 15:44 Note: Reviewed        Physical Exam  General appearance: Well nourished, well developed, and well hydrated. In no apparent acute distress Mental status: Alert, oriented x 3 (person, place, & time)       Respiratory: No evidence of acute respiratory distress Eyes: PERLA Vitals: There were no vitals taken for this visit. BMI: Estimated body mass index is 27.44 kg/m as calculated from the following:   Height as of 04/07/21: $RemoveBe'5\' 6"'lHKnzXteu$  (1.676 m).   Weight as of 04/07/21: 170 lb (77.1 kg). Ideal: Patient weight not recorded  Assessment   Diagnosis Status  No diagnosis  found. Controlled Controlled Controlled   Updated Problems: No problems updated.  Plan of Care  Problem-specific:  No problem-specific Assessment & Plan notes found for this encounter.  [name] has a current medication list which includes the following long-term medication(s): bupropion, calcium carbonate-vitamin d, citalopram, cyclobenzaprine, famotidine, levonorgestrel, levothyroxine, naloxone, oxycodone hcl, oxycodone hcl, oxycodone hcl, and propranolol er.  Pharmacotherapy (Medications Ordered): No orders of the defined types were placed in this encounter.  Orders:  No orders of the defined types were placed in this encounter.  Follow-up plan:   No follow-ups on file.     Interventional Therapies  Risk  Complexity Considerations:   Estimated body mass index is 27.44 kg/m as calculated from the following:   Height as of this encounter: $RemoveBeforeD'5\' 6"'[id number]$  (1.676 m).   Weight as of this encounter: 170 lb (77.1 kg). WNL   Planned  Pending:   The patient had a bad experience with another physician receiving blocks.   Under consideration:   Diagnostic bilateral lumbar facet block  Possible bilateral lumbar facet RFA  Diagnostic right L5-S1 LESI  Possible right L5 vs S1 TFESI  Diagnostic bilateral IA knee injections (w/ steroid)  Possible  series of 5 IA Hyalgan knee injections  Diagnostic bilateral Genicular NB  Possible bilateral Genicular nerve RFA    Completed:   None.   Therapeutic  Palliative (PRN) options:   None.     Recent Visits No visits were found meeting these conditions. Showing recent visits within past 90 days and meeting all other requirements Future Appointments Date Type Provider Dept  07/14/21 Appointment Milinda Pointer, MD [facility]  Showing future appointments within next 90 days and meeting all other requirements  I discussed the assessment and treatment plan with the patient. The patient was provided an opportunity to ask  questions and all were answered. The patient agreed with the plan and demonstrated an understanding of the instructions.  Patient advised to call back or seek an in-person evaluation if the symptoms or condition worsens.  Duration of encounter: *** minutes.  Note by: Gaspar Cola, MD Date: 07/14/2021; Time: 7:51 AM

## 2021-07-14 ENCOUNTER — Encounter: Payer: Self-pay | Admitting: Pain Medicine

## 2021-07-14 ENCOUNTER — Ambulatory Visit: Payer: 59 | Attending: Pain Medicine | Admitting: Pain Medicine

## 2021-07-14 VITALS — BP 128/87 | HR 98 | Temp 97.1°F | Ht 67.0 in | Wt 170.0 lb

## 2021-07-14 DIAGNOSIS — M79605 Pain in left leg: Secondary | ICD-10-CM | POA: Insufficient documentation

## 2021-07-14 DIAGNOSIS — Z79891 Long term (current) use of opiate analgesic: Secondary | ICD-10-CM | POA: Insufficient documentation

## 2021-07-14 DIAGNOSIS — G8929 Other chronic pain: Secondary | ICD-10-CM | POA: Insufficient documentation

## 2021-07-14 DIAGNOSIS — M25512 Pain in left shoulder: Secondary | ICD-10-CM | POA: Diagnosis present

## 2021-07-14 DIAGNOSIS — M79604 Pain in right leg: Secondary | ICD-10-CM | POA: Insufficient documentation

## 2021-07-14 DIAGNOSIS — G894 Chronic pain syndrome: Secondary | ICD-10-CM | POA: Insufficient documentation

## 2021-07-14 DIAGNOSIS — Z79899 Other long term (current) drug therapy: Secondary | ICD-10-CM | POA: Diagnosis present

## 2021-07-14 DIAGNOSIS — M25562 Pain in left knee: Secondary | ICD-10-CM | POA: Diagnosis present

## 2021-07-14 DIAGNOSIS — M25561 Pain in right knee: Secondary | ICD-10-CM | POA: Insufficient documentation

## 2021-07-14 DIAGNOSIS — M545 Low back pain, unspecified: Secondary | ICD-10-CM | POA: Diagnosis present

## 2021-07-14 DIAGNOSIS — M961 Postlaminectomy syndrome, not elsewhere classified: Secondary | ICD-10-CM | POA: Insufficient documentation

## 2021-07-14 DIAGNOSIS — M25511 Pain in right shoulder: Secondary | ICD-10-CM | POA: Insufficient documentation

## 2021-07-14 DIAGNOSIS — R9431 Abnormal electrocardiogram [ECG] [EKG]: Secondary | ICD-10-CM | POA: Diagnosis present

## 2021-07-14 DIAGNOSIS — M7918 Myalgia, other site: Secondary | ICD-10-CM | POA: Diagnosis present

## 2021-07-14 DIAGNOSIS — M542 Cervicalgia: Secondary | ICD-10-CM | POA: Diagnosis present

## 2021-07-14 MED ORDER — OXYCODONE HCL 10 MG PO TABS: 10.0000 mg | ORAL_TABLET | Freq: Four times a day (QID) | ORAL | 0 refills | Status: DC | PRN

## 2021-07-14 MED ORDER — OXYCODONE HCL 10 MG PO TABS
10.0000 mg | ORAL_TABLET | Freq: Four times a day (QID) | ORAL | 0 refills | Status: DC | PRN
Start: 2021-09-20 — End: 2021-10-12

## 2021-07-14 NOTE — Patient Instructions (Signed)
____________________________________________________________________________________________  Medication Rules  Purpose: To inform patients, and their family members, of our rules and regulations.  Applies to: All patients receiving prescriptions (written or electronic).  Pharmacy of record: Pharmacy where electronic prescriptions will be sent. If written prescriptions are taken to a different pharmacy, please inform the nursing staff. The pharmacy listed in the electronic medical record should be the one where you would like electronic prescriptions to be sent.  Electronic prescriptions: In compliance with the [place] Strengthen Opioid Misuse Prevention (STOP) Act of 2017 (Session Law 2017-74/H243), effective February 02, 2018, all controlled substances must be electronically prescribed. Calling prescriptions to the pharmacy will cease to exist.  Prescription refills: Only during scheduled appointments. Applies to all prescriptions.  NOTE: The following applies primarily to controlled substances (Opioid* Pain Medications).   Type of encounter (visit): For patients receiving controlled substances, face-to-face visits are required. (Not an option or up to the patient.)  Patient's responsibilities: Pain Pills: Bring all pain pills to every appointment (except for procedure appointments). Pill Bottles: Bring pills in original pharmacy bottle. Always bring the newest bottle. Bring bottle, even if empty. Medication refills: You are responsible for knowing and keeping track of what medications you take and those you need refilled. The day before your appointment: write a list of all prescriptions that need to be refilled. The day of the appointment: give the list to the admitting nurse. Prescriptions will be written only during appointments. No prescriptions will be written on procedure days. If you forget a medication: it will not be "Called in", "Faxed", or "electronically sent". You will  need to get another appointment to get these prescribed. No early refills. Do not call asking to have your prescription filled early. Prescription Accuracy: You are responsible for carefully inspecting your prescriptions before leaving our office. Have the discharge nurse carefully go over each prescription with you, before taking them home. Make sure that your name is accurately spelled, that your address is correct. Check the name and dose of your medication to make sure it is accurate. Check the number of pills, and the written instructions to make sure they are clear and accurate. Make sure that you are given enough medication to last until your next medication refill appointment. Taking Medication: Take medication as prescribed. When it comes to controlled substances, taking less pills or less frequently than prescribed is permitted and encouraged. Never take more pills than instructed. Never take medication more frequently than prescribed.  Inform other Doctors: Always inform, all of your healthcare providers, of all the medications you take. Pain Medication from other Providers: You are not allowed to accept any additional pain medication from any other Doctor or Healthcare provider. There are two exceptions to this rule. (see below) In the event that you require additional pain medication, you are responsible for notifying us, as stated below. Cough Medicine: Often these contain an opioid, such as codeine or hydrocodone. Never accept or take cough medicine containing these opioids if you are already taking an opioid* medication. The combination may cause respiratory failure and death. Medication Agreement: You are responsible for carefully reading and following our Medication Agreement. This must be signed before receiving any prescriptions from our practice. Safely store a copy of your signed Agreement. Violations to the Agreement will result in no further prescriptions. (Additional copies of our  Medication Agreement are available upon request.) Laws, Rules, & Regulations: All patients are expected to follow all Federal and State Laws, Statutes, Rules, & Regulations. Ignorance of   the Laws does not constitute a valid excuse.  Illegal drugs and Controlled Substances: The use of illegal substances (including, but not limited to marijuana and its derivatives) and/or the illegal use of any controlled substances is strictly prohibited. Violation of this rule may result in the immediate and permanent discontinuation of any and all prescriptions being written by our practice. The use of any illegal substances is prohibited. Adopted CDC guidelines & recommendations: Target dosing levels will be at or below 60 MME/day. Use of benzodiazepines** is not recommended.  Exceptions: There are only two exceptions to the rule of not receiving pain medications from other Healthcare Providers. Exception #1 (Emergencies): In the event of an emergency (i.e.: accident requiring emergency care), you are allowed to receive additional pain medication. However, you are responsible for: As soon as you are able, call our office [phone], at any time of the day or night, and leave a message stating your name, the date and nature of the emergency, and the name and dose of the medication prescribed. In the event that your call is answered by a member of our staff, make sure to document and save the date, time, and the name of the person that took your information.  Exception #2 (Planned Surgery): In the event that you are scheduled by another doctor or dentist to have any type of surgery or procedure, you are allowed (for a period no longer than 30 days), to receive additional pain medication, for the acute post-op pain. However, in this case, you are responsible for picking up a copy of our "Post-op Pain Management for Surgeons" handout, and giving it to your surgeon or dentist. This document is available at our office, and  does not require an appointment to obtain it. Simply go to our office during business hours (Monday-Thursday from 8:00 AM to 4:00 PM) (Friday 8:00 AM to 12:00 Noon) or if you have a scheduled appointment with us, prior to your surgery, and ask for it by name. In addition, you are responsible for: calling our office [phone], at any time of the day or night, and leaving a message stating your name, name of your surgeon, type of surgery, and date of procedure or surgery. Failure to comply with your responsibilities may result in termination of therapy involving the controlled substances. Medication Agreement Violation. Following the above rules, including your responsibilities will help you in avoiding a Medication Agreement Violation ("Breaking your Pain Medication Contract").  *Opioid medications include: morphine, codeine, oxycodone, oxymorphone, hydrocodone, hydromorphone, meperidine, tramadol, tapentadol, buprenorphine, fentanyl, methadone. **Benzodiazepine medications include: diazepam (Valium), alprazolam (Xanax), clonazepam (Klonopine), lorazepam (Ativan), clorazepate (Tranxene), chlordiazepoxide (Librium), estazolam (Prosom), oxazepam (Serax), temazepam (Restoril), triazolam (Halcion) (Last updated: 10/30/2020) ____________________________________________________________________________________________  ____________________________________________________________________________________________  Medication Recommendations and Reminders  Applies to: All patients receiving prescriptions (written and/or electronic).  Medication Rules & Regulations: These rules and regulations exist for your safety and that of others. They are not flexible and neither are we. Dismissing or ignoring them will be considered "non-compliance" with medication therapy, resulting in complete and irreversible termination of such therapy. (See document titled "Medication Rules" for more details.) In all conscience,  because of safety reasons, we cannot continue providing a therapy where the patient does not follow instructions.  Pharmacy of record:  Definition: This is the pharmacy where your electronic prescriptions will be sent.  We do not endorse any particular pharmacy, however, we have experienced problems with [facility] not securing enough medication supply for the community. We do not restrict you   in your choice of pharmacy. However, once we write for your prescriptions, we will NOT be re-sending more prescriptions to fix restricted supply problems created by your pharmacy, or your insurance.  The pharmacy listed in the electronic medical record should be the one where you want electronic prescriptions to be sent. If you choose to change pharmacy, simply notify our nursing staff.  Recommendations: Keep all of your pain medications in a safe place, under lock and key, even if you live alone. We will NOT replace lost, stolen, or damaged medication. After you fill your prescription, take 1 week's worth of pills and put them away in a safe place. You should keep a separate, properly labeled bottle for this purpose. The remainder should be kept in the original bottle. Use this as your primary supply, until it runs out. Once it's gone, then you know that you have 1 week's worth of medicine, and it is time to come in for a prescription refill. If you do this correctly, it is unlikely that you will ever run out of medicine. To make sure that the above recommendation works, it is very important that you make sure your medication refill appointments are scheduled at least 1 week before you run out of medicine. To do this in an effective manner, make sure that you do not leave the office without scheduling your next medication management appointment. Always ask the nursing staff to show you in your prescription , when your medication will be running out. Then arrange for the receptionist to get you a return appointment,  at least 7 days before you run out of medicine. Do not wait until you have 1 or 2 pills left, to come in. This is very poor planning and does not take into consideration that we may need to cancel appointments due to bad weather, sickness, or emergencies affecting our staff. DO NOT ACCEPT A "Partial Fill": If for any reason your pharmacy does not have enough pills/tablets to completely fill or refill your prescription, do not allow for a "partial fill". The law allows the pharmacy to complete that prescription within 72 hours, without requiring a new prescription. If they do not fill the rest of your prescription within those 72 hours, you will need a separate prescription to fill the remaining amount, which we will NOT provide. If the reason for the partial fill is your insurance, you will need to talk to the pharmacist about payment alternatives for the remaining tablets, but again, DO NOT ACCEPT A PARTIAL FILL, unless you can trust your pharmacist to obtain the remainder of the pills within 72 hours.  Prescription refills and/or changes in medication(s):  Prescription refills, and/or changes in dose or medication, will be conducted only during scheduled medication management appointments. (Applies to both, written and electronic prescriptions.) No refills on procedure days. No medication will be changed or started on procedure days. No changes, adjustments, and/or refills will be conducted on a procedure day. Doing so will interfere with the diagnostic portion of the procedure. No phone refills. No medications will be "called into the pharmacy". No Fax refills. No weekend refills. No Holliday refills. No after hours refills.  Remember:  Business hours are:  Monday to Thursday 8:00 AM to 4:00 PM Provider's Schedule: Eleri Ruben, MD - Appointments are:  Medication management: Monday and Wednesday 8:00 AM to 4:00 PM Procedure day: Tuesday and Thursday 7:30 AM to 4:00 PM Bilal Lateef, MD -  Appointments are:  Medication management: Tuesday and Thursday 8:00   AM to 4:00 PM Procedure day: Monday and Wednesday 7:30 AM to 4:00 PM (Last update: 08/23/2019) ____________________________________________________________________________________________  ____________________________________________________________________________________________  CBD (cannabidiol) & Delta-8 (Delta-8 tetrahydrocannabinol) WARNING  Intro: Cannabidiol (CBD) and tetrahydrocannabinol (THC), are two natural compounds found in plants of the Cannabis genus. They can both be extracted from hemp or cannabis. Hemp and cannabis come from the Cannabis sativa plant. Both compounds interact with your body's endocannabinoid system, but they have very different effects. CBD does not produce the high sensation associated with cannabis. Delta-8 tetrahydrocannabinol, also known as delta-8 THC, is a psychoactive substance found in the Cannabis sativa plant, of which marijuana and hemp are two varieties. THC is responsible for the high associated with the illicit use of marijuana.  Applicable to: All individuals currently taking or considering taking CBD (cannabidiol) and, more important, all patients taking opioid analgesic controlled substances (pain medication). (Example: oxycodone; oxymorphone; hydrocodone; hydromorphone; morphine; methadone; tramadol; tapentadol; fentanyl; buprenorphine; butorphanol; dextromethorphan; meperidine; codeine; etc.)  Legal status: CBD remains a Schedule I drug prohibited for any use. CBD is illegal with one exception. In the [place], CBD has a limited Food and Drug Administration (FDA) approval for the treatment of two specific types of epilepsy disorders. Only one CBD product has been approved by the FDA for this purpose: "Epidiolex". FDA is aware that some companies are marketing products containing cannabis and cannabis-derived compounds in ways that violate the Federal Food, Drug and Cosmetic Act  (FD&C Act) and that may put the health and safety of consumers at risk. The FDA, a Federal agency, has not enforced the CBD status since 2018. UPDATE: (03/21/2021) The Drug Enforcement Agency (DEA) issued a letter stating that "delta" cannabinoids, including Delta-8-THCO and Delta-9-THCO, synthetically derived from hemp do not qualify as hemp and will be viewed as Schedule I drugs. (Schedule I drugs, substances, or chemicals are defined as drugs with no currently accepted medical use and a high potential for abuse. Some examples of Schedule I drugs are: heroin, lysergic acid diethylamide (LSD), marijuana (cannabis), 3,4-methylenedioxymethamphetamine (ecstasy), methaqualone, and peyote.) (https://www.dea.gov)  Legality: Some manufacturers ship CBD products nationally, which is illegal. Often such products are sold online and are therefore available throughout the country. CBD is openly sold in head shops and health food stores in some states where such sales have not been explicitly legalized. Selling unapproved products with unsubstantiated therapeutic claims is not only a violation of the law, but also can put patients at risk, as these products have not been proven to be safe or effective. Federal illegality makes it difficult to conduct research on CBD.  Reference: "FDA Regulation of Cannabis and Cannabis-Derived Products, Including Cannabidiol (CBD)" - https://www.fda.gov/news-events/public-health-focus/fda-regulation-cannabis-and-cannabis-derived-products-including-cannabidiol-cbd  Warning: CBD is not FDA approved and has not undergo the same manufacturing controls as prescription drugs.  This means that the purity and safety of available CBD may be questionable. Most of the time, despite manufacturer's claims, it is contaminated with THC (delta-9-tetrahydrocannabinol - the chemical in marijuana responsible for the "HIGH").  When this is the case, the THC contaminant will trigger a positive urine drug  screen (UDS) test for Marijuana (carboxy-THC). Because a positive UDS for any illicit substance is a violation of our medication agreement, your opioid analgesics (pain medicine) may be permanently discontinued. The FDA recently put out a warning about 5 things that everyone should be aware of regarding Delta-8 THC: Delta-8 THC products have not been evaluated or approved by the FDA for safe use and may be marketed in ways that put the   public health at risk. The FDA has received adverse event reports involving delta-8 THC-containing products. Delta-8 THC has psychoactive and intoxicating effects. Delta-8 THC manufacturing often involve use of potentially harmful chemicals to create the concentrations of delta-8 THC claimed in the marketplace. The final delta-8 THC product may have potentially harmful by-products (contaminants) due to the chemicals used in the process. Manufacturing of delta-8 THC products may occur in uncontrolled or unsanitary settings, which may lead to the presence of unsafe contaminants or other potentially harmful substances. Delta-8 THC products should be kept out of the reach of children and pets.  MORE ABOUT CBD  General Information: CBD was discovered in 1940 and it is a derivative of the cannabis sativa genus plants (Marijuana and Hemp). It is one of the 113 identified substances found in Marijuana. It accounts for up to 40% of the plant's extract. As of 2018, preliminary clinical studies on CBD included research for the treatment of anxiety, movement disorders, and pain. CBD is available and consumed in multiple forms, including inhalation of smoke or vapor, as an aerosol spray, and by mouth. It may be supplied as an oil containing CBD, capsules, dried cannabis, or as a liquid solution. CBD is thought not to be as psychoactive as THC (delta-9-tetrahydrocannabinol - the chemical in marijuana responsible for the "HIGH"). Studies suggest that CBD may interact with different  biological target receptors in the body, including cannabinoid and other neurotransmitter receptors. As of 2018 the mechanism of action for its biological effects has not been determined.  Side-effects  Adverse reactions: Dry mouth, diarrhea, decreased appetite, fatigue, drowsiness, malaise, weakness, sleep disturbances, and others.  Drug interactions: CBC may interact with other medications such as blood-thinners. Because CBD causes drowsiness on its own, it also increases the drowsiness caused by other medications, including antihistamines (such as Benadryl), benzodiazepines (Xanax, Ativan, Valium), antipsychotics, antidepressants and opioids, as well as alcohol and supplements such as kava, melatonin and St. John's Wort. Be cautious with the following combinations:   Brivaracetam (Briviact) Brivaracetam is changed and broken down by the body. CBD might decrease how quickly the body breaks down brivaracetam. This might increase levels of brivaracetam in the body.  Caffeine Caffeine is changed and broken down by the body. CBD might decrease how quickly the body breaks down caffeine. This might increase levels of caffeine in the body.  Carbamazepine (Tegretol) Carbamazepine is changed and broken down by the body. CBD might decrease how quickly the body breaks down carbamazepine. This might increase levels of carbamazepine in the body and increase its side effects.  Citalopram (Celexa) Citalopram is changed and broken down by the body. CBD might decrease how quickly the body breaks down citalopram. This might increase levels of citalopram in the body and increase its side effects.  Clobazam (Onfi) Clobazam is changed and broken down by the liver. CBD might decrease how quickly the liver breaks down clobazam. This might increase the effects and side effects of clobazam.  Eslicarbazepine (Aptiom) Eslicarbazepine is changed and broken down by the body. CBD might decrease how quickly the body  breaks down eslicarbazepine. This might increase levels of eslicarbazepine in the body by a small amount.  Everolimus (Zostress) Everolimus is changed and broken down by the body. CBD might decrease how quickly the body breaks down everolimus. This might increase levels of everolimus in the body.  Lithium Taking higher doses of CBD might increase levels of lithium. This can increase the risk of lithium toxicity.  Medications changed by the liver (  Cytochrome P450 1A1 (CYP1A1) substrates) Some medications are changed and broken down by the liver. CBD might change how quickly the liver breaks down these medications. This could change the effects and side effects of these medications.  Medications changed by the liver (Cytochrome P450 1A2 (CYP1A2) substrates) Some medications are changed and broken down by the liver. CBD might change how quickly the liver breaks down these medications. This could change the effects and side effects of these medications.  Medications changed by the liver (Cytochrome P450 1B1 (CYP1B1) substrates) Some medications are changed and broken down by the liver. CBD might change how quickly the liver breaks down these medications. This could change the effects and side effects of these medications.  Medications changed by the liver (Cytochrome P450 2A6 (CYP2A6) substrates) Some medications are changed and broken down by the liver. CBD might change how quickly the liver breaks down these medications. This could change the effects and side effects of these medications.  Medications changed by the liver (Cytochrome P450 2B6 (CYP2B6) substrates) Some medications are changed and broken down by the liver. CBD might change how quickly the liver breaks down these medications. This could change the effects and side effects of these medications.  Medications changed by the liver (Cytochrome P450 2C19 (CYP2C19) substrates) Some medications are changed and broken down by the liver.  CBD might change how quickly the liver breaks down these medications. This could change the effects and side effects of these medications.  Medications changed by the liver (Cytochrome P450 2C8 (CYP2C8) substrates) Some medications are changed and broken down by the liver. CBD might change how quickly the liver breaks down these medications. This could change the effects and side effects of these medications.  Medications changed by the liver (Cytochrome P450 2C9 (CYP2C9) substrates) Some medications are changed and broken down by the liver. CBD might change how quickly the liver breaks down these medications. This could change the effects and side effects of these medications.  Medications changed by the liver (Cytochrome P450 2D6 (CYP2D6) substrates) Some medications are changed and broken down by the liver. CBD might change how quickly the liver breaks down these medications. This could change the effects and side effects of these medications.  Medications changed by the liver (Cytochrome P450 2E1 (CYP2E1) substrates) Some medications are changed and broken down by the liver. CBD might change how quickly the liver breaks down these medications. This could change the effects and side effects of these medications.  Medications changed by the liver (Cytochrome P450 3A4 (CYP3A4) substrates) Some medications are changed and broken down by the liver. CBD might change how quickly the liver breaks down these medications. This could change the effects and side effects of these medications.  Medications changed by the liver (Glucuronidated drugs) Some medications are changed and broken down by the liver. CBD might change how quickly the liver breaks down these medications. This could change the effects and side effects of these medications.  Medications that decrease the breakdown of other medications by the liver (Cytochrome P450 2C19 (CYP2C19) inhibitors) CBD is changed and broken down by the liver.  Some drugs decrease how quickly the liver changes and breaks down CBD. This could change the effects and side effects of CBD.  Medications that decrease the breakdown of other medications in the liver (Cytochrome P450 3A4 (CYP3A4) inhibitors) CBD is changed and broken down by the liver. Some drugs decrease how quickly the liver changes and breaks down CBD. This could change the effects   and side effects of CBD.  Medications that increase breakdown of other medications by the liver (Cytochrome P450 3A4 (CYP3A4) inducers) CBD is changed and broken down by the liver. Some drugs increase how quickly the liver changes and breaks down CBD. This could change the effects and side effects of CBD.  Medications that increase the breakdown of other medications by the liver (Cytochrome P450 2C19 (CYP2C19) inducers) CBD is changed and broken down by the liver. Some drugs increase how quickly the liver changes and breaks down CBD. This could change the effects and side effects of CBD.  Methadone (Dolophine) Methadone is broken down by the liver. CBD might decrease how quickly the liver breaks down methadone. Taking cannabidiol along with methadone might increase the effects and side effects of methadone.  Rufinamide (Banzel) Rufinamide is changed and broken down by the body. CBD might decrease how quickly the body breaks down rufinamide. This might increase levels of rufinamide in the body by a small amount.  Sedative medications (CNS depressants) CBD might cause sleepiness and slowed breathing. Some medications, called sedatives, can also cause sleepiness and slowed breathing. Taking CBD with sedative medications might cause breathing problems and/or too much sleepiness.  Sirolimus (Rapamune) Sirolimus is changed and broken down by the body. CBD might decrease how quickly the body breaks down sirolimus. This might increase levels of sirolimus in the body.  Stiripentol (Diacomit) Stiripentol is changed and  broken down by the body. CBD might decrease how quickly the body breaks down stiripentol. This might increase levels of stiripentol in the body and increase its side effects.  Tacrolimus (Prograf) Tacrolimus is changed and broken down by the body. CBD might decrease how quickly the body breaks down tacrolimus. This might increase levels of tacrolimus in the body.  Tamoxifen (Soltamox) Tamoxifen is changed and broken down by the body. CBD might affect how quickly the body breaks down tamoxifen. This might affect levels of tamoxifen in the body.  Topiramate (Topamax) Topiramate is changed and broken down by the body. CBD might decrease how quickly the body breaks down topiramate. This might increase levels of topiramate in the body by a small amount.  Valproate Valproic acid can cause liver injury. Taking cannabidiol with valproic acid might increase the chance of liver injury. CBD and/or valproic acid might need to be stopped, or the dose might need to be reduced.  Warfarin (Coumadin) CBD might increase levels of warfarin, which can increase the risk for bleeding. CBD and/or warfarin might need to be stopped, or the dose might need to be reduced.  Zonisamide Zonisamide is changed and broken down by the body. CBD might decrease how quickly the body breaks down zonisamide. This might increase levels of zonisamide in the body by a small amount. (Last update: 04/02/2021) ____________________________________________________________________________________________  ____________________________________________________________________________________________  Drug Holidays (Slow)  What is a "Drug Holiday"? Drug Holiday: is the name given to the period of time during which a patient stops taking a medication(s) for the purpose of eliminating tolerance to the drug.  Benefits Improved effectiveness of opioids. Decreased opioid dose needed to achieve benefits. Improved pain with lesser  dose.  What is tolerance? Tolerance: is the progressive decreased in effectiveness of a drug due to its repetitive use. With repetitive use, the body gets use to the medication and as a consequence, it loses its effectiveness. This is a common problem seen with opioid pain medications. As a result, a larger dose of the drug is needed to achieve the same effect that   used to be obtained with a smaller dose.  How long should a "Drug Holiday" last? You should stay off of the pain medicine for at least 14 consecutive days. (2 weeks)  Should I stop the medicine "cold turkey"? No. You should always coordinate with your Pain Specialist so that he/she can provide you with the correct medication dose to make the transition as smoothly as possible.  How do I stop the medicine? Slowly. You will be instructed to decrease the daily amount of pills that you take by one (1) pill every seven (7) days. This is called a "slow downward taper" of your dose. For example: if you normally take four (4) pills per day, you will be asked to drop this dose to three (3) pills per day for seven (7) days, then to two (2) pills per day for seven (7) days, then to one (1) per day for seven (7) days, and at the end of those last seven (7) days, this is when the "Drug Holiday" would start.   Will I have withdrawals? By doing a "slow downward taper" like this one, it is unlikely that you will experience any significant withdrawal symptoms. Typically, what triggers withdrawals is the sudden stop of a high dose opioid therapy. Withdrawals can usually be avoided by slowly decreasing the dose over a prolonged period of time. If you do not follow these instructions and decide to stop your medication abruptly, withdrawals may be possible.  What are withdrawals? Withdrawals: refers to the wide range of symptoms that occur after stopping or dramatically reducing opiate drugs after heavy and prolonged use. Withdrawal symptoms do not occur to  patients that use low dose opioids, or those who take the medication sporadically. Contrary to benzodiazepine (example: Valium, Xanax, etc.) or alcohol withdrawals ("Delirium Tremens"), opioid withdrawals are not lethal. Withdrawals are the physical manifestation of the body getting rid of the excess receptors.  Expected Symptoms Early symptoms of withdrawal may include: Agitation Anxiety Muscle aches Increased tearing Insomnia Runny nose Sweating Yawning  Late symptoms of withdrawal may include: Abdominal cramping Diarrhea Dilated pupils Goose bumps Nausea Vomiting  Will I experience withdrawals? Due to the slow nature of the taper, it is very unlikely that you will experience any.  What is a slow taper? Taper: refers to the gradual decrease in dose.  (Last update: 08/23/2019) ____________________________________________________________________________________________    

## 2021-07-14 NOTE — Progress Notes (Signed)
Nursing Pain Medication Assessment:  Safety precautions to be maintained throughout the outpatient stay will include: orient to surroundings, keep bed in low position, maintain call bell within reach at all times, provide assistance with transfer out of bed and ambulation.  Medication Inspection Compliance: Pill count conducted under aseptic conditions, in front of the patient. Neither the pills nor the bottle was removed from the patient's sight at any time. Once count was completed pills were immediately returned to the patient in their original bottle.  Medication: Oxycodone IR Pill/Patch Count:  34 of 120 pills remain Pill/Patch Appearance: Markings consistent with prescribed medication Bottle Appearance: Standard pharmacy container. Clearly labeled. Filled Date: 5 / 20 / 2023 Last Medication intake:  TodaySafety precautions to be maintained throughout the outpatient stay will include: orient to surroundings, keep bed in low position, maintain call bell within reach at all times, provide assistance with transfer out of bed and ambulation.

## 2021-07-19 LAB — TOXASSURE SELECT 13 (MW), URINE

## 2021-10-12 NOTE — Progress Notes (Unsigned)
PROVIDER NOTE: Information contained herein reflects review and annotations entered in association with encounter. Interpretation of such information and data should be left to medically-trained personnel. Information provided to patient can be located elsewhere in the medical record under "Patient Instructions". Document created using STT-dictation technology, any transcriptional errors that may result from process are unintentional.    Patient: Holly Spears  Service Category: E/M  Provider: Gaspar Cola, MD  DOB: February 05, 1970  DOS: 10/13/2021  Referring Provider: Mike Craze  MRN: [id number]  Specialty: Interventional Pain Management  PCP: Lonzo Cloud, PA-C  Type: Established Patient  Setting: Ambulatory outpatient    Location: Office  Delivery: Face-to-face     HPI  [name], a 51 y.o. year old female, is here today because of her Right cervical radiculopathy [M54.12]. [name]'s primary complain today is Back Pain (Mid/lower) Last encounter: My last encounter with her was on 07/14/2021. Pertinent problems: 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; Lumbar spondylosis (L3-4, L4-5, and L5-S1); Lumbar bulging disc (L3-4, L4-5, and L5-S1); Chronic lumbar radicular pain (Bilateral) (R>L); Chronic knee pain (3ry area of Pain) (Bilateral) (R>L); Chronic upper back pain (Left); Thoracic disc herniation (Large, Left paracentral T10-11 disc  herniation); Thoracic spinal stenosis (Severe, Left, T10-11 Lateral Recess Stenosis); Thoracic foraminal stenosis (Severe Left T10-11); History of bilateral carpal tunnel release (Bilateral); Chronic hand pain (Bilateral) (R>L); Muscle weakness; Chronic lower extremity pain (2ry area of Pain) (Bilateral); Neck pain; Spasm of back muscles; Myalgia and myositis; Fibromyalgia; Sjogren's syndrome (HCC); DDD (degenerative disc disease), cervical; and Right cervical radiculopathy (C8) on their pertinent problem list. Pain Assessment: Severity of Chronic pain is reported as a 5 /10. Location: Back Lower/right leg to the heel. Onset: More than a month ago. Quality: Aching. Timing: Constant. Modifying factor(s): heat, Oxycodone. Vitals:  height is 5' 6"  (1.676 m) and weight is 160 lb (72.6 kg). Her temporal temperature is 97.3 F (36.3 C) (abnormal). Her blood pressure is 148/97 (abnormal) and her pulse is 102 (abnormal). Her respiration is 18 and oxygen saturation is 100%.   Reason for encounter: medication management.  The patient indicates doing well with the current medication regimen. No adverse reactions or side effects reported to the medications.   For the past 4 weeks the patient has been experiencing new symptoms of neck pain and right upper extremity pain going all the way into her ring finger and pinky finger.  She refers having pain, numbness, and weakness.  Reviewing her cervical spine imaging, the most recent 1 was done approximately a year ago and it was plain x-rays with flexion-extension that at the time indicated the patient to have a degenerative disc disease primarily affecting the C6/C7 level with no spinal instability.  The patient indicates having received a steroid taper which has not helped the pain.  Clearly this is a new C8 radiculopathy/radiculitis.  I will be ordering an MRI of the cervical spine and I will also tentatively schedule her for a right cervical epidural steroid injection  under fluoroscopic guidance.  RTCB: 01/18/2022 Nonopioids transferred  11/22/2019: Flexeril  Pharmacotherapy Assessment  Analgesic: Oxycodone IR 10 mg, 1 tab PO q 6 hrs (40 mg/day of oxycodone) MME/day: 60 mg/day.   Monitoring: [place] PMP: PDMP reviewed during this encounter.       Pharmacotherapy: No side-effects or adverse reactions reported. Compliance: No problems identified. Effectiveness: Clinically acceptable.  [name], RN  10/13/2021  8:34 AM  Sign when Signing Visit Nursing Pain Medication Assessment:  Safety precautions to be maintained throughout the outpatient stay will include: orient to surroundings, keep bed in low position, maintain call bell within reach at all times, provide assistance with transfer out of bed and ambulation.  Medication Inspection Compliance: Pill count conducted under aseptic conditions, in front of the patient. Neither the pills nor the bottle was removed from the patient's sight at any time. Once count was completed pills were immediately returned to the patient in their original bottle.  Medication: Oxycodone IR Pill/Patch Count:  31 of 120 pills remain Pill/Patch Appearance: Markings consistent with prescribed medication Bottle Appearance: Standard pharmacy container. Clearly labeled. Filled Date: 08 / 19 / 2023 Last Medication intake:  Today    No results found for: "CBDTHCR" No results found for: "D8THCCBX" No results found for: "D9THCCBX"  UDS:  Summary  Date Value Ref Range Status  07/14/2021 Note  Final    Comment:    ==================================================================== [facility] (MW) ==================================================================== Test                             Result       Flag       Units  Drug Present and Declared for Prescription Verification   Oxycodone                      749          EXPECTED   ng/mg creat   Oxymorphone                    2496         EXPECTED   ng/mg  creat   Noroxycodone                   6964         EXPECTED   ng/mg creat   Noroxymorphone                 1100         EXPECTED   ng/mg creat    Sources of oxycodone are scheduled prescription medications.    Oxymorphone, noroxycodone, and noroxymorphone are expected    metabolites of oxycodone. Oxymorphone is also available as a    scheduled prescription medication.  ==================================================================== Test                      Result    Flag   Units      Ref Range   Creatinine              47               mg/dL      >=20 ==================================================================== Declared Medications:  The flagging and interpretation on this report are based on the  following declared medications.  Unexpected results may arise from  inaccuracies in the declared medications.   **Note: The testing scope of this panel includes these medications:   Oxycodone   **Note: The testing  scope of this panel does not include the  following reported medications:   Biotin  Bupropion (Wellbutrin)  Calcium  Citalopram (Celexa)  Cyclobenzaprine (Flexeril)  Epinephrine (EpiPen)  Famotidine (Pepcid)  Fluoride (Prevident)  Hydroxychloroquine (Plaquenil)  Ibuprofen (Advil)  Levonorgestrel (Mirena)  Levothyroxine (Synthroid)  Magnesium  Multivitamin  Naloxone (Narcan)  Potassium (Klor-Con)  Propranolol (Inderal)  Vitamin D  Vitamin D3  Zinc ==================================================================== For clinical consultation, please call [phone]. ====================================================================       ROS  Constitutional: Denies any fever or chills Gastrointestinal: No reported hemesis, hematochezia, vomiting, or acute GI distress Musculoskeletal: Denies any acute onset joint swelling, redness, loss of ROM, or weakness Neurological: No reported episodes of acute onset apraxia, aphasia, dysarthria,  agnosia, amnesia, paralysis, loss of coordination, or loss of consciousness  Medication Review  Biotin, Calcium Carbonate-Vitamin D, EPINEPHrine, Magnesium, Multi-Vitamins, Oxycodone HCl, Sodium Fluoride, Vitamin D3, Zinc, buPROPion, citalopram, cyclobenzaprine, famotidine, hydroxychloroquine, ibuprofen, levothyroxine, naloxone, and potassium chloride SA  History Review  Allergy: Ms. Blow is allergic to bee venom, penicillins, shellfish allergy, sulfa antibiotics, erythromycin, zaleplon, codeine, and trazodone. Drug: Ms. Kirkeby  reports no history of drug use. Alcohol:  reports no history of alcohol use. Tobacco:  reports that she has quit smoking. She has never used smokeless tobacco. Social: Ms. Henegar  reports that she has quit smoking. She has never used smokeless tobacco. She reports that she does not drink alcohol and does not use drugs. Medical:  has a past medical history of Acid reflux (12/15/2011), Anxiety, generalized (12/15/2011), Arthritis, Cervical pain (12/15/2011), Depression, GERD (gastroesophageal reflux disease), Hiatal hernia, LBP (low back pain) (12/15/2011), Migraine, Muscle ache (12/15/2011), Osteopenia of left hip, Pain (12/27/2012), Rheumatoid arthritis ([place]) (12/15/2011), and Scratched cornea (10/25/2015). Surgical: Ms. January  has a past surgical history that includes Cholecystectomy and Abdominal hysterectomy. Family: family history includes Depression in her mother; Diabetes in her father; Hyperlipidemia in her mother; Hypertension in her father and mother.  Laboratory Chemistry Profile   Renal Lab Results  Component Value Date   BUN 6 03/03/2018   CREATININE 0.63 03/03/2018   BCR 10 03/03/2018   GFRAA 123 03/03/2018   GFRNONAA 106 03/03/2018    Hepatic Lab Results  Component Value Date   AST 21 03/03/2018   ALT 26 03/27/2015   ALBUMIN 4.6 03/03/2018   ALKPHOS 71 03/03/2018    Electrolytes Lab Results  Component Value Date   NA 136 03/03/2018   K  3.9 03/03/2018   CL 97 03/03/2018   CALCIUM 8.9 03/03/2018   MG 2.1 03/03/2018    Bone Lab Results  Component Value Date   VD25OH 29.3 (L) 12/31/2014    Inflammation (CRP: Acute Phase) (ESR: Chronic Phase) Lab Results  Component Value Date   CRP <1 03/03/2018   ESRSEDRATE 2 03/03/2018         Note: Above Lab results reviewed.  Recent Imaging Review  DG Lumbar Spine Complete W/Bend CLINICAL DATA:  Chronic pain, no injury Please comment in the report about spinal instability (>4 mm displacement), in addition to any: Spondylolisthesis (specify retro- or antero- and Grade level) (describe displacement in millimeters )  EXAM: LUMBAR SPINE - COMPLETE WITH BENDING VIEWS  COMPARISON:  None.  FINDINGS: There is mild degenerative change in the facet joints of the lower lumbar spine. No acute fracture or subluxation. Surgical clips are noted in the right upper quadrant the abdomen. Intrauterine device overlies the central pelvis.  IMPRESSION: Mild lower lumbar degenerative changes.  No spondylolisthesis.  Electronically  Signed   By: Nolon Nations M.D.   On: 02/18/2017 15:47 DG Thoracic Spine 2 View CLINICAL DATA:  Chronic pain, no injury Please comment in the report about spinal instability (>4 mm displacement), in addition to any: Spondylolisthesis (specify retro- or antero- and Grade level) (describe displacement in millimeters);  EXAM: THORACIC SPINE 2 VIEWS  COMPARISON:  None.  FINDINGS: There is mild S shaped scoliosis of the thoracic spine not associated with vertebral body anomalies. Mild degenerative changes are seen in the thoracic spine. No spondylolisthesis. Mild anterior wedge morphology noted at T11 is of indeterminate age. No acute fracture.  IMPRESSION: Mild midthoracic spondylosis.  No spondylolisthesis.  Mild anterior wedging of T11, age indeterminate.  Electronically Signed   By: Nolon Nations M.D.   On: 02/18/2017 15:44 Note:  Reviewed        Physical Exam  General appearance: Well nourished, well developed, and well hydrated. In no apparent acute distress Mental status: Alert, oriented x 3 (person, place, & time)       Respiratory: No evidence of acute respiratory distress Eyes: PERLA Vitals: BP (!) 148/97   Pulse (!) 102   Temp (!) 97.3 F (36.3 C) (Temporal)   Resp 18   Ht 5' 6"  (1.676 m)   Wt 160 lb (72.6 kg)   SpO2 100%   BMI 25.82 kg/m  BMI: Estimated body mass index is 25.82 kg/m as calculated from the following:   Height as of this encounter: 5' 6"  (1.676 m).   Weight as of this encounter: 160 lb (72.6 kg). Ideal: Ideal body weight: 59.3 kg (130 lb 11.7 oz) Adjusted ideal body weight: 64.6 kg (142 lb 7 oz)  Assessment   Diagnosis Status  1. Right cervical radiculopathy (C8)   2. Cervicalgia   3. Cervical spondylosis (C5-6 and C6-7)   4. Cervical disc herniation (C6-7) (Left)   5. DDD (degenerative disc disease), cervical   6. Chronic pain syndrome   7. Chronic low back pain (1ry area of Pain) (Bilateral) (R>L) w/o sciatica   8. Chronic lower extremity pain (2ry area of Pain) (Bilateral)   9. Chronic knee pain (3ry area of Pain) (Bilateral) (R>L)   10. Chronic neck pain (4th area of Pain)   11. Failed back surgical syndrome   12. Chronic shoulder pain (5th area of Pain) (Bilateral) (R>L)   13. Chronic musculoskeletal pain   14. Pharmacologic therapy   15. Chronic use of opiate for therapeutic purpose   16. Encounter for medication management   17. Encounter for chronic pain management   18. Chronic cervical radicular pain (Bilateral) (R>L)    Worsening Worsening Persistent   Updated Problems: Problem  Sjogren's Syndrome (Hcc)  Ddd (Degenerative Disc Disease), Cervical   C6-7   Right cervical radiculopathy (C8)    Plan of Care  Problem-specific:  No problem-specific Assessment & Plan notes found for this encounter.  Ms. Jaedin Trumbo has a current medication list which  includes the following long-term medication(s): calcium carbonate-vitamin d, citalopram, famotidine, levothyroxine, bupropion, cyclobenzaprine, naloxone, [START ON 10/20/2021] oxycodone hcl, [START ON 11/19/2021] oxycodone hcl, and [START ON 12/19/2021] oxycodone hcl.  Pharmacotherapy (Medications Ordered): Meds ordered this encounter  Medications   Oxycodone HCl 10 MG TABS    Sig: Take 1 tablet (10 mg total) by mouth every 6 (six) hours as needed. Must last 30 days    Dispense:  120 tablet    Refill:  0    DO NOT: delete (not duplicate); no partial-fill (  will deny script to complete), no refill request (F/U required). DISPENSE: 1 day early if closed on fill date. WARN: No CNS-depressants within 8 hrs of med.   Oxycodone HCl 10 MG TABS    Sig: Take 1 tablet (10 mg total) by mouth every 6 (six) hours as needed. Must last 30 days    Dispense:  120 tablet    Refill:  0    DO NOT: delete (not duplicate); no partial-fill (will deny script to complete), no refill request (F/U required). DISPENSE: 1 day early if closed on fill date. WARN: No CNS-depressants within 8 hrs of med.   Oxycodone HCl 10 MG TABS    Sig: Take 1 tablet (10 mg total) by mouth every 6 (six) hours as needed. Must last 30 days    Dispense:  120 tablet    Refill:  0    DO NOT: delete (not duplicate); no partial-fill (will deny script to complete), no refill request (F/U required). DISPENSE: 1 day early if closed on fill date. WARN: No CNS-depressants within 8 hrs of med.   Orders:  Orders Placed This Encounter  Procedures   Cervical Epidural Injection    Sedation: Patient's choice. Purpose: Diagnostic/Therapeutic Indication(s): Radiculitis and cervicalgia associater with cervical degenerative disc disease.    Standing Status:   Future    Standing Expiration Date:   01/12/2022    Scheduling Instructions:     Procedure: Cervical Epidural Steroid Injection/Block     Level(s): C7-T1     Laterality: Right-sided     Timeframe:  As soon as schedule allows    Order Specific Question:   Where will this procedure be performed?    Answer:   [facility]    Comments:   by Dr. Dossie Arbour   MR CERVICAL SPINE WO CONTRAST    Patient presents with axial pain with possible radicular component. Please assist [place] in identifying specific level(s) and laterality of any additional findings such as: 1. Facet (Zygapophyseal) joint DJD (Hypertrophy, space narrowing, subchondral sclerosis, and/or osteophyte formation) 2. DDD and/or IVDD (Loss of disc height, desiccation, gas patterns, osteophytes, endplate sclerosis, or "Black disc disease") 3. Pars defects 4. Spondylolisthesis, spondylosis, and/or spondyloarthropathies (include Degree/Grade of displacement in mm) (stability) 5. Vertebral body Fractures (acute/chronic) (state percentage of collapse) 6. Demineralization (osteopenia/osteoporotic) 7. Bone pathology 8. Foraminal narrowing  9. Surgical changes 10. Central, Lateral Recess, and/or Foraminal Stenosis (include AP diameter of stenosis in mm) 11. Surgical changes (hardware type, status, and presence of fibrosis) 12. Modic Type Changes (MRI only) 13. IVDD (Disc bulge, protrusion, herniation, extrusion) (Level, laterality, extent)    Standing Status:   Future    Standing Expiration Date:   11/12/2021    Scheduling Instructions:     Imaging must be done as soon as possible. Inform patient that order will expire within 30 days and I will not renew it.    Order Specific Question:   What is the patient's sedation requirement?    Answer:   No Sedation    Order Specific Question:   Does the patient have a pacemaker or implanted devices?    Answer:   No    Order Specific Question:   Preferred imaging location?    Answer:   [facility] (table limit-350lbs)    Order Specific Question:   Call Results- Best Contact Number?    Answer:   [phone] ([facility])    Order Specific Question:   Radiology Contrast  Protocol - do NOT  remove file path    Answer:   [email or web address]   Follow-up plan:   Return for procedure (Clinic): (R) CESI #1.     Interventional Therapies  Risk  Complexity Considerations:   Estimated body mass index is 27.44 kg/m as calculated from the following:   Height as of this encounter: 5' 6"  (1.676 m).   Weight as of this encounter: 170 lb (77.1 kg). WNL   Planned  Pending:   The patient had a bad experience with another physician receiving blocks.   Under consideration:   Diagnostic bilateral lumbar facet block  Possible bilateral lumbar facet RFA  Diagnostic right L5-S1 LESI  Possible right L5 vs S1 TFESI  Diagnostic bilateral IA knee injections (w/ steroid)  Possible series of 5 IA Hyalgan knee injections  Diagnostic bilateral Genicular NB  Possible bilateral Genicular nerve RFA    Completed:   None.   Therapeutic  Palliative (PRN) options:   None.     Recent Visits No visits were found meeting these conditions. Showing recent visits within past 90 days and meeting all other requirements Today's Visits Date Type Provider Dept  10/13/21 Office Visit Milinda Pointer, MD [facility]  Showing today's visits and meeting all other requirements Future Appointments No visits were found meeting these conditions. Showing future appointments within next 90 days and meeting all other requirements  I discussed the assessment and treatment plan with the patient. The patient was provided an opportunity to ask questions and all were answered. The patient agreed with the plan and demonstrated an understanding of the instructions.  Patient advised to call back or seek an in-person evaluation if the symptoms or condition worsens.  Duration of encounter: 35 minutes.  Total time on encounter, as per AMA guidelines included both the face-to-face and non-face-to-face time personally spent by the physician and/or other qualified  health care professional(s) on the day of the encounter (includes time in activities that require the physician or other qualified health care professional and does not include time in activities normally performed by clinical staff). Physician's time may include the following activities when performed: preparing to see the patient (eg, review of tests, pre-charting review of records) obtaining and/or reviewing separately obtained history performing a medically appropriate examination and/or evaluation counseling and educating the patient/family/caregiver ordering medications, tests, or procedures referring and communicating with other health care professionals (when not separately reported) documenting clinical information in the electronic or other health record independently interpreting results (not separately reported) and communicating results to the patient/ family/caregiver care coordination (not separately reported)  Note by: Gaspar Cola, MD Date: 10/13/2021; Time: 9:06 AM

## 2021-10-12 NOTE — Patient Instructions (Incomplete)
____________________________________________________________________________________________  Pharmacy Shortages of Pain Medication   Introduction Shockingly as it may seem, .  "No [place] Supreme Court decision has ever interpreted the Constitution as guaranteeing a right to health care for all Americans." - https://www.healthequityandpolicylab.com/elusive-right-to-health-care-under-us-law  "With respect to human rights, the [place] has no formally codified right to health, nor does it participate in a human rights treaty that specifies a right to health." - Scott J. Schweikart, JD, MBE  Situation By now, most of our patients have had the experience of being told by their pharmacist that they do not have enough medication to cover their prescription. If you have not had this experience, just know that you soon will.  Problem There appears to be a shortage of these medications, either at the national level or locally. This is happening with all pharmacies. When there is not enough medication, patients are offered a partial fill and they are told that they will try to get the rest of the medicine for them at a later time. If they do not have enough for even a partial fill, the pharmacists are telling the patients to call us (the prescribing physicians) to request that we send another prescription to another pharmacy to get the medicine.   This reordering of a controlled substance creates documentation problems where additional paperwork needs to be created to explain why two prescriptions for the same period of time and the same medicine are being prescribed to the same patient. It also creates situations where the last appointment note does not accurately reflect when and what prescriptions were given to a patient. This leads to prescribing errors down the line, in subsequent follow-up visits.   [place] Board [employer] Pharmacy ([place]) Research revealed that Board of Pharmacy Rule .1806 (21  NCAC 46.1806) authorizes pharmacists to the transfer of prescriptions among pharmacies, and it sets forth procedural and recordkeeping requirements for doing so. However, this requires the pharmacist to complete the previously mentioned procedural paperwork to accomplish the transfer. As it turns out, it is much easier for them to have the prescribing physicians do the work.   Possible solutions 1. You can ask your physician to assist you in weaning yourself off these medications. 2. Ask your pharmacy if the medication is in stock, 3 days prior to your refill. 3. If you need a pharmacy change, let us know at your medication management visit. Prescriptions that have already been electronically sent to a pharmacy will not be re-sent to a different pharmacy if your pharmacy of record does not have it in stock. Proper stocking of medication is a pharmacy problem, not a prescriber problem. Work with your pharmacist to solve the problem. 4. Have the [place] Assembly add a provision to the "STOP ACT" (the law that mandates how controlled substances are prescribed) where there is an exception to the electronic prescribing rule that states that in the event there are shortages of medications the physicians are allowed to use written prescriptions as opposed to electronic ones. This would allow patients to take their prescriptions to a different pharmacy that may have enough medication available to fill the prescription. The problem is that currently there is a law that does not allow for written prescriptions, with the exception of instances where the electronic medical record is down due to technical issues.  5. Have [place] Congress ease the pressure on pharmaceutical companies, allowing them to produce enough quantities of the medication to adequately supply the population. 6. Have pharmacies keep enough   stocks of these medications to cover their client base.  7. Have the [place] State Assembly add  a provision to the "STOP ACT" where they ease the regulations surrounding the transfer of controlled substances between pharmacies, so as to simplify the transfer of supplies. As an alternative, develop a system to allow patients to obtain the remainder of their prescription at another one of their pharmacies or at an associate pharmacy.   How this shortage will affect you.  Understand that this is a pharmacy supply problem, not a prescriber problem. Work with your pharmacy to solve it. The job of the prescriber is to evaluate and monitor the patient for the appropriate indications and use of these medicines. It is not the job of the prescriber to supply the medication or to solve problems with that supply. The responsibility and the choice to obtain the medication resides on the patient. By law, supplying the medication is the job of the pharmacy. It is certainly not the job of the prescriber to solve supply problems.   Due to the above problems we are no longer taking patients to write for their pain medication. Future discussions with your physician may include potentially weaning medications or transitioning to alternatives.  We will be focusing primarily on interventional based pain management. We will continue to evaluate for appropriate indications and we may provide recommendations regarding medication, dose, and schedule, as well as monitoring recommendations, however, we will not be taking over the actual prescribing of these substances. On those patients where we are treating their chronic pain with interventional therapies, exceptions will be considered on a case by case basis. At this time, we will try to continue providing this supplemental service to those patients we have been managing in the past. However, as of August 1st, 2023, we no longer will be sending additional prescriptions to other pharmacies for the purpose of solving their supply problems. Once we send a prescription to a pharmacy,  we will not be resending it again to another pharmacy to cover for their shortages.   What to do. Write as many letters as you can. Recruit the help of family members in writing these letters. Below are some of the places where you can write to make your voice heard. Let them know what the problem is and push them to look for solutions.   Search internet for: "[place] find your legislators" https://www.ncleg.gov/findyourlegislators  Search internet for: "[place] insurance commissioner complaints" https://www.ncdoi.gov/contactscomplaints/assistance-or-file-complaint  Search internet for: "[place] Board of Pharmacy complaints" http://www.ncbop.org/contact.htm  Search internet for: "[facility] pharmacy complaints" Email [facility] Customer Relations https://www.[facility].com/help/email-customer-relations.jsp?callType=store  Search internet for: "[facility] pharmacy customer service complaints" https://www.[facility].com/topic/marketing/contactus/contactus_customerservice.jsp  ____________________________________________________________________________________________  ____________________________________________________________________________________________  Medication Rules  Purpose: To inform patients, and their family members, of our rules and regulations.  Applies to: All patients receiving prescriptions (written or electronic).  Pharmacy of record: Pharmacy where electronic prescriptions will be sent. If written prescriptions are taken to a different pharmacy, please inform the nursing staff. The pharmacy listed in the electronic medical record should be the one where you would like electronic prescriptions to be sent.  Electronic prescriptions: In compliance with the [place] Strengthen Opioid Misuse Prevention (STOP) Act of 2017 (Session Law 2017-74/H243), effective February 02, 2018, all controlled substances must be electronically prescribed. Calling prescriptions  to the pharmacy will cease to exist.  Prescription refills: Only during scheduled appointments. Applies to all prescriptions.  NOTE: The following applies primarily to controlled substances (Opioid* Pain Medications).     Type of encounter (visit): For patients receiving controlled substances, face-to-face visits are required. (Not an option or up to the patient.)  Patient's responsibilities: Pain Pills: Bring all pain pills to every appointment (except for procedure appointments). Pill Bottles: Bring pills in original pharmacy bottle. Always bring the newest bottle. Bring bottle, even if empty. Medication refills: You are responsible for knowing and keeping track of what medications you take and those you need refilled. The day before your appointment: write a list of all prescriptions that need to be refilled. The day of the appointment: give the list to the admitting nurse. Prescriptions will be written only during appointments. No prescriptions will be written on procedure days. If you forget a medication: it will not be "Called in", "Faxed", or "electronically sent". You will need to get another appointment to get these prescribed. No early refills. Do not call asking to have your prescription filled early. Prescription Accuracy: You are responsible for carefully inspecting your prescriptions before leaving our office. Have the discharge nurse carefully go over each prescription with you, before taking them home. Make sure that your name is accurately spelled, that your address is correct. Check the name and dose of your medication to make sure it is accurate. Check the number of pills, and the written instructions to make sure they are clear and accurate. Make sure that you are given enough medication to last until your next medication refill appointment. Taking Medication: Take medication as prescribed. When it comes to controlled substances, taking less pills or less frequently than prescribed  is permitted and encouraged. Never take more pills than instructed. Never take medication more frequently than prescribed.  Inform other Doctors: Always inform, all of your healthcare providers, of all the medications you take. Pain Medication from other Providers: You are not allowed to accept any additional pain medication from any other Doctor or Healthcare provider. There are two exceptions to this rule. (see below) In the event that you require additional pain medication, you are responsible for notifying us, as stated below. Cough Medicine: Often these contain an opioid, such as codeine or hydrocodone. Never accept or take cough medicine containing these opioids if you are already taking an opioid* medication. The combination may cause respiratory failure and death. Medication Agreement: You are responsible for carefully reading and following our Medication Agreement. This must be signed before receiving any prescriptions from our practice. Safely store a copy of your signed Agreement. Violations to the Agreement will result in no further prescriptions. (Additional copies of our Medication Agreement are available upon request.) Laws, Rules, & Regulations: All patients are expected to follow all Federal and State Laws, Statutes, Rules, & Regulations. Ignorance of the Laws does not constitute a valid excuse.  Illegal drugs and Controlled Substances: The use of illegal substances (including, but not limited to marijuana and its derivatives) and/or the illegal use of any controlled substances is strictly prohibited. Violation of this rule may result in the immediate and permanent discontinuation of any and all prescriptions being written by our practice. The use of any illegal substances is prohibited. Adopted CDC guidelines & recommendations: Target dosing levels will be at or below 60 MME/day. Use of benzodiazepines** is not recommended.  Exceptions: There are only two exceptions to the rule of not  receiving pain medications from other Healthcare Providers. Exception #1 (Emergencies): In the event of an emergency (i.e.: accident requiring emergency care), you are allowed to receive additional pain medication. However, you are responsible for: As soon as   you are able, call our office [phone], at any time of the day or night, and leave a message stating your name, the date and nature of the emergency, and the name and dose of the medication prescribed. In the event that your call is answered by a member of our staff, make sure to document and save the date, time, and the name of the person that took your information.  Exception #2 (Planned Surgery): In the event that you are scheduled by another doctor or dentist to have any type of surgery or procedure, you are allowed (for a period no longer than 30 days), to receive additional pain medication, for the acute post-op pain. However, in this case, you are responsible for picking up a copy of our "Post-op Pain Management for Surgeons" handout, and giving it to your surgeon or dentist. This document is available at our office, and does not require an appointment to obtain it. Simply go to our office during business hours (Monday-Thursday from 8:00 AM to 4:00 PM) (Friday 8:00 AM to 12:00 Noon) or if you have a scheduled appointment with us, prior to your surgery, and ask for it by name. In addition, you are responsible for: calling our office [phone], at any time of the day or night, and leaving a message stating your name, name of your surgeon, type of surgery, and date of procedure or surgery. Failure to comply with your responsibilities may result in termination of therapy involving the controlled substances. Medication Agreement Violation. Following the above rules, including your responsibilities will help you in avoiding a Medication Agreement Violation ("Breaking your Pain Medication Contract").  *Opioid medications include: morphine,  codeine, oxycodone, oxymorphone, hydrocodone, hydromorphone, meperidine, tramadol, tapentadol, buprenorphine, fentanyl, methadone. **Benzodiazepine medications include: diazepam (Valium), alprazolam (Xanax), clonazepam (Klonopine), lorazepam (Ativan), clorazepate (Tranxene), chlordiazepoxide (Librium), estazolam (Prosom), oxazepam (Serax), temazepam (Restoril), triazolam (Halcion) (Last updated: 10/30/2020) ____________________________________________________________________________________________  ____________________________________________________________________________________________  Medication Recommendations and Reminders  Applies to: All patients receiving prescriptions (written and/or electronic).  Medication Rules & Regulations: These rules and regulations exist for your safety and that of others. They are not flexible and neither are we. Dismissing or ignoring them will be considered "non-compliance" with medication therapy, resulting in complete and irreversible termination of such therapy. (See document titled "Medication Rules" for more details.) In all conscience, because of safety reasons, we cannot continue providing a therapy where the patient does not follow instructions.  Pharmacy of record:  Definition: This is the pharmacy where your electronic prescriptions will be sent.  We do not endorse any particular pharmacy, however, we have experienced problems with [facility] not securing enough medication supply for the community. We do not restrict you in your choice of pharmacy. However, once we write for your prescriptions, we will NOT be re-sending more prescriptions to fix restricted supply problems created by your pharmacy, or your insurance.  The pharmacy listed in the electronic medical record should be the one where you want electronic prescriptions to be sent. If you choose to change pharmacy, simply notify our nursing staff.  Recommendations: Keep all of your pain  medications in a safe place, under lock and key, even if you live alone. We will NOT replace lost, stolen, or damaged medication. After you fill your prescription, take 1 week's worth of pills and put them away in a safe place. You should keep a separate, properly labeled bottle for this purpose. The remainder should be kept in the original bottle. Use this as your primary supply, until   it runs out. Once it's gone, then you know that you have 1 week's worth of medicine, and it is time to come in for a prescription refill. If you do this correctly, it is unlikely that you will ever run out of medicine. To make sure that the above recommendation works, it is very important that you make sure your medication refill appointments are scheduled at least 1 week before you run out of medicine. To do this in an effective manner, make sure that you do not leave the office without scheduling your next medication management appointment. Always ask the nursing staff to show you in your prescription , when your medication will be running out. Then arrange for the receptionist to get you a return appointment, at least 7 days before you run out of medicine. Do not wait until you have 1 or 2 pills left, to come in. This is very poor planning and does not take into consideration that we may need to cancel appointments due to bad weather, sickness, or emergencies affecting our staff. DO NOT ACCEPT A "Partial Fill": If for any reason your pharmacy does not have enough pills/tablets to completely fill or refill your prescription, do not allow for a "partial fill". The law allows the pharmacy to complete that prescription within 72 hours, without requiring a new prescription. If they do not fill the rest of your prescription within those 72 hours, you will need a separate prescription to fill the remaining amount, which we will NOT provide. If the reason for the partial fill is your insurance, you will need to talk to the pharmacist  about payment alternatives for the remaining tablets, but again, DO NOT ACCEPT A PARTIAL FILL, unless you can trust your pharmacist to obtain the remainder of the pills within 72 hours.  Prescription refills and/or changes in medication(s):  Prescription refills, and/or changes in dose or medication, will be conducted only during scheduled medication management appointments. (Applies to both, written and electronic prescriptions.) No refills on procedure days. No medication will be changed or started on procedure days. No changes, adjustments, and/or refills will be conducted on a procedure day. Doing so will interfere with the diagnostic portion of the procedure. No phone refills. No medications will be "called into the pharmacy". No Fax refills. No weekend refills. No Holliday refills. No after hours refills.  Remember:  Business hours are:  Monday to Thursday 8:00 AM to 4:00 PM Provider's Schedule: Romuald Mccaslin, MD - Appointments are:  Medication management: Monday and Wednesday 8:00 AM to 4:00 PM Procedure day: Tuesday and Thursday 7:30 AM to 4:00 PM Bilal Lateef, MD - Appointments are:  Medication management: Tuesday and Thursday 8:00 AM to 4:00 PM Procedure day: Monday and Wednesday 7:30 AM to 4:00 PM (Last update: 08/23/2019) ____________________________________________________________________________________________  ____________________________________________________________________________________________  CBD (cannabidiol) & Delta-8 (Delta-8 tetrahydrocannabinol) WARNING  Intro: Cannabidiol (CBD) and tetrahydrocannabinol (THC), are two natural compounds found in plants of the Cannabis genus. They can both be extracted from hemp or cannabis. Hemp and cannabis come from the Cannabis sativa plant. Both compounds interact with your body's endocannabinoid system, but they have very different effects. CBD does not produce the high sensation associated with cannabis. Delta-8  tetrahydrocannabinol, also known as delta-8 THC, is a psychoactive substance found in the Cannabis sativa plant, of which marijuana and hemp are two varieties. THC is responsible for the high associated with the illicit use of marijuana.  Applicable to: All individuals currently taking or considering taking CBD (cannabidiol) and,   more important, all patients taking opioid analgesic controlled substances (pain medication). (Example: oxycodone; oxymorphone; hydrocodone; hydromorphone; morphine; methadone; tramadol; tapentadol; fentanyl; buprenorphine; butorphanol; dextromethorphan; meperidine; codeine; etc.)  Legal status: CBD remains a Schedule I drug prohibited for any use. CBD is illegal with one exception. In the [place], CBD has a limited Food and Drug Administration (FDA) approval for the treatment of two specific types of epilepsy disorders. Only one CBD product has been approved by the FDA for this purpose: "Epidiolex". FDA is aware that some companies are marketing products containing cannabis and cannabis-derived compounds in ways that violate the Federal Food, Drug and Cosmetic Act (FD&C Act) and that may put the health and safety of consumers at risk. The FDA, a Federal agency, has not enforced the CBD status since 2018. UPDATE: (03/21/2021) The Drug Enforcement Agency (DEA) issued a letter stating that "delta" cannabinoids, including Delta-8-THCO and Delta-9-THCO, synthetically derived from hemp do not qualify as hemp and will be viewed as Schedule I drugs. (Schedule I drugs, substances, or chemicals are defined as drugs with no currently accepted medical use and a high potential for abuse. Some examples of Schedule I drugs are: heroin, lysergic acid diethylamide (LSD), marijuana (cannabis), 3,4-methylenedioxymethamphetamine (ecstasy), methaqualone, and peyote.) (https://www.dea.gov)  Legality: Some manufacturers ship CBD products nationally, which is illegal. Often such products are sold  online and are therefore available throughout the country. CBD is openly sold in head shops and health food stores in some states where such sales have not been explicitly legalized. Selling unapproved products with unsubstantiated therapeutic claims is not only a violation of the law, but also can put patients at risk, as these products have not been proven to be safe or effective. Federal illegality makes it difficult to conduct research on CBD.  Reference: "FDA Regulation of Cannabis and Cannabis-Derived Products, Including Cannabidiol (CBD)" - https://www.fda.gov/news-events/public-health-focus/fda-regulation-cannabis-and-cannabis-derived-products-including-cannabidiol-cbd  Warning: CBD is not FDA approved and has not undergo the same manufacturing controls as prescription drugs.  This means that the purity and safety of available CBD may be questionable. Most of the time, despite manufacturer's claims, it is contaminated with THC (delta-9-tetrahydrocannabinol - the chemical in marijuana responsible for the "HIGH").  When this is the case, the THC contaminant will trigger a positive urine drug screen (UDS) test for Marijuana (carboxy-THC). Because a positive UDS for any illicit substance is a violation of our medication agreement, your opioid analgesics (pain medicine) may be permanently discontinued. The FDA recently put out a warning about 5 things that everyone should be aware of regarding Delta-8 THC: Delta-8 THC products have not been evaluated or approved by the FDA for safe use and may be marketed in ways that put the public health at risk. The FDA has received adverse event reports involving delta-8 THC-containing products. Delta-8 THC has psychoactive and intoxicating effects. Delta-8 THC manufacturing often involve use of potentially harmful chemicals to create the concentrations of delta-8 THC claimed in the marketplace. The final delta-8 THC product may have potentially harmful by-products  (contaminants) due to the chemicals used in the process. Manufacturing of delta-8 THC products may occur in uncontrolled or unsanitary settings, which may lead to the presence of unsafe contaminants or other potentially harmful substances. Delta-8 THC products should be kept out of the reach of children and pets.  MORE ABOUT CBD  General Information: CBD was discovered in 1940 and it is a derivative of the cannabis sativa genus plants (Marijuana and Hemp). It is one of the 113 identified substances found in Marijuana.   It accounts for up to 40% of the plant's extract. As of 2018, preliminary clinical studies on CBD included research for the treatment of anxiety, movement disorders, and pain. CBD is available and consumed in multiple forms, including inhalation of smoke or vapor, as an aerosol spray, and by mouth. It may be supplied as an oil containing CBD, capsules, dried cannabis, or as a liquid solution. CBD is thought not to be as psychoactive as THC (delta-9-tetrahydrocannabinol - the chemical in marijuana responsible for the "HIGH"). Studies suggest that CBD may interact with different biological target receptors in the body, including cannabinoid and other neurotransmitter receptors. As of 2018 the mechanism of action for its biological effects has not been determined.  Side-effects  Adverse reactions: Dry mouth, diarrhea, decreased appetite, fatigue, drowsiness, malaise, weakness, sleep disturbances, and others.  Drug interactions: CBC may interact with other medications such as blood-thinners. Because CBD causes drowsiness on its own, it also increases the drowsiness caused by other medications, including antihistamines (such as Benadryl), benzodiazepines (Xanax, Ativan, Valium), antipsychotics, antidepressants and opioids, as well as alcohol and supplements such as kava, melatonin and St. John's Wort. Be cautious with the following combinations:   Brivaracetam (Briviact) Brivaracetam is changed  and broken down by the body. CBD might decrease how quickly the body breaks down brivaracetam. This might increase levels of brivaracetam in the body.  Caffeine Caffeine is changed and broken down by the body. CBD might decrease how quickly the body breaks down caffeine. This might increase levels of caffeine in the body.  Carbamazepine (Tegretol) Carbamazepine is changed and broken down by the body. CBD might decrease how quickly the body breaks down carbamazepine. This might increase levels of carbamazepine in the body and increase its side effects.  Citalopram (Celexa) Citalopram is changed and broken down by the body. CBD might decrease how quickly the body breaks down citalopram. This might increase levels of citalopram in the body and increase its side effects.  Clobazam (Onfi) Clobazam is changed and broken down by the liver. CBD might decrease how quickly the liver breaks down clobazam. This might increase the effects and side effects of clobazam.  Eslicarbazepine (Aptiom) Eslicarbazepine is changed and broken down by the body. CBD might decrease how quickly the body breaks down eslicarbazepine. This might increase levels of eslicarbazepine in the body by a small amount.  Everolimus (Zostress) Everolimus is changed and broken down by the body. CBD might decrease how quickly the body breaks down everolimus. This might increase levels of everolimus in the body.  Lithium Taking higher doses of CBD might increase levels of lithium. This can increase the risk of lithium toxicity.  Medications changed by the liver (Cytochrome P450 1A1 (CYP1A1) substrates) Some medications are changed and broken down by the liver. CBD might change how quickly the liver breaks down these medications. This could change the effects and side effects of these medications.  Medications changed by the liver (Cytochrome P450 1A2 (CYP1A2) substrates) Some medications are changed and broken down by the liver. CBD  might change how quickly the liver breaks down these medications. This could change the effects and side effects of these medications.  Medications changed by the liver (Cytochrome P450 1B1 (CYP1B1) substrates) Some medications are changed and broken down by the liver. CBD might change how quickly the liver breaks down these medications. This could change the effects and side effects of these medications.  Medications changed by the liver (Cytochrome P450 2A6 (CYP2A6) substrates) Some medications are changed and   broken down by the liver. CBD might change how quickly the liver breaks down these medications. This could change the effects and side effects of these medications.  Medications changed by the liver (Cytochrome P450 2B6 (CYP2B6) substrates) Some medications are changed and broken down by the liver. CBD might change how quickly the liver breaks down these medications. This could change the effects and side effects of these medications.  Medications changed by the liver (Cytochrome P450 2C19 (CYP2C19) substrates) Some medications are changed and broken down by the liver. CBD might change how quickly the liver breaks down these medications. This could change the effects and side effects of these medications.  Medications changed by the liver (Cytochrome P450 2C8 (CYP2C8) substrates) Some medications are changed and broken down by the liver. CBD might change how quickly the liver breaks down these medications. This could change the effects and side effects of these medications.  Medications changed by the liver (Cytochrome P450 2C9 (CYP2C9) substrates) Some medications are changed and broken down by the liver. CBD might change how quickly the liver breaks down these medications. This could change the effects and side effects of these medications.  Medications changed by the liver (Cytochrome P450 2D6 (CYP2D6) substrates) Some medications are changed and broken down by the liver. CBD might  change how quickly the liver breaks down these medications. This could change the effects and side effects of these medications.  Medications changed by the liver (Cytochrome P450 2E1 (CYP2E1) substrates) Some medications are changed and broken down by the liver. CBD might change how quickly the liver breaks down these medications. This could change the effects and side effects of these medications.  Medications changed by the liver (Cytochrome P450 3A4 (CYP3A4) substrates) Some medications are changed and broken down by the liver. CBD might change how quickly the liver breaks down these medications. This could change the effects and side effects of these medications.  Medications changed by the liver (Glucuronidated drugs) Some medications are changed and broken down by the liver. CBD might change how quickly the liver breaks down these medications. This could change the effects and side effects of these medications.  Medications that decrease the breakdown of other medications by the liver (Cytochrome P450 2C19 (CYP2C19) inhibitors) CBD is changed and broken down by the liver. Some drugs decrease how quickly the liver changes and breaks down CBD. This could change the effects and side effects of CBD.  Medications that decrease the breakdown of other medications in the liver (Cytochrome P450 3A4 (CYP3A4) inhibitors) CBD is changed and broken down by the liver. Some drugs decrease how quickly the liver changes and breaks down CBD. This could change the effects and side effects of CBD.  Medications that increase breakdown of other medications by the liver (Cytochrome P450 3A4 (CYP3A4) inducers) CBD is changed and broken down by the liver. Some drugs increase how quickly the liver changes and breaks down CBD. This could change the effects and side effects of CBD.  Medications that increase the breakdown of other medications by the liver (Cytochrome P450 2C19 (CYP2C19) inducers) CBD is changed and  broken down by the liver. Some drugs increase how quickly the liver changes and breaks down CBD. This could change the effects and side effects of CBD.  Methadone (Dolophine) Methadone is broken down by the liver. CBD might decrease how quickly the liver breaks down methadone. Taking cannabidiol along with methadone might increase the effects and side effects of methadone.  Rufinamide (Banzel) Rufinamide is   changed and broken down by the body. CBD might decrease how quickly the body breaks down rufinamide. This might increase levels of rufinamide in the body by a small amount.  Sedative medications (CNS depressants) CBD might cause sleepiness and slowed breathing. Some medications, called sedatives, can also cause sleepiness and slowed breathing. Taking CBD with sedative medications might cause breathing problems and/or too much sleepiness.  Sirolimus (Rapamune) Sirolimus is changed and broken down by the body. CBD might decrease how quickly the body breaks down sirolimus. This might increase levels of sirolimus in the body.  Stiripentol (Diacomit) Stiripentol is changed and broken down by the body. CBD might decrease how quickly the body breaks down stiripentol. This might increase levels of stiripentol in the body and increase its side effects.  Tacrolimus (Prograf) Tacrolimus is changed and broken down by the body. CBD might decrease how quickly the body breaks down tacrolimus. This might increase levels of tacrolimus in the body.  Tamoxifen (Soltamox) Tamoxifen is changed and broken down by the body. CBD might affect how quickly the body breaks down tamoxifen. This might affect levels of tamoxifen in the body.  Topiramate (Topamax) Topiramate is changed and broken down by the body. CBD might decrease how quickly the body breaks down topiramate. This might increase levels of topiramate in the body by a small amount.  Valproate Valproic acid can cause liver injury. Taking cannabidiol  with valproic acid might increase the chance of liver injury. CBD and/or valproic acid might need to be stopped, or the dose might need to be reduced.  Warfarin (Coumadin) CBD might increase levels of warfarin, which can increase the risk for bleeding. CBD and/or warfarin might need to be stopped, or the dose might need to be reduced.  Zonisamide Zonisamide is changed and broken down by the body. CBD might decrease how quickly the body breaks down zonisamide. This might increase levels of zonisamide in the body by a small amount. (Last update: 04/02/2021) ____________________________________________________________________________________________  ____________________________________________________________________________________________  Drug Holidays (Slow)  What is a "Drug Holiday"? Drug Holiday: is the name given to the period of time during which a patient stops taking a medication(s) for the purpose of eliminating tolerance to the drug.  Benefits Improved effectiveness of opioids. Decreased opioid dose needed to achieve benefits. Improved pain with lesser dose.  What is tolerance? Tolerance: is the progressive decreased in effectiveness of a drug due to its repetitive use. With repetitive use, the body gets use to the medication and as a consequence, it loses its effectiveness. This is a common problem seen with opioid pain medications. As a result, a larger dose of the drug is needed to achieve the same effect that used to be obtained with a smaller dose.  How long should a "Drug Holiday" last? You should stay off of the pain medicine for at least 14 consecutive days. (2 weeks)  Should I stop the medicine "cold turkey"? No. You should always coordinate with your Pain Specialist so that he/she can provide you with the correct medication dose to make the transition as smoothly as possible.  How do I stop the medicine? Slowly. You will be instructed to decrease the daily amount of  pills that you take by one (1) pill every seven (7) days. This is called a "slow downward taper" of your dose. For example: if you normally take four (4) pills per day, you will be asked to drop this dose to three (3) pills per day for seven (7) days, then to   two (2) pills per day for seven (7) days, then to one (1) per day for seven (7) days, and at the end of those last seven (7) days, this is when the "Drug Holiday" would start.   Will I have withdrawals? By doing a "slow downward taper" like this one, it is unlikely that you will experience any significant withdrawal symptoms. Typically, what triggers withdrawals is the sudden stop of a high dose opioid therapy. Withdrawals can usually be avoided by slowly decreasing the dose over a prolonged period of time. If you do not follow these instructions and decide to stop your medication abruptly, withdrawals may be possible.  What are withdrawals? Withdrawals: refers to the wide range of symptoms that occur after stopping or dramatically reducing opiate drugs after heavy and prolonged use. Withdrawal symptoms do not occur to patients that use low dose opioids, or those who take the medication sporadically. Contrary to benzodiazepine (example: Valium, Xanax, etc.) or alcohol withdrawals ("Delirium Tremens"), opioid withdrawals are not lethal. Withdrawals are the physical manifestation of the body getting rid of the excess receptors.  Expected Symptoms Early symptoms of withdrawal may include: Agitation Anxiety Muscle aches Increased tearing Insomnia Runny nose Sweating Yawning  Late symptoms of withdrawal may include: Abdominal cramping Diarrhea Dilated pupils Goose bumps Nausea Vomiting  Will I experience withdrawals? Due to the slow nature of the taper, it is very unlikely that you will experience any.  What is a slow taper? Taper: refers to the gradual decrease in dose.  (Last update:  08/23/2019) ____________________________________________________________________________________________   ______________________________________________________________________  Preparing for Procedure with Sedation  NOTICE: Due to recent regulatory changes, starting on September 02, 2020, procedures requiring intravenous (IV) sedation will no longer be performed at the [facility].  These types of procedures are required to be performed at [facility] ambulatory surgery facility.  We are very sorry for the inconvenience.  Procedure appointments are limited to planned procedures: No Prescription Refills. No disability issues will be discussed. No medication changes will be discussed.  Instructions: Oral Intake: Do not eat or drink anything for at least 8 hours prior to your procedure. (Exception: Blood Pressure Medication. See below.) Transportation: A driver is required. You may not drive yourself after the procedure. Blood Pressure Medicine: Do not forget to take your blood pressure medicine with a sip of water the morning of the procedure. If your Diastolic (lower reading) is above 100 mmHg, elective cases will be cancelled/rescheduled. Blood thinners: These will need to be stopped for procedures. Notify our staff if you are taking any blood thinners. Depending on which one you take, there will be specific instructions on how and when to stop it. Diabetics on insulin: Notify the staff so that you can be scheduled 1st case in the morning. If your diabetes requires high dose insulin, take only  of your normal insulin dose the morning of the procedure and notify the staff that you have done so. Preventing infections: Shower with an antibacterial soap the morning of your procedure. Build-up your immune system: Take 1000 mg of Vitamin C with every meal (3 times a day) the day prior to your procedure. Antibiotics: Inform the staff if you have a condition or reason that requires you to take  antibiotics before dental procedures. Pregnancy: If you are pregnant, call and cancel the procedure. Sickness: If you have a cold, fever, or any active infections, call and cancel the procedure. Arrival: You must be in the facility at least 30 minutes prior to   your scheduled procedure. Children: Do not bring children with you. Dress appropriately: There is always the possibility that your clothing may get soiled. Valuables: Do not bring any jewelry or valuables.  Reasons to call and reschedule or cancel your procedure: (Following these recommendations will minimize the risk of a serious complication.) Surgeries: Avoid having procedures within 2 weeks of any surgery. (Avoid for 2 weeks before or after any surgery). Flu Shots: Avoid having procedures within 2 weeks of a flu shots. (Avoid for 2 weeks before or after immunizations). Barium: Avoid having a procedure within 7-10 days after having had a radiological study involving the use of radiological contrast. (Myelograms, Barium swallow or enema study). Heart attacks: Avoid any elective procedures or surgeries for the initial 6 months after a "Myocardial Infarction" (Heart Attack). Blood thinners: It is imperative that you stop these medications before procedures. Let us know if you if you take any blood thinner.  Infection: Avoid procedures during or within two weeks of an infection (including chest colds or gastrointestinal problems). Symptoms associated with infections include: Localized redness, fever, chills, night sweats or profuse sweating, burning sensation when voiding, cough, congestion, stuffiness, runny nose, sore throat, diarrhea, nausea, vomiting, cold or Flu symptoms, recent or current infections. It is specially important if the infection is over the area that we intend to treat. Heart and lung problems: Symptoms that may suggest an active cardiopulmonary problem include: cough, chest pain, breathing difficulties or shortness of breath,  dizziness, ankle swelling, uncontrolled high or unusually low blood pressure, and/or palpitations. If you are experiencing any of these symptoms, cancel your procedure and contact your primary care physician for an evaluation.  Remember:  Regular Business hours are:  Monday to Thursday 8:00 AM to 4:00 PM  Provider's Schedule: Ramata Strothman, MD:  Procedure days: Tuesday and Thursday 7:30 AM to 4:00 PM  Bilal Lateef, MD:  Procedure days: Monday and Wednesday 7:30 AM to 4:00 PM ______________________________________________________________________  ____________________________________________________________________________________________  General Risks and Possible Complications  Patient Responsibilities: It is important that you read this as it is part of your informed consent. It is our duty to inform you of the risks and possible complications associated with treatments offered to you. It is your responsibility as a patient to read this and to ask questions about anything that is not clear or that you believe was not covered in this document.  Patient's Rights: You have the right to refuse treatment. You also have the right to change your mind, even after initially having agreed to have the treatment done. However, under this last option, if you wait until the last second to change your mind, you may be charged for the materials used up to that point.  Introduction: Medicine is not an exact science. Everything in Medicine, including the lack of treatment(s), carries the potential for danger, harm, or loss (which is by definition: Risk). In Medicine, a complication is a secondary problem, condition, or disease that can aggravate an already existing one. All treatments carry the risk of possible complications. The fact that a side effects or complications occurs, does not imply that the treatment was conducted incorrectly. It must be clearly understood that these can happen even when  everything is done following the highest safety standards.  No treatment: You can choose not to proceed with the proposed treatment alternative. The "PRO(s)" would include: avoiding the risk of complications associated with the therapy. The "CON(s)" would include: not getting any of the treatment benefits. These benefits fall under   one of three categories: diagnostic; therapeutic; and/or palliative. Diagnostic benefits include: getting information which can ultimately lead to improvement of the disease or symptom(s). Therapeutic benefits are those associated with the successful treatment of the disease. Finally, palliative benefits are those related to the decrease of the primary symptoms, without necessarily curing the condition (example: decreasing the pain from a flare-up of a chronic condition, such as incurable terminal cancer).  General Risks and Complications: These are associated to most interventional treatments. They can occur alone, or in combination. They fall under one of the following six (6) categories: no benefit or worsening of symptoms; bleeding; infection; nerve damage; allergic reactions; and/or death. No benefits or worsening of symptoms: In Medicine there are no guarantees, only probabilities. No healthcare provider can ever guarantee that a medical treatment will work, they can only state the probability that it may. Furthermore, there is always the possibility that the condition may worsen, either directly, or indirectly, as a consequence of the treatment. Bleeding: This is more common if the patient is taking a blood thinner, either prescription or over the counter (example: Goody Powders, Fish oil, Aspirin, Garlic, etc.), or if suffering a condition associated with impaired coagulation (example: Hemophilia, cirrhosis of the liver, low platelet counts, etc.). However, even if you do not have one on these, it can still happen. If you have any of these conditions, or take one of these  drugs, make sure to notify your treating physician. Infection: This is more common in patients with a compromised immune system, either due to disease (example: diabetes, cancer, human immunodeficiency virus [HIV], etc.), or due to medications or treatments (example: therapies used to treat cancer and rheumatological diseases). However, even if you do not have one on these, it can still happen. If you have any of these conditions, or take one of these drugs, make sure to notify your treating physician. Nerve Damage: This is more common when the treatment is an invasive one, but it can also happen with the use of medications, such as those used in the treatment of cancer. The damage can occur to small secondary nerves, or to large primary ones, such as those in the spinal cord and brain. This damage may be temporary or permanent and it may lead to impairments that can range from temporary numbness to permanent paralysis and/or brain death. Allergic Reactions: Any time a substance or material comes in contact with our body, there is the possibility of an allergic reaction. These can range from a mild skin rash (contact dermatitis) to a severe systemic reaction (anaphylactic reaction), which can result in death. Death: In general, any medical intervention can result in death, most of the time due to an unforeseen complication. ____________________________________________________________________________________________  

## 2021-10-13 ENCOUNTER — Encounter: Payer: Self-pay | Admitting: Pain Medicine

## 2021-10-13 ENCOUNTER — Ambulatory Visit: Payer: 59 | Attending: Pain Medicine | Admitting: Pain Medicine

## 2021-10-13 VITALS — BP 148/97 | HR 102 | Temp 97.3°F | Resp 18 | Ht 66.0 in | Wt 160.0 lb

## 2021-10-13 DIAGNOSIS — M5030 Other cervical disc degeneration, unspecified cervical region: Secondary | ICD-10-CM | POA: Diagnosis present

## 2021-10-13 DIAGNOSIS — G894 Chronic pain syndrome: Secondary | ICD-10-CM | POA: Diagnosis present

## 2021-10-13 DIAGNOSIS — Z79891 Long term (current) use of opiate analgesic: Secondary | ICD-10-CM | POA: Diagnosis present

## 2021-10-13 DIAGNOSIS — M25511 Pain in right shoulder: Secondary | ICD-10-CM | POA: Diagnosis present

## 2021-10-13 DIAGNOSIS — M545 Low back pain, unspecified: Secondary | ICD-10-CM | POA: Insufficient documentation

## 2021-10-13 DIAGNOSIS — M79605 Pain in left leg: Secondary | ICD-10-CM | POA: Insufficient documentation

## 2021-10-13 DIAGNOSIS — M25512 Pain in left shoulder: Secondary | ICD-10-CM | POA: Insufficient documentation

## 2021-10-13 DIAGNOSIS — M47812 Spondylosis without myelopathy or radiculopathy, cervical region: Secondary | ICD-10-CM | POA: Insufficient documentation

## 2021-10-13 DIAGNOSIS — G8929 Other chronic pain: Secondary | ICD-10-CM | POA: Insufficient documentation

## 2021-10-13 DIAGNOSIS — M25562 Pain in left knee: Secondary | ICD-10-CM | POA: Insufficient documentation

## 2021-10-13 DIAGNOSIS — Z79899 Other long term (current) drug therapy: Secondary | ICD-10-CM | POA: Insufficient documentation

## 2021-10-13 DIAGNOSIS — M5020 Other cervical disc displacement, unspecified cervical region: Secondary | ICD-10-CM | POA: Diagnosis present

## 2021-10-13 DIAGNOSIS — M3500 Sicca syndrome, unspecified: Secondary | ICD-10-CM | POA: Insufficient documentation

## 2021-10-13 DIAGNOSIS — M5412 Radiculopathy, cervical region: Secondary | ICD-10-CM | POA: Diagnosis present

## 2021-10-13 DIAGNOSIS — M25561 Pain in right knee: Secondary | ICD-10-CM | POA: Insufficient documentation

## 2021-10-13 DIAGNOSIS — M542 Cervicalgia: Secondary | ICD-10-CM | POA: Diagnosis present

## 2021-10-13 DIAGNOSIS — M79604 Pain in right leg: Secondary | ICD-10-CM | POA: Diagnosis present

## 2021-10-13 DIAGNOSIS — M961 Postlaminectomy syndrome, not elsewhere classified: Secondary | ICD-10-CM | POA: Insufficient documentation

## 2021-10-13 DIAGNOSIS — M7918 Myalgia, other site: Secondary | ICD-10-CM | POA: Diagnosis present

## 2021-10-13 MED ORDER — OXYCODONE HCL 10 MG PO TABS: 10.0000 mg | ORAL_TABLET | Freq: Four times a day (QID) | ORAL | 0 refills | Status: DC | PRN

## 2021-10-13 NOTE — Progress Notes (Signed)
Nursing Pain Medication Assessment:  Safety precautions to be maintained throughout the outpatient stay will include: orient to surroundings, keep bed in low position, maintain call bell within reach at all times, provide assistance with transfer out of bed and ambulation.  Medication Inspection Compliance: Pill count conducted under aseptic conditions, in front of the patient. Neither the pills nor the bottle was removed from the patient's sight at any time. Once count was completed pills were immediately returned to the patient in their original bottle.  Medication: Oxycodone IR Pill/Patch Count:  31 of 120 pills remain Pill/Patch Appearance: Markings consistent with prescribed medication Bottle Appearance: Standard pharmacy container. Clearly labeled. Filled Date: 08 / 19 / 2023 Last Medication intake:  Today

## 2021-10-16 ENCOUNTER — Ambulatory Visit
Admission: RE | Admit: 2021-10-16 | Discharge: 2021-10-16 | Disposition: A | Discharge status: Home / Self Care | Payer: 59 | Source: Ambulatory Visit | Attending: Pain Medicine | Admitting: Pain Medicine

## 2021-10-16 DIAGNOSIS — M5020 Other cervical disc displacement, unspecified cervical region: Secondary | ICD-10-CM | POA: Diagnosis present

## 2021-10-16 DIAGNOSIS — M5412 Radiculopathy, cervical region: Secondary | ICD-10-CM | POA: Insufficient documentation

## 2021-10-16 DIAGNOSIS — M542 Cervicalgia: Secondary | ICD-10-CM

## 2021-10-16 DIAGNOSIS — G8929 Other chronic pain: Secondary | ICD-10-CM

## 2021-10-16 DIAGNOSIS — M47812 Spondylosis without myelopathy or radiculopathy, cervical region: Secondary | ICD-10-CM | POA: Diagnosis present

## 2021-10-16 DIAGNOSIS — M5030 Other cervical disc degeneration, unspecified cervical region: Secondary | ICD-10-CM | POA: Diagnosis present

## 2021-10-20 ENCOUNTER — Encounter: Payer: Self-pay | Admitting: Pain Medicine

## 2021-10-23 ENCOUNTER — Ambulatory Visit: Payer: 59 | Attending: Pain Medicine | Admitting: Pain Medicine

## 2021-10-23 ENCOUNTER — Encounter: Payer: Self-pay | Admitting: Pain Medicine

## 2021-10-23 ENCOUNTER — Ambulatory Visit
Admission: RE | Admit: 2021-10-23 | Discharge: 2021-10-23 | Disposition: A | Discharge status: Home / Self Care | Payer: 59 | Source: Ambulatory Visit | Attending: Pain Medicine | Admitting: Pain Medicine

## 2021-10-23 VITALS — BP 138/93 | HR 99 | Temp 97.4°F | Resp 16 | Ht 66.0 in | Wt 160.0 lb

## 2021-10-23 DIAGNOSIS — G4486 Cervicogenic headache: Secondary | ICD-10-CM | POA: Insufficient documentation

## 2021-10-23 DIAGNOSIS — M5020 Other cervical disc displacement, unspecified cervical region: Secondary | ICD-10-CM

## 2021-10-23 DIAGNOSIS — M4722 Other spondylosis with radiculopathy, cervical region: Secondary | ICD-10-CM | POA: Diagnosis not present

## 2021-10-23 DIAGNOSIS — G8929 Other chronic pain: Secondary | ICD-10-CM | POA: Diagnosis not present

## 2021-10-23 DIAGNOSIS — M47812 Spondylosis without myelopathy or radiculopathy, cervical region: Secondary | ICD-10-CM

## 2021-10-23 DIAGNOSIS — M5010 Cervical disc disorder with radiculopathy, unspecified cervical region: Secondary | ICD-10-CM | POA: Insufficient documentation

## 2021-10-23 DIAGNOSIS — M542 Cervicalgia: Secondary | ICD-10-CM

## 2021-10-23 DIAGNOSIS — M5030 Other cervical disc degeneration, unspecified cervical region: Secondary | ICD-10-CM

## 2021-10-23 MED ORDER — LACTATED RINGERS IV SOLN: Freq: Once | INTRAVENOUS | Status: DC

## 2021-10-23 MED ORDER — LIDOCAINE HCL 2 % IJ SOLN
20.0000 mL | Freq: Once | INTRAMUSCULAR | Status: AC
  Administered 2021-10-23: 400 mg

## 2021-10-23 MED ORDER — DEXAMETHASONE SODIUM PHOSPHATE 10 MG/ML IJ SOLN
INTRAMUSCULAR | Status: AC
  Filled 2021-10-23: qty 1

## 2021-10-23 MED ORDER — DEXAMETHASONE SODIUM PHOSPHATE 10 MG/ML IJ SOLN
10.0000 mg | Freq: Once | INTRAMUSCULAR | Status: AC
  Administered 2021-10-23: 10 mg

## 2021-10-23 MED ORDER — ROPIVACAINE HCL 2 MG/ML IJ SOLN
INTRAMUSCULAR | Status: AC
  Filled 2021-10-23: qty 20

## 2021-10-23 MED ORDER — SODIUM CHLORIDE 0.9% FLUSH
1.0000 mL | Freq: Once | INTRAVENOUS | Status: AC
  Administered 2021-10-23: 1 mL

## 2021-10-23 MED ORDER — SODIUM CHLORIDE (PF) 0.9 % IJ SOLN
INTRAMUSCULAR | Status: AC
  Filled 2021-10-23: qty 10

## 2021-10-23 MED ORDER — IOHEXOL 180 MG/ML  SOLN
INTRAMUSCULAR | Status: AC
  Filled 2021-10-23: qty 20

## 2021-10-23 MED ORDER — PENTAFLUOROPROP-TETRAFLUOROETH EX AERO
INHALATION_SPRAY | Freq: Once | CUTANEOUS | Status: AC
  Administered 2021-10-23: 30 via TOPICAL
  Filled 2021-10-23: qty 116

## 2021-10-23 MED ORDER — LIDOCAINE HCL 2 % IJ SOLN
INTRAMUSCULAR | Status: AC
  Filled 2021-10-23: qty 20

## 2021-10-23 MED ORDER — IOHEXOL 180 MG/ML  SOLN
10.0000 mL | Freq: Once | INTRAMUSCULAR | Status: AC
  Administered 2021-10-23: 10 mL via EPIDURAL

## 2021-10-23 MED ORDER — ROPIVACAINE HCL 2 MG/ML IJ SOLN
1.0000 mL | Freq: Once | INTRAMUSCULAR | Status: AC
  Administered 2021-10-23: 1 mL via EPIDURAL

## 2021-10-23 MED ORDER — MIDAZOLAM HCL 5 MG/5ML IJ SOLN: 0.5000 mg | Freq: Once | INTRAMUSCULAR | Status: DC

## 2021-10-23 NOTE — Progress Notes (Signed)
PROVIDER NOTE: Interpretation of information contained herein should be left to medically-trained personnel. Specific patient instructions are provided elsewhere under "Patient Instructions" section of medical record. This document was created in part using STT-dictation technology, any transcriptional errors that may result from this process are unintentional.  Patient: Holly Spears Type: Established DOB: 07/21/1970 MRN: [id number] PCP: Lonzo Cloud, PA-C  Service: Procedure DOS: 10/23/2021 Setting: Ambulatory Location: Ambulatory outpatient facility Delivery: Face-to-face Provider: Gaspar Cola, MD Specialty: Interventional Pain Management Specialty designation: 09 Location: Outpatient facility Ref. Prov.: Milinda Pointer, MD   Procedure [facility] Interventional Pain Management )   Type: Cervical Epidural Steroid injection (ESI) (Interlaminar) #1  Laterality: Right  Level: C7-T1 Imaging: Fluoroscopy-assisted DOS: 10/23/2021  Performed by: Milinda Pointer, MD Anesthesia: Local anesthesia (1-2% Lidocaine) Anxiolysis: None                 Sedation: No Sedation                         Purpose: Diagnostic/Therapeutic Indications: Cervicalgia, cervical radicular pain, degenerative disc disease, severe enough to impact quality of life or function. 1. Chronic neck pain (4th area of Pain)   2. DDD (degenerative disc disease), cervical   3. Cervicogenic headache   4. Bulge of cervical disc without myelopathy (C5-6)   5. Cervical disc herniation (C6-7) (Left)   6. Cervical spondylosis (C5-6 and C6-7)   7. Chronic cervical radicular pain (Bilateral) (R>L)    NAS-11 score:   Pre-procedure: 5 /10   Post-procedure: 4 /10      Pre-Procedure Preparation  Monitoring: As per clinic protocol. Respiration, ETCO2, SpO2, BP, heart rate and rhythm monitor placed and checked for adequate function  Risk Assessment: Vitals:  [id number] body mass index is 25.82 kg/m as calculated from  the following:   Height as of this encounter: 5\' 6"  (1.676 m).   Weight as of this encounter: 160 lb (72.6 kg)., Rate:83 , BP:(!) 154/92, Resp:16, Temp:(!) 97.4 F (36.3 C), SpO2:100 %  Allergies: She is allergic to bee venom, penicillins, shellfish allergy, sulfa antibiotics, erythromycin, zaleplon, codeine, and trazodone.  Precautions: None required  Blood-thinner(s): None at this time  Coagulopathies: Reviewed. None identified.   Active Infection(s): Reviewed. None identified. Ms. Rappleye is afebrile   Location setting: Procedure suite Position: Prone, on modified reverse trendelenburg to facilitate breathing, with head in head-cradle. Pillows positioned under chest (below chin-level) with cervical spine flexed. Safety Precautions: Patient was assessed for positional comfort and pressure points before starting the procedure. Prepping solution: DuraPrep (Iodine Povacrylex [0.7% available iodine] and Isopropyl Alcohol, 74% w/w) Prep Area: Entire  cervicothoracic region Approach: percutaneous, paramedial Intended target: Posterior cervical epidural space Materials: Tray: Epidural Needle(s): Epidural (Tuohy) Qty: 1 Length: (54mm) 3.5-inch Gauge: 17G   Meds ordered this encounter  Medications   iohexol (OMNIPAQUE) 180 MG/ML injection 10 mL    Must be Myelogram-compatible. If not available, you may substitute with a water-soluble, non-ionic, hypoallergenic, myelogram-compatible radiological contrast medium.   lidocaine (XYLOCAINE) 2 % (with pres) injection 400 mg   pentafluoroprop-tetrafluoroeth (GEBAUERS) aerosol   DISCONTD: lactated ringers infusion   DISCONTD: midazolam (VERSED) 5 MG/5ML injection 0.5-2 mg    Make sure Flumazenil is available in the pyxis when using this medication. If oversedation occurs, administer 0.2 mg IV over 15 sec. If after 45 sec no response, administer 0.2 mg again over 1 min; may repeat at 1 min intervals; not to exceed 4 doses (1 mg)  sodium chloride  flush (NS) 0.9 % injection 1 mL   ropivacaine (PF) 2 mg/mL (0.2%) (NAROPIN) injection 1 mL   dexamethasone (DECADRON) injection 10 mg    Orders Placed This Encounter  Procedures   Cervical Epidural Injection    Indication(s): Radiculitis and cervicalgia associated with cervical degenerative disc disease. Position: Prone Imaging guidance: Fluoroscopy required. Contrast required unless contraindicated by allergy or severe CKD. Equipment & Materials: Epidural tray & needle.    Scheduling Instructions:     Procedure: Cervical Epidural Steroid Injection/Block     Planned Level(s): C7-T1     Laterality: Right-sided     Anxiolysis: Patient's choice.     Timeframe: Today    Order Specific Question:   Where will this procedure be performed?    Answer:   [facility]    Comments:   by Dr. Conception Oms [facility] C-ARM 1-60 MIN NO REPORT    Intraoperative interpretation by procedural physician at [facility].    Standing Status:   Standing    Number of Occurrences:   1    Order Specific Question:   Reason for exam:    Answer:   Assistance in needle guidance and placement for procedures requiring needle placement in or near specific anatomical locations not easily accessible without such assistance.   Informed Consent Details: Physician/Practitioner Attestation; Transcribe to consent form and obtain patient signature    Nursing instructions: Transcribe to consent form and obtain patient signature. Always confirm laterality of pain with Ms. Traw, before procedure.    Order Specific Question:   Physician/Practitioner attestation of informed consent for procedure/surgical case    Answer:   I, the physician/practitioner, attest that I have discussed with the patient the benefits, risks, side effects, alternatives, likelihood of achieving goals and potential problems during recovery for the procedure that I have provided informed consent.    Order Specific Question:   Procedure     Answer:   Cervical Epidural Steroid Injection (CESI) under fluoroscopic guidance    Order Specific Question:   Physician/Practitioner performing the procedure    Answer:   Alleen Kehm A. Laban Emperor MD    Order Specific Question:   Indication/Reason    Answer:   Indications: Cervicalgia (neck pain), cervical radicular pain, radiculitis (arm/shoulder pain, numbness, and/or weakness), degenerative disc disease, severe enough to greatly impact quality of life or function.   Provide equipment / supplies at bedside    "Epidural Tray" (Disposable  single use) Catheter: NOT required    Standing Status:   Standing    Number of Occurrences:   1    Order Specific Question:   Specify    Answer:   Epidural Tray     Time-out: 1024 I initiated and conducted the "Time-out" before starting the procedure, as per protocol. The patient was asked to participate by confirming the accuracy of the "Time Out" information. Verification of the correct person, site, and procedure were performed and confirmed by me, the nursing staff, and the patient. "Time-out" conducted as per Joint Commission's Universal Protocol (UP.01.01.01). Procedure checklist: Completed   H&P (Pre-op  Assessment)  Ms. Statzer is a 51 y.o. (year old), female patient, seen today for interventional treatment. She  has a past surgical history that includes Cholecystectomy and Abdominal hysterectomy. Ms. Hosty has a current medication list which includes the following prescription(s): biotin, calcium carbonate-vitamin d, vitamin d3, citalopram, epinephrine, famotidine, ibuprofen, levothyroxine, magnesium, multi-vitamins, oxycodone hcl, [START ON 11/19/2021] oxycodone hcl, [START ON 12/19/2021]  oxycodone hcl, potassium chloride sa, prevident 5000 booster plus, zinc, bupropion, cyclobenzaprine, hydroxychloroquine, and naloxone. Her primarily concern today is the Neck Pain  She is allergic to bee venom, penicillins, shellfish allergy, sulfa antibiotics,  erythromycin, zaleplon, codeine, and trazodone.   Last encounter: My last encounter with her was on 10/13/2021. Pertinent problems: Ms. 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; Lumbar spondylosis (L3-4, L4-5, and L5-S1); Lumbar bulging disc (L3-4, L4-5, and L5-S1); Chronic lumbar radicular pain (Bilateral) (R>L); Chronic knee pain (3ry area of Pain) (Bilateral) (R>L); Chronic upper back pain (Left); Thoracic disc herniation (Large, Left paracentral T10-11 disc herniation); Thoracic spinal stenosis (Severe, Left, T10-11 Lateral Recess Stenosis); Thoracic foraminal stenosis (Severe Left T10-11); History of bilateral carpal tunnel release (Bilateral); Chronic hand pain (Bilateral) (R>L); Muscle weakness; Chronic lower extremity pain (2ry area of Pain) (Bilateral); Neck pain; Spasm of back muscles; Myalgia and myositis; Fibromyalgia; Sjogren's syndrome (HCC); DDD (degenerative disc disease), cervical; and Right cervical radiculopathy (C8) on their pertinent problem list. Pain Assessment: Severity of Chronic pain is reported as a 5 /10. Location: Neck Right/pain radiaities down her right arm to her finger. Onset: More than a month ago. Quality: Aching, Burning, Throbbing, Constant. Timing: Constant. Modifying factor(s): heat and meds. Vitals:  height is  (1.676 m) and weight is 160 lb (72.6 kg). Her temperature is 97.4 F (36.3 C) (abnormal). Her blood pressure is 138/93  (abnormal) and her pulse is 99. Her respiration is 16 and oxygen saturation is 100%.   Reason for encounter: Interventional pain management therapy due pain of at least four (4) weeks in duration, with to failure to respond to and/or inability to tolerate more conservative care.   Site Confirmation: Ms. Winnie was asked to confirm the procedure and laterality before marking the site.  Consent: Before the procedure and under the influence of no sedative(s), amnesic(s), or anxiolytics, the patient was informed of the treatment options, risks and possible complications. To fulfill our ethical and legal obligations, as recommended by the American Medical Association's Code of Ethics, I have informed the patient of my clinical impression; the nature and purpose of the treatment or procedure; the risks, benefits, and possible complications of the intervention; the alternatives, including doing nothing; the risk(s) and benefit(s) of the alternative treatment(s) or procedure(s); and the risk(s) and benefit(s) of doing nothing. The patient was provided information about the general risks and possible complications associated with the procedure. These may include, but are not limited to: failure to achieve desired goals, infection, bleeding, organ or nerve damage, allergic reactions, paralysis, and death. In addition, the patient was informed of those risks and complications associated to Spine-related procedures, such as failure to decrease pain; infection (i.e.: Meningitis, epidural or intraspinal abscess); bleeding (i.e.: epidural hematoma, subarachnoid hemorrhage, or any other type of intraspinal or peri-dural bleeding); organ or nerve damage (i.e.: Any type of peripheral nerve, nerve root, or spinal cord injury) with subsequent damage to sensory, motor, and/or autonomic systems, resulting in permanent pain, numbness, and/or weakness of one or several areas of the body; allergic reactions; (i.e.: anaphylactic  reaction); and/or death. Furthermore, the patient was informed of those risks and complications associated with the medications. These include, but are not limited to: allergic reactions (i.e.: anaphylactic or anaphylactoid reaction(s)); adrenal axis suppression; blood sugar elevation that in diabetics may result in ketoacidosis or comma; water retention that in patients with history of congestive heart failure may result in shortness of breath, pulmonary edema, and decompensation with resultant heart failure; weight gain; swelling or edema; medication-induced neural toxicity; particulate matter embolism and blood vessel occlusion with resultant organ, and/or nervous system infarction; and/or aseptic necrosis of one or more joints. Finally, the patient was informed that Medicine is not an exact science; therefore, there is also the possibility of unforeseen or unpredictable risks and/or possible complications that may result in a catastrophic outcome. The patient indicated having understood very clearly. We have given the patient no guarantees and we have made no promises. Enough time was given to the patient to ask questions, all of which were answered to the patient's satisfaction. Ms. Sosnowski has indicated that she wanted to continue with the procedure. Attestation: I, the ordering provider, attest that I have discussed with the patient the benefits, risks, side-effects, alternatives, likelihood of achieving goals, and potential problems during recovery for the procedure that I have provided informed consent.  Date  Time: 10/23/2021  9:31 AM   Prophylactic antibiotics  Anti-infectives (From admission, onward)    None      Indication(s): None identified   Description of procedure   Start Time: 1024 hrs  Local Anesthesia: Once the patient was positioned, prepped, and time-out was completed. The target area was identified located. The skin was marked with an approved surgical skin marker. Once  marked, the skin (epidermis, dermis, and hypodermis), and deeper tissues (fat, connective tissue and muscle) were infiltrated with a small amount of a short-acting local anesthetic, loaded on a 10cc syringe with a 25G, 1.5-in  Needle. An appropriate amount of time was allowed for local anesthetics to take effect before proceeding to the next step. Local Anesthetic: Lidocaine 1-2% The unused portion of the local anesthetic was discarded in the proper designated containers. Safety Precautions: Aspiration looking for blood return was conducted prior to all injections. At no point did I inject any substances, as a needle was being advanced. Before injecting, the patient was told to immediately notify me if she was experiencing any new onset of "ringing in the ears, or metallic taste in the mouth". No attempts were made at seeking any paresthesias. Safe injection practices and needle disposal techniques used. Medications properly checked for expiration dates. SDV (single dose vial) medications used. After the completion of the procedure, all disposable equipment used was discarded in the proper designated medical waste containers.  Technical description: Protocol guidelines were followed. Using fluoroscopic guidance, the epidural needle was introduced through the skin, ipsilateral to the reported pain, and advanced to the target area. Posterior laminar os was contacted and the needle walked caudad, until the lamina was cleared. The ligamentum flavum was engaged and the epidural space identified using "loss-of-resistance technique" with 2-3 ml of PF-NaCl (0.9% NSS), in a 5cc dedicated LOR syringe. See "Imaging guidance" below for use of contrast details.  Injection: Once satisfactory needle placement was confirmed, I proceeded to inject the desired solution in slow, incremental fashion, intermittently assessing for discomfort or any signs of abnormal or undesired spread of substance. Once completed, the needle was  removed and disposed of, as per hospital protocols.   Vitals:   10/23/21  1610 10/23/21 1020 10/23/21 1025 10/23/21 1035  BP: (!) 154/92 (!) 139/95 (!) 134/100 (!) 138/93  Pulse: 83 95 (!) 102 99  Resp:  Temp: (!) 97.4 F (36.3 C)     SpO2: 100% 100% 100% 100%  Weight: 160 lb (72.6 kg)     Height:  (1.676 m)       End Time: 1029 hrs  Once the entire procedure was completed, the treated area was cleaned, making sure to leave some of the prepping solution back to take advantage of its long term bactericidal properties.   Imaging guidance  Type of Imaging Technique: Fluoroscopy Guidance (Spinal) Indication(s): Assistance in needle guidance and placement for procedures requiring needle placement in or near specific anatomical locations not easily accessible without such assistance. Exposure Time: Please see nurses notes for exact fluoroscopy time. Contrast: Before injecting any contrast, we confirmed that the patient did not have an allergy to iodine, shellfish, or radiological contrast. Once satisfactory needle placement was completed, radiological contrast was injected under continuous fluoroscopic guidance. Injection of contrast accomplished without complications. See chart for type and volume of contrast used. Fluoroscopic Guidance: I was personally present in the fluoroscopy suite, where the patient was placed in position for the procedure, over the fluoroscopy-compatible table. Fluoroscopy was manipulated, using "Tunnel Vision Technique", to obtain the best possible view of the target area, on the affected side. Parallax error was corrected before commencing the procedure. A "direction-depth-direction" technique was used to introduce the needle under continuous pulsed fluoroscopic guidance. Once the target was reached, antero-posterior, oblique, and lateral fluoroscopic projection views were taken to confirm needle placement in all planes. Electronic images uploaded into  EMR.  Interpretation: Successful epidural injection. Intraoperative imaging interpretation by performing Physician.    Post-op assessment  Post-procedure Vital Signs:  Pulse/HCG Rate: 99  Temp: (!) 97.4 F (36.3 C) Resp: 16 BP: (!) 138/93 SpO2: 100 %  EBL: None  Complications: No immediate post-treatment complications observed by team, or reported by patient.  Note: The patient tolerated the entire procedure well. A repeat set of vitals were taken after the procedure and the patient was kept under observation following institutional policy, for this type of procedure. Post-procedural neurological assessment was performed, showing return to baseline, prior to discharge. The patient was provided with post-procedure discharge instructions, including a section on how to identify potential problems. Should any problems arise concerning this procedure, the patient was given instructions to immediately contact us, at any time, without hesitation. In any case, we plan to contact the patient by telephone for a follow-up status report regarding this interventional procedure.  Comments:  No additional relevant information.   Plan of care  Chronic Opioid Analgesic:  Oxycodone IR 10 mg, 1 tab PO q 6 hrs (40 mg/day of oxycodone) MME/day: 60 mg/day.   Medications administered: We administered iohexol, lidocaine, pentafluoroprop-tetrafluoroeth, sodium chloride flush, ropivacaine (PF) 2 mg/mL (0.2%), and dexamethasone.  Follow-up plan:   Return in about 2 weeks (around 11/06/2021) for Proc-day (T,Th), (F2F), (PPE).      Interventional Therapies  Risk  Complexity Considerations:   Estimated body mass index is 27.44 kg/m as calculated from the following:   Height as of this encounter:  (1.676 m).   Weight as of this encounter: 170 lb (77.1 kg). WNL   Planned  Pending:   The patient had a bad experience with another physician receiving blocks.   Under consideration:   Diagnostic  bilateral lumbar facet block  Possible bilateral lumbar facet RFA  Diagnostic right L5-S1 LESI  Possible right L5 vs S1 TFESI  Diagnostic bilateral IA knee injections (w/ steroid)  Possible series of 5 IA Hyalgan knee injections  Diagnostic bilateral Genicular NB  Possible bilateral Genicular nerve RFA    Completed:   None.   Therapeutic  Palliative (PRN) options:   None.      Recent Visits Date Type Provider Dept  10/13/21 Office Visit Delano Metz, MD [facility]  Showing recent visits within past 90 days and meeting all other requirements Today's Visits Date Type Provider Dept  10/23/21 Procedure visit Delano Metz, MD [facility]  Showing today's visits and meeting all other requirements Future Appointments Date Type Provider Dept  11/06/21 Appointment Delano Metz, MD [facility]  01/14/22 Appointment Delano Metz, MD [facility]  Showing future appointments within next 90 days and meeting all other requirements   Disposition: Discharge home  Discharge (Date  Time): 10/23/2021; 1038 hrs.   Primary Care Physician: Perrin Maltese, PA-C Location: [facility] Note by: Oswaldo Done, MD Date: 10/23/2021; Time: 4:12 PM  DISCLAIMER: Medicine is not an Visual merchandiser. It has no guarantees or warranties. The decision to proceed with this intervention was based on the information collected from the patient. Conclusions were drawn from the patient's questionnaire, interview, and examination. Because information was provided in large part by the patient, it cannot be guaranteed that it has not been purposely or unconsciously manipulated or altered. Every effort has been made to obtain as much accurate, relevant, available data as possible. Always take into account that the treatment will also be dependent on availability of resources and existing treatment guidelines, considered by  other Pain Management Specialists as being common knowledge and practice, at the time of the intervention. It is also important to point out that variation in procedural techniques and pharmacological choices are the acceptable norm. For Medico-Legal review purposes, the indications, contraindications, technique, and results of the these procedures should only be evaluated, judged and interpreted by a Board-Certified Interventional Pain Specialist with extensive familiarity and expertise in the same exact procedure and technique.

## 2021-10-23 NOTE — Progress Notes (Signed)
Safety precautions to be maintained throughout the outpatient stay will include: orient to surroundings, keep bed in low position, maintain call bell within reach at all times, provide assistance with transfer out of bed and ambulation.  

## 2021-10-23 NOTE — Patient Instructions (Addendum)
____________________________________________________________________________________________  Virtual Visits   What is a "Virtual Visit"? It is a Metall[employer]ist (medical visit) that takes place on real time (NOT TEXT or E-MAIL) over the telephone or computer device (desktop, laptop, tablet, smart phone, etc.). It allows for more location flexibility between the patient and the healthcare provider.  Who decides when these types of visits will be used? The physician.  Who is eligible for these types of visits? Only those patients that can be reliably reached over the telephone.  What do you mean by reliably? We do not have time to call everyone multiple times, therefore those that tend to screen calls and then call back later are not suitable candidates for this system. We understand how people are reluctant to pickup on "unknown" calls, therefore, we suggest adding our telephone numbers to your list of "CONTACT(s)". This way, you should be able to readily identify our calls when you receive one. All of our numbers are available below.   Who is not eligible? This option is not available for medication management encounters, specially for controlled substances. Patients on pain medications that fall under the category of controlled substances have to come in for "Face-to-Face" encounters. This is required for mandatory monitoring of these substances. You may be asked to provide a sample for an unannounced urine drug screening test (UDS), and we will need to count your pain pills. Not bringing your pills to be counted may result in no refill. Obviously, neither one of these can be done over the phone.  When will this type of visits be used? You can request a virtual visit whenever you are physically unable to attend a regular appointment. The decision will be made by the physician (or healthcare provider) on a case by case basis.   At what time will I be called? This is an  excellent question. The providers will try to call you whenever they have time available. Do not expect to be called at any specific time. The secretaries will assign you a time for your virtual visit appointment, but this is done simply to keep a list of those patients that need to be called, but not for the purpose of keeping a time schedule. Be advised that the call may come in anytime during the day, between the hours of 8:00 AM and 8::00 PM, depending on provider availability. We do understand that the system is not perfect. If you are unable to be available that day on a moments notice, then request an "in-person" appointment rather than a "virtual visit".  Can I request my medication visits to be "Virtual"? Yes you may request it, but the decision is entirely up to the healthcare provider. Control substances require specific monitoring that requires Face-to-Face encounters. The number of encounters  and the extent of the monitoring is determined on a case by case basis.  Add a new contact to your smart phone and label it "PAIN CLINIC" Under this contact add the following numbers: Main: [phone] (Official Contact Number) Nurses: [phone] (These are outgoing only calling systems. Do not call this number.) Dr. Dossie Arbour: [phone] or [phone] (Outgoing calls only. Do not call this number.)  ____________________________________________________________________________________________  ____________________________________________________________________________________________  Post-Procedure Discharge Instructions  Instructions: Apply ice:  Purpose: This will minimize any swelling and discomfort after procedure.  When: Day of procedure, as soon as you get home. How: Fill a plastic sandwich bag with crushed ice. Cover it with a small towel and apply to injection  site. How long: (15 min on, 15 min off) Apply for 15 minutes then remove x 15 minutes.  Repeat sequence on day of  procedure, until you go to bed. Apply heat:  Purpose: To treat any soreness and discomfort from the procedure. When: Starting the next day after the procedure. How: Apply heat to procedure site starting the day following the procedure. How long: May continue to repeat daily, until discomfort goes away. Food intake: Start with clear liquids (like water) and advance to regular food, as tolerated.  Physical activities: Keep activities to a minimum for the first 8 hours after the procedure. After that, then as tolerated. Driving: If you have received any sedation, be responsible and do not drive. You are not allowed to drive for 24 hours after having sedation. Blood thinner: (Applies only to those taking blood thinners) You may restart your blood thinner 6 hours after your procedure. Insulin: (Applies only to Diabetic patients taking insulin) As soon as you can eat, you may resume your normal dosing schedule. Infection prevention: Keep procedure site clean and dry. Shower daily and clean area with soap and water. Post-procedure Pain Diary: Extremely important that this be done correctly and accurately. Recorded information will be used to determine the next step in treatment. For the purpose of accuracy, follow these rules: Evaluate only the area treated. Do not report or include pain from an untreated area. For the purpose of this evaluation, ignore all other areas of pain, except for the treated area. After your procedure, avoid taking a long nap and attempting to complete the pain diary after you wake up. Instead, set your alarm clock to go off every hour, on the hour, for the initial 8 hours after the procedure. Document the duration of the numbing medicine, and the relief you are getting from it. Do not go to sleep and attempt to complete it later. It will not be accurate. If you received sedation, it is likely that you were given a medication that may cause amnesia. Because of this, completing the  diary at a later time may cause the information to be inaccurate. This information is needed to plan your care. Follow-up appointment: Keep your post-procedure follow-up evaluation appointment after the procedure (usually 2 weeks for most procedures, 6 weeks for radiofrequencies). DO NOT FORGET to bring you pain diary with you.   Expect: (What should I expect to see with my procedure?) From numbing medicine (AKA: Local Anesthetics): Numbness or decrease in pain. You may also experience some weakness, which if present, could last for the duration of the local anesthetic. Onset: Full effect within 15 minutes of injected. Duration: It will depend on the type of local anesthetic used. On the average, 1 to 8 hours.  From steroids (Applies only if steroids were used): Decrease in swelling or inflammation. Once inflammation is improved, relief of the pain will follow. Onset of benefits: Depends on the amount of swelling present. The more swelling, the longer it will take for the benefits to be seen. In some cases, up to 10 days. Duration: Steroids will stay in the system x 2 weeks. Duration of benefits will depend on multiple posibilities including persistent irritating factors. Side-effects: If present, they may typically last 2 weeks (the duration of the steroids). Frequent: Cramps (if they occur, drink Gatorade and take over-the-counter Magnesium 450-500 mg once to twice a day); water retention with temporary weight gain; increases in blood sugar; decreased immune system response; increased appetite. Occasional: Facial flushing (red, warm  cheeks); mood swings; menstrual changes. Uncommon: Long-term decrease or suppression of natural hormones; bone thinning. (These are more common with higher doses or more frequent use. This is why we prefer that our patients avoid having any injection therapies in other practices.)  Very Rare: Severe mood changes; psychosis; aseptic necrosis. From procedure: Some  discomfort is to be expected once the numbing medicine wears off. This should be minimal if ice and heat are applied as instructed.  Call if: (When should I call?) You experience numbness and weakness that gets worse with time, as opposed to wearing off. New onset bowel or bladder incontinence. (Applies only to procedures done in the spine)  Emergency Numbers: Durning business hours (Monday - Thursday, 8:00 AM - 4:00 PM) (Friday, 9:00 AM - 12:00 Noon): [phone] After hours: [phone] NOTE: If you are having a problem and are unable connect with, or to talk to a provider, then go to your nearest urgent care or emergency department. If the problem is serious and urgent, please call 911. ____________________________________________________________________________________________  ____________________________________________________________________________________________  Post-Procedure Discharge Instructions  Instructions: Apply ice:  Purpose: This will minimize any swelling and discomfort after procedure.  When: Day of procedure, as soon as you get home. How: Fill a plastic sandwich bag with crushed ice. Cover it with a small towel and apply to injection site. How long: (15 min on, 15 min off) Apply for 15 minutes then remove x 15 minutes.  Repeat sequence on day of procedure, until you go to bed. Apply heat:  Purpose: To treat any soreness and discomfort from the procedure. When: Starting the next day after the procedure. How: Apply heat to procedure site starting the day following the procedure. How long: May continue to repeat daily, until discomfort goes away. Food intake: Start with clear liquids (like water) and advance to regular food, as tolerated.  Physical activities: Keep activities to a minimum for the first 8 hours after the procedure. After that, then as tolerated. Driving: If you have received any sedation, be responsible and do not drive. You are not allowed to  drive for 24 hours after having sedation. Blood thinner: (Applies only to those taking blood thinners) You may restart your blood thinner 6 hours after your procedure. Insulin: (Applies only to Diabetic patients taking insulin) As soon as you can eat, you may resume your normal dosing schedule. Infection prevention: Keep procedure site clean and dry. Shower daily and clean area with soap and water. Post-procedure Pain Diary: Extremely important that this be done correctly and accurately. Recorded information will be used to determine the next step in treatment. For the purpose of accuracy, follow these rules: Evaluate only the area treated. Do not report or include pain from an untreated area. For the purpose of this evaluation, ignore all other areas of pain, except for the treated area. After your procedure, avoid taking a long nap and attempting to complete the pain diary after you wake up. Instead, set your alarm clock to go off every hour, on the hour, for the initial 8 hours after the procedure. Document the duration of the numbing medicine, and the relief you are getting from it. Do not go to sleep and attempt to complete it later. It will not be accurate. If you received sedation, it is likely that you were given a medication that may cause amnesia. Because of this, completing the diary at a later time may cause the information to be inaccurate. This information is needed to plan your  care. Follow-up appointment: Keep your post-procedure follow-up evaluation appointment after the procedure (usually 2 weeks for most procedures, 6 weeks for radiofrequencies). DO NOT FORGET to bring you pain diary with you.   Expect: (What should I expect to see with my procedure?) From numbing medicine (AKA: Local Anesthetics): Numbness or decrease in pain. You may also experience some weakness, which if present, could last for the duration of the local anesthetic. Onset: Full effect within 15 minutes of  injected. Duration: It will depend on the type of local anesthetic used. On the average, 1 to 8 hours.  From steroids (Applies only if steroids were used): Decrease in swelling or inflammation. Once inflammation is improved, relief of the pain will follow. Onset of benefits: Depends on the amount of swelling present. The more swelling, the longer it will take for the benefits to be seen. In some cases, up to 10 days. Duration: Steroids will stay in the system x 2 weeks. Duration of benefits will depend on multiple posibilities including persistent irritating factors. Side-effects: If present, they may typically last 2 weeks (the duration of the steroids). Frequent: Cramps (if they occur, drink Gatorade and take over-the-counter Magnesium 450-500 mg once to twice a day); water retention with temporary weight gain; increases in blood sugar; decreased immune system response; increased appetite. Occasional: Facial flushing (red, warm cheeks); mood swings; menstrual changes. Uncommon: Long-term decrease or suppression of natural hormones; bone thinning. (These are more common with higher doses or more frequent use. This is why we prefer that our patients avoid having any injection therapies in other practices.)  Very Rare: Severe mood changes; psychosis; aseptic necrosis. From procedure: Some discomfort is to be expected once the numbing medicine wears off. This should be minimal if ice and heat are applied as instructed.  Call if: (When should I call?) You experience numbness and weakness that gets worse with time, as opposed to wearing off. New onset bowel or bladder incontinence. (Applies only to procedures done in the spine)  Emergency Numbers: Durning business hours (Monday - Thursday, 8:00 AM - 4:00 PM) (Friday, 9:00 AM - 12:00 Noon): [phone] After hours: [phone] NOTE: If you are having a problem and are unable connect with, or to talk to a provider, then go to your nearest urgent  care or emergency department. If the problem is serious and urgent, please call 911. ____________________________________________________________________________________________

## 2021-10-24 ENCOUNTER — Telehealth: Payer: Self-pay | Admitting: *Deleted

## 2021-10-24 NOTE — Telephone Encounter (Signed)
Attempted to call for pre appointment review of allergies/meds. No answer, unable to leave a message. 

## 2021-11-06 ENCOUNTER — Ambulatory Visit: Payer: 59 | Attending: Pain Medicine | Admitting: Pain Medicine

## 2021-11-06 ENCOUNTER — Encounter: Payer: Self-pay | Admitting: Pain Medicine

## 2021-11-06 VITALS — BP 133/91 | HR 114 | Temp 97.0°F | Resp 18 | Ht 66.0 in | Wt 165.0 lb

## 2021-11-06 DIAGNOSIS — G894 Chronic pain syndrome: Secondary | ICD-10-CM | POA: Diagnosis present

## 2021-11-06 DIAGNOSIS — G4486 Cervicogenic headache: Secondary | ICD-10-CM | POA: Insufficient documentation

## 2021-11-06 DIAGNOSIS — G8929 Other chronic pain: Secondary | ICD-10-CM | POA: Diagnosis present

## 2021-11-06 DIAGNOSIS — M25512 Pain in left shoulder: Secondary | ICD-10-CM | POA: Insufficient documentation

## 2021-11-06 DIAGNOSIS — M542 Cervicalgia: Secondary | ICD-10-CM | POA: Diagnosis present

## 2021-11-06 DIAGNOSIS — M25511 Pain in right shoulder: Secondary | ICD-10-CM | POA: Diagnosis present

## 2021-11-06 DIAGNOSIS — M5412 Radiculopathy, cervical region: Secondary | ICD-10-CM | POA: Insufficient documentation

## 2021-11-06 NOTE — Progress Notes (Signed)
PROVIDER NOTE: Information contained herein reflects review and annotations entered in association with encounter. Interpretation of such information and data should be left to medically-trained personnel. Information provided to patient can be located elsewhere in the medical record under "Patient Instructions". Document created using STT-dictation technology, any transcriptional errors that may result from process are unintentional.    Patient: Holly Spears  Service Category: E/M  Provider: Gaspar Cola, MD  DOB: [birth year]  DOS: 11/06/2021  Referring Provider: Mike Craze  MRN: [id number]  Specialty: Interventional Pain Management  PCP: Lonzo Cloud, PA-C  Type: Established Patient  Setting: Ambulatory outpatient    Location: Office  Delivery: Face-to-face     HPI  Ms. Lunabella Badgett, a 51 y.o. year old female, is here today because of her Chronic neck pain [M54.2, G89.29]. Ms. Achterberg's primary complain today is Back Pain Last encounter: My last encounter with her was on 10/23/2021. Pertinent problems: Ms. 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; Lumbar spondylosis (L3-4, L4-5, and L5-S1); Lumbar bulging disc (L3-4, L4-5, and L5-S1); Chronic lumbar radicular pain (Bilateral) (R>L); Chronic knee pain (3ry area of Pain) (Bilateral) (R>L); Chronic upper back pain (Left); Thoracic disc herniation (Large, Left paracentral T10-11 disc herniation);  Thoracic spinal stenosis (Severe, Left, T10-11 Lateral Recess Stenosis); Thoracic foraminal stenosis (Severe Left T10-11); History of bilateral carpal tunnel release (Bilateral); Chronic hand pain (Bilateral) (R>L); Muscle weakness; Chronic lower extremity pain (2ry area of Pain) (Bilateral); Neck pain; Spasm of back muscles; Myalgia and myositis; Fibromyalgia; Sjogren's syndrome (HCC); DDD (degenerative disc disease), cervical; and Right cervical radiculopathy (C8) on their pertinent problem list. Pain Assessment: Severity of Chronic pain is reported as a 3 /10. Location: Back Lower/radiates down right leg in the back to the heel. Onset: More than a month ago. Quality: Aching, Burning, Throbbing. Timing: Constant. Modifying factor(s): heat and meds. Vitals:  height is _0  (1.676 m) and weight is 165 lb (74.8 kg). Her temperature is 97 F (36.1 C) (abnormal). Her blood pressure is 133/91 (abnormal) and her pulse is 114 (abnormal). Her respiration is 18 and oxygen saturation is 100%.   Reason for encounter: post-procedure evaluation and assessment.  The patient is experiencing an ongoing 52% improvement of her upper extremity pain and numbness.  The only thing that remains is some pain over the right suprascapular region.  For this reason, I will be bringing the patient back for a diagnostic right suprascapular nerve block under fluoroscopic guidance.  She refers that due to a really bad experience that she had at [facility] with nerve blocks, she was extremely anxious.  However, she now realizes that under expert hands interventional therapies can be extremely safe.  However, she was extremely anxious and indicated not having been able to sleep well that night secondary to her anxiety which in turn was causing her to have nausea.  In view of the fact that she did not experience any problems with our intervention, she now states that she would be willing to have this repeated again.  I have communicated to  her  her current options which include repeating the cervical epidural steroid injection, doing a diagnostic right suprascapular nerve block, or simply not doing anything.  She states that her current area of pain is the suprascapular region and for that reason I have recommended that we try this suprascapular nerve block and determine if that can help her with that particular pain.  I have reminded her that we can always go back in repeat the cervical epidural steroid injection if her upper extremity pains and numbness begins to come back.  She understood and accepted.  She also commented that the cervical epidural has helped with her range of motion and her ability to use her upper extremities to a point where she feels rather normal now and she is not impaired as she was before.  Post-procedure evaluation   Type: Cervical Epidural Steroid injection (ESI) (Interlaminar) #1  Laterality: Right  Level: C7-T1 Imaging: Fluoroscopy-assisted DOS: 10/23/2021  Performed by: Milinda Pointer, MD Anesthesia: Local anesthesia (1-2% Lidocaine) Anxiolysis: None                 Sedation: No Sedation                         Purpose: Diagnostic/Therapeutic Indications: Cervicalgia, cervical radicular pain, degenerative disc disease, severe enough to impact quality of life or function. 1. Chronic neck pain (4th area of Pain)   2. DDD (degenerative disc disease), cervical   3. Cervicogenic headache   4. Bulge of cervical disc without myelopathy (C5-6)   5. Cervical disc herniation (C6-7) (Left)   6. Cervical spondylosis (C5-6 and C6-7)   7. Chronic cervical radicular pain (Bilateral) (R>L)    NAS-11 score:   Pre-procedure: 5 /10   Post-procedure: 4 /10      Effectiveness:  Initial hour after procedure: 10 %. Subsequent 4-6 hours post-procedure: 10 %. Analgesia past initial 6 hours: 75 % (finger numbness is gone, still has pain in right suprascapular.). Ongoing improvement:  Analgesic: The patient  indicates an ongoing 75% improvement with the numbness and stiffness of her fingers and upper extremities being gone.  However, she still has pain over the right suprascapular area. Function: Ms. Hoffmeister reports improvement in function ROM: Ms. Lanagan reports improvement in ROM  Pharmacotherapy Assessment  Analgesic: Oxycodone IR 10 mg, 1 tab PO q 6 hrs (40 mg/day of oxycodone) MME/day: 60 mg/day.   Monitoring: [place] PMP: PDMP reviewed during this encounter.       Pharmacotherapy: No side-effects or adverse reactions reported. Compliance: No problems identified. Effectiveness: Clinically acceptable.  Dewayne Shorter, RN  11/06/2021  2:45 PM  Sign when Signing Visit Safety precautions to be maintained throughout the outpatient stay will include: orient to surroundings, keep bed in low position, maintain call bell within reach at all times, provide assistance with transfer out of bed and ambulation.    No results found for: "CBDTHCR" No results found for: "D8THCCBX" No results found for: "D9THCCBX"  UDS:  Summary  Date Value Ref Range Status  07/14/2021 Note  Final    Comment:    ==================================================================== [employer] Select 13 (MW) ==================================================================== Test                             Result       Flag       Units  Drug Present and Declared for Prescription Verification   Oxycodone  749          EXPECTED   ng/mg creat   Oxymorphone                    2496         EXPECTED   ng/mg creat   Noroxycodone                   6964         EXPECTED   ng/mg creat   Noroxymorphone                 1100         EXPECTED   ng/mg creat    Sources of oxycodone are scheduled prescription medications.    Oxymorphone, noroxycodone, and noroxymorphone are expected    metabolites of oxycodone. Oxymorphone is also available as a    scheduled prescription  medication.  ==================================================================== Test                      Result    Flag   Units      Ref Range   Creatinine              47               mg/dL      >=20 ==================================================================== Declared Medications:  The flagging and interpretation on this report are based on the  following declared medications.  Unexpected results may arise from  inaccuracies in the declared medications.   **Note: The testing scope of this panel includes these medications:   Oxycodone   **Note: The testing scope of this panel does not include the  following reported medications:   Biotin  Bupropion (Wellbutrin)  Calcium  Citalopram (Celexa)  Cyclobenzaprine (Flexeril)  Epinephrine (EpiPen)  Famotidine (Pepcid)  Fluoride (Prevident)  Hydroxychloroquine (Plaquenil)  Ibuprofen (Advil)  Levonorgestrel (Mirena)  Levothyroxine (Synthroid)  Magnesium  Multivitamin  Naloxone (Narcan)  Potassium (Klor-Con)  Propranolol (Inderal)  Vitamin D  Vitamin D3  Zinc ==================================================================== For clinical consultation, please call [phone]. ====================================================================       ROS  Constitutional: Denies any fever or chills Gastrointestinal: No reported hemesis, hematochezia, vomiting, or acute GI distress Musculoskeletal: Denies any acute onset joint swelling, redness, loss of ROM, or weakness Neurological: No reported episodes of acute onset apraxia, aphasia, dysarthria, agnosia, amnesia, paralysis, loss of coordination, or loss of consciousness  Medication Review  Biotin, Calcium Carbonate-Vitamin D, EPINEPHrine, Magnesium, Multi-Vitamins, Oxycodone HCl, Sodium Fluoride, Vitamin D3, Zinc, buPROPion, citalopram, cyclobenzaprine, famotidine, hydroxychloroquine, ibuprofen, levothyroxine, naloxone, and potassium chloride SA  History  Review  Allergy: Ms. Thatch is allergic to bee venom, penicillins, shellfish allergy, sulfa antibiotics, erythromycin, zaleplon, codeine, and trazodone. Drug: Ms. Twiford  reports no history of drug use. Alcohol:  reports no history of alcohol use. Tobacco:  reports that she has quit smoking. She has never used smokeless tobacco. Social: Ms. Easterwood  reports that she has quit smoking. She has never used smokeless tobacco. She reports that she does not drink alcohol and does not use drugs. Medical:  has a past medical history of Acid reflux (12/15/2011), Anxiety, generalized (12/15/2011), Arthritis, Cervical pain (12/15/2011), Depression, GERD (gastroesophageal reflux disease), Hiatal hernia, LBP (low back pain) (12/15/2011), Migraine, Muscle ache (12/15/2011), Osteopenia of left hip, Pain (12/27/2012), Rheumatoid arthritis ([place]) (12/15/2011), and Scratched cornea (10/25/2015). Surgical: Ms. Hildenbrand  has a past surgical history that includes Cholecystectomy and Abdominal hysterectomy. Family: family history includes  Depression in her mother; Diabetes in her father; Hyperlipidemia in her mother; Hypertension in her father and mother.  Laboratory Chemistry Profile   Renal Lab Results  Component Value Date   BUN 6 03/03/2018   CREATININE 0.63 03/03/2018   BCR 10 03/03/2018   GFRAA 123 03/03/2018   GFRNONAA 106 03/03/2018    Hepatic Lab Results  Component Value Date   AST 21 03/03/2018   ALT 26 03/27/2015   ALBUMIN 4.6 03/03/2018   ALKPHOS 71 03/03/2018    Electrolytes Lab Results  Component Value Date   NA 136 03/03/2018   K 3.9 03/03/2018   CL 97 03/03/2018   CALCIUM 8.9 03/03/2018   MG 2.1 03/03/2018    Bone Lab Results  Component Value Date   VD25OH 29.3 (L) 12/31/2014    Inflammation (CRP: Acute Phase) (ESR: Chronic Phase) Lab Results  Component Value Date   CRP <1 03/03/2018   ESRSEDRATE 2 03/03/2018         Note: Above Lab results reviewed.  Recent Imaging Review   DG PAIN CLINIC C-ARM 1-60 MIN NO REPORT Fluoro was used, but no Radiologist interpretation will be provided.  Please refer to "NOTES" tab for provider progress note. Note: Reviewed        Physical Exam  General appearance: Well nourished, well developed, and well hydrated. In no apparent acute distress Mental status: Alert, oriented x 3 (person, place, & time)       Respiratory: No evidence of acute respiratory distress Eyes: PERLA Vitals: BP (!) 133/91   Pulse (!) 114   Temp (!) 97 F (36.1 C)   Resp 18   Ht _0  (1.676 m)   Wt 165 lb (74.8 kg)   SpO2 100%   BMI 26.63 kg/m  BMI: Estimated body mass index is 26.63 kg/m as calculated from the following:   Height as of this encounter: _1  (1.676 m).   Weight as of this encounter: 165 lb (74.8 kg). Ideal: Ideal body weight: 59.3 kg (130 lb 11.7 oz) Adjusted ideal body weight: 65.5 kg (144 lb 7 oz)  Assessment   Diagnosis Status  1. Chronic neck pain (4th area of Pain)   2. Cervicogenic headache   3. Chronic cervical radicular pain (Bilateral) (R>L)   4. Cervicalgia   5. Chronic pain syndrome   6. Chronic shoulder pain (5th area of Pain) (Bilateral) (R>L)    Controlled Controlled Controlled   Updated Problems: No problems updated.   Plan of Care  Problem-specific:  No problem-specific Assessment & Plan notes found for this encounter.  Ms. Tarrie Mcmichen has a current medication list which includes the following long-term medication(s): bupropion, calcium carbonate-vitamin d, citalopram, famotidine, levothyroxine, oxycodone hcl, [START ON 11/19/2021] oxycodone hcl, [START ON 12/19/2021] oxycodone hcl, cyclobenzaprine, and naloxone.  Pharmacotherapy (Medications Ordered): No orders of the defined types were placed in this encounter.  Orders:  Orders Placed This Encounter  Procedures   SUPRASCAPULAR NERVE BLOCK    For shoulder pain.    Standing Status:   Future    Standing Expiration Date:   02/06/2022     Scheduling Instructions:     Purpose: Diagnostic     Laterality: Right-sided     Level(s): Suprascapular notch     Sedation: Patient's choice.     Scheduling Timeframe: As permitted by the schedule    Order Specific Question:   Where will this procedure be performed?    Answer:   [facility]  Follow-up plan:   Return for procedure (ECT): (R) Spuprascapular NB #1.     Interventional Therapies  Risk  Complexity Considerations:   Estimated body mass index is 27.44 kg/m as calculated from the following:   Height as of this encounter: _0  (1.676 m).   Weight as of this encounter: 170 lb (77.1 kg). WNL   Planned  Pending:   The patient had a bad experience with another physician receiving blocks.   Under consideration:   Diagnostic bilateral lumbar facet block  Possible bilateral lumbar facet RFA  Diagnostic right L5-S1 LESI  Possible right L5 vs S1 TFESI  Diagnostic bilateral IA knee injections (w/ steroid)  Possible series of 5 IA Hyalgan knee injections  Diagnostic bilateral Genicular NB  Possible bilateral Genicular nerve RFA    Completed:   None.   Therapeutic  Palliative (PRN) options:   None.       Recent Visits Date Type Provider Dept  10/23/21 Procedure visit Milinda Pointer, MD [facility]  10/13/21 Office Visit Milinda Pointer, MD [facility]  Showing recent visits within past 90 days and meeting all other requirements Today's Visits Date Type Provider Dept  11/06/21 Office Visit Milinda Pointer, MD [facility]  Showing today's visits and meeting all other requirements Future Appointments Date Type Provider Dept  01/14/22 Appointment Milinda Pointer, MD [facility]  Showing future appointments within next 90 days and meeting all other requirements  I discussed the assessment and treatment plan with the patient. The patient was provided an opportunity to ask questions and all were answered.  The patient agreed with the plan and demonstrated an understanding of the instructions.  Patient advised to call back or seek an in-person evaluation if the symptoms or condition worsens.  Duration of encounter: 30 minutes.  Total time on encounter, as per AMA guidelines included both the face-to-face and non-face-to-face time personally spent by the physician and/or other qualified health care professional(s) on the day of the encounter (includes time in activities that require the physician or other qualified health care professional and does not include time in activities normally performed by clinical staff). Physician's time may include the following activities when performed: preparing to see the patient (eg, review of tests, pre-charting review of records) obtaining and/or reviewing separately obtained history performing a medically appropriate examination and/or evaluation counseling and educating the patient/family/caregiver ordering medications, tests, or procedures referring and communicating with other health care professionals (when not separately reported) documenting clinical information in the electronic or other health record independently interpreting results (not separately reported) and communicating results to the patient/ family/caregiver care coordination (not separately reported)  Note by: Gaspar Cola, MD Date: 11/06/2021; Time: 3:14 PM

## 2021-11-06 NOTE — Progress Notes (Signed)
Safety precautions to be maintained throughout the outpatient stay will include: orient to surroundings, keep bed in low position, maintain call bell within reach at all times, provide assistance with transfer out of bed and ambulation.  

## 2021-11-06 NOTE — Patient Instructions (Signed)
______________________________________________________________________  Preparing for Procedure with Sedation  NOTICE: Due to recent regulatory changes, starting on September 02, 2020, procedures requiring intravenous (IV) sedation will no longer be performed at the [facility].  These types of procedures are required to be performed at [facility] ambulatory surgery facility.  We are very sorry for the inconvenience.  Procedure appointments are limited to planned procedures: No Prescription Refills. No disability issues will be discussed. No medication changes will be discussed.  Instructions: Oral Intake: Do not eat or drink anything for at least 8 hours prior to your procedure. (Exception: Blood Pressure Medication. See below.) Transportation: A driver is required. You may not drive yourself after the procedure. Blood Pressure Medicine: Do not forget to take your blood pressure medicine with a sip of water the morning of the procedure. If your Diastolic (lower reading) is above 100 mmHg, elective cases will be cancelled/rescheduled. Blood thinners: These will need to be stopped for procedures. Notify our staff if you are taking any blood thinners. Depending on which one you take, there will be specific instructions on how and when to stop it. Diabetics on insulin: Notify the staff so that you can be scheduled 1st case in the morning. If your diabetes requires high dose insulin, take only  of your normal insulin dose the morning of the procedure and notify the staff that you have done so. Preventing infections: Shower with an antibacterial soap the morning of your procedure. Build-up your immune system: Take 1000 mg of Vitamin C with every meal (3 times a day) the day prior to your procedure. Antibiotics: Inform the staff if you have a condition or reason that requires you to take antibiotics before dental procedures. Pregnancy: If you are pregnant, call and cancel the procedure. Sickness: If  you have a cold, fever, or any active infections, call and cancel the procedure. Arrival: You must be in the facility at least 30 minutes prior to your scheduled procedure. Children: Do not bring children with you. Dress appropriately: There is always the possibility that your clothing may get soiled. Valuables: Do not bring any jewelry or valuables.  Reasons to call and reschedule or cancel your procedure: (Following these recommendations will minimize the risk of a serious complication.) Surgeries: Avoid having procedures within 2 weeks of any surgery. (Avoid for 2 weeks before or after any surgery). Flu Shots: Avoid having procedures within 2 weeks of a flu shots. (Avoid for 2 weeks before or after immunizations). Barium: Avoid having a procedure within 7-10 days after having had a radiological study involving the use of radiological contrast. (Myelograms, Barium swallow or enema study). Heart attacks: Avoid any elective procedures or surgeries for the initial 6 months after a "Myocardial Infarction" (Heart Attack). Blood thinners: It is imperative that you stop these medications before procedures. Let us know if you if you take any blood thinner.  Infection: Avoid procedures during or within two weeks of an infection (including chest colds or gastrointestinal problems). Symptoms associated with infections include: Localized redness, fever, chills, night sweats or profuse sweating, burning sensation when voiding, cough, congestion, stuffiness, runny nose, sore throat, diarrhea, nausea, vomiting, cold or Flu symptoms, recent or current infections. It is specially important if the infection is over the area that we intend to treat. Heart and lung problems: Symptoms that may suggest an active cardiopulmonary problem include: cough, chest pain, breathing difficulties or shortness of breath, dizziness, ankle swelling, uncontrolled high or unusually low blood pressure, and/or palpitations. If you are    experiencing any of these symptoms, cancel your procedure and contact your primary care physician for an evaluation.  Remember:  Regular Business hours are:  Monday to Thursday 8:00 AM to 4:00 PM  Provider's Schedule: Octivia Canion, MD:  Procedure days: Tuesday and Thursday 7:30 AM to 4:00 PM  Bilal Lateef, MD:  Procedure days: Monday and Wednesday 7:30 AM to 4:00 PM ______________________________________________________________________  ____________________________________________________________________________________________  General Risks and Possible Complications  Patient Responsibilities: It is important that you read this as it is part of your informed consent. It is our duty to inform you of the risks and possible complications associated with treatments offered to you. It is your responsibility as a patient to read this and to ask questions about anything that is not clear or that you believe was not covered in this document.  Patient's Rights: You have the right to refuse treatment. You also have the right to change your mind, even after initially having agreed to have the treatment done. However, under this last option, if you wait until the last second to change your mind, you may be charged for the materials used up to that point.  Introduction: Medicine is not an exact science. Everything in Medicine, including the lack of treatment(s), carries the potential for danger, harm, or loss (which is by definition: Risk). In Medicine, a complication is a secondary problem, condition, or disease that can aggravate an already existing one. All treatments carry the risk of possible complications. The fact that a side effects or complications occurs, does not imply that the treatment was conducted incorrectly. It must be clearly understood that these can happen even when everything is done following the highest safety standards.  No treatment: You can choose not to proceed with the  proposed treatment alternative. The "PRO(s)" would include: avoiding the risk of complications associated with the therapy. The "CON(s)" would include: not getting any of the treatment benefits. These benefits fall under one of three categories: diagnostic; therapeutic; and/or palliative. Diagnostic benefits include: getting information which can ultimately lead to improvement of the disease or symptom(s). Therapeutic benefits are those associated with the successful treatment of the disease. Finally, palliative benefits are those related to the decrease of the primary symptoms, without necessarily curing the condition (example: decreasing the pain from a flare-up of a chronic condition, such as incurable terminal cancer).  General Risks and Complications: These are associated to most interventional treatments. They can occur alone, or in combination. They fall under one of the following six (6) categories: no benefit or worsening of symptoms; bleeding; infection; nerve damage; allergic reactions; and/or death. No benefits or worsening of symptoms: In Medicine there are no guarantees, only probabilities. No healthcare provider can ever guarantee that a medical treatment will work, they can only state the probability that it may. Furthermore, there is always the possibility that the condition may worsen, either directly, or indirectly, as a consequence of the treatment. Bleeding: This is more common if the patient is taking a blood thinner, either prescription or over the counter (example: Goody Powders, Fish oil, Aspirin, Garlic, etc.), or if suffering a condition associated with impaired coagulation (example: Hemophilia, cirrhosis of the liver, low platelet counts, etc.). However, even if you do not have one on these, it can still happen. If you have any of these conditions, or take one of these drugs, make sure to notify your treating physician. Infection: This is more common in patients with a compromised  immune system, either due to disease (example:   diabetes, cancer, human immunodeficiency virus [HIV], etc.), or due to medications or treatments (example: therapies used to treat cancer and rheumatological diseases). However, even if you do not have one on these, it can still happen. If you have any of these conditions, or take one of these drugs, make sure to notify your treating physician. Nerve Damage: This is more common when the treatment is an invasive one, but it can also happen with the use of medications, such as those used in the treatment of cancer. The damage can occur to small secondary nerves, or to large primary ones, such as those in the spinal cord and brain. This damage may be temporary or permanent and it may lead to impairments that can range from temporary numbness to permanent paralysis and/or brain death. Allergic Reactions: Any time a substance or material comes in contact with our body, there is the possibility of an allergic reaction. These can range from a mild skin rash (contact dermatitis) to a severe systemic reaction (anaphylactic reaction), which can result in death. Death: In general, any medical intervention can result in death, most of the time due to an unforeseen complication. ____________________________________________________________________________________________  

## 2021-11-25 ENCOUNTER — Ambulatory Visit: Payer: 59 | Attending: Pain Medicine | Admitting: Pain Medicine

## 2021-11-25 ENCOUNTER — Ambulatory Visit
Admission: RE | Admit: 2021-11-25 | Discharge: 2021-11-25 | Disposition: A | Discharge status: Home / Self Care | Payer: 59 | Source: Ambulatory Visit | Attending: Pain Medicine | Admitting: Pain Medicine

## 2021-11-25 ENCOUNTER — Encounter: Payer: Self-pay | Admitting: Pain Medicine

## 2021-11-25 VITALS — BP 156/107 | HR 102 | Temp 97.0°F | Resp 18 | Ht 66.0 in | Wt 165.0 lb

## 2021-11-25 DIAGNOSIS — M25511 Pain in right shoulder: Secondary | ICD-10-CM | POA: Diagnosis present

## 2021-11-25 DIAGNOSIS — M62838 Other muscle spasm: Secondary | ICD-10-CM

## 2021-11-25 DIAGNOSIS — M7591 Shoulder lesion, unspecified, right shoulder: Secondary | ICD-10-CM

## 2021-11-25 DIAGNOSIS — M778 Other enthesopathies, not elsewhere classified: Secondary | ICD-10-CM | POA: Diagnosis not present

## 2021-11-25 DIAGNOSIS — M25512 Pain in left shoulder: Secondary | ICD-10-CM | POA: Diagnosis not present

## 2021-11-25 DIAGNOSIS — R937 Abnormal findings on diagnostic imaging of other parts of musculoskeletal system: Secondary | ICD-10-CM | POA: Insufficient documentation

## 2021-11-25 DIAGNOSIS — G8929 Other chronic pain: Secondary | ICD-10-CM | POA: Diagnosis not present

## 2021-11-25 DIAGNOSIS — M7918 Myalgia, other site: Secondary | ICD-10-CM

## 2021-11-25 DIAGNOSIS — M62830 Muscle spasm of back: Secondary | ICD-10-CM | POA: Diagnosis not present

## 2021-11-25 MED ORDER — ROPIVACAINE HCL 2 MG/ML IJ SOLN
4.0000 mL | Freq: Once | INTRAMUSCULAR | Status: AC
  Administered 2021-11-25: 4 mL via PERINEURAL
  Filled 2021-11-25: qty 20

## 2021-11-25 MED ORDER — TRIAMCINOLONE ACETONIDE 40 MG/ML IJ SUSP
40.0000 mg | Freq: Once | INTRAMUSCULAR | Status: AC
  Administered 2021-11-25: 40 mg
  Filled 2021-11-25: qty 1

## 2021-11-25 MED ORDER — LACTATED RINGERS IV SOLN: Freq: Once | INTRAVENOUS | Status: DC

## 2021-11-25 MED ORDER — PENTAFLUOROPROP-TETRAFLUOROETH EX AERO
INHALATION_SPRAY | Freq: Once | CUTANEOUS | Status: DC
  Filled 2021-11-25: qty 116

## 2021-11-25 MED ORDER — MIDAZOLAM HCL 5 MG/5ML IJ SOLN: 0.5000 mg | Freq: Once | INTRAMUSCULAR | Status: DC

## 2021-11-25 MED ORDER — LIDOCAINE HCL 2 % IJ SOLN
20.0000 mL | Freq: Once | INTRAMUSCULAR | Status: AC
  Administered 2021-11-25: 400 mg
  Filled 2021-11-25: qty 40

## 2021-11-25 NOTE — Patient Instructions (Addendum)
____________________________________________________________________________________________  Virtual Visits   What is a "Virtual Visit"? It is a Metallurgist (medical visit) that takes place on real time (NOT TEXT or E-MAIL) over the telephone or computer device (desktop, laptop, tablet, smart phone, etc.). It allows for more location flexibility between the patient and the healthcare provider.  Who decides when these types of visits will be used? The physician.  Who is eligible for these types of visits? Only those patients that can be reliably reached over the telephone.  What do you mean by reliably? We do not have time to call everyone multiple times, therefore those that tend to screen calls and then call back later are not suitable candidates for this system. We understand how people are reluctant to pickup on "unknown" calls, therefore, we suggest adding our telephone numbers to your list of "CONTACT(s)". This way, you should be able to readily identify our calls when you receive one. All of our numbers are available below.   Who is not eligible? This option is not available for medication management encounters, specially for controlled substances. Patients on pain medications that fall under the category of controlled substances have to come in for "Face-to-Face" encounters. This is required for mandatory monitoring of these substances. You may be asked to provide a sample for an unannounced urine drug screening test (UDS), and we will need to count your pain pills. Not bringing your pills to be counted may result in no refill. Obviously, neither one of these can be done over the phone.  When will this type of visits be used? You can request a virtual visit whenever you are physically unable to attend a regular appointment. The decision will be made by the physician (or healthcare provider) on a case by case basis.   At what time will I be called? This is an  excellent question. The providers will try to call you whenever they have time available. Do not expect to be called at any specific time. The secretaries will assign you a time for your virtual visit appointment, but this is done simply to keep a list of those patients that need to be called, but not for the purpose of keeping a time schedule. Be advised that the call may come in anytime during the day, between the hours of 8:00 AM and 8::00 PM, depending on provider availability. We do understand that the system is not perfect. If you are unable to be available that day on a moments notice, then request an "in-person" appointment rather than a "virtual visit".  Can I request my medication visits to be "Virtual"? Yes you may request it, but the decision is entirely up to the healthcare provider. Control substances require specific monitoring that requires Face-to-Face encounters. The number of encounters  and the extent of the monitoring is determined on a case by case basis.  Add a new contact to your smart phone and label it "PAIN CLINIC" Under this contact add the following numbers: Main: [phone] (Official Contact Number) Nurses: [phone] (These are outgoing only calling systems. Do not call this number.) Dr. Dossie Arbour: [phone] or [phone] (Outgoing calls only. Do not call this number.)  ____________________________________________________________________________________________  ____________________________________________________________________________________________  Post-Procedure Discharge Instructions  Instructions: Apply ice:  Purpose: This will minimize any swelling and discomfort after procedure.  When: Day of procedure, as soon as you get home. How: Fill a plastic sandwich bag with crushed ice. Cover it with a small towel and apply to injection  site. How long: (15 min on, 15 min off) Apply for 15 minutes then remove x 15 minutes.  Repeat sequence on day of  procedure, until you go to bed. Apply heat:  Purpose: To treat any soreness and discomfort from the procedure. When: Starting the next day after the procedure. How: Apply heat to procedure site starting the day following the procedure. How long: May continue to repeat daily, until discomfort goes away. Food intake: Start with clear liquids (like water) and advance to regular food, as tolerated.  Physical activities: Keep activities to a minimum for the first 8 hours after the procedure. After that, then as tolerated. Driving: If you have received any sedation, be responsible and do not drive. You are not allowed to drive for 24 hours after having sedation. Blood thinner: (Applies only to those taking blood thinners) You may restart your blood thinner 6 hours after your procedure. Insulin: (Applies only to Diabetic patients taking insulin) As soon as you can eat, you may resume your normal dosing schedule. Infection prevention: Keep procedure site clean and dry. Shower daily and clean area with soap and water. Post-procedure Pain Diary: Extremely important that this be done correctly and accurately. Recorded information will be used to determine the next step in treatment. For the purpose of accuracy, follow these rules: Evaluate only the area treated. Do not report or include pain from an untreated area. For the purpose of this evaluation, ignore all other areas of pain, except for the treated area. After your procedure, avoid taking a long nap and attempting to complete the pain diary after you wake up. Instead, set your alarm clock to go off every hour, on the hour, for the initial 8 hours after the procedure. Document the duration of the numbing medicine, and the relief you are getting from it. Do not go to sleep and attempt to complete it later. It will not be accurate. If you received sedation, it is likely that you were given a medication that may cause amnesia. Because of this, completing the  diary at a later time may cause the information to be inaccurate. This information is needed to plan your care. Follow-up appointment: Keep your post-procedure follow-up evaluation appointment after the procedure (usually 2 weeks for most procedures, 6 weeks for radiofrequencies). DO NOT FORGET to bring you pain diary with you.   Expect: (What should I expect to see with my procedure?) From numbing medicine (AKA: Local Anesthetics): Numbness or decrease in pain. You may also experience some weakness, which if present, could last for the duration of the local anesthetic. Onset: Full effect within 15 minutes of injected. Duration: It will depend on the type of local anesthetic used. On the average, 1 to 8 hours.  From steroids (Applies only if steroids were used): Decrease in swelling or inflammation. Once inflammation is improved, relief of the pain will follow. Onset of benefits: Depends on the amount of swelling present. The more swelling, the longer it will take for the benefits to be seen. In some cases, up to 10 days. Duration: Steroids will stay in the system x 2 weeks. Duration of benefits will depend on multiple posibilities including persistent irritating factors. Side-effects: If present, they may typically last 2 weeks (the duration of the steroids). Frequent: Cramps (if they occur, drink Gatorade and take over-the-counter Magnesium 450-500 mg once to twice a day); water retention with temporary weight gain; increases in blood sugar; decreased immune system response; increased appetite. Occasional: Facial flushing (red, warm  cheeks); mood swings; menstrual changes. Uncommon: Long-term decrease or suppression of natural hormones; bone thinning. (These are more common with higher doses or more frequent use. This is why we prefer that our patients avoid having any injection therapies in other practices.)  Very Rare: Severe mood changes; psychosis; aseptic necrosis. From procedure: Some  discomfort is to be expected once the numbing medicine wears off. This should be minimal if ice and heat are applied as instructed.  Call if: (When should I call?) You experience numbness and weakness that gets worse with time, as opposed to wearing off. New onset bowel or bladder incontinence. (Applies only to procedures done in the spine)  Emergency Numbers: Durning business hours (Monday - Thursday, 8:00 AM - 4:00 PM) (Friday, 9:00 AM - 12:00 Noon): [phone] After hours: [phone] NOTE: If you are having a problem and are unable connect with, or to talk to a provider, then go to your nearest urgent care or emergency department. If the problem is serious and urgent, please call 911. ____________________________________________________________________________________________  ____________________________________________________________________________________________  Patient Information update  To: All of our patients.  Re: Name change.  It has been made official that our current name, "[facility]"   will soon be changed "[facility]".   The purpose of this change is to eliminate any confusion created by the concept of our practice being a "Pain Clinic". In the past this has led to the misconception that we treat pain primarily by the use of prescription medications.  Nothing can be farther from the truth.   Understanding PAIN MANAGEMENT: To further understand what our practice does, you first have to understand that "Pain Management" is a subspecialty that requires additional training once a physician has completed their specialty training, which can be in either Anesthesia, Neurology, Psychiatry, or Physical Medicine and Rehabilitation (PMR). Each one of these contributes to the final approach taken by each physician to the management of their patient's pain. To be a  "Pain Management Specialist" you must have completed one of the specialty trainings below.  Anesthesiologists are trained in clinical pharmacology and interventional techniques such as nerve blockade and regional as well as central neuroanatomy. They are trained to block pain before, during, and after surgical interventions.  Neurologists are trained in the diagnosis and pharmacological treatment of complex neurological conditions, such as Multiple Sclerosis, Parkinson's, spinal cord injuries, and other systemic conditions that may be associated with symptoms that may include but are not limited to pain. They tend to rely primarily on the treatment of chronic pain using prescription medications.  Psychiatrist are trained in conditions affecting the psychosocial wellbeing of patients including but not limited to depression, anxiety, schizophrenia, personality disorders, addiction, and other substance use disorders that may be associated with chronic pain. They tend to rely primarily on the treatment of chronic pain using prescription medications.   Physical Medicine and Rehabilitation (PMR) physicians, also known as physiatrists, treat a wide variety of medical conditions affecting the brain, spinal cord, nerves, bones, joints, ligaments, muscles, and tendons. Their training is primarily aimed at treating patients that have suffered injuries that have caused severe physical impairment. They are trained in the physical therapy and rehabilitation of those patients. They may also work alongside orthopedic surgeons or neurosurgeons using their expertise in [place] patients to recover after their surgery.  INTERVENTIONAL PAIN MANAGEMENT is s sub-subspecialty of Pain Management.  Our physicians are Board-certified in Anesthesia, Pain  Management, and Interventional Pain Management.  This meaning that not only have they been trained and Board-certified in their specialty of Anesthesia, subspecialty of Pain  Management, but they have also received further training in the sub-subspecialty of Interventional Pain Management, in order to be Board-certified as INTERVENTIONAL PAIN MANAGEMENT SPECIALIST.    Mission: Our goal is to use INTERVENTIONAL PAIN MANAGEMENT techniques as an alternative option to the chronic use of prescription medication for the treatment of pain. To make this clear, we have changed our name and we will no longer be taking patients for medication management. We will continue to offer medication management assessment and recommendations, but we will not be taking over any patient's medication regimen.  ____________________________________________________________________________________________  Pain Management Discharge Instructions  General Discharge Instructions :  If you need to reach your doctor call: Monday-Friday 8:00 am - 4:00 pm at [phone] or toll free [phone].  After clinic hours [phone] to have operator reach doctor.  Bring all of your medication bottles to all your appointments in the pain clinic.  To cancel or reschedule your appointment with Pain Management please remember to call 24 hours in advance to avoid a fee.  Refer to the educational materials which you have been given on: General Risks, I had my Procedure. Discharge Instructions, Post Sedation.  Post Procedure Instructions:  The drugs you were given will stay in your system until tomorrow, so for the next 24 hours you should not drive, make any legal decisions or drink any alcoholic beverages.  You may eat anything you prefer, but it is better to start with liquids then soups and crackers, and gradually work up to solid foods.  Please notify your doctor immediately if you have any unusual bleeding, trouble breathing or pain that is not related to your normal pain.  Depending on the type of procedure that was done, some parts of your body may feel week and/or numb.  This usually clears up by  tonight or the next day.  Walk with the use of an assistive device or accompanied by an adult for the 24 hours.  You may use ice on the affected area for the first 24 hours.  Put ice in a Ziploc bag and cover with a towel and place against area 15 minutes on 15 minutes off.  You may switch to heat after 24 hours.

## 2021-11-25 NOTE — Progress Notes (Signed)
Safety precautions to be maintained throughout the outpatient stay will include: orient to surroundings, keep bed in low position, maintain call bell within reach at all times, provide assistance with transfer out of bed and ambulation.  

## 2021-11-25 NOTE — Progress Notes (Signed)
PROVIDER NOTE: Interpretation of information contained herein should be left to medically-trained personnel. Specific patient instructions are provided elsewhere under "Patient Instructions" section of medical record. This document was created in part using STT-dictation technology, any transcriptional errors that may result from this process are unintentional.  Patient: Holly Spears Type: Established DOB: September 27, 1970 MRN: [id number] PCP: Perrin Maltese, PA-C  Service: Procedure DOS: 11/25/2021 Setting: Ambulatory Location: Ambulatory outpatient facility Delivery: Face-to-face Provider: Oswaldo Done, MD Specialty: Interventional Pain Management Specialty designation: 09 Location: Outpatient facility Ref. Prov.: Delano Metz, MD    Primary Reason for Visit: Interventional Pain Management Treatment. CC: Back Pain (mid)  Procedure:           Type: Suprascapular nerve block (SSNB) #1  Laterality:  Right Level: Superior to scapular spine, lateral to supraspinatus fossa (Suprascapular notch).  Imaging: Fluoroscopic guidance         Anesthesia: Local anesthesia (1-2% Lidocaine) Anxiolysis: None                 Sedation: No Sedation                       DOS: 11/25/2021  Performed by: Oswaldo Done, MD  Purpose: Diagnostic/Therapeutic Indications: Shoulder pain, severe enough to impact quality of life and/or function. 1. Pain in shoulder region (Right)   2. Enthesopathy of shoulder (Right)   3. Trigger point of shoulder region (Right)   4. Chronic shoulder pain (5th area of Pain) (Bilateral) (R>L)   5. Spasm of back muscles   6. Myofascial pain   7. Muscle spasm    NAS-11 score:   Pre-procedure: 2 /10   Post-procedure: 2 /10     Target: Suprascapular nerve Location: midway between the medial border of the scapula and the acromion as it runs through the suprascapular notch. Region: Suprascapular, posterior shoulder  Approach: Percutaneous  Neuroanatomy: The  suprascapular nerve is the lateral branch of the superior trunk of the brachial plexus. It receives nerve fibers that originate in the nerve roots C5 and C6 (and sometimes C4). It is a mixed nerve, meaning that it provides both sensory and motor supply for the suprascapular region. Function: The main function of this nerve is to provide motor innervation for two muscles, the supraspinatus and infraspinatus muscles. They are part of the rotator cuff muscles. In addition, the suprascapular nerve provides a sensory supply to the joints of the scapula (glenohumeral and acromioclavicular joints). Rationale (medical necessity): procedure needed and proper for the diagnosis and/or treatment of the patient's medical symptoms and needs.  Position / Prep / Materials:  Position: Prone Materials:  Tray: Block Needle(s):  Type: Spinal  Gauge (G): 22  Length: 3.5 in.  Qty: 1 Prep solution: DuraPrep (Iodine Povacrylex [0.7% available iodine] and Isopropyl Alcohol, 74% w/w) Prep Area: Entire posterior shoulder area. From upper spine to shoulder proper (upper arm), and from lateral neck to lower tip of shoulder blade.   Pre-op H&P Assessment:  Ms. Garzon is a 51 y.o. (year old), female patient, seen today for interventional treatment. She  has a past surgical history that includes Cholecystectomy and Abdominal hysterectomy. Ms. Marker has a current medication list which includes the following prescription(s): biotin, bupropion, calcium carbonate-vitamin d, vitamin d3, citalopram, cyclobenzaprine, epinephrine, famotidine, hydroxychloroquine, ibuprofen, levothyroxine, magnesium, multi-vitamins, oxycodone hcl, [START ON 12/19/2021] oxycodone hcl, potassium chloride sa, prevident 5000 booster plus, zinc, naloxone, and oxycodone hcl, and the following Facility-Administered Medications: lactated ringers, midazolam, and pentafluoroprop-tetrafluoroeth. Her  primarily concern today is the Back Pain (mid)  Initial Vital  Signs:  Pulse/HCG Rate: (!) 102ECG Heart Rate: (!) 106 Temp: (!) 97 F (36.1 C) Resp: 18 BP: (!) 147/95 SpO2: 100 %  BMI: Estimated body mass index is 26.63 kg/m as calculated from the following:   Height as of this encounter: 5\' 6"  (1.676 m).   Weight as of this encounter: 165 lb (74.8 kg).  Risk Assessment: Allergies: Reviewed. She is allergic to bee venom, penicillins, shellfish allergy, sulfa antibiotics, erythromycin, zaleplon, codeine, and trazodone.  Allergy Precautions: None required Coagulopathies: Reviewed. None identified.  Blood-thinner therapy: None at this time Active Infection(s): Reviewed. None identified. Ms. Layne is afebrile  Site Confirmation: Ms. Utke was asked to confirm the procedure and laterality before marking the site Procedure checklist: Completed Consent: Before the procedure and under the influence of no sedative(s), amnesic(s), or anxiolytics, the patient was informed of the treatment options, risks and possible complications. To fulfill our ethical and legal obligations, as recommended by the American Medical Association's Code of Ethics, I have informed the patient of my clinical impression; the nature and purpose of the treatment or procedure; the risks, benefits, and possible complications of the intervention; the alternatives, including doing nothing; the risk(s) and benefit(s) of the alternative treatment(s) or procedure(s); and the risk(s) and benefit(s) of doing nothing. The patient was provided information about the general risks and possible complications associated with the procedure. These may include, but are not limited to: failure to achieve desired goals, infection, bleeding, organ or nerve damage, allergic reactions, paralysis, and death. In addition, the patient was informed of those risks and complications associated to the procedure, such as failure to decrease pain; infection; bleeding; organ or nerve damage with subsequent damage to  sensory, motor, and/or autonomic systems, resulting in permanent pain, numbness, and/or weakness of one or several areas of the body; allergic reactions; (i.e.: anaphylactic reaction); and/or death. Furthermore, the patient was informed of those risks and complications associated with the medications. These include, but are not limited to: allergic reactions (i.e.: anaphylactic or anaphylactoid reaction(s)); adrenal axis suppression; blood sugar elevation that in diabetics may result in ketoacidosis or comma; water retention that in patients with history of congestive heart failure may result in shortness of breath, pulmonary edema, and decompensation with resultant heart failure; weight gain; swelling or edema; medication-induced neural toxicity; particulate matter embolism and blood vessel occlusion with resultant organ, and/or nervous system infarction; and/or aseptic necrosis of one or more joints. Finally, the patient was informed that Medicine is not an exact science; therefore, there is also the possibility of unforeseen or unpredictable risks and/or possible complications that may result in a catastrophic outcome. The patient indicated having understood very clearly. We have given the patient no guarantees and we have made no promises. Enough time was given to the patient to ask questions, all of which were answered to the patient's satisfaction. Ms. Galeana has indicated that she wanted to continue with the procedure. Attestation: I, the ordering provider, attest that I have discussed with the patient the benefits, risks, side-effects, alternatives, likelihood of achieving goals, and potential problems during recovery for the procedure that I have provided informed consent. Date  Time: 11/25/2021 11:16 AM  Pre-Procedure Preparation:  Monitoring: As per clinic protocol. Respiration, ETCO2, SpO2, BP, heart rate and rhythm monitor placed and checked for adequate function Safety Precautions: Patient was  assessed for positional comfort and pressure points before starting the procedure. Time-out: I initiated and conducted the "  Time-out" before starting the procedure, as per protocol. The patient was asked to participate by confirming the accuracy of the "Time Out" information. Verification of the correct person, site, and procedure were performed and confirmed by me, the nursing staff, and the patient. "Time-out" conducted as per Joint Commission's Universal Protocol (UP.01.01.01). Time: 1156  Description of Procedure:          Procedural Technique Safety Precautions: Aspiration looking for blood return was conducted prior to all injections. At no point did we inject any substances, as a needle was being advanced. No attempts were made at seeking any paresthesias. Safe injection practices and needle disposal techniques used. Medications properly checked for expiration dates. SDV (single dose vial) medications used. Description of the Procedure: Protocol guidelines were followed. The patient was placed in position over the procedure table. The target area was identified and the area prepped in the usual manner. Skin & deeper tissues infiltrated with local anesthetic. Appropriate amount of time allowed to pass for local anesthetics to take effect. The procedure needles were then advanced to the target area. Proper needle placement secured. Negative aspiration confirmed. Solution injected in intermittent fashion, asking for systemic symptoms every 0.5cc of injectate. The needles were then removed and the area cleansed, making sure to leave some of the prepping solution back to take advantage of its long term bactericidal properties.  Vitals:   11/25/21 1115 11/25/21 1116 11/25/21 1155 11/25/21 1159  BP:  (!) 147/95 (!) 139/106 (!) 156/107  Pulse:  (!) 102    Resp:   18 18  Temp: (!) 97 F (36.1 C)     SpO2:  100% 100% 100%  Weight: 165 lb (74.8 kg)     Height: 5\' 6"  (1.676 m)        Start Time: 1156  hrs. End Time: 1159 hrs.  Imaging Guidance (Spinal):          Type of Imaging Technique: Fluoroscopy Guidance (Spinal) Indication(s): Assistance in needle guidance and placement for procedures requiring needle placement in or near specific anatomical locations not easily accessible without such assistance. Exposure Time: Please see nurses notes. Contrast: None used. Fluoroscopic Guidance: I was personally present during the use of fluoroscopy. "Tunnel Vision Technique" used to obtain the best possible view of the target area. Parallax error corrected before commencing the procedure. "Direction-depth-direction" technique used to introduce the needle under continuous pulsed fluoroscopy. Once target was reached, antero-posterior, oblique, and lateral fluoroscopic projection used confirm needle placement in all planes. Images permanently stored in EMR. Interpretation: No contrast injected. I personally interpreted the imaging intraoperatively. Adequate needle placement confirmed in multiple planes. Permanent images saved into the patient's record.  Antibiotic Prophylaxis:   Anti-infectives (From admission, onward)    None      Indication(s): None identified  Post-operative Assessment:  Post-procedure Vital Signs:  Pulse/HCG Rate: (!) 102(!) 103 Temp: (!) 97 F (36.1 C) Resp: 18 BP: (!) 156/107 SpO2: 100 %  EBL: None  Complications: No immediate post-treatment complications observed by team, or reported by patient.  Note: The patient tolerated the entire procedure well. A repeat set of vitals were taken after the procedure and the patient was kept under observation following institutional policy, for this type of procedure. Post-procedural neurological assessment was performed, showing return to baseline, prior to discharge. The patient was provided with post-procedure discharge instructions, including a section on how to identify potential problems. Should any problems arise concerning  this procedure, the patient was given instructions to immediately contact  us, at any time, without hesitation. In any case, we plan to contact the patient by telephone for a follow-up status report regarding this interventional procedure.  Comments:  No additional relevant information.  Plan of Care  Orders:  Orders Placed This Encounter  Procedures   SUPRASCAPULAR NERVE BLOCK    For shoulder pain.    Scheduling Instructions:     Purpose: Diagnostic     Laterality: Right-sided     Level(s): Suprascapular notch     Sedation: Patient's choice.     Scheduling Timeframe: Today    Order Specific Question:   Where will this procedure be performed?    Answer:   [facility]   DG [facility] C-ARM 1-60 MIN NO REPORT    Intraoperative interpretation by procedural physician at [facility].    Standing Status:   Standing    Number of Occurrences:   1    Order Specific Question:   Reason for exam:    Answer:   Assistance in needle guidance and placement for procedures requiring needle placement in or near specific anatomical locations not easily accessible without such assistance.   Informed Consent Details: Physician/Practitioner Attestation; Transcribe to consent form and obtain patient signature    Note: Always confirm laterality of pain with Ms. Bazin, before procedure.    Order Specific Question:   Physician/Practitioner attestation of informed consent for procedure/surgical case    Answer:   I, the physician/practitioner, attest that I have discussed with the patient the benefits, risks, side effects, alternatives, likelihood of achieving goals and potential problems during recovery for the procedure that I have provided informed consent.    Order Specific Question:   Procedure    Answer:   Suprascapular Nerve Block    Order Specific Question:   Physician/Practitioner performing the procedure    Answer:   Verbon Giangregorio A. Laban Emperor, MD    Order Specific Question:    Indication/Reason    Answer:   Chronic shoulder pain (arthralgia)   Provide equipment / supplies at bedside    "Block Tray" (Disposable  single use) Needle type: SpinalSpinal Amount/quantity: 1 Size: Regular (3.5-inch) Gauge: 22G    Standing Status:   Standing    Number of Occurrences:   1    Order Specific Question:   Specify    Answer:   Block Tray   Chronic Opioid Analgesic:  Oxycodone IR 10 mg, 1 tab PO q 6 hrs (40 mg/day of oxycodone) MME/day: 60 mg/day.   Medications ordered for procedure: Meds ordered this encounter  Medications   lidocaine (XYLOCAINE) 2 % (with pres) injection 400 mg   pentafluoroprop-tetrafluoroeth (GEBAUERS) aerosol   lactated ringers infusion   midazolam (VERSED) 5 MG/5ML injection 0.5-2 mg    Make sure Flumazenil is available in the pyxis when using this medication. If oversedation occurs, administer 0.2 mg IV over 15 sec. If after 45 sec no response, administer 0.2 mg again over 1 min; may repeat at 1 min intervals; not to exceed 4 doses (1 mg)   ropivacaine (PF) 2 mg/mL (0.2%) (NAROPIN) injection 4 mL   triamcinolone acetonide (KENALOG-40) injection 40 mg   Medications administered: We administered lidocaine, ropivacaine (PF) 2 mg/mL (0.2%), and triamcinolone acetonide.  See the medical record for exact dosing, route, and time of administration.  Follow-up plan:   Return in about 2 weeks (around 12/09/2021) for Proc-day (T,Th), (VV), (PPE).       Interventional Therapies  Risk  Complexity Considerations:   Estimated  body mass index is 27.44 kg/m as calculated from the following:   Height as of this encounter: 5\' 6"  (1.676 m).   Weight as of this encounter: 170 lb (77.1 kg). WNL   Planned  Pending:   The patient had a bad experience with another physician receiving blocks.   Under consideration:   Diagnostic bilateral lumbar facet block  Possible bilateral lumbar facet RFA  Diagnostic right L5-S1 LESI  Possible right L5 vs S1 TFESI   Diagnostic bilateral IA knee injections (w/ steroid)  Possible series of 5 IA Hyalgan knee injections  Diagnostic bilateral Genicular NB  Possible bilateral Genicular nerve RFA    Completed:   None.   Therapeutic  Palliative (PRN) options:   None.        Recent Visits Date Type Provider Dept  11/06/21 Office Visit 01/06/22, MD [facility]  10/23/21 Procedure visit 10/25/21, MD [facility]  10/13/21 Office Visit 12/13/21, MD [facility]  Showing recent visits within past 90 days and meeting all other requirements Today's Visits Date Type Provider Dept  11/25/21 Procedure visit 11/27/21, MD [facility]  Showing today's visits and meeting all other requirements Future Appointments Date Type Provider Dept  12/18/21 Appointment 12/20/21, MD [facility]  01/14/22 Appointment 01/16/22, MD [facility]  Showing future appointments within next 90 days and meeting all other requirements  Disposition: Discharge home  Discharge (Date  Time): 11/25/2021; 1210 hrs.   Primary Care Physician: 11/27/2021, PA-C Location: [facility] Note by: [facility], MD Date: 11/25/2021; Time: 1:18 PM  Disclaimer:  Medicine is not an 11/27/2021. The only guarantee in medicine is that nothing is guaranteed. It is important to note that the decision to proceed with this intervention was based on the information collected from the patient. The Data and conclusions were drawn from the patient's questionnaire, the interview, and the physical examination. Because the information was provided in large part by the patient, it cannot be guaranteed that it has not been purposely or unconsciously manipulated. Every effort has been made to obtain as much relevant data as possible for this evaluation. It is important to note that the conclusions that  lead to this procedure are derived in large part from the available data. Always take into account that the treatment will also be dependent on availability of resources and existing treatment guidelines, considered by other Pain Management Practitioners as being common knowledge and practice, at the time of the intervention. For Medico-Legal purposes, it is also important to point out that variation in procedural techniques and pharmacological choices are the acceptable norm. The indications, contraindications, technique, and results of the above procedure should only be interpreted and judged by a Board-Certified Interventional Pain Specialist with extensive familiarity and expertise in the same exact procedure and technique.

## 2021-11-26 ENCOUNTER — Telehealth: Payer: Self-pay

## 2021-11-26 NOTE — Telephone Encounter (Signed)
Post procedure phone call. Patient states she is doing good.  

## 2021-12-17 ENCOUNTER — Telehealth: Payer: Self-pay

## 2021-12-17 NOTE — Telephone Encounter (Signed)
Attempted to call patient for pre virtua appointment questions.  No answer and no answering machine.

## 2021-12-18 ENCOUNTER — Telehealth: Payer: Self-pay

## 2021-12-18 ENCOUNTER — Ambulatory Visit: Payer: 59 | Attending: Pain Medicine | Admitting: Pain Medicine

## 2021-12-18 DIAGNOSIS — M542 Cervicalgia: Secondary | ICD-10-CM | POA: Diagnosis not present

## 2021-12-18 DIAGNOSIS — G8929 Other chronic pain: Secondary | ICD-10-CM

## 2021-12-18 DIAGNOSIS — M25512 Pain in left shoulder: Secondary | ICD-10-CM | POA: Diagnosis not present

## 2021-12-18 DIAGNOSIS — M25511 Pain in right shoulder: Secondary | ICD-10-CM

## 2021-12-18 NOTE — Telephone Encounter (Signed)
Attempted to call patient for the second time.  There is no answer and no answering machine.

## 2021-12-18 NOTE — Patient Instructions (Signed)
______________________________________________________________________  Preparing for your procedure  During your procedure appointment there will be: No Prescription Refills. No disability issues to discussed. No medication changes or discussions.  Instructions: Food intake: Avoid eating anything solid for at least 8 hours prior to your procedure. Clear liquid intake: You may take clear liquids such as water up to 2 hours prior to your procedure. (No carbonated drinks. No soda.) Transportation: Unless otherwise stated by your physician, bring a driver. Morning Medicines: Except for blood thinners, take all of your other morning medications with a sip of water. Make sure to take your heart and blood pressure medicines. If your blood pressure's lower number is above 100, the case will be rescheduled. Blood thinners: If you take a blood thinner, but were not instructed to stop it, call our office [phone] and ask to talk to a nurse. Not stopping a blood thinner prior to certain procedures could lead to serious complications. Diabetics on insulin: Notify the staff so that you can be scheduled 1st case in the morning. If your diabetes requires high dose insulin, take only  of your normal insulin dose the morning of the procedure and notify the staff that you have done so. Preventing infections: Shower with an antibacterial soap the morning of your procedure.  Build-up your immune system: Take 1000 mg of Vitamin C with every meal (3 times a day) the day prior to your procedure. Antibiotics: Inform the nursing staff if you are taking any antibiotics or if you have any conditions that may require antibiotics prior to procedures. (Example: recent joint implants)   Pregnancy: If you are pregnant make sure to notify the nursing staff. Not doing so may result in injury to the fetus, including death.  Sickness: If you have a cold, fever, or any active infections, call and cancel or reschedule your  procedure. Receiving steroids while having an infection may result in complications. Arrival: You must be in the facility at least 30 minutes prior to your scheduled procedure. Tardiness: Your scheduled time is also the cutoff time. If you do not arrive at least 15 minutes prior to your procedure, you will be rescheduled.  Children: Do not bring any children with you. Make arrangements to keep them home. Dress appropriately: There is always a possibility that your clothing may get soiled. Avoid long dresses. Valuables: Do not bring any jewelry or valuables.  Reasons to call and reschedule or cancel your procedure: (Following these recommendations will minimize the risk of a serious complication.) Surgeries: Avoid having procedures within 2 weeks of any surgery. (Avoid for 2 weeks before or after any surgery). Flu Shots: Avoid having procedures within 2 weeks of a flu shots or . (Avoid for 2 weeks before or after immunizations). Barium: Avoid having a procedure within 7-10 days after having had a radiological study involving the use of radiological contrast. (Myelograms, Barium swallow or enema study). Heart attacks: Avoid any elective procedures or surgeries for the initial 6 months after a "Myocardial Infarction" (Heart Attack). Blood thinners: It is imperative that you stop these medications before procedures. Let us know if you if you take any blood thinner.  Infection: Avoid procedures during or within two weeks of an infection (including chest colds or gastrointestinal problems). Symptoms associated with infections include: Localized redness, fever, chills, night sweats or profuse sweating, burning sensation when voiding, cough, congestion, stuffiness, runny nose, sore throat, diarrhea, nausea, vomiting, cold or Flu symptoms, recent or current infections. It is specially important if the infection is   over the area that we intend to treat. Heart and lung problems: Symptoms that may suggest an  active cardiopulmonary problem include: cough, chest pain, breathing difficulties or shortness of breath, dizziness, ankle swelling, uncontrolled high or unusually low blood pressure, and/or palpitations. If you are experiencing any of these symptoms, cancel your procedure and contact your primary care physician for an evaluation.  Remember:  Regular Business hours are:  Monday to Thursday 8:00 AM to 4:00 PM  Provider's Schedule: Lalani Winkles, MD:  Procedure days: Tuesday and Thursday 7:30 AM to 4:00 PM  Bilal Lateef, MD:  Procedure days: Monday and Wednesday 7:30 AM to 4:00 PM  ______________________________________________________________________    ____________________________________________________________________________________________  General Risks and Possible Complications  Patient Responsibilities: It is important that you read this as it is part of your informed consent. It is our duty to inform you of the risks and possible complications associated with treatments offered to you. It is your responsibility as a patient to read this and to ask questions about anything that is not clear or that you believe was not covered in this document.  Patient's Rights: You have the right to refuse treatment. You also have the right to change your mind, even after initially having agreed to have the treatment done. However, under this last option, if you wait until the last second to change your mind, you may be charged for the materials used up to that point.  Introduction: Medicine is not an exact science. Everything in Medicine, including the lack of treatment(s), carries the potential for danger, harm, or loss (which is by definition: Risk). In Medicine, a complication is a secondary problem, condition, or disease that can aggravate an already existing one. All treatments carry the risk of possible complications. The fact that a side effects or complications occurs, does not imply  that the treatment was conducted incorrectly. It must be clearly understood that these can happen even when everything is done following the highest safety standards.  No treatment: You can choose not to proceed with the proposed treatment alternative. The "PRO(s)" would include: avoiding the risk of complications associated with the therapy. The "CON(s)" would include: not getting any of the treatment benefits. These benefits fall under one of three categories: diagnostic; therapeutic; and/or palliative. Diagnostic benefits include: getting information which can ultimately lead to improvement of the disease or symptom(s). Therapeutic benefits are those associated with the successful treatment of the disease. Finally, palliative benefits are those related to the decrease of the primary symptoms, without necessarily curing the condition (example: decreasing the pain from a flare-up of a chronic condition, such as incurable terminal cancer).  General Risks and Complications: These are associated to most interventional treatments. They can occur alone, or in combination. They fall under one of the following six (6) categories: no benefit or worsening of symptoms; bleeding; infection; nerve damage; allergic reactions; and/or death. No benefits or worsening of symptoms: In Medicine there are no guarantees, only probabilities. No healthcare provider can ever guarantee that a medical treatment will work, they can only state the probability that it may. Furthermore, there is always the possibility that the condition may worsen, either directly, or indirectly, as a consequence of the treatment. Bleeding: This is more common if the patient is taking a blood thinner, either prescription or over the counter (example: Goody Powders, Fish oil, Aspirin, Garlic, etc.), or if suffering a condition associated with impaired coagulation (example: Hemophilia, cirrhosis of the liver, low platelet counts, etc.). However, even if   you  do not have one on these, it can still happen. If you have any of these conditions, or take one of these drugs, make sure to notify your treating physician. Infection: This is more common in patients with a compromised immune system, either due to disease (example: diabetes, cancer, human immunodeficiency virus [HIV], etc.), or due to medications or treatments (example: therapies used to treat cancer and rheumatological diseases). However, even if you do not have one on these, it can still happen. If you have any of these conditions, or take one of these drugs, make sure to notify your treating physician. Nerve Damage: This is more common when the treatment is an invasive one, but it can also happen with the use of medications, such as those used in the treatment of cancer. The damage can occur to small secondary nerves, or to large primary ones, such as those in the spinal cord and brain. This damage may be temporary or permanent and it may lead to impairments that can range from temporary numbness to permanent paralysis and/or brain death. Allergic Reactions: Any time a substance or material comes in contact with our body, there is the possibility of an allergic reaction. These can range from a mild skin rash (contact dermatitis) to a severe systemic reaction (anaphylactic reaction), which can result in death. Death: In general, any medical intervention can result in death, most of the time due to an unforeseen complication. ____________________________________________________________________________________________    

## 2021-12-18 NOTE — Progress Notes (Addendum)
Patient: Holly Spears  Service Category: E/M  Provider: Gaspar Cola, MD  DOB: 28-Apr-1970  DOS: 12/18/2021  Location: Office  MRN: [id number]  Setting: Ambulatory outpatient  Referring Provider: Lonzo Cloud, PA-C  Type: Established Patient  Specialty: Interventional Pain Management  PCP: Lonzo Cloud, PA-C  Location: Remote location  Delivery: TeleHealth     Virtual Encounter - Pain Management PROVIDER NOTE: Information contained herein reflects review and annotations entered in association with encounter. Interpretation of such information and data should be left to medically-trained personnel. Information provided to patient can be located elsewhere in the medical record under "Patient Instructions". Document created using STT-dictation technology, any transcriptional errors that may result from process are unintentional.    Contact & Pharmacy Preferred: [phone] Home: [phone] (home) Mobile: [phone] (mobile) E-mail: No e-mail address on record  [place], [place] [address] # [place] [address] Phone: [phone] Fax: [phone]   Pre-screening  Ms. Canterbury offered "in-person" vs "virtual" encounter. She indicated preferring virtual for this encounter.   Reason COVID-19*  Social distancing based on CDC and AMA recommendations.   I contacted Victorine Carl on 12/18/2021 via telephone.      I clearly identified myself as Gaspar Cola, MD. I verified that I was speaking with the correct person using two identifiers (Name: Margherita Collyer, and date of birth: 04-29-1970).  Consent I sought verbal advanced consent from [facility] for virtual visit interactions. I informed Ms. Gemma of possible security and privacy concerns, risks, and limitations associated with providing "not-in-person" medical evaluation and management services. I also informed Ms. Hemenway of the availability of "in-person" appointments.  Finally, I informed her that there would be a charge for the virtual visit and that she could be  personally, fully or partially, financially responsible for it. Ms. Pavone expressed understanding and agreed to proceed.   Historic Elements   Ms. Caci Orren is a 51 y.o. year old, female patient evaluated today after our last contact on 11/25/2021. Ms. 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 She also  has a past surgical history that includes Cholecystectomy and Abdominal hysterectomy. Ms. Axley has a current medication list which includes the following prescription(s): biotin, bupropion, calcium carbonate-vitamin d, vitamin d3, citalopram, cyclobenzaprine, epinephrine, famotidine, hydroxychloroquine, ibuprofen, levothyroxine, magnesium, multi-vitamins, naloxone, oxycodone hcl, oxycodone hcl, oxycodone hcl, potassium chloride sa, prevident 5000 booster plus, and zinc. She  reports that she has quit smoking. She has never used smokeless tobacco. She reports that she does not drink alcohol and does not use drugs. Ms. Buonomo is allergic to bee venom, penicillins, shellfish allergy, sulfa antibiotics, erythromycin, zaleplon, codeine, and trazodone.   HPI  Today, she is being contacted for a post-procedure assessment.  The patient indicates having attained 100% relief of the pain for the duration of the local anesthetic which then dwindled down to an 80% that seem to have persisted for over 2 weeks.  The pain is now beginning to return.  Today we talked about several options including radiofrequency ablation of the suprascapular nerve.  I have informed the patient of  all the risk and possible complications including that of neuritis.  I have explained to the  patient the process and I have collected feedback from the diagnostic procedure.  The patient indicated not having experienced any significant side effects or adverse reactions from the procedure.  She has indicated being interested in the radiofrequency ablation and therefore today we will go ahead and enter an order to proceed with that.  Hopefully, this will provide the patient with longer lasting benefit.  Post-procedure evaluation   Type: Suprascapular nerve block (SSNB) #1  Laterality:  Right Level: Superior to scapular spine, lateral to supraspinatus fossa (Suprascapular notch).  Imaging: Fluoroscopic guidance         Anesthesia: Local anesthesia (1-2% Lidocaine) Anxiolysis: None                 Sedation: No Sedation                       DOS: 11/25/2021  Performed by: Gaspar Cola, MD  Purpose: Diagnostic/Therapeutic Indications: Shoulder pain, severe enough to impact quality of life and/or function. 1. Pain in shoulder region (Right)   2. Enthesopathy of shoulder (Right)   3. Trigger point of shoulder region (Right)   4. Chronic shoulder pain (5th area of Pain) (Bilateral) (R>L)   5. Spasm of back muscles   6. Myofascial pain   7. Muscle spasm    NAS-11 score:   Pre-procedure: 2 /10   Post-procedure: 2 /10      Effectiveness:  Initial hour after procedure:  100%. Subsequent 4-6 hours post-procedure:  80%. Analgesia past initial 6 hours:  80%. Ongoing improvement:  Analgesic: The patient indicates having attained an ongoing 80% relief of the shoulder pain, which is beginning to wear off. Function: Ms. Dunkleberger reports improvement in function ROM: Ms. Infantino reports improvement in ROM  Pharmacotherapy Assessment   Opioid Analgesic: Oxycodone IR 10 mg, 1 tab PO q 6 hrs (40 mg/day of oxycodone) MME/day: 60 mg/day.   Monitoring: [place] PMP: PDMP reviewed during this encounter.        Pharmacotherapy: No side-effects or adverse reactions reported. Compliance: No problems identified. Effectiveness: Clinically acceptable. Plan: Refer to "POC". UDS:  Summary  Date Value Ref Range Status  07/14/2021 Note  Final    Comment:    ==================================================================== [employer] Select 13 (MW) ==================================================================== Test                             Result       Flag       Units  Drug Present and Declared for Prescription Verification   Oxycodone                      749          EXPECTED   ng/mg creat   Oxymorphone                    2496         EXPECTED   ng/mg creat   Noroxycodone                   6964         EXPECTED   ng/mg creat   Noroxymorphone                 1100         EXPECTED   ng/mg creat    Sources of oxycodone are scheduled prescription medications.    Oxymorphone, noroxycodone,  and noroxymorphone are expected    metabolites of oxycodone. Oxymorphone is also available as a    scheduled prescription medication.  ==================================================================== Test                      Result    Flag   Units      Ref Range   Creatinine              47               mg/dL      >=20 ==================================================================== Declared Medications:  The flagging and interpretation on this report are based on the  following declared medications.  Unexpected results may arise from  inaccuracies in the declared medications.   **Note: The testing scope of this panel includes these medications:   Oxycodone   **Note: The testing scope of this panel does not include the  following reported medications:   Biotin  Bupropion (Wellbutrin)  Calcium  Citalopram (Celexa)  Cyclobenzaprine (Flexeril)  Epinephrine (EpiPen)  Famotidine (Pepcid)  Fluoride (Prevident)  Hydroxychloroquine (Plaquenil)  Ibuprofen (Advil)  Levonorgestrel  (Mirena)  Levothyroxine (Synthroid)  Magnesium  Multivitamin  Naloxone (Narcan)  Potassium (Klor-Con)  Propranolol (Inderal)  Vitamin D  Vitamin D3  Zinc ==================================================================== For clinical consultation, please call [phone]. ====================================================================    No results found for: "CBDTHCR", "D8THCCBX", "D9THCCBX"   Laboratory Chemistry Profile   Renal Lab Results  Component Value Date   BUN 6 03/03/2018   CREATININE 0.63 03/03/2018   BCR 10 03/03/2018   GFRAA 123 03/03/2018   GFRNONAA 106 03/03/2018    Hepatic Lab Results  Component Value Date   AST 21 03/03/2018   ALT 26 03/27/2015   ALBUMIN 4.6 03/03/2018   ALKPHOS 71 03/03/2018    Electrolytes Lab Results  Component Value Date   NA 136 03/03/2018   K 3.9 03/03/2018   CL 97 03/03/2018   CALCIUM 8.9 03/03/2018   MG 2.1 03/03/2018    Bone Lab Results  Component Value Date   VD25OH 29.3 (L) 12/31/2014    Inflammation (CRP: Acute Phase) (ESR: Chronic Phase) Lab Results  Component Value Date   CRP <1 03/03/2018   ESRSEDRATE 2 03/03/2018         Note: Above Lab results reviewed.  Imaging  DG PAIN CLINIC C-ARM 1-60 MIN NO REPORT Fluoro was used, but no Radiologist interpretation will be provided.  Please refer to "NOTES" tab for provider progress note.  Assessment  The primary encounter diagnosis was Pain in shoulder region (Right). Diagnoses of Chronic shoulder pain (5th area of Pain) (Bilateral) (R>L) and Chronic neck pain (4th area of Pain) were also pertinent to this visit.  Plan of Care  Problem-specific:  No problem-specific Assessment & Plan notes found for this encounter.  Ms. Rajanae Mantia has a current medication list which includes the following long-term medication(s): bupropion, calcium carbonate-vitamin d, citalopram, cyclobenzaprine, famotidine, levothyroxine, naloxone, oxycodone hcl, oxycodone hcl,  and oxycodone hcl.  Pharmacotherapy (Medications Ordered): No orders of the defined types were placed in this encounter.  Orders:  Orders Placed This Encounter  Procedures   Radiofrequency Shoulder Joint    For shoulder pain.    Standing Status:   Future    Standing Expiration Date:   03/20/2022    Scheduling Instructions:     Procedure: Suprascapular Nerve Radiofrequency Ablation     Laterality: Right-sided     Level(s): Suprascapular nerve     Sedation: Patient's  choice     Scheduling Timeframe: As soon as pre-approved    Order Specific Question:   Where will this procedure be performed?    Answer:   [facility]   Follow-up plan:   Return for [facility]): (R) suprascapular nerve RFA #1.     Interventional Therapies  Risk  Complexity Considerations:   Estimated body mass index is 27.44 kg/m as calculated from the following:   Height as of this encounter: _0  (1.676 m).   Weight as of this encounter: 170 lb (77.1 kg). WNL   Planned  Pending:   The patient had a bad experience with another physician receiving blocks.   Under consideration:   Diagnostic bilateral lumbar facet block  Possible bilateral lumbar facet RFA  Diagnostic right L5-S1 LESI  Possible right L5 vs S1 TFESI  Diagnostic bilateral IA knee injections (w/ steroid)  Possible series of 5 IA Hyalgan knee injections  Diagnostic bilateral Genicular NB  Possible bilateral Genicular nerve RFA    Completed:   None.   Therapeutic  Palliative (PRN) options:   None.         Recent Visits Date Type Provider Dept  12/18/21 Office Visit Milinda Pointer, MD [facility]  11/25/21 Procedure visit Milinda Pointer, MD [facility]  11/06/21 Office Visit Milinda Pointer, MD [facility]  10/23/21 Procedure visit Milinda Pointer, MD [facility]  10/13/21 Office Visit Milinda Pointer, MD [facility]  Showing recent visits within past 90 days  and meeting all other requirements Today's Visits Date Type Provider Dept  01/01/22 Appointment Milinda Pointer, MD [facility]  Showing today's visits and meeting all other requirements Future Appointments Date Type Provider Dept  01/14/22 Appointment Milinda Pointer, MD [facility]  Showing future appointments within next 90 days and meeting all other requirements  I discussed the assessment and treatment plan with the patient. The patient was provided an opportunity to ask questions and all were answered. The patient agreed with the plan and demonstrated an understanding of the instructions.  Patient advised to call back or seek an in-person evaluation if the symptoms or condition worsens.  Duration of encounter: 23 minutes.  Note by: Gaspar Cola, MD Date: 12/18/2021; Time: 7:20 AM

## 2021-12-27 NOTE — Progress Notes (Signed)
PROVIDER NOTE: Interpretation of information contained herein should be left to medically-trained personnel. Specific patient instructions are provided elsewhere under "Patient Instructions" section of medical record. This document was created in part using STT-dictation technology, any transcriptional errors that may result from this process are unintentional.  Patient: Holly Spears Type: Established DOB: 1970-06-05 MRN: [id number] PCP: Lonzo Cloud, PA-C  Service: Procedure DOS: 01/01/2022 Setting: Ambulatory Location: Ambulatory outpatient facility Delivery: Face-to-face Provider: Gaspar Cola, MD Specialty: Interventional Pain Management Specialty designation: 09 Location: Outpatient facility Ref. Prov.: Dema Severin Wallace Keller, PA-C    Primary Reason for Visit: Interventional Pain Management Treatment. CC: Back Pain (mid) and Shoulder Pain (right)   Procedure:           Type: Suprascapular nerve Radiofrequency Ablation (RFA) #1  Laterality: Right  Level: Superior to the scapular spine, in the lateral aspect of the supraspinatus fossa (Suprescapular notch).  Imaging: Fluoroscopic guidance Anesthesia: Local anesthesia (1-2% Lidocaine) Anxiolysis: None                Sedation: None.  DOS: 01/01/2022  Performed by: Gaspar Cola, MD  Purpose: Therapeutic Indications: Shoulder pain severe enough to impact quality of life or function. Indications: 1. Chronic shoulder pain (5th area of Pain) (Bilateral) (R>L)   2. Arthropathy of shoulder (Right)    Holly Spears has been dealing with the above chronic pain for longer than three months and has either failed to respond, was unable to tolerate, or simply did not get enough benefit from other more conservative therapies including, but not limited to: 1. Over-the-counter medications 2. Anti-inflammatory medications 3. Muscle relaxants 4. Membrane stabilizers 5. Opioids 6. Physical therapy and/or chiropractic manipulation 7.  Modalities (Heat, ice, etc.) 8. Invasive techniques such as nerve blocks. Holly Spears has attained more than 50% relief of the pain from a series of diagnostic injections conducted in separate occasions.  Pain Score: Pre-procedure: 2 /10 Post-procedure: 0-No pain/10    Position / Prep / Materials:  Position: Prone  Prep solution: DuraPrep (Iodine Povacrylex [0.7% available iodine] and Isopropyl Alcohol, 74% w/w) Prep Area: Entire shoulder area.  This includes the lateral and posterior aspect of the neck, lateral and posterior aspect of the upper third of the upper arm, from the axilla to the spine in the upper back, down to the lower border of the scapula. Materials:  Tray:  RFA (Radiofrequency) tray Needle(s):  Type: RFA (Teflon-coated radiofrequency ablation needles)  Gauge (G): 22  Length: Regular (10cm) Qty: 1  Pre-op H&P Assessment:  Holly Spears is a 51 y.o. (year old), female patient, seen today for interventional treatment. She  has a past surgical history that includes Cholecystectomy and Abdominal hysterectomy. [name] has a current medication list which includes the following prescription(s): biotin, calcium carbonate-vitamin d, vitamin d3, citalopram, epinephrine, famotidine, ibuprofen, levothyroxine, magnesium, multi-vitamins, naloxone, nifedipine, potassium chloride sa, prevident 5000 booster plus, zinc, bupropion, cyclobenzaprine, hydroxychloroquine, [START ON 01/18/2022] oxycodone hcl, [START ON 02/17/2022] oxycodone hcl, and [START ON 03/19/2022] oxycodone hcl. Her primarily concern today is the Back Pain (mid) and Shoulder Pain (right)  Initial Vital Signs:  Pulse/HCG Rate: (!) 106  Temp: (!) 97.2 F (36.2 C) Resp: 16 BP: (!) 142/99 SpO2: 100 %  BMI: Estimated body mass index is 26.63 kg/m as calculated from the following:   Height as of this encounter: 5\' 6"  (1.676 m).   Weight as of this encounter: 165 lb (74.8 kg).  Risk Assessment: Allergies: Reviewed. She is  allergic to bee  venom, penicillins, shellfish allergy, sulfa antibiotics, erythromycin, zaleplon, codeine, and trazodone.  Allergy Precautions: None required Coagulopathies: Reviewed. None identified.  Blood-thinner therapy: None at this time Active Infection(s): Reviewed. None identified. Holly Spears is afebrile  Site Confirmation: Holly Spears was asked to confirm the procedure and laterality before marking the site Procedure checklist: Completed Consent: Before the procedure and under the influence of no sedative(s), amnesic(s), or anxiolytics, the patient was informed of the treatment options, risks and possible complications. To fulfill our ethical and legal obligations, as recommended by the American Medical Association's Code of Ethics, I have informed the patient of my clinical impression; the nature and purpose of the treatment or procedure; the risks, benefits, and possible complications of the intervention; the alternatives, including doing nothing; the risk(s) and benefit(s) of the alternative treatment(s) or procedure(s); and the risk(s) and benefit(s) of doing nothing. The patient was provided information about the general risks and possible complications associated with the procedure. These may include, but are not limited to: failure to achieve desired goals, infection, bleeding, organ or nerve damage, allergic reactions, paralysis, and death. In addition, the patient was informed of those risks and complications associated to the procedure, such as failure to decrease pain; infection; bleeding; organ or nerve damage with subsequent damage to sensory, motor, and/or autonomic systems, resulting in permanent pain, numbness, and/or weakness of one or several areas of the body; allergic reactions; (i.e.: anaphylactic reaction); and/or death. Furthermore, the patient was informed of those risks and complications associated with the medications. These include, but are not limited to: allergic  reactions (i.e.: anaphylactic or anaphylactoid reaction(s)); adrenal axis suppression; blood sugar elevation that in diabetics may result in ketoacidosis or comma; water retention that in patients with history of congestive heart failure may result in shortness of breath, pulmonary edema, and decompensation with resultant heart failure; weight gain; swelling or edema; medication-induced neural toxicity; particulate matter embolism and blood vessel occlusion with resultant organ, and/or nervous system infarction; and/or aseptic necrosis of one or more joints. Finally, the patient was informed that Medicine is not an exact science; therefore, there is also the possibility of unforeseen or unpredictable risks and/or possible complications that may result in a catastrophic outcome. The patient indicated having understood very clearly. We have given the patient no guarantees and we have made no promises. Enough time was given to the patient to ask questions, all of which were answered to the patient's satisfaction. Ms. Kaminskas has indicated that she wanted to continue with the procedure. Attestation: I, the ordering provider, attest that I have discussed with the patient the benefits, risks, side-effects, alternatives, likelihood of achieving goals, and potential problems during recovery for the procedure that I have provided informed consent. Date  Time: 01/01/2022  9:46 AM  Pre-Procedure Preparation:  Monitoring: As per clinic protocol. Respiration, ETCO2, SpO2, BP, heart rate and rhythm monitor placed and checked for adequate function Safety Precautions: Patient was assessed for positional comfort and pressure points before starting the procedure. Time-out: I initiated and conducted the "Time-out" before starting the procedure, as per protocol. The patient was asked to participate by confirming the accuracy of the "Time Out" information. Verification of the correct person, site, and procedure were performed  and confirmed by me, the nursing staff, and the patient. "Time-out" conducted as per Joint Commission's Universal Protocol (UP.01.01.01). Time: 1017  Description/Narrative of Procedure:          Target: Suprascapular nerve as it passes thru the lower portion of the suprascapular notch.  Location: Suprascapular notch Region: Shoulder, suprascapular Approach: Percutaneous   Rationale (medical necessity): procedure needed and proper for the diagnosis and/or treatment of the patient's medical symptoms and needs. Procedural Technique Safety Precautions: Aspiration looking for blood return was conducted prior to all injections. At no point did we inject any substances, as a needle was being advanced. No attempts were made at seeking any paresthesias. Safe injection practices and needle disposal techniques used. Medications properly checked for expiration dates. SDV (single dose vial) medications used. Description of the Procedure: Protocol guidelines were followed. The patient was placed in position over the procedure table. The target area was identified and the area prepped in the usual manner. The skin and muscle were infiltrated with local anesthetic. Appropriate amount of time allowed to pass for local anesthetics to take effect. Radiofrequency needles were introduced to the target area using fluoroscopic guidance. Using the NeuroTherm NT1100 Radiofrequency Generator, sensory stimulation using 50 Hz was used to locate & identify the nerve, making sure that the needle was positioned such that there was no sensory stimulation below 0.3 V or above 0.7 V. Stimulation using 2 Hz was used to evaluate the motor component. Care was taken not to lesion any nerves that demonstrated motor stimulation of the lower extremities at an output of less than 2.5 times that of the sensory threshold, or a maximum of 2.0 V. Once satisfactory placement of the needles was achieved, the numbing solution was slowly injected after  negative aspiration. After waiting for at least 2 minutes, the ablation was performed at 80 degrees C for 60 seconds, using regular Radiofrequency settings. Once the procedure was completed, the needles were then removed and the area cleansed, making sure to leave some of the prepping solution back to take advantage of its long term bactericidal properties. Intra-operative Compliance: Compliant   Vitals:   01/01/22 1027 01/01/22 1032 01/01/22 1036 01/01/22 1041  BP: (!) 133/92 (!) 140/86 138/85 (!) 140/84  Pulse: (!) 106 (!) 107 (!) 106 (!) 105  Resp: 12 13 15 16   Temp:      SpO2: 100% 100% 99% 99%  Weight:      Height:        Start Time: 1017 hrs. End Time: 1037 hrs.  Materials & Medications:  Needle(s) Type: Teflon-coated, curved tip, Radiofrequency needle(s) Gauge: 22G Length: 10cm Medication(s): Please see orders for medications and dosing details.  Imaging Guidance (Non-Spinal):          Type of Imaging Technique: Fluoroscopy Guidance (Non-Spinal) Indication(s): Assistance in needle guidance and placement for procedures requiring needle placement in or near specific anatomical locations not easily accessible without such assistance. Exposure Time: Please see nurses notes. Contrast: Before injecting any contrast, we confirmed that the patient did not have an allergy to iodine, shellfish, or radiological contrast. Once satisfactory needle placement was completed at the desired level, radiological contrast was injected. Contrast injected under live fluoroscopy. No contrast complications. See chart for type and volume of contrast used. Fluoroscopic Guidance: I was personally present during the use of fluoroscopy. "Tunnel Vision Technique" used to obtain the best possible view of the target area. Parallax error corrected before commencing the procedure. "Direction-depth-direction" technique used to introduce the needle under continuous pulsed fluoroscopy. Once target was reached,  antero-posterior, oblique, and lateral fluoroscopic projection used confirm needle placement in all planes. Images permanently stored in EMR. Interpretation: I personally interpreted the imaging intraoperatively. Adequate needle placement confirmed in multiple planes. Appropriate spread of contrast into desired area was  observed. No evidence of afferent or efferent intravascular uptake. Permanent images saved into the patient's record.  Antibiotic Prophylaxis:   Anti-infectives (From admission, onward)    None      Indication(s): None identified  Post-operative Assessment:  Post-procedure Vital Signs:  Pulse/HCG Rate: (!) 105  Temp: (!) 97.2 F (36.2 C) Resp: 16 BP: (!) 140/84 SpO2: 99 %  EBL: None  Complications: No immediate post-treatment complications observed by team, or reported by patient.  Note: The patient tolerated the entire procedure well. A repeat set of vitals were taken after the procedure and the patient was kept under observation following institutional policy, for this type of procedure. Post-procedural neurological assessment was performed, showing return to baseline, prior to discharge. The patient was provided with post-procedure discharge instructions, including a section on how to identify potential problems. Should any problems arise concerning this procedure, the patient was given instructions to immediately contact us, at any time, without hesitation. In any case, we plan to contact the patient by telephone for a follow-up status report regarding this interventional procedure.  Comments:  No additional relevant information.  Plan of Care  Orders:  Orders Placed This Encounter  Procedures   Radiofrequency Shoulder Joint    For shoulder pain.    Scheduling Instructions:     Procedure: Suprascapular Nerve Radiofrequency Ablation     Laterality: Right-sided     Level(s): Suprascapular nerve     Sedation: Patient's choice.     Scheduling Timeframe: Today     Order Specific Question:   Where will this procedure be performed?    Answer:   [facility]   [facility] C-ARM 1-60 MIN NO REPORT    Intraoperative interpretation by procedural physician at [place].    Standing Status:   Standing    Number of Occurrences:   1    Order Specific Question:   Reason for exam:    Answer:   Assistance in needle guidance and placement for procedures requiring needle placement in or near specific anatomical locations not easily accessible without such assistance.   Informed Consent Details: Physician/Practitioner Attestation; Transcribe to consent form and obtain patient signature    Note: Always confirm laterality of pain with Ms. Viernes, before procedure.    Order Specific Question:   Physician/Practitioner attestation of informed consent for procedure/surgical case    Answer:   I, the physician/practitioner, attest that I have discussed with the patient the benefits, risks, side effects, alternatives, likelihood of achieving goals and potential problems during recovery for the procedure that I have provided informed consent.    Order Specific Question:   Procedure    Answer:   Suprascapular Nerva Radiofrequency Ablation    Order Specific Question:   Physician/Practitioner performing the procedure    Answer:   Sharlisa Hollifield A. Dossie Arbour, MD    Order Specific Question:   Indication/Reason    Answer:   Chronic shoulder pain (arthralgia)   Provide equipment / supplies at bedside    Procedure tray: "Radiofrequency Tray" Additional material: Large hemostat (x1); Small hemostat (x1); Towels (x8); 4x4 sterile sponge pack (x1) Needle type: Teflon-coated Radiofrequency Needle (Disposable  single use) Size: Regular Quantity: 1    Standing Status:   Standing    Number of Occurrences:   1    Order Specific Question:   Specify    Answer:   Radiofrequency Tray   Schedule appointment    Schedule on my procedure day (Tuesday/Thursday).    Standing  Status:   Standing    Number of Occurrences:   1    Order Specific Question:   Specify    Answer:   (F2F) (Post-RFA Eval)   Chronic Opioid Analgesic:  Oxycodone IR 10 mg, 1 tab PO q 6 hrs (40 mg/day of oxycodone) MME/day: 60 mg/day.   Medications ordered for procedure: Meds ordered this encounter  Medications   lidocaine (XYLOCAINE) 2 % (with pres) injection 400 mg   pentafluoroprop-tetrafluoroeth (GEBAUERS) aerosol   ropivacaine (PF) 2 mg/mL (0.2%) (NAROPIN) injection 4 mL   methylPREDNISolone acetate (DEPO-MEDROL) injection 80 mg   Oxycodone HCl 10 MG TABS    Sig: Take 1 tablet (10 mg total) by mouth every 6 (six) hours as needed. Must last 30 days    Dispense:  120 tablet    Refill:  0    DO NOT: delete (not duplicate); no partial-fill (will deny script to complete), no refill request (F/U required). DISPENSE: 1 day early if closed on fill date. WARN: No CNS-depressants within 8 hrs of med.   naloxone (NARCAN) nasal spray 4 mg/0.1 mL    Sig: Place 1 spray into the nose as needed for up to 365 doses (for opioid-induced respiratory depresssion). In case of emergency (overdose), spray once into each nostril. If no response within 3 minutes, repeat application and call [phone].    Dispense:  1 each    Refill:  0    Instruct patient in proper use of device.   Oxycodone HCl 10 MG TABS    Sig: Take 1 tablet (10 mg total) by mouth every 6 (six) hours as needed. Must last 30 days    Dispense:  120 tablet    Refill:  0    DO NOT: delete (not duplicate); no partial-fill (will deny script to complete), no refill request (F/U required). DISPENSE: 1 day early if closed on fill date. WARN: No CNS-depressants within 8 hrs of med.   Oxycodone HCl 10 MG TABS    Sig: Take 1 tablet (10 mg total) by mouth every 6 (six) hours as needed. Must last 30 days    Dispense:  120 tablet    Refill:  0    DO NOT: delete (not duplicate); no partial-fill (will deny script to complete), no refill request (F/U  required). DISPENSE: 1 day early if closed on fill date. WARN: No CNS-depressants within 8 hrs of med.   Medications administered: We administered lidocaine, pentafluoroprop-tetrafluoroeth, ropivacaine (PF) 2 mg/mL (0.2%), and methylPREDNISolone acetate.  See the medical record for exact dosing, route, and time of administration.  Follow-up plan:   Return in about 6 weeks (around 02/12/2022) for Proc-day (T,Th), (PPE), (F2F).       Interventional Therapies  Risk Factors  Complex Considerations:   The patient had a bad experience with another physician receiving blocks.   Planned  Pending:   Therapeutic right suprascapular nerve RFA #1 (01/01/2022)    Under consideration:   Diagnostic bilateral lumbar facet block  Possible bilateral lumbar facet RFA  Diagnostic right L5-S1 LESI  Possible right L5 vs S1 TFESI  Diagnostic bilateral IA knee injections (w/ steroid)  Possible series of 5 IA Hyalgan knee injections  Diagnostic bilateral Genicular NB  Possible bilateral Genicular nerve RFA    Completed:   Diagnostic right suprascapular NB x1 (11/25/2021) (100/80/80/80)    Therapeutic  Palliative (PRN) options:   None.   Pharmacotherapy:  Nonopioids transferred 11/22/2019: Flexeril Recommendations:   None at this time.  Recent Visits Date Type Provider Dept  12/18/21 Office Visit Milinda Pointer, MD [facility]  11/25/21 Procedure visit Milinda Pointer, MD [facility]  11/06/21 Office Visit Milinda Pointer, MD [facility]  10/23/21 Procedure visit Milinda Pointer, MD [facility]  10/13/21 Office Visit Milinda Pointer, MD [facility]  Showing recent visits within past 90 days and meeting all other requirements Today's Visits Date Type Provider Dept  01/01/22 Procedure visit Milinda Pointer, MD [facility]  Showing today's visits and meeting all other requirements Future Appointments Date Type  Provider Dept  02/12/22 Appointment Milinda Pointer, MD [facility]  Showing future appointments within next 90 days and meeting all other requirements  Disposition: Discharge home  Discharge (Date  Time): 01/01/2022; 1045 hrs.   Primary Care Physician: Lonzo Cloud, PA-C Location: [facility] Note by: Gaspar Cola, MD Date: 01/01/2022; Time: 10:52 AM  Disclaimer:  Medicine is not an Chief Strategy Officer. The only guarantee in medicine is that nothing is guaranteed. It is important to note that the decision to proceed with this intervention was based on the information collected from the patient. The Data and conclusions were drawn from the patient's questionnaire, the interview, and the physical examination. Because the information was provided in large part by the patient, it cannot be guaranteed that it has not been purposely or unconsciously manipulated. Every effort has been made to obtain as much relevant data as possible for this evaluation. It is important to note that the conclusions that lead to this procedure are derived in large part from the available data. Always take into account that the treatment will also be dependent on availability of resources and existing treatment guidelines, considered by other Pain Management Practitioners as being common knowledge and practice, at the time of the intervention. For Medico-Legal purposes, it is also important to point out that variation in procedural techniques and pharmacological choices are the acceptable norm. The indications, contraindications, technique, and results of the above procedure should only be interpreted and judged by a Board-Certified Interventional Pain Specialist with extensive familiarity and expertise in the same exact procedure and technique.

## 2022-01-01 ENCOUNTER — Encounter: Payer: Self-pay | Admitting: Pain Medicine

## 2022-01-01 ENCOUNTER — Ambulatory Visit: Payer: 59 | Attending: Pain Medicine | Admitting: Pain Medicine

## 2022-01-01 ENCOUNTER — Ambulatory Visit
Admission: RE | Admit: 2022-01-01 | Discharge: 2022-01-01 | Disposition: A | Discharge status: Home / Self Care | Payer: 59 | Source: Ambulatory Visit | Attending: Pain Medicine | Admitting: Pain Medicine

## 2022-01-01 VITALS — BP 140/84 | HR 105 | Temp 97.2°F | Resp 16 | Ht 66.0 in | Wt 165.0 lb

## 2022-01-01 DIAGNOSIS — M19011 Primary osteoarthritis, right shoulder: Secondary | ICD-10-CM | POA: Insufficient documentation

## 2022-01-01 DIAGNOSIS — Z79891 Long term (current) use of opiate analgesic: Secondary | ICD-10-CM

## 2022-01-01 DIAGNOSIS — M7918 Myalgia, other site: Secondary | ICD-10-CM

## 2022-01-01 DIAGNOSIS — M25512 Pain in left shoulder: Secondary | ICD-10-CM

## 2022-01-01 DIAGNOSIS — G8929 Other chronic pain: Secondary | ICD-10-CM | POA: Diagnosis not present

## 2022-01-01 DIAGNOSIS — M961 Postlaminectomy syndrome, not elsewhere classified: Secondary | ICD-10-CM

## 2022-01-01 DIAGNOSIS — Z79899 Other long term (current) drug therapy: Secondary | ICD-10-CM

## 2022-01-01 DIAGNOSIS — G894 Chronic pain syndrome: Secondary | ICD-10-CM

## 2022-01-01 DIAGNOSIS — M25511 Pain in right shoulder: Secondary | ICD-10-CM | POA: Diagnosis not present

## 2022-01-01 MED ORDER — PENTAFLUOROPROP-TETRAFLUOROETH EX AERO
INHALATION_SPRAY | Freq: Once | CUTANEOUS | Status: AC
  Administered 2022-01-01: 30 via TOPICAL
  Filled 2022-01-01: qty 116

## 2022-01-01 MED ORDER — LIDOCAINE HCL 2 % IJ SOLN
20.0000 mL | Freq: Once | INTRAMUSCULAR | Status: AC
  Administered 2022-01-01: 400 mg
  Filled 2022-01-01: qty 20

## 2022-01-01 MED ORDER — ROPIVACAINE HCL 2 MG/ML IJ SOLN
4.0000 mL | Freq: Once | INTRAMUSCULAR | Status: AC
  Administered 2022-01-01: 4 mL via PERINEURAL
  Filled 2022-01-01: qty 20

## 2022-01-01 MED ORDER — OXYCODONE HCL 10 MG PO TABS: 10.0000 mg | ORAL_TABLET | Freq: Four times a day (QID) | ORAL | 0 refills | Status: DC | PRN

## 2022-01-01 MED ORDER — NALOXONE HCL 4 MG/0.1ML NA LIQD: 1.0000 | NASAL | 0 refills | Status: DC | PRN

## 2022-01-01 MED ORDER — METHYLPREDNISOLONE ACETATE 80 MG/ML IJ SUSP
80.0000 mg | Freq: Once | INTRAMUSCULAR | Status: AC
  Administered 2022-01-01: 80 mg
  Filled 2022-01-01: qty 1

## 2022-01-01 NOTE — Progress Notes (Signed)
Safety precautions to be maintained throughout the outpatient stay will include: orient to surroundings, keep bed in low position, maintain call bell within reach at all times, provide assistance with transfer out of bed and ambulation.  

## 2022-01-01 NOTE — Patient Instructions (Addendum)
___________________________________________________________________________________________  Post-Radiofrequency (RF) Discharge Instructions  You have just completed a Radiofrequency Neurotomy.  The following instructions will provide you with information and guidelines for self-care upon discharge.  If at any time you have questions or concerns please call your physician. DO NOT DRIVE YOURSELF!!  Instructions:  Apply ice: Fill a plastic sandwich bag with crushed ice. Cover it with a small towel and apply to injection site. Apply for 15 minutes then remove x 15 minutes. Repeat sequence on day of procedure, until you go to bed. The purpose is to minimize swelling and discomfort after procedure.  Apply heat: Apply heat to procedure site starting the day following the procedure. The purpose is to treat any soreness and discomfort from the procedure.  Food intake: No eating limitations, unless stipulated above.  Nevertheless, if you have had sedation, you may experience some nausea.  In this case, it may be wise to wait at least two hours prior to resuming regular diet.  Physical activities: Keep activities to a minimum for the first 8 hours after the procedure. For the first 24 hours after the procedure, do not drive a motor vehicle,  Operate heavy machinery, power tools, or handle any weapons.  Consider walking with the use of an assistive device or accompanied by an adult for the first 24 hours.  Do not drink alcoholic beverages including beer.  Do not make any important decisions or sign any legal documents. Go home and rest today.  Resume activities tomorrow, as tolerated.  Use caution in moving about as you may experience mild leg weakness.  Use caution in cooking, use of household electrical appliances and climbing steps.  Driving: If you have received any sedation, you are not allowed to drive for 24 hours after your procedure.  Blood thinner: Restart your blood thinner 6 hours after your  procedure. (Only for those taking blood thinners)  Insulin: As soon as you can eat, you may resume your normal dosing schedule. (Only for those taking insulin)  Medications: May resume pre-procedure medications.  Do not take any drugs, other than what has been prescribed to you.  Infection prevention: Keep procedure site clean and dry.  Post-procedure Pain Diary: Extremely important that this be done correctly and accurately. Recorded information will be used to determine the next step in treatment.  Pain evaluated is that of treated area only. Do not include pain from an untreated area.  Complete every hour, on the hour, for the initial 8 hours. Set an alarm to help you do this part accurately.  Do not go to sleep and have it completed later. It will not be accurate.  Follow-up appointment: Keep your follow-up appointment after the procedure. Usually 2-6 weeks after radiofrequency. Bring you pain diary. The information collected will be essential for your long-term care.   Expect:  From numbing medicine (AKA: Local Anesthetics): Numbness or decrease in pain.  Onset: Full effect within 15 minutes of injected.  Duration: It will depend on the type of local anesthetic used. On the average, 1 to 8 hours.   From steroids (when added): Decrease in swelling or inflammation. Once inflammation is improved, relief of the pain will follow.  Onset of benefits: Depends on the amount of swelling present. The more swelling, the longer it will take for the benefits to be seen. In some cases, up to 10 days.  Duration: Steroids will stay in the system x 2 weeks. Duration of benefits will depend on multiple posibilities including persistent irritating factors.    this may last as long as 6 weeks. Additional post-procedure pain medication is provided for this. Discomfort is minimized if ice and heat are applied as  instructed.  Call if: You experience numbness and weakness that gets worse with time, as opposed to wearing off. He experience any unusual bleeding, difficulty breathing, or loss of the ability to control your bowel and bladder. (This applies to Spinal procedures only) You experience any redness, swelling, heat, red streaks, elevated temperature, fever, or any other signs of a possible infection.  Emergency Numbers: Durning business hours (Monday - Thursday, 8:00 AM - 4:00 PM) (Friday, 9:00 AM - 12:00 Noon): [phone] After hours: [phone] ____________________________________________________________________________________________    ____________________________________________________________________________________________  Virtual Visits   What is a "Virtual Visit"? It is a Mining engineer (medical visit) that takes place on real time (NOT TEXT or E-MAIL) over the telephone or computer device (desktop, laptop, tablet, smart phone, etc.). It allows for more location flexibility between the patient and the healthcare provider.  Who decides when these types of visits will be used? The physician.  Who is eligible for these types of visits? Only those patients that can be reliably reached over the telephone.  What do you mean by reliably? We do not have time to call everyone multiple times, therefore those that tend to screen calls and then call back later are not suitable candidates for this system. We understand how people are reluctant to pickup on "unknown" calls, therefore, we suggest adding our telephone numbers to your list of "CONTACT(s)". This way, you should be able to readily identify our calls when you receive one. All of our numbers are available below.   Who is not eligible? This option is not available for medication management encounters, specially for controlled substances. Patients on pain medications that fall under the category of  controlled substances have to come in for "Face-to-Face" encounters. This is required for mandatory monitoring of these substances. You may be asked to provide a sample for an unannounced urine drug screening test (UDS), and we will need to count your pain pills. Not bringing your pills to be counted may result in no refill. Obviously, neither one of these can be done over the phone.  When will this type of visits be used? You can request a virtual visit whenever you are physically unable to attend a regular appointment. The decision will be made by the physician (or healthcare provider) on a case by case basis.   At what time will I be called? This is an excellent question. The providers will try to call you whenever they have time available. Do not expect to be called at any specific time. The secretaries will assign you a time for your virtual visit appointment, but this is done simply to keep a list of those patients that need to be called, but not for the purpose of keeping a time schedule. Be advised that the call may come in anytime during the day, between the hours of 8:00 AM and 8::00 PM, depending on provider availability. We do understand that the system is not perfect. If you are unable to be available that day on a moments notice, then request an "in-person" appointment rather than a "virtual visit".  Can I request my medication visits to be "Virtual"? Yes you may request it, but the decision is entirely up to the healthcare provider. Control substances require specific monitoring that requires Face-to-Face encounters. The number of encounters  and the extent of  the monitoring is determined on a case by case basis.  Add a new contact to your smart phone and label it "PAIN CLINIC" Under this contact add the following numbers: Main: [phone] (Official Contact Number) Nurses: [phone] (These are outgoing only calling systems. Do not call this number.) Dr. Laban Emperor: [phone]  or [phone] (Outgoing calls only. Do not call this number.)  ____________________________________________________________________________________________   _______________________________________________________________________  Medication Rules  Purpose: To inform patients, and their family members, of our medication rules and regulations.  Applies to: All patients receiving prescriptions from our practice (written or electronic).  Pharmacy of record: This is the pharmacy where your electronic prescriptions will be sent. Make sure we have the correct one.  Electronic prescriptions: In compliance with the [facility] Strengthen Opioid Misuse Prevention (STOP) Act of 2017 (Session Conni Elliot [phone]), effective February 02, 2018, all controlled substances must be electronically prescribed. Written prescriptions, faxing, or calling prescriptions to a pharmacy will no longer be done.  Prescription refills: These will be provided only during in-person appointments. No medications will be renewed without a "face-to-face" evaluation with your provider. Applies to all prescriptions.  NOTE: The following applies primarily to controlled substances (Opioid* Pain Medications).   Type of encounter (visit): For patients receiving controlled substances, face-to-face visits are required. (Not an option and not up to the patient.)  Patient's responsibilities: Pain Pills: Bring all pain pills to every appointment (except for procedure appointments). Pill Bottles: Bring pills in original pharmacy bottle. Bring bottle, even if empty. Always bring the bottle of the most recent fill.  Medication refills: You are responsible for knowing and keeping track of what medications you are taking and when is it that you will need a refill. The day before your appointment: write a list of all prescriptions that need to be refilled. The day of the appointment: give the list to the admitting nurse. Prescriptions  will be written only during appointments. No prescriptions will be written on procedure days. If you forget a medication: it will not be "Called in", "Faxed", or "electronically sent". You will need to get another appointment to get these prescribed. No early refills. Do not call asking to have your prescription filled early. Partial  or short prescriptions: Occasionally your pharmacy may not have enough pills to fill your prescription.  NEVER ACCEPT a partial fill or a prescription that is short of the total amount of pills that you were prescribed.  With controlled substances the law allows 72 hours for the pharmacy to complete the prescription.  If the prescription is not completed within 72 hours, the pharmacist will require a new prescription to be written. This means that you will be short on your medicine and we WILL NOT send another prescription to complete your original prescription.  Instead, request the pharmacy to send a carrier to a nearby branch to get enough medication to provide you with your full prescription. Prescription Accuracy: You are responsible for carefully inspecting your prescriptions before leaving our office. Have the discharge nurse carefully go over each prescription with you, before taking them home. Make sure that your name is accurately spelled, that your address is correct. Check the name and dose of your medication to make sure it is accurate. Check the number of pills, and the written instructions to make sure they are clear and accurate. Make sure that you are given enough medication to last until your next medication refill appointment. Taking Medication: Take medication as prescribed. When it comes  to controlled substances, taking less pills or less frequently than prescribed is permitted and encouraged. Never take more pills than instructed. Never take the medication more frequently than prescribed.  Inform other Doctors: Always inform, all of your healthcare  providers, of all the medications you take. Pain Medication from other Providers: You are not allowed to accept any additional pain medication from any other Doctor or Healthcare provider. There are two exceptions to this rule. (see below) In the event that you require additional pain medication, you are responsible for notifying us, as stated below. Cough Medicine: Often these contain an opioid, such as codeine or hydrocodone. Never accept or take cough medicine containing these opioids if you are already taking an opioid* medication. The combination may cause respiratory failure and death. Medication Agreement: You are responsible for carefully reading and following our Medication Agreement. This must be signed before receiving any prescriptions from our practice. Safely store a copy of your signed Agreement. Violations to the Agreement will result in no further prescriptions. (Additional copies of our Medication Agreement are available upon request.) Laws, Rules, & Regulations: All patients are expected to follow all [address] and [employer], Rules,  [place]. Ignorance of the Laws does not constitute a valid excuse.  Illegal drugs and Controlled Substances: The use of illegal substances (including, but not limited to marijuana and its derivatives) and/or the illegal use of any controlled substances is strictly prohibited. Violation of this rule may result in the immediate and permanent discontinuation of any and all prescriptions being written by our practice. The use of any illegal substances is prohibited. Adopted CDC guidelines & recommendations: Target dosing levels will be at or below 60 MME/day. Use of benzodiazepines** is not recommended.  Exceptions: There are only two exceptions to the rule of not receiving pain medications from other Healthcare Providers. Exception #1 (Emergencies): In the event of an emergency (i.e.: accident requiring emergency care), you are allowed to receive  additional pain medication. However, you are responsible for: As soon as you are able, call our office [phone], at any time of the day or night, and leave a message stating your name, the date and nature of the emergency, and the name and dose of the medication prescribed. In the event that your call is answered by a member of our staff, make sure to document and save the date, time, and the name of the person that took your information.  Exception #2 (Planned Surgery): In the event that you are scheduled by another doctor or dentist to have any type of surgery or procedure, you are allowed (for a period no longer than 30 days), to receive additional pain medication, for the acute post-op pain. However, in this case, you are responsible for picking up a copy of our "Post-op Pain Management for Surgeons" handout, and giving it to your surgeon or dentist. This document is available at our office, and does not require an appointment to obtain it. Simply go to our office during business hours (Monday-Thursday from 8:00 AM to 4:00 PM) (Friday 8:00 AM to 12:00 Noon) or if you have a scheduled appointment with [place], prior to your surgery, and ask for it by name. In addition, you are responsible for: calling our office [phone], at any time of the day or night, and leaving a message stating your name, name of your surgeon, type of surgery, and date of procedure or surgery. Failure to comply with your responsibilities may result in termination of therapy involving  the controlled substances. Medication Agreement Violation. Following the above rules, including your responsibilities will help you in avoiding a Medication Agreement Violation ("Breaking your Pain Medication Contract").  Consequences:  Not following the above rules may result in permanent discontinuation of medication prescription therapy.  *Opioid medications include: morphine, codeine, oxycodone, oxymorphone, hydrocodone, hydromorphone,  meperidine, tramadol, tapentadol, buprenorphine, fentanyl, methadone. **Benzodiazepine medications include: diazepam (Valium), alprazolam (Xanax), clonazepam (Klonopine), lorazepam (Ativan), clorazepate (Tranxene), chlordiazepoxide (Librium), estazolam (Prosom), oxazepam (Serax), temazepam (Restoril), triazolam (Halcion) (Last updated: 11/25/2021) ______________________________________________________________________    ______________________________________________________________________  Medication Recommendations and Reminders  Applies to: All patients receiving prescriptions (written and/or electronic).  Medication Rules & Regulations: You are responsible for reading, knowing, and following our "Medication Rules" document. These exist for your safety and that of others. They are not flexible and neither are we. Dismissing or ignoring them is an act of "non-compliance" that may result in complete and irreversible termination of such medication therapy. For safety reasons, "non-compliance" will not be tolerated. As with the [place] fundamental legal principle of "ignorance of the law is no defense", we will accept no excuses for not having read and knowing the content of documents provided to you by our practice.  Pharmacy of record:  Definition: This is the pharmacy where your electronic prescriptions will be sent.  We do not endorse any particular pharmacy. It is up to you and your insurance to decide what pharmacy to use.  We do not restrict you in your choice of pharmacy. However, once we write for your prescriptions, we will NOT be re-sending more prescriptions to fix restricted supply problems created by your pharmacy, or your insurance.  The pharmacy listed in the electronic medical record should be the one where you want electronic prescriptions to be sent. If you choose to change pharmacy, simply notify our nursing staff. Changes will be made only during your regular appointments and not  over the phone.  Recommendations: Keep all of your pain medications in a safe place, under lock and key, even if you live alone. We will NOT replace lost, stolen, or damaged medication. We do not accept "Police Reports" as proof of medications having been stolen. After you fill your prescription, take 1 week's worth of pills and put them away in a safe place. You should keep a separate, properly labeled bottle for this purpose. The remainder should be kept in the original bottle. Use this as your primary supply, until it runs out. Once it's gone, then you know that you have 1 week's worth of medicine, and it is time to come in for a prescription refill. If you do this correctly, it is unlikely that you will ever run out of medicine. To make sure that the above recommendation works, it is very important that you make sure your medication refill appointments are scheduled at least 1 week before you run out of medicine. To do this in an effective manner, make sure that you do not leave the office without scheduling your next medication management appointment. Always ask the nursing staff to show you in your prescription , when your medication will be running out. Then arrange for the receptionist to get you a return appointment, at least 7 days before you run out of medicine. Do not wait until you have 1 or 2 pills left, to come in. This is very poor planning and does not take into consideration that we may need to cancel appointments due to bad weather, sickness, or emergencies affecting our staff. DO  NOT ACCEPT A "Partial Fill": If for any reason your pharmacy does not have enough pills/tablets to completely fill or refill your prescription, do not allow for a "partial fill". The law allows the pharmacy to complete that prescription within 72 hours, without requiring a new prescription. If they do not fill the rest of your prescription within those 72 hours, you will need a separate prescription to fill the  remaining amount, which we will NOT provide. If the reason for the partial fill is your insurance, you will need to talk to the pharmacist about payment alternatives for the remaining tablets, but again, DO NOT ACCEPT A PARTIAL FILL, unless you can trust your pharmacist to obtain the remainder of the pills within 72 hours.  Prescription refills and/or changes in medication(s):  Prescription refills, and/or changes in dose or medication, will be conducted only during scheduled medication management appointments. (Applies to both, written and electronic prescriptions.) No refills on procedure days. No medication will be changed or started on procedure days. No changes, adjustments, and/or refills will be conducted on a procedure day. Doing so will interfere with the diagnostic portion of the procedure. No phone refills. No medications will be "called into the pharmacy". No Fax refills. No weekend refills. No Holliday refills. No after hours refills.  Remember:  Business hours are:  Monday to Thursday 8:00 AM to 4:00 PM Provider's Schedule: Delano Metz, MD - Appointments are:  Medication management: Monday and Wednesday 8:00 AM to 4:00 PM Procedure day: Tuesday and Thursday 7:30 AM to 4:00 PM Edward Jolly, MD - Appointments are:  Medication management: Tuesday and Thursday 8:00 AM to 4:00 PM Procedure day: Monday and Wednesday 7:30 AM to 4:00 PM (Last update: 11/25/2021) ______________________________________________________________________    ____________________________________________________________________________________________  Pharmacy Shortages of Pain Medication   Introduction Shockingly as it may seem, .  "No U.[place] Supreme Court decision has ever interpreted the Constitution as guaranteeing a right to health care for all Americans." - [employer]  "With respect to human rights, the [place]  has no formally codified right to health, nor does it participate in a human rights treaty that specifies a right to health." - Scott J. Schweikart, JD, MBE  Situation By now, most of our patients have had the experience of being told by their pharmacist that they do not have enough medication to cover their prescription. If you have not had this experience, just know that you soon will.  Problem There appears to be a shortage of these medications, either at the national level or locally. This is happening with all pharmacies. When there is not enough medication, patients are offered a partial fill and they are told that they will try to get the rest of the medicine for them at a later time. If they do not have enough for even a partial fill, the pharmacists are telling the patients to call us (the prescribing physicians) to request that we send another prescription to another pharmacy to get the medicine.   This reordering of a controlled substance creates documentation problems where additional paperwork needs to be created to explain why two prescriptions for the same period of time and the same medicine are being prescribed to the same patient. It also creates situations where the last appointment note does not accurately reflect when and what prescriptions were given to a patient. This leads to prescribing errors down the line, in subsequent follow-up visits.   [employer] of Pharmacy ([employer]) Research revealed that Marketing [employer]  Rule .1806 (21 NCAC 46.1806) authorizes pharmacists to the transfer of prescriptions among pharmacies, and it sets forth procedural and recordkeeping requirements for doing so. However, this requires the pharmacist to complete the previously mentioned procedural paperwork to accomplish the transfer. As it turns out, it is much easier for them to have the prescribing physicians do the work.   Possible solutions 1. You can ask your physician to assist you in  weaning yourself off these medications. 2. Ask your pharmacy if the medication is in stock, 3 days prior to your refill. 3. If you need a pharmacy change, let us know at your medication management visit. Prescriptions that have already been electronically sent to a pharmacy will not be re-sent to a different pharmacy if your pharmacy of record does not have it in stock. Proper stocking of medication is a pharmacy problem, not a prescriber problem. Work with your pharmacist to solve the problem. 4. Have the [facility] Assembly add a provision to the "STOP ACT" (the law that mandates how controlled substances are prescribed) where there is an exception to the electronic prescribing rule that states that in the event there are shortages of medications the physicians are allowed to use written prescriptions as opposed to electronic ones. This would allow patients to take their prescriptions to a different pharmacy that may have enough medication available to fill the prescription. The problem is that currently there is a law that does not allow for written prescriptions, with the exception of instances where the electronic medical record is down due to technical issues.  5. Have [place] Congress ease the pressure on pharmaceutical companies, allowing them to produce enough quantities of the medication to adequately supply the population. 6. Have pharmacies keep enough stocks of these medications to cover their client base.  7. Have the [facility] Assembly add a provision to the "STOP ACT" where they ease the regulations surrounding the transfer of controlled substances between pharmacies, so as to simplify the transfer of supplies. As an alternative, develop a system to allow patients to obtain the remainder of their prescription at another one of their pharmacies or at an associate pharmacy.   How this shortage will affect you.  Understand that this is a pharmacy supply problem, not a prescriber  problem. Work with your pharmacy to solve it. The job of the prescriber is to evaluate and monitor the patient for the appropriate indications and use of these medicines. It is not the job of the prescriber to supply the medication or to solve problems with that supply. The responsibility and the choice to obtain the medication resides on the patient. By law, supplying the medication is the job of the pharmacy. It is certainly not the job of the prescriber to solve supply problems.   Due to the above problems we are no longer taking patients to write for their pain medication. Future discussions with your physician may include potentially weaning medications or transitioning to alternatives.  We will be focusing primarily on interventional based pain management. We will continue to evaluate for appropriate indications and we may provide recommendations regarding medication, dose, and schedule, as well as monitoring recommendations, however, we will not be taking over the actual prescribing of these substances. On those patients where we are treating their chronic pain with interventional therapies, exceptions will be considered on a case by case basis. At this time, we will try to continue providing this supplemental service to those patients we have been managing  in the past. However, as of August 1st, 2023, we no longer will be sending additional prescriptions to other pharmacies for the purpose of solving their supply problems. Once we send a prescription to a pharmacy, we will not be resending it again to another pharmacy to cover for their shortages.   What to do. Write as many letters as you can. Recruit the help of family members in writing these letters. Below are some of the places where you can write to make your voice heard. Let them know what the problem is and push them to look for solutions.   Search internet for: "[employer] find your  legislators" iixl.com  Search internet for: "[employer] commissioner complaints" RareVoices.pl  Search internet for: "[employer] of Pharmacy complaints" MobLag.com.cy.htm  Search internet for: "[facility] pharmacy complaints" Email [facility] Customer Relations [email or web address]  Search internet for: Architectural technologist customer service complaints" [email or web address]  ____________________________________________________________________________________________     ____________________________________________________________________________________________  Drug Holidays (Slow)  What is a "Drug Holiday"? Drug Holiday: is the name given to the period of time during which a patient stops taking a medication(s) for the purpose of eliminating tolerance to the drug.  Benefits Improved effectiveness of opioid medication. Decreased opioid dose needed to achieve benefits. Improved pain with lesser dose. Ending dependence on high dose therapy. Possible decrease in cost of therapy. It may uncover "opioid-induced hyperalgesia". (OIH)  What is "opioid hyperalgesia"? It is an increased paradoxical pain sensitization state caused by exposure to opioids, whereby a patient receiving opioids for the treatment of pain could actually become more sensitive to a painful stimuli. Stopping the opioid pain medication, contrary to the expected increase in the pain, it actually decreases or completely eliminates the pain. Ref.: "A comprehensive review of opioid-induced hyperalgesia". Donney Rankins, et.al. Pain Physician. 2011 Mar-Apr;14(2):145-61.  What is tolerance? Tolerance: is the progressive decreased in effectiveness of a drug due to its repetitive use. With repetitive  use, the body gets use to the medication and as a consequence, it loses its effectiveness. This is a common problem seen with opioid pain medications. As a result, a larger dose of the drug is needed to achieve the same effect that used to be obtained with a smaller dose.  How long should a "Drug Holiday" last? Effectiveness depends on the patient staying off all opioid pain medicines for a minimum of 14 consecutive days. (2 weeks)  How about just taking less of the medicine? Does not work. This will not eliminate the excess receptors.  How about switching to a different pain medicine? (AKA. "Opioid rotation") This "technique" was promoted by studies funded by [employer], such as [employer], creators of "OxyContin". It does not work. It only creates the illusion of effectiveness by taking advantage of inaccurate equivalent dose calculations between different opioid medications.   Should I stop the medicine "cold [place]"? No. You should always coordinate with your Pain Specialist so that he/she can provide you with the correct medication dose to make the transition as smoothly as possible.  How do I stop the medicine? Slowly. You will be instructed to decrease the daily amount of pills that you take by one (1) pill every seven (7) days. This is called a "slow downward taper" of your dose. For example: if you normally take four (4) pills per day, you will be asked to drop this dose to three (3) pills per day for seven (7) days, then to two (2) pills per day for seven (7)  days, then to one (1) per day for seven (7) days, and at the end of those last seven (7) days, this is when the "Drug Holiday" would start.   How about withdrawals? Typically, what triggers withdrawals is the sudden stop of a high dose opioid medication. Withdrawals can usually be avoided by slowly decreasing the dose over a prolonged period of time. If you attempt to stop your medication abruptly, withdrawals may be  possible.  What are withdrawals? Withdrawals: refers to the wide range of symptoms that occur after stopping or dramatically reducing opiate drugs after heavy and prolonged use. Withdrawal symptoms do not occur to patients that use low dose opioids, or those who take the medication sporadically. Contrary to benzodiazepine (example: Valium, Xanax, etc.) or alcohol withdrawals ("Delirium Tremens"), opioid withdrawals are not lethal. Withdrawals are the physical manifestation of the body getting rid of the excess receptors.  Withdrawal Symptoms Early symptoms of withdrawal may include: Agitation Anxiety Muscle aches Increased tearing Insomnia Runny nose Sweating Yawning  Late symptoms of withdrawal may include: Abdominal cramping Diarrhea Dilated pupils Goose bumps Nausea Vomiting  Will I experience withdrawals? Due to the slow nature of the taper, it is very unlikely.  What is a slow taper? Taper: refers to the gradual decrease in dose.  (Last update: 12/29/2021) ____________________________________________________________________________________________    ____________________________________________________________________________________________  CBD (cannabidiol) & Delta-8 (Delta-8 tetrahydrocannabinol) WARNING  Intro: Cannabidiol (CBD) and tetrahydrocannabinol (THC), are two natural compounds found in plants of the Cannabis genus. They can both be extracted from hemp or cannabis. Hemp and cannabis come from the Cannabis sativa plant. Both compounds interact with your body's endocannabinoid system, but they have very different effects. CBD does not produce the high sensation associated with cannabis. Delta-8 tetrahydrocannabinol, also known as delta-8 THC, is a psychoactive substance found in the Cannabis sativa plant, of which marijuana and hemp are two varieties. THC is responsible for the high associated with the illicit use of marijuana.  Applicable to: All individuals  currently taking or considering taking CBD (cannabidiol) and, more important, all patients taking opioid analgesic controlled substances (pain medication). (Example: oxycodone; oxymorphone; hydrocodone; hydromorphone; morphine; methadone; tramadol; tapentadol; fentanyl; buprenorphine; butorphanol; dextromethorphan; meperidine; codeine; etc.)  Legal status: CBD remains a Schedule I drug prohibited for any use. CBD is illegal with one exception. In the [place], CBD has a limited Education officer, environmental (FDA) approval for the treatment of two specific types of epilepsy disorders. Only one CBD product has been approved by the FDA for this purpose: "Epidiolex". FDA is aware that some companies are marketing products containing cannabis and cannabis-derived compounds in ways that violate the [employer], Drug and Cosmetic Act [facility] Act) and that may put the health and safety of consumers at risk. The FDA, a Federal agency, has not enforced the CBD status since 2018. UPDATE: (03/21/2021) The Drug Enforcement Agency (DEA) issued a letter stating that "delta" cannabinoids, including Delta-8-THCO and Delta-9-THCO, synthetically derived from hemp do not qualify as hemp and will be viewed as Schedule I drugs. (Schedule I drugs, substances, or chemicals are defined as drugs with no currently accepted medical use and a high potential for abuse. Some examples of Schedule I drugs are: heroin, lysergic acid diethylamide (LSD), marijuana (cannabis), 3,4-methylenedioxymethamphetamine (ecstasy), methaqualone, and peyote.) (CueTune.com.ee)  Legality: Some manufacturers ship CBD products nationally, which is illegal. Often such products are sold online and are therefore available throughout the country. CBD is openly sold in head shops and health food stores in some states  where such sales have not been explicitly legalized. Selling unapproved products with unsubstantiated therapeutic claims is not only a violation  of the law, but also can put patients at risk, as these products have not been proven to be safe or effective. Federal illegality makes it difficult to conduct research on CBD.  Reference: "FDA Regulation of Cannabis and Cannabis-Derived Products, Including Cannabidiol (CBD)" - OEMDeals.dk  Warning: CBD is not FDA approved and has not undergo the same manufacturing controls as prescription drugs.  This means that the purity and safety of available CBD may be questionable. Most of the time, despite manufacturer's claims, it is contaminated with THC (delta-9-tetrahydrocannabinol - the chemical in marijuana responsible for the "HIGH").  When this is the case, the [facility] contaminant will trigger a positive urine drug screen (UDS) test for Marijuana (carboxy-THC). Because a positive UDS for any illicit substance is a violation of our medication agreement, your opioid analgesics (pain medicine) may be permanently discontinued. The FDA recently put out a warning about 5 things that everyone should be aware of regarding Delta-8 THC: Delta-8 THC products have not been evaluated or approved by the FDA for safe use and may be marketed in ways that put the public health at risk. The FDA has received adverse event reports involving delta-8 THC-containing products. Delta-8 THC has psychoactive and intoxicating effects. Delta-8 THC manufacturing often involve use of potentially harmful chemicals to create the concentrations of delta-8 THC claimed in the marketplace. The final delta-8 THC product may have potentially harmful by-products (contaminants) due to the chemicals used in the process. Manufacturing of delta-8 THC products may occur in uncontrolled or unsanitary settings, which may lead to the presence of unsafe contaminants or other potentially harmful substances. Delta-8 THC products should be kept  out of the reach of children and pets.  MORE ABOUT CBD  General Information: CBD was discovered in [birth year] and it is a derivative of the cannabis sativa genus plants (Marijuana and Hemp). It is one of the 113 identified substances found in Marijuana. It accounts for up to 40% of the plant's extract. As of 2018, preliminary clinical studies on CBD included research for the treatment of anxiety, movement disorders, and pain. CBD is available and consumed in multiple forms, including inhalation of smoke or vapor, as an aerosol spray, and by mouth. It may be supplied as an oil containing CBD, capsules, dried cannabis, or as a liquid solution. CBD is thought not to be as psychoactive as THC (delta-9-tetrahydrocannabinol - the chemical in marijuana responsible for the "HIGH"). Studies suggest that CBD may interact with different biological target receptors in the body, including cannabinoid and other neurotransmitter receptors. As of 2018 the mechanism of action for its biological effects has not been determined.  Side-effects  Adverse reactions: Dry mouth, diarrhea, decreased appetite, fatigue, drowsiness, malaise, weakness, sleep disturbances, and others.  Drug interactions: CBC may interact with other medications such as blood-thinners. Because CBD causes drowsiness on its own, it also increases the drowsiness caused by other medications, including antihistamines (such as Benadryl), benzodiazepines (Xanax, Ativan, Valium), antipsychotics, antidepressants and opioids, as well as alcohol and supplements such as kava, melatonin and St. John's Wort. Be cautious with the following combinations:   Brivaracetam (Briviact) Brivaracetam is changed and broken down by the body. CBD might decrease how quickly the body breaks down brivaracetam. This might increase levels of brivaracetam in the body.  Caffeine Caffeine is changed and broken down by the body. CBD might decrease how quickly the body  breaks down caffeine.  This might increase levels of caffeine in the body.  Carbamazepine (Tegretol) Carbamazepine is changed and broken down by the body. CBD might decrease how quickly the body breaks down carbamazepine. This might increase levels of carbamazepine in the body and increase its side effects.  Citalopram (Celexa) Citalopram is changed and broken down by the body. CBD might decrease how quickly the body breaks down citalopram. This might increase levels of citalopram in the body and increase its side effects.  Clobazam (Onfi) Clobazam is changed and broken down by the liver. CBD might decrease how quickly the liver breaks down clobazam. This might increase the effects and side effects of clobazam.  Eslicarbazepine (Aptiom) Eslicarbazepine is changed and broken down by the body. CBD might decrease how quickly the body breaks down eslicarbazepine. This might increase levels of eslicarbazepine in the body by a small amount.  Everolimus (Zostress) Everolimus is changed and broken down by the body. CBD might decrease how quickly the body breaks down everolimus. This might increase levels of everolimus in the body.  Lithium Taking higher doses of CBD might increase levels of lithium. This can increase the risk of lithium toxicity.  Medications changed by the liver (Cytochrome P450 1A1 (CYP1A1) substrates) Some medications are changed and broken down by the liver. CBD might change how quickly the liver breaks down these medications. This could change the effects and side effects of these medications.  Medications changed by the liver (Cytochrome P450 1A2 (CYP1A2) substrates) Some medications are changed and broken down by the liver. CBD might change how quickly the liver breaks down these medications. This could change the effects and side effects of these medications.  Medications changed by the liver (Cytochrome P450 1B1 (CYP1B1) substrates) Some medications are changed and broken down by the liver. CBD  might change how quickly the liver breaks down these medications. This could change the effects and side effects of these medications.  Medications changed by the liver (Cytochrome P450 2A6 (CYP2A6) substrates) Some medications are changed and broken down by the liver. CBD might change how quickly the liver breaks down these medications. This could change the effects and side effects of these medications.  Medications changed by the liver (Cytochrome P450 2B6 (CYP2B6) substrates) Some medications are changed and broken down by the liver. CBD might change how quickly the liver breaks down these medications. This could change the effects and side effects of these medications.  Medications changed by the liver (Cytochrome P450 2C19 (CYP2C19) substrates) Some medications are changed and broken down by the liver. CBD might change how quickly the liver breaks down these medications. This could change the effects and side effects of these medications.  Medications changed by the liver (Cytochrome P450 2C8 (CYP2C8) substrates) Some medications are changed and broken down by the liver. CBD might change how quickly the liver breaks down these medications. This could change the effects and side effects of these medications.  Medications changed by the liver (Cytochrome P450 2C9 (CYP2C9) substrates) Some medications are changed and broken down by the liver. CBD might change how quickly the liver breaks down these medications. This could change the effects and side effects of these medications.  Medications changed by the liver (Cytochrome P450 2D6 (CYP2D6) substrates) Some medications are changed and broken down by the liver. CBD might change how quickly the liver breaks down these medications. This could change the effects and side effects of these medications.  Medications changed by the liver (Cytochrome P450 2E1 (  CYP2E1) substrates) Some medications are changed and broken down by the liver. CBD might  change how quickly the liver breaks down these medications. This could change the effects and side effects of these medications.  Medications changed by the liver (Cytochrome P450 3A4 (CYP3A4) substrates) Some medications are changed and broken down by the liver. CBD might change how quickly the liver breaks down these medications. This could change the effects and side effects of these medications.  Medications changed by the liver (Glucuronidated drugs) Some medications are changed and broken down by the liver. CBD might change how quickly the liver breaks down these medications. This could change the effects and side effects of these medications.  Medications that decrease the breakdown of other medications by the liver (Cytochrome P450 2C19 (CYP2C19) inhibitors) CBD is changed and broken down by the liver. Some drugs decrease how quickly the liver changes and breaks down CBD. This could change the effects and side effects of CBD.  Medications that decrease the breakdown of other medications in the liver (Cytochrome P450 3A4 (CYP3A4) inhibitors) CBD is changed and broken down by the liver. Some drugs decrease how quickly the liver changes and breaks down CBD. This could change the effects and side effects of CBD.  Medications that increase breakdown of other medications by the liver (Cytochrome P450 3A4 (CYP3A4) inducers) CBD is changed and broken down by the liver. Some drugs increase how quickly the liver changes and breaks down CBD. This could change the effects and side effects of CBD.  Medications that increase the breakdown of other medications by the liver (Cytochrome P450 2C19 (CYP2C19) inducers) CBD is changed and broken down by the liver. Some drugs increase how quickly the liver changes and breaks down CBD. This could change the effects and side effects of CBD.  Methadone (Dolophine) Methadone is broken down by the liver. CBD might decrease how quickly the liver breaks down  methadone. Taking cannabidiol along with methadone might increase the effects and side effects of methadone.  Rufinamide (Banzel) Rufinamide is changed and broken down by the body. CBD might decrease how quickly the body breaks down rufinamide. This might increase levels of rufinamide in the body by a small amount.  Sedative medications (CNS depressants) CBD might cause sleepiness and slowed breathing. Some medications, called sedatives, can also cause sleepiness and slowed breathing. Taking CBD with sedative medications might cause breathing problems and/or too much sleepiness.  Sirolimus (Rapamune) Sirolimus is changed and broken down by the body. CBD might decrease how quickly the body breaks down sirolimus. This might increase levels of sirolimus in the body.  Stiripentol (Diacomit) Stiripentol is changed and broken down by the body. CBD might decrease how quickly the body breaks down stiripentol. This might increase levels of stiripentol in the body and increase its side effects.  Tacrolimus (Prograf) Tacrolimus is changed and broken down by the body. CBD might decrease how quickly the body breaks down tacrolimus. This might increase levels of tacrolimus in the body.  Tamoxifen (Soltamox) Tamoxifen is changed and broken down by the body. CBD might affect how quickly the body breaks down tamoxifen. This might affect levels of tamoxifen in the body.  Topiramate (Topamax) Topiramate is changed and broken down by the body. CBD might decrease how quickly the body breaks down topiramate. This might increase levels of topiramate in the body by a small amount.  Valproate Valproic acid can cause liver injury. Taking cannabidiol with valproic acid might increase the chance of liver injury. CBD  and/or valproic acid might need to be stopped, or the dose might need to be reduced.  Warfarin (Coumadin) CBD might increase levels of warfarin, which can increase the risk for bleeding. CBD and/or  warfarin might need to be stopped, or the dose might need to be reduced.  Zonisamide Zonisamide is changed and broken down by the body. CBD might decrease how quickly the body breaks down zonisamide. This might increase levels of zonisamide in the body by a small amount. (Last update: 04/02/2021) ____________________________________________________________________________________________    ____________________________________________________________________________________________  Patient Information update  To: All of our patients.  Re: Name change.  It has been made official that our current name, "[facility]"   will soon be changed to "[facility]".   The purpose of this change is to eliminate any confusion created by the concept of our practice being a "Medication Management Pain Clinic". In the past this has led to the misconception that we treat pain primarily by the use of prescription medications.  Nothing can be farther from the truth.   Understanding PAIN MANAGEMENT: To further understand what our practice does, you first have to understand that "Pain Management" is a subspecialty that requires additional training once a physician has completed their specialty training, which can be in either Anesthesia, Neurology, Psychiatry, or Physical Medicine and Rehabilitation (PMR). Each one of these contributes to the final approach taken by each physician to the management of their patient's pain. To be a "Pain Management Specialist" you must have first completed one of the specialty trainings below.  Anesthesiologists - trained in clinical pharmacology and interventional techniques such as nerve blockade and regional as well as central neuroanatomy. They are trained to block pain before, during, and after surgical interventions.  Neurologists - trained in the diagnosis and  pharmacological treatment of complex neurological conditions, such as Multiple Sclerosis, Parkinson's, spinal cord injuries, and other systemic conditions that may be associated with symptoms that may include but are not limited to pain. They tend to rely primarily on the treatment of chronic pain using prescription medications.  Psychiatrist - trained in conditions affecting the psychosocial wellbeing of patients including but not limited to depression, anxiety, schizophrenia, personality disorders, addiction, and other substance use disorders that may be associated with chronic pain. They tend to rely primarily on the treatment of chronic pain using prescription medications.   Physical Medicine and Rehabilitation (PMR) physicians, also known as physiatrists - trained to treat a wide variety of medical conditions affecting the brain, spinal cord, nerves, bones, joints, ligaments, muscles, and tendons. Their training is primarily aimed at treating patients that have suffered injuries that have caused severe physical impairment. Their training is primarily aimed at the physical therapy and rehabilitation of those patients. They may also work alongside orthopedic surgeons or neurosurgeons using their expertise in assisting surgical patients to recover after their surgeries.  INTERVENTIONAL PAIN MANAGEMENT is sub-subspecialty of Pain Management.  Our physicians are Board-certified in Anesthesia, Pain Management, and Interventional Pain Management.  This meaning that not only have they been trained and Board-certified in their specialty of Anesthesia, and subspecialty of Pain Management, but they have also received further training in the sub-subspecialty of Interventional Pain Management, in order to become Board-certified as INTERVENTIONAL PAIN MANAGEMENT SPECIALIST.    Mission: Our goal is to use our skills in  INTERVENTIONAL PAIN MANAGEMENT as alternatives to the chronic use of prescription opioid  medications for the treatment  of pain. To make this more clear, we have changed our name to reflect what we do and offer. We will continue to offer medication management assessment and recommendations, but we will not be taking over any patient's medication management.  ____________________________________________________________________________________________    ____________________________________________________________________________________________  Naloxone Nasal Spray  Why am I receiving this medication? [place] STOP ACT requires that all patients taking high dose opioids or at risk of opioids respiratory depression, be prescribed an opioid reversal agent, such as Naloxone (AKA: Narcan).  What is this medication? NALOXONE (nal OX one) treats opioid overdose, which causes slow or shallow breathing, severe drowsiness, or trouble staying awake. Call emergency services after using this medication. You may need additional treatment. Naloxone works by reversing the effects of opioids. It belongs to a group of medications called opioid blockers.  COMMON BRAND NAME(S): Kloxxado, Narcan  What should I tell my care team before I take this medication? They need to know if you have any of these conditions: Heart disease Substance use disorder An unusual or allergic reaction to naloxone, other medications, foods, dyes, or preservatives Pregnant or trying to get pregnant Breast-feeding  When to use this medication? This medication is to be used for the treatment of respiratory depression (less than 8 breaths per minute) secondary to opioid overdose.   How to use this medication? This medication is for use in the nose. Lay the person on their back. Support their neck with your hand and allow the head to tilt back before giving the medication. The nasal spray should be given into 1 nostril. After giving the medication, move the person onto their side. Do not remove or test the nasal  spray until ready to use. Get emergency medical help right away after giving the first dose of this medication, even if the person wakes up. You should be familiar with how to recognize the signs and symptoms of a narcotic overdose. If more doses are needed, give the additional dose in the other nostril. Talk to your care team about the use of this medication in children. While this medication may be prescribed for children as young as newborns for selected conditions, precautions do apply.  Naloxone Overdosage: If you think you have taken too much of this medicine contact a poison control center or emergency room at once.  NOTE: This medicine is only for you. Do not share this medicine with others.  What if I miss a dose? This does not apply.  What may interact with this medication? This is only used during an emergency. No interactions are expected during emergency use. This list may not describe all possible interactions. Give your health care provider a list of all the medicines, herbs, non-prescription drugs, or dietary supplements you use. Also tell them if you smoke, drink alcohol, or use illegal drugs. Some items may interact with your medicine.  What should I watch for while using this medication? Keep this medication ready for use in the case of an opioid overdose. Make sure that you have the phone number of your care team and local hospital ready. You may need to have additional doses of this medication. Each nasal spray contains a single dose. Some emergencies may require additional doses. After use, bring the treated person to the nearest hospital or call 911. Make sure the treating care team knows that the person has received a dose of this medication. You will receive additional instructions on what to do during and after use of this medication before  an emergency occurs.  What side effects may I notice from receiving this medication? Side effects that you should report to your  care team as soon as possible: Allergic reactions--skin rash, itching, hives, swelling of the face, lips, tongue, or throat Side effects that usually do not require medical attention (report these to your care team if they continue or are bothersome): Constipation Dryness or irritation inside the nose Headache Increase in blood pressure Muscle spasms Stuffy nose Toothache This list may not describe all possible side effects. Call your doctor for medical advice about side effects. You may report side effects to FDA at 1-800-FDA-1088.  Where should I keep my medication? Because this is an emergency medication, you should keep it with you at all times.  Keep out of the reach of children and pets. Store between 20 and 25 degrees C (68 and 77 degrees F). Do not freeze. Throw away any unused medication after the expiration date. Keep in original box until ready to use.  NOTE: This sheet is a summary. It may not cover all possible information. If you have questions about this medicine, talk to your doctor, pharmacist, or health care provider.   2023 Elsevier/Gold Standard (2020-09-27 00:00:00)  ____________________________________________________________________________________________

## 2022-01-02 ENCOUNTER — Telehealth: Payer: Self-pay

## 2022-01-02 NOTE — Telephone Encounter (Signed)
Post procedure follow up.  Patient states she is doing good, but is a little sore

## 2022-01-02 NOTE — Telephone Encounter (Signed)
Patient called and asked why she did not receive extra pain medication after RFA.  Called Dr Laban Emperor and he ordered Ibuprofen 800 mg tid prn x 1 week.  Did not send script to pharmacy because patient is already taking this and has them at home.  Patient notified.

## 2022-01-14 ENCOUNTER — Ambulatory Visit: Payer: Medicare Other | Admitting: Pain Medicine

## 2022-02-08 NOTE — Progress Notes (Unsigned)
PROVIDER NOTE: Information contained herein reflects review and annotations entered in association with encounter. Interpretation of such information and data should be left to medically-trained personnel. Information provided to patient can be located elsewhere in the medical record under "Patient Instructions". Document created using STT-dictation technology, any transcriptional errors that may result from process are unintentional.    Patient: Holly Spears  Service Category: E/M  Provider: Gaspar Cola, MD  DOB: 08-Sep-1970  DOS: 02/12/2022  Referring Provider: Mike Craze  MRN: [id number]  Specialty: Interventional Pain Management  PCP: Lonzo Cloud, PA-C  Type: Established Patient  Setting: Ambulatory outpatient    Location: Office  Delivery: Face-to-face     HPI  [name], a 52 y.o. year old female, is here today because of her No primary diagnosis found.. [name]'s primary complain today is No chief complaint on file. Last encounter: My last encounter with her was on 01/01/2022. Pertinent problems: 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; Lumbar spondylosis (L3-4, L4-5, and L5-S1); Lumbar bulging disc (L3-4, L4-5, and L5-S1); Chronic lumbar radicular pain (Bilateral) (R>L); Chronic knee pain (3ry area of Pain) (Bilateral) (R>L); Chronic upper back pain (Left); Thoracic disc herniation (Large, Left paracentral T10-11 disc  herniation); Thoracic spinal stenosis (Severe, Left, T10-11 Lateral Recess Stenosis); Thoracic foraminal stenosis (Severe Left T10-11); History of bilateral carpal tunnel release (Bilateral); Chronic hand pain (Bilateral) (R>L); Muscle weakness; Chronic lower extremity pain (2ry area of Pain) (Bilateral); Neck pain; Spasm of back muscles; Myalgia and myositis; Fibromyalgia; Sjogren's syndrome (HCC); DDD (degenerative disc disease), cervical; Right cervical radiculopathy (C8); Abnormal MRI, cervical spine (10/16/2021); Enthesopathy of shoulder (Right); Trigger point of shoulder region (Right); Pain in shoulder region (Right); and Arthropathy of shoulder (Right) on their pertinent problem list. Pain Assessment: Severity of   is reported as a  /10. Location:    / . Onset:  . Quality:  . Timing:  . Modifying factor(s):  Marland Kitchen Vitals:  vitals were not taken for this visit.  BMI: Estimated body mass index is 26.63 kg/m as calculated from the following:   Height as of 01/01/22: 5\' 6"  (1.676 m).   Weight as of 01/01/22: 165 lb (74.8 kg).  Reason for encounter:  *** . ***  Pharmacotherapy Assessment  Analgesic: Oxycodone IR 10 mg, 1 tab PO q 6 hrs (40 mg/day of oxycodone) MME/day: 60 mg/day.   Monitoring: [place] PMP: PDMP reviewed during this encounter.       Pharmacotherapy: No side-effects or adverse reactions reported. Compliance: No problems identified. Effectiveness: Clinically acceptable.  No notes on file  No results found for: "CBDTHCR" No results found for: "D8THCCBX" No results found for: "D9THCCBX"  UDS:  Summary  Date Value Ref Range Status  07/14/2021 Note  Final    Comment:    ==================================================================== [facility] (MW) ==================================================================== Test                             Result       Flag  Units  Drug Present and Declared for Prescription Verification   Oxycodone                       749          EXPECTED   ng/mg creat   Oxymorphone                    2496         EXPECTED   ng/mg creat   Noroxycodone                   6964         EXPECTED   ng/mg creat   Noroxymorphone                 1100         EXPECTED   ng/mg creat    Sources of oxycodone are scheduled prescription medications.    Oxymorphone, noroxycodone, and noroxymorphone are expected    metabolites of oxycodone. Oxymorphone is also available as a    scheduled prescription medication.  ==================================================================== Test                      Result    Flag   Units      Ref Range   Creatinine              47               mg/dL      >=46 ==================================================================== Declared Medications:  The flagging and interpretation on this report are based on the  following declared medications.  Unexpected results may arise from  inaccuracies in the declared medications.   **Note: The testing scope of this panel includes these medications:   Oxycodone   **Note: The testing scope of this panel does not include the  following reported medications:   Biotin  Bupropion (Wellbutrin)  Calcium  Citalopram (Celexa)  Cyclobenzaprine (Flexeril)  Epinephrine (EpiPen)  Famotidine (Pepcid)  Fluoride (Prevident)  Hydroxychloroquine (Plaquenil)  Ibuprofen (Advil)  Levonorgestrel (Mirena)  Levothyroxine (Synthroid)  Magnesium  Multivitamin  Naloxone (Narcan)  Potassium (Klor-Con)  Propranolol (Inderal)  Vitamin D  Vitamin D3  Zinc ==================================================================== For clinical consultation, please call [phone]. ====================================================================       ROS  Constitutional: Denies any fever or chills Gastrointestinal: No reported hemesis, hematochezia, vomiting, or acute GI distress Musculoskeletal: Denies any acute onset joint swelling, redness, loss of  ROM, or weakness Neurological: No reported episodes of acute onset apraxia, aphasia, dysarthria, agnosia, amnesia, paralysis, loss of coordination, or loss of consciousness  Medication Review  Biotin, Calcium Carbonate-Vitamin D, EPINEPHrine, Magnesium, Multi-Vitamins, NIFEdipine, Oxycodone HCl, Sodium Fluoride, Vitamin D3, Zinc, buPROPion, citalopram, cyclobenzaprine, famotidine, hydroxychloroquine, ibuprofen, levothyroxine, naloxone, and potassium chloride SA  History Review  Allergy: [name] is allergic to bee venom, penicillins, shellfish allergy, sulfa antibiotics, erythromycin, zaleplon, codeine, and trazodone. Drug: [name]  reports no history of drug use. Alcohol:  reports no history of alcohol use. Tobacco:  reports that she has quit smoking. She has never used smokeless tobacco. Social: [name]  reports that she has quit smoking. She has never used smokeless tobacco. She reports that she does not drink alcohol and does not use drugs. Medical:  has a past medical history of Acid reflux (12/15/2011), Anxiety, generalized (12/15/2011), Arthritis, Cervical pain (12/15/2011), Depression, GERD (gastroesophageal reflux disease), Hiatal hernia, LBP (low back pain) (12/15/2011), Migraine, Muscle ache (  12/15/2011), Osteopenia of left hip, Pain (12/27/2012), Rheumatoid arthritis (HCC) (12/15/2011), and Scratched cornea (10/25/2015). Surgical: [name]  has a past surgical history that includes Cholecystectomy and Abdominal hysterectomy. Family: family history includes Depression in her mother; Diabetes in her father; Hyperlipidemia in her mother; Hypertension in her father and mother.  Laboratory Chemistry Profile   Renal Lab Results  Component Value Date   BUN 6 03/03/2018   CREATININE 0.63 03/03/2018   BCR 10 03/03/2018   GFRAA 123 03/03/2018   GFRNONAA 106 03/03/2018    Hepatic Lab Results  Component Value Date   AST 21 03/03/2018   ALT 26 03/27/2015   ALBUMIN 4.6  03/03/2018   ALKPHOS 71 03/03/2018    Electrolytes Lab Results  Component Value Date   NA 136 03/03/2018   K 3.9 03/03/2018   CL 97 03/03/2018   CALCIUM 8.9 03/03/2018   MG 2.1 03/03/2018    Bone Lab Results  Component Value Date   VD25OH 29.3 (L) 12/31/2014    Inflammation (CRP: Acute Phase) (ESR: Chronic Phase) Lab Results  Component Value Date   CRP <1 03/03/2018   ESRSEDRATE 2 03/03/2018         Note: Above Lab results reviewed.  Recent Imaging Review  DG PAIN CLINIC C-ARM 1-60 MIN NO REPORT Fluoro was used, but no Radiologist interpretation will be provided.  Please refer to "NOTES" tab for provider progress note. Note: Reviewed        Physical Exam  General appearance: Well nourished, well developed, and well hydrated. In no apparent acute distress Mental status: Alert, oriented x 3 (person, place, & time)       Respiratory: No evidence of acute respiratory distress Eyes: PERLA Vitals: There were no vitals taken for this visit. BMI: Estimated body mass index is 26.63 kg/m as calculated from the following:   Height as of 01/01/22: 5\' 6"  (1.676 m).   Weight as of 01/01/22: 165 lb (74.8 kg). Ideal: Patient weight not recorded  Assessment   Diagnosis Status  No diagnosis found. Controlled Controlled Controlled   Updated Problems: No problems updated.  Plan of Care  Problem-specific:  No problem-specific Assessment & Plan notes found for this encounter.  [name] has a current medication list which includes the following long-term medication(s): bupropion, calcium carbonate-vitamin d, citalopram, cyclobenzaprine, famotidine, levothyroxine, naloxone, nifedipine, oxycodone hcl, [START ON 02/17/2022] oxycodone hcl, and [START ON 03/19/2022] oxycodone hcl.  Pharmacotherapy (Medications Ordered): No orders of the defined types were placed in this encounter.  Orders:  No orders of the defined types were placed in this encounter.  Follow-up plan:    No follow-ups on file.     Interventional Therapies  Risk Factors  Complex Considerations:   The patient had a bad experience with another physician receiving blocks.   Planned  Pending:   Therapeutic right suprascapular nerve RFA #1 (01/01/2022)    Under consideration:   Diagnostic bilateral lumbar facet block  Possible bilateral lumbar facet RFA  Diagnostic right L5-S1 LESI  Possible right L5 vs S1 TFESI  Diagnostic bilateral IA knee injections (w/ steroid)  Possible series of 5 IA Hyalgan knee injections  Diagnostic bilateral Genicular NB  Possible bilateral Genicular nerve RFA    Completed:   Diagnostic right suprascapular NB x1 (11/25/2021) (100/80/80/80)    Therapeutic  Palliative (PRN) options:   None.   Pharmacotherapy:  Nonopioids transferred 11/22/2019: Flexeril Recommendations:   None at this time.      Recent Visits Date  Type Provider Dept  01/01/22 Procedure visit Delano Metz, MD [facility]  12/18/21 Office Visit Delano Metz, MD [facility]  11/25/21 Procedure visit Delano Metz, MD [facility]  Showing recent visits within past 90 days and meeting all other requirements Future Appointments Date Type Provider Dept  02/12/22 Appointment Delano Metz, MD [facility]  04/08/22 Appointment Delano Metz, MD [facility]  Showing future appointments within next 90 days and meeting all other requirements  I discussed the assessment and treatment plan with the patient. The patient was provided an opportunity to ask questions and all were answered. The patient agreed with the plan and demonstrated an understanding of the instructions.  Patient advised to call back or seek an in-person evaluation if the symptoms or condition worsens.  Duration of encounter: *** minutes.  Total time on encounter, as per AMA guidelines included both the face-to-face and non-face-to-face time  personally spent by the physician and/or other qualified health care professional(s) on the day of the encounter (includes time in activities that require the physician or other qualified health care professional and does not include time in activities normally performed by clinical staff). Physician's time may include the following activities when performed: Preparing to see the patient (e.g., pre-charting review of records, searching for previously ordered imaging, lab work, and nerve conduction tests) Review of prior analgesic pharmacotherapies. Reviewing PMP Interpreting ordered tests (e.g., lab work, imaging, nerve conduction tests) Performing post-procedure evaluations, including interpretation of diagnostic procedures Obtaining and/or reviewing separately obtained history Performing a medically appropriate examination and/or evaluation Counseling and educating the patient/family/caregiver Ordering medications, tests, or procedures Referring and communicating with other health care professionals (when not separately reported) Documenting clinical information in the electronic or other health record Independently interpreting results (not separately reported) and communicating results to the patient/ family/caregiver Care coordination (not separately reported)  Note by: Oswaldo Done, MD Date: 02/12/2022; Time: 6:37 PM

## 2022-02-12 ENCOUNTER — Encounter: Payer: Self-pay | Admitting: Pain Medicine

## 2022-02-12 ENCOUNTER — Ambulatory Visit: Payer: 59 | Attending: Pain Medicine | Admitting: Pain Medicine

## 2022-02-12 VITALS — BP 139/98 | HR 110 | Temp 97.3°F | Ht 66.0 in | Wt 170.0 lb

## 2022-02-12 DIAGNOSIS — G8929 Other chronic pain: Secondary | ICD-10-CM | POA: Diagnosis present

## 2022-02-12 DIAGNOSIS — M25511 Pain in right shoulder: Secondary | ICD-10-CM | POA: Diagnosis present

## 2022-02-12 DIAGNOSIS — G894 Chronic pain syndrome: Secondary | ICD-10-CM | POA: Diagnosis present

## 2022-02-12 DIAGNOSIS — M25512 Pain in left shoulder: Secondary | ICD-10-CM

## 2022-02-12 DIAGNOSIS — M542 Cervicalgia: Secondary | ICD-10-CM

## 2022-02-12 NOTE — Progress Notes (Signed)
Safety precautions to be maintained throughout the outpatient stay will include: orient to surroundings, keep bed in low position, maintain call bell within reach at all times, provide assistance with transfer out of bed and ambulation.  

## 2022-02-12 NOTE — Patient Instructions (Signed)
____________________________________________________________________________________________  Patient Information update  To: All of our patients.  Re: Name change.  It has been made official that our current name, "[facility]"   will soon be changed to "[facility]".   The purpose of this change is to eliminate any confusion created by the concept of our practice being a "Medication Management Pain Clinic". In the past this has led to the misconception that we treat pain primarily by the use of prescription medications.  Nothing can be farther from the truth.   Understanding PAIN MANAGEMENT: To further understand what our practice does, you first have to understand that "Pain Management" is a subspecialty that requires additional training once a physician has completed their specialty training, which can be in either Anesthesia, Neurology, Psychiatry, or Physical Medicine and Rehabilitation (PMR). Each one of these contributes to the final approach taken by each physician to the management of their patient's pain. To be a "Pain Management Specialist" you must have first completed one of the specialty trainings below.  Anesthesiologists - trained in clinical pharmacology and interventional techniques such as nerve blockade and regional as well as central neuroanatomy. They are trained to block pain before, during, and after surgical interventions.  Neurologists - trained in the diagnosis and pharmacological treatment of complex neurological conditions, such as Multiple Sclerosis, Parkinson's, spinal cord injuries, and other systemic conditions that may be associated with symptoms that may include but are not limited to pain. They tend to rely primarily on the treatment of chronic pain using prescription medications.  Psychiatrist - trained in conditions affecting the psychosocial  wellbeing of patients including but not limited to depression, anxiety, schizophrenia, personality disorders, addiction, and other substance use disorders that may be associated with chronic pain. They tend to rely primarily on the treatment of chronic pain using prescription medications.   Physical Medicine and Rehabilitation (PMR) physicians, also known as physiatrists - trained to treat a wide variety of medical conditions affecting the brain, spinal cord, nerves, bones, joints, ligaments, muscles, and tendons. Their training is primarily aimed at treating patients that have suffered injuries that have caused severe physical impairment. Their training is primarily aimed at the physical therapy and rehabilitation of those patients. They may also work alongside orthopedic surgeons or neurosurgeons using their expertise in assisting surgical patients to recover after their surgeries.  INTERVENTIONAL PAIN MANAGEMENT is sub-subspecialty of Pain Management.  Our physicians are Board-certified in Anesthesia, Pain Management, and Interventional Pain Management.  This meaning that not only have they been trained and Board-certified in their specialty of Anesthesia, and subspecialty of Pain Management, but they have also received further training in the sub-subspecialty of Interventional Pain Management, in order to become Board-certified as INTERVENTIONAL PAIN MANAGEMENT SPECIALIST.    Mission: Our goal is to use our skills in  INTERVENTIONAL PAIN MANAGEMENT as alternatives to the chronic use of prescription opioid medications for the treatment of pain. To make this more clear, we have changed our name to reflect what we do and offer. We will continue to offer medication management assessment and recommendations, but we will not be taking over any patient's medication management.  ____________________________________________________________________________________________      ____________________________________________________________________________________________  National Pain Medication Shortage  [place] is experiencing worsening drug shortages. These have had a negative widespread effect on patient care and treatment. Not expected to improve any time soon. Predicted to last past 2029.   Drug shortage list (generic   names) Oxycodone IR Oxycodone/APAP Oxymorphone IR Hydromorphone Hydrocodone/APAP Morphine  Where is the problem?  Manufacturing and supply level.  Will this shortage affect you?  Only if you take any of the above pain medications.  How? You may be unable to fill your prescription.  Your pharmacist may offer a "partial fill" of your prescription. (Warning: Do not accept partial fills.) Prescriptions partially filled cannot be transferred to another pharmacy. Read our Medication Rules and Regulation. Depending on how much medicine you are dependent on, you may experience withdrawals when unable to get the medication.  Recommendations: Consider ending your dependence on opioid pain medications. Ask your pain specialist to assist you with the process. Consider switching to a medication currently not in shortage, such as Buprenorphine. Talk to your pain specialist about this option. Consider decreasing your pain medication requirements by managing tolerance thru "Drug Holidays". This may help minimize withdrawals, should you run out of medicine. Control your pain thru the use of non-pharmacological interventional therapies.   Your prescriber: Prescribers cannot be blamed for shortages. Medication manufacturing and supply issues cannot be fixed by the prescriber.   NOTE: The prescriber is not responsible for supplying the medication, or solving supply issues. Work with your pharmacist to solve it. The patient is responsible for the decision to take or continue taking the medication and for identifying and securing a legal supply source. By  law, supplying the medication is the job and responsibility of the pharmacy. The prescriber is responsible for the evaluation, monitoring, and prescribing of these medications.   Prescribers will NOT: Re-issue prescriptions that have been partially filled. Re-issue prescriptions already sent to a pharmacy.  Re-send prescriptions to a different pharmacy because yours did not have your medication. Ask pharmacist to order more medicine or transfer the prescription to another pharmacy. (Read below.)  New 2023 regulation: "October 03, 2021 Revised Regulation Allows DEA-Registered Pharmacies to Transfer Electronic Prescriptions at a Patient's Request DEA Headquarters Division - Public Information Office Patients now have the ability to request their electronic prescription be transferred to another pharmacy without having to go back to their practitioner to initiate the request. This revised regulation went into effect on Monday, September 29, 2021.     At a patient's request, a DEA-registered retail pharmacy can now transfer an electronic prescription for a controlled substance (schedules II-V) to another DEA-registered retail pharmacy. Prior to this change, patients would have to go through their practitioner to cancel their prescription and have it re-issued to a different pharmacy. The process was taxing and time consuming for both patients and practitioners.    The Drug Enforcement Administration (DEA) published its intent to revise the process for transferring electronic prescriptions on December 22, 2019.  The final rule was published in the federal register on August 28, 2021 and went into effect 30 days later.  Under the final rule, a prescription can only be transferred once between pharmacies, and only if allowed under existing state or other applicable law. The prescription must remain in its electronic form; may not be altered in any way; and the transfer must be communicated directly between  two licensed pharmacists. It's important to note, any authorized refills transfer with the original prescription, which means the entire prescription will be filled at the same pharmacy".  Reference: https://www.dea.gov/stories/2023/2023-10/2021-09-01/revised-regulation-allows-dea-registered-pharmacies-transfer (DEA website announcement)  https://www.govinfo.gov/content/pkg/FR-2021-08-28/pdf/2023-15847.pdf (Federal Register  Department of Justice)   Federal Register / Vol. 88, No. 143 / Thursday, August 28, 2021 / Rules and Regulations DEPARTMENT OF JUSTICE  Drug Enforcement   Administration  21 CFR Part 1306  [Docket No. [id number]]  RIN 1117-AB64 Transfer of Electronic Prescriptions for Schedules II-V Controlled Substances Between Pharmacies for Initial Filling  ____________________________________________________________________________________________     

## 2022-04-07 NOTE — Patient Instructions (Signed)
____________________________________________________________________________________________  Patient Information update  To: All of our patients.  Re: Name change.  It has been made official that our current name, "[facility]"   will soon be changed to "[facility]".   The purpose of this change is to eliminate any confusion created by the concept of our practice being a "Medication Management Pain Clinic". In the past this has led to the misconception that we treat pain primarily by the use of prescription medications.  Nothing can be farther from the truth.   Understanding PAIN MANAGEMENT: To further understand what our practice does, you first have to understand that "Pain Management" is a subspecialty that requires additional training once a physician has completed their specialty training, which can be in either Anesthesia, Neurology, Psychiatry, or Physical Medicine and Rehabilitation (PMR). Each one of these contributes to the final approach taken by each physician to the management of their patient's pain. To be a "Pain Management Specialist" you must have first completed one of the specialty trainings below.  Anesthesiologists - trained in clinical pharmacology and interventional techniques such as nerve blockade and regional as well as central neuroanatomy. They are trained to block pain before, during, and after surgical interventions.  Neurologists - trained in the diagnosis and pharmacological treatment of complex neurological conditions, such as Multiple Sclerosis, Parkinson's, spinal cord injuries, and other systemic conditions that may be associated with symptoms that may include but are not limited to pain. They tend to rely primarily on the treatment of chronic pain using prescription medications.  Psychiatrist - trained in conditions affecting the psychosocial  wellbeing of patients including but not limited to depression, anxiety, schizophrenia, personality disorders, addiction, and other substance use disorders that may be associated with chronic pain. They tend to rely primarily on the treatment of chronic pain using prescription medications.   Physical Medicine and Rehabilitation (PMR) physicians, also known as physiatrists - trained to treat a wide variety of medical conditions affecting the brain, spinal cord, nerves, bones, joints, ligaments, muscles, and tendons. Their training is primarily aimed at treating patients that have suffered injuries that have caused severe physical impairment. Their training is primarily aimed at the physical therapy and rehabilitation of those patients. They may also work alongside orthopedic surgeons or neurosurgeons using their expertise in assisting surgical patients to recover after their surgeries.  INTERVENTIONAL PAIN MANAGEMENT is sub-subspecialty of Pain Management.  Our physicians are Board-certified in Anesthesia, Pain Management, and Interventional Pain Management.  This meaning that not only have they been trained and Board-certified in their specialty of Anesthesia, and subspecialty of Pain Management, but they have also received further training in the sub-subspecialty of Interventional Pain Management, in order to become Board-certified as INTERVENTIONAL PAIN MANAGEMENT SPECIALIST.    Mission: Our goal is to use our skills in  [place] as alternatives to the chronic use of prescription opioid medications for the treatment of pain. To make this more clear, we have changed our name to reflect what we do and offer. We will continue to offer medication management assessment and recommendations, but we will not be taking over any patient's medication management.  ____________________________________________________________________________________________      ____________________________________________________________________________________________  Opioid Pain Medication Update  To: All patients taking opioid pain medications. (I.e.: hydrocodone, hydromorphone, oxycodone, oxymorphone, morphine, codeine, methadone, tapentadol, tramadol, buprenorphine, fentanyl, etc.)  Re: Updated review of side effects and adverse reactions of opioid analgesics, as well as new  information about long term effects of this class of medications.  Direct risks of long-term opioid therapy are not limited to opioid addiction and overdose. Potential medical risks include serious fractures, breathing problems during sleep, hyperalgesia, immunosuppression, chronic constipation, bowel obstruction, myocardial infarction, and tooth decay secondary to xerostomia.  Unpredictable adverse effects that can occur even if you take your medication correctly: Cognitive impairment, respiratory depression, and death. Most people think that if they take their medication "correctly", and "as instructed", that they will be safe. Nothing could be farther from the truth. In reality, a significant amount of recorded deaths associated with the use of opioids has occurred in individuals that had taken the medication for a long time, and were taking their medication correctly. The following are examples of how this can happen: Patient taking his/her medication for a long time, as instructed, without any side effects, is given a certain antibiotic or another unrelated medication, which in turn triggers a "Drug-to-drug interaction" leading to disorientation, cognitive impairment, impaired reflexes, respiratory depression or an untoward event leading to serious bodily harm or injury, including death.  Patient taking his/her medication for a long time, as instructed, without any side effects, develops an acute impairment of liver and/or kidney function. This will lead to a rapid inability of the body to  breakdown and eliminate their pain medication, which will result in effects similar to an "overdose", but with the same medicine and dose that they had always taken. This again may lead to disorientation, cognitive impairment, impaired reflexes, respiratory depression or an untoward event leading to serious bodily harm or injury, including death.  A similar problem will occur with patients as they grow older and their liver and kidney function begins to decrease as part of the aging process.  Background information: Historically, the original case for using long-term opioid therapy to treat chronic noncancer pain was based on safety assumptions that subsequent experience has called into question. In 1996, the American Pain Society and the [place] Academy of [employer] Medicine issued a consensus statement supporting long-term opioid therapy. This statement acknowledged the dangers of opioid prescribing but concluded that the risk for addiction was low; respiratory depression induced by opioids was short-lived, occurred mainly in opioid-naive patients, and was antagonized by pain; tolerance was not a common problem; and efforts to control diversion should not constrain opioid prescribing. This has now proven to be wrong. Experience regarding the risks for opioid addiction, misuse, and overdose in community practice has failed to support these assumptions.  According to the Centers for Disease Control and Prevention, fatal overdoses involving opioid analgesics have increased sharply over the past decade. Currently, more than 96,700 people die from drug overdoses every year. Opioids are a factor in 7 out of every 10 overdose deaths. Deaths from drug overdose have surpassed motor vehicle accidents as the leading cause of death for individuals between the ages of 25 and 73.  Clinical data suggest that neuroendocrine dysfunction may be very common in both men and women, potentially causing hypogonadism, erectile  dysfunction, infertility, decreased libido, osteoporosis, and depression. Recent studies linked higher opioid dose to increased opioid-related mortality. Controlled observational studies reported that long-term opioid therapy may be associated with increased risk for cardiovascular events. Subsequent meta-analysis concluded that the safety of long-term opioid therapy in elderly patients has not been proven.   Side Effects and adverse reactions: Common side effects: Drowsiness (sedation). Dizziness. Nausea and vomiting. Constipation. Physical dependence -- Dependence often manifests with withdrawal symptoms when opioids are discontinued  or decreased. Tolerance -- As you take repeated doses of opioids, you require increased medication to experience the same effect of pain relief. Respiratory depression -- This can occur in healthy people, especially with higher doses. However, people with COPD, asthma or other lung conditions may be even more susceptible to fatal respiratory impairment.  Uncommon side effects: An increased sensitivity to feeling pain and extreme response to pain (hyperalgesia). Chronic use of opioids can lead to this. Delayed gastric emptying (the process by which the contents of your stomach are moved into your small intestine). Muscle rigidity. Immune system and hormonal dysfunction. Quick, involuntary muscle jerks (myoclonus). Arrhythmia. Itchy skin (pruritus). Dry mouth (xerostomia).  Long-term side effects: Chronic constipation. Sleep-disordered breathing (SDB). Increased risk of bone fractures. Hypothalamic-pituitary-adrenal dysregulation. Increased risk of overdose.  RISKS: Fractures and Falls:  Opioids increase the risk and incidence of falls. This is of particular importance in elderly patients.  Endocrine System:  Long-term administration is associated with endocrine abnormalities (endocrinopathies). (Also known as Opioid-induced Endocrinopathy) Influences  on both the hypothalamic-pituitary-adrenal axis?and the hypothalamic-pituitary-gonadal axis have been demonstrated with consequent hypogonadism and adrenal insufficiency in both sexes. Hypogonadism and decreased levels of dehydroepiandrosterone sulfate have been reported in men and women. Endocrine effects include: Amenorrhoea in women (abnormal absence of menstruation) Reduced libido in both sexes Decreased sexual function Erectile dysfunction in men Hypogonadisms (decreased testicular function with shrinkage of testicles) Infertility Depression and fatigue Loss of muscle mass Anxiety Depression Immune suppression Hyperalgesia Weight gain Anemia Osteoporosis Patients (particularly women of childbearing age) should avoid opioids. There is insufficient evidence to recommend routine monitoring of asymptomatic patients taking opioids in the long-term for hormonal deficiencies.  Immune System: Human studies have demonstrated that opioids have an immunomodulating effect. These effects are mediated via opioid receptors both on immune effector cells and in the central nervous system. Opioids have been demonstrated to have adverse effects on antimicrobial response and anti-tumour surveillance. Buprenorphine has been demonstrated to have no impact on immune function.  Opioid Induced Hyperalgesia: Human studies have demonstrated that prolonged use of opioids can lead to a state of abnormal pain sensitivity, sometimes called opioid induced hyperalgesia (OIH). Opioid induced hyperalgesia is not usually seen in the absence of tolerance to opioid analgesia. Clinically, hyperalgesia may be diagnosed if the patient on long-term opioid therapy presents with increased pain. This might be qualitatively and anatomically distinct from pain related to disease progression or to breakthrough pain resulting from development of opioid tolerance. Pain associated with hyperalgesia tends to be more diffuse than the  pre-existing pain and less defined in quality. Management of opioid induced hyperalgesia requires opioid dose reduction.  Cancer: Chronic opioid therapy has been associated with an increased risk of cancer among noncancer patients with chronic pain. This association was more evident in chronic strong opioid users. Chronic opioid consumption causes significant pathological changes in the small intestine and colon. Epidemiological studies have found that there is a link between opium dependence and initiation of gastrointestinal cancers. Cancer is the second leading cause of death after cardiovascular disease. Chronic use of opioids can cause multiple conditions such as GERD, immunosuppression and renal damage as well as carcinogenic effects, which are associated with the incidence of cancers.   Mortality: Long-term opioid use has been associated with increased mortality among patients with chronic non-cancer pain (CNCP).  Prescription of long-acting opioids for chronic noncancer pain was associated with a significantly increased risk of all-cause mortality, including deaths from causes other than overdose.  Reference: [place] Hew,  Kolodny A, Deyo RA, Chou R. Long-term opioid therapy reconsidered. Ann Intern Med. 2011 Sep 6;155(5):325-8. doi: 10.7326/[id number]. PMID: [id number]; PMCIDXX:1631110. Morley Kos, Hayward RA, Dunn KM, [place] KP. Risk of adverse events in patients prescribed long-term opioids: A cohort study in the [place] Clinical Practice Research D[employer]. Eur J Pain. 2019 May;23(5):908-922. doi: 10.1002/ejp.1357. Epub 2019 Jan 31. PMID: [id number]. Colameco S, Coren JS, Ciervo CA. Continuous opioid treatment for chronic noncancer pain: a time for moderation in prescribing. Postgrad Med. 2009 Jul;121(4):61-6. doi: 10.3810/pgm.2009.07.2032. PMID: [id number]. Heywood Bene RN, Osakis SD, Blazina I, Rosalio Loud, Bougatsos C, Deyo RA. The  effectiveness and risks of long-term opioid therapy for chronic pain: a systematic review for a [employer] of Health Pathways to [employer]. Ann Intern Med. 2015 Feb 17;162(4):276-86. doi: [id number]. PMID: [id number]. Marjory Sneddon [facility], Makuc DM. NCHS Data Brief No. 22. [place]: Centers for Disease Control and Prevention; 2009. Sep, Increase in Fatal Poisonings Involving Opioid Analgesics in the [place], 1999-2006. Song IA, Choi HR, Oh TK. Long-term opioid use and mortality in patients with chronic non-cancer pain: Ten-year follow-up study in [place] from 2010 through 2019. EClinicalMedicine. 2022 Jul 18;51:101558. doi: 10.1016/j.eclinm.2022.UB:[id number]. PMID: [id number]; PMCIDOX:8550940. Huser, W., Schubert, T., Vogelmann, T. et al. All-cause mortality in patients with long-term opioid therapy compared with non-opioid analgesics for chronic non-cancer pain: a database study. [place] Med 18, 162 (2020). [email or web address] Rashidian H, Roxy Cedar, Malekzadeh R, Haghdoost AA. An Ecological Study of the Association between Opiate Use and Incidence of Cancers. Addict Health. 2016 Fall;8(4):252-260. PMID: [id number]; PMCIDQI:9185013.  Our Goal: Our goal is to control your pain with means other than the use of opioid pain medications.  Our Recommendation: Talk to your physician about coming off of these medications. We can assist you with the tapering down and stopping these medicines. Based on the new information, even if you cannot completely stop the medication, a decrease in the dose may be associated with a lesser risk. Ask for other means of controlling the pain. Decrease or eliminate those factors that significantly contribute to your pain such as smoking, obesity, and a diet heavily tilted towards "inflammatory" nutrients.  Last Updated: 04/01/2022    ____________________________________________________________________________________________     ____________________________________________________________________________________________  National Pain Medication Shortage  The [place] is experiencing worsening drug shortages. These have had a negative widespread effect on patient care and treatment. Not expected to improve any time soon. Predicted to last past 2029.   Drug shortage list (generic names) Oxycodone IR Oxycodone/APAP Oxymorphone IR Hydromorphone Hydrocodone/APAP Morphine  Where is the problem?  Manufacturing and supply level.  Will this shortage affect you?  Only if you take any of the above pain medications.  How? You may be unable to fill your prescription.  Your pharmacist may offer a "partial fill" of your prescription. (Warning: Do not accept partial fills.) Prescriptions partially filled cannot be transferred to another pharmacy. Read our Medication Rules and Regulation. Depending on how much medicine you are dependent on, you may experience withdrawals when unable to get the medication.  Recommendations: Consider ending your dependence on opioid pain medications. Ask your pain specialist to assist you with the process. Consider switching to a medication currently not in shortage, such as Buprenorphine. Talk to your pain specialist about this option. Consider decreasing your pain medication requirements by managing tolerance thru "Drug Holidays". This may help minimize withdrawals, should you run out of medicine. Control your pain thru  the use of non-pharmacological interventional therapies.   Your prescriber: Prescribers cannot be blamed for shortages. Medication manufacturing and supply issues cannot be fixed by the prescriber.   NOTE: The prescriber is not responsible for supplying the medication, or solving supply issues. Work with your pharmacist to solve it. The patient is responsible for the decision  to take or continue taking the medication and for identifying and securing a legal supply source. By law, supplying the medication is the job and responsibility of the pharmacy. The prescriber is responsible for the evaluation, monitoring, and prescribing of these medications.   Prescribers will NOT: Re-issue prescriptions that have been partially filled. Re-issue prescriptions already sent to a pharmacy.  Re-send prescriptions to a different pharmacy because yours did not have your medication. Ask pharmacist to order more medicine or transfer the prescription to another pharmacy. (Read below.)  New 2023 regulation: "October 03, 2021 Revised Regulation Allows DEA-Registered Pharmacies to Transfer Electronic Prescriptions at a Patient's Request [place] Patients now have the ability to request their electronic prescription be transferred to another pharmacy without having to go back to their practitioner to initiate the request. This revised regulation went into effect on Monday, September 29, 2021.     At a patient's request, a DEA-registered retail pharmacy can now transfer an electronic prescription for a controlled substance (schedules II-V) to another DEA-registered retail pharmacy. Prior to this change, patients would have to go through their practitioner to cancel their prescription and have it re-issued to a different pharmacy. The process was taxing and time consuming for both patients and practitioners.    The Drug Enforcement Administration [facility]) published its intent to revise the process for transferring electronic prescriptions on December 22, 2019.  The final rule was published in the federal register on August 28, 2021 and went into effect 30 days later.  Under the final rule, a prescription can only be transferred once between pharmacies, and only if allowed under existing state or other applicable law. The prescription must remain in its  electronic form; may not be altered in any way; and the transfer must be communicated directly between two licensed pharmacists. It's important to note, any authorized refills transfer with the original prescription, which means the entire prescription will be filled at the same pharmacy".  Reference: [email or web address] [facility] website announcement)  [email or web address] ([place])   [employer] / Vol. 88, No. 143 / Thursday, August 28, 2021 / Rules and Regulations DEPARTMENT OF JUSTICE  Drug Enforcement Administration  21 CFR Part 1306  [Docket No. [id number]]  RIN [id number] Transfer of Electronic Prescriptions for Schedules II-V Controlled Substances Between Pharmacies for Initial Filling  ____________________________________________________________________________________________     ____________________________________________________________________________________________  Transfer of Pain Medication between Pharmacies  Re: 2023 DEA Clarification on existing regulation  Published on DEA Website: October 03, 2021  Title: Revised Regulation Allows DEA-Registered Pharmacies to Conservator, museum/gallery Prescriptions at a Patient's Request [place]  "Patients now have the ability to request their electronic prescription be transferred to another pharmacy without having to go back to their practitioner to initiate the request. This revised regulation went into effect on Monday, September 29, 2021.     At a patient's request, a DEA-registered retail pharmacy can now transfer an electronic prescription for a controlled substance (schedules II-V) to another DEA-registered retail pharmacy. Prior to this change, patients would have to go through their practitioner to cancel their  prescription  and have it re-issued to a different pharmacy. The process was taxing and time consuming for both patients and practitioners.    The Drug Enforcement Administration [facility]) published its intent to revise the process for transferring electronic prescriptions on December 22, 2019.  The final rule was published in the federal register on August 28, 2021 and went into effect 30 days later.  Under the final rule, a prescription can only be transferred once between pharmacies, and only if allowed under existing state or other applicable law. The prescription must remain in its electronic form; may not be altered in any way; and the transfer must be communicated directly between two licensed pharmacists. It's important to note, any authorized refills transfer with the original prescription, which means the entire prescription will be filled at the same pharmacy."    REFERENCES: 1. DEA website announcement [email or web address]  2. Department of Justice website  [email or web address]  3. DEPARTMENT OF JUSTICE Drug Enforcement Administration 21 CFR Part 1306 [Docket No. [id number]-637] RIN [id number] "Transfer of Electronic Prescriptions for Schedules II-V Controlled Substances Between Pharmacies for Initial Filling"  ____________________________________________________________________________________________     _______________________________________________________________________  Medication Rules  Purpose: To inform patients, and their family members, of our medication rules and regulations.  Applies to: All patients receiving prescriptions from our practice (written or electronic).  Pharmacy of record: This is the pharmacy where your electronic prescriptions will be sent. Make sure we have the correct one.  Electronic prescriptions: In compliance  with the [place] (STOP) Act of 2017 (Session Lanny Cramp [id number]), effective February 02, 2018, all controlled substances must be electronically prescribed. Written prescriptions, faxing, or calling prescriptions to a pharmacy will no longer be done.  Prescription refills: These will be provided only during in-person appointments. No medications will be renewed without a "face-to-face" evaluation with your provider. Applies to all prescriptions.  NOTE: The following applies primarily to controlled substances (Opioid* Pain Medications).   Type of encounter (visit): For patients receiving controlled substances, face-to-face visits are required. (Not an option and not up to the patient.)  Patient's responsibilities: Pain Pills: Bring all pain pills to every appointment (except for procedure appointments). Pill Bottles: Bring pills in original pharmacy bottle. Bring bottle, even if empty. Always bring the bottle of the most recent fill.  Medication refills: You are responsible for knowing and keeping track of what medications you are taking and when is it that you will need a refill. The day before your appointment: write a list of all prescriptions that need to be refilled. The day of the appointment: give the list to the admitting nurse. Prescriptions will be written only during appointments. No prescriptions will be written on procedure days. If you forget a medication: it will not be "Called in", "Faxed", or "electronically sent". You will need to get another appointment to get these prescribed. No early refills. Do not call asking to have your prescription filled early. Partial  or short prescriptions: Occasionally your pharmacy may not have enough pills to fill your prescription.  NEVER ACCEPT a partial fill or a prescription that is short of the total amount of pills that you were prescribed.  With controlled substances the law allows 72 hours for the pharmacy  to complete the prescription.  If the prescription is not completed within 72 hours, the pharmacist will require a new prescription to be written. This means that you will be short on your medicine and we WILL NOT send another prescription to complete your original  prescription.  Instead, request the pharmacy to send a carrier to a nearby branch to get enough medication to provide you with your full prescription. Prescription Accuracy: You are responsible for carefully inspecting your prescriptions before leaving our office. Have the discharge nurse carefully go over each prescription with you, before taking them home. Make sure that your name is accurately spelled, that your address is correct. Check the name and dose of your medication to make sure it is accurate. Check the number of pills, and the written instructions to make sure they are clear and accurate. Make sure that you are given enough medication to last until your next medication refill appointment. Taking Medication: Take medication as prescribed. When it comes to controlled substances, taking less pills or less frequently than prescribed is permitted and encouraged. Never take more pills than instructed. Never take the medication more frequently than prescribed.  Inform other Doctors: Always inform, all of your healthcare providers, of all the medications you take. Pain Medication from other Providers: You are not allowed to accept any additional pain medication from any other Doctor or Healthcare provider. There are two exceptions to this rule. (see below) In the event that you require additional pain medication, you are responsible for notifying us, as stated below. Cough Medicine: Often these contain an opioid, such as codeine or hydrocodone. Never accept or take cough medicine containing these opioids if you are already taking an opioid* medication. The combination may cause respiratory failure and death. Medication Agreement: You are  responsible for carefully reading and following our Medication Agreement. This must be signed before receiving any prescriptions from our practice. Safely store a copy of your signed Agreement. Violations to the Agreement will result in no further prescriptions. (Additional copies of our Medication Agreement are available upon request.) Laws, Rules, & Regulations: All patients are expected to follow all Federal and [employer], [employer], Rules, [facility]. Ignorance of the Laws does not constitute a valid excuse.  Illegal drugs and Controlled Substances: The use of illegal substances (including, but not limited to marijuana and its derivatives) and/or the illegal use of any controlled substances is strictly prohibited. Violation of this rule may result in the immediate and permanent discontinuation of any and all prescriptions being written by our practice. The use of any illegal substances is prohibited. Adopted CDC guidelines & recommendations: Target dosing levels will be at or below 60 MME/day. Use of benzodiazepines** is not recommended.  Exceptions: There are only two exceptions to the rule of not receiving pain medications from other Healthcare Providers. Exception #1 (Emergencies): In the event of an emergency (i.e.: accident requiring emergency care), you are allowed to receive additional pain medication. However, you are responsible for: As soon as you are able, call our office [phone], at any time of the day or night, and leave a message stating your name, the date and nature of the emergency, and the name and dose of the medication prescribed. In the event that your call is answered by a member of our staff, make sure to document and save the date, time, and the name of the person that took your information.  Exception #2 (Planned Surgery): In the event that you are scheduled by another doctor or dentist to have any type of surgery or procedure, you are allowed (for a period no longer  than 30 days), to receive additional pain medication, for the acute post-op pain. However, in this case, you are responsible for picking up a copy of  our "Post-op Pain Management for Surgeons" handout, and giving it to your surgeon or dentist. This document is available at our office, and does not require an appointment to obtain it. Simply go to our office during business hours (Monday-Thursday from 8:00 AM to 4:00 PM) (Friday 8:00 AM to 12:00 Noon) or if you have a scheduled appointment with [place], prior to your surgery, and ask for it by name. In addition, you are responsible for: calling our office [phone], at any time of the day or night, and leaving a message stating your name, name of your surgeon, type of surgery, and date of procedure or surgery. Failure to comply with your responsibilities may result in termination of therapy involving the controlled substances. Medication Agreement Violation. Following the above rules, including your responsibilities will help you in avoiding a Medication Agreement Violation ("Breaking your Pain Medication Contract").  Consequences:  Not following the above rules may result in permanent discontinuation of medication prescription therapy.  *Opioid medications include: morphine, codeine, oxycodone, oxymorphone, hydrocodone, hydromorphone, meperidine, tramadol, tapentadol, buprenorphine, fentanyl, methadone. **Benzodiazepine medications include: diazepam (Valium), alprazolam (Xanax), clonazepam (Klonopine), lorazepam (Ativan), clorazepate (Tranxene), chlordiazepoxide (Librium), estazolam (Prosom), oxazepam (Serax), temazepam (Restoril), triazolam (Halcion) (Last updated: 11/25/2021) ______________________________________________________________________    ______________________________________________________________________  Medication Recommendations and Reminders  Applies to: All patients receiving prescriptions (written and/or  electronic).  Medication Rules & Regulations: You are responsible for reading, knowing, and following our "Medication Rules" document. These exist for your safety and that of others. They are not flexible and neither are we. Dismissing or ignoring them is an act of "non-compliance" that may result in complete and irreversible termination of such medication therapy. For safety reasons, "non-compliance" will not be tolerated. As with the [place] fundamental legal principle of "ignorance of the law is no defense", we will accept no excuses for not having read and knowing the content of documents provided to you by our practice.  Pharmacy of record:  Definition: This is the pharmacy where your electronic prescriptions will be sent.  We do not endorse any particular pharmacy. It is up to you and your insurance to decide what pharmacy to use.  We do not restrict you in your choice of pharmacy. However, once we write for your prescriptions, we will NOT be re-sending more prescriptions to fix restricted supply problems created by your pharmacy, or your insurance.  The pharmacy listed in the electronic medical record should be the one where you want electronic prescriptions to be sent. If you choose to change pharmacy, simply notify our nursing staff. Changes will be made only during your regular appointments and not over the phone.  Recommendations: Keep all of your pain medications in a safe place, under lock and key, even if you live alone. We will NOT replace lost, stolen, or damaged medication. We do not accept "Police Reports" as proof of medications having been stolen. After you fill your prescription, take 1 week's worth of pills and put them away in a safe place. You should keep a separate, properly labeled bottle for this purpose. The remainder should be kept in the original bottle. Use this as your primary supply, until it runs out. Once it's gone, then you know that you have 1 week's worth of medicine,  and it is time to come in for a prescription refill. If you do this correctly, it is unlikely that you will ever run out of medicine. To make sure that the above recommendation works, it is very important that you make  sure your medication refill appointments are scheduled at least 1 week before you run out of medicine. To do this in an effective manner, make sure that you do not leave the office without scheduling your next medication management appointment. Always ask the nursing staff to show you in your prescription , when your medication will be running out. Then arrange for the receptionist to get you a return appointment, at least 7 days before you run out of medicine. Do not wait until you have 1 or 2 pills left, to come in. This is very poor planning and does not take into consideration that we may need to cancel appointments due to bad weather, sickness, or emergencies affecting our staff. DO NOT ACCEPT A "Partial Fill": If for any reason your pharmacy does not have enough pills/tablets to completely fill or refill your prescription, do not allow for a "partial fill". The law allows the pharmacy to complete that prescription within 72 hours, without requiring a new prescription. If they do not fill the rest of your prescription within those 72 hours, you will need a separate prescription to fill the remaining amount, which we will NOT provide. If the reason for the partial fill is your insurance, you will need to talk to the pharmacist about payment alternatives for the remaining tablets, but again, DO NOT ACCEPT A PARTIAL FILL, unless you can trust your pharmacist to obtain the remainder of the pills within 72 hours.  Prescription refills and/or changes in medication(s):  Prescription refills, and/or changes in dose or medication, will be conducted only during scheduled medication management appointments. (Applies to both, written and electronic prescriptions.) No refills on procedure days. No  medication will be changed or started on procedure days. No changes, adjustments, and/or refills will be conducted on a procedure day. Doing so will interfere with the diagnostic portion of the procedure. No phone refills. No medications will be "called into the pharmacy". No Fax refills. No weekend refills. No Holliday refills. No after hours refills.  Remember:  Business hours are:  Monday to Thursday 8:00 AM to 4:00 PM Provider's Schedule: Milinda Pointer, MD - Appointments are:  Medication management: Monday and Wednesday 8:00 AM to 4:00 PM Procedure day: Tuesday and Thursday 7:30 AM to 4:00 PM Gillis Santa, MD - Appointments are:  Medication management: Tuesday and Thursday 8:00 AM to 4:00 PM Procedure day: Monday and Wednesday 7:30 AM to 4:00 PM (Last update: 11/25/2021) ______________________________________________________________________    ____________________________________________________________________________________________  Drug Holidays  What is a "Drug Holiday"? Drug Holiday: is the name given to the process of slowly tapering down and temporarily stopping the pain medication for the purpose of decreasing or eliminating tolerance to the drug.  Benefits Improved effectiveness Decreased required effective dose Improved pain control End dependence on high dose therapy Decrease cost of therapy Uncovering "opioid-induced hyperalgesia". (OIH)  What is "opioid hyperalgesia"? It is a paradoxical increase in pain caused by exposure to opioids. Stopping the opioid pain medication, contrary to the expected, it actually decreases or completely eliminates the pain. Ref.: "A comprehensive review of opioid-induced hyperalgesia". Brion Aliment, et.al. Pain Physician. 2011 Mar-Apr;14(2):145-61.  What is tolerance? Tolerance: the progressive loss of effectiveness of a pain medicine due to repetitive use. A common problem of opioid pain medications.  How long should a "Drug  Holiday" last? Effectiveness depends on the patient staying off all opioid pain medicines for a minimum of 14 consecutive days. (2 weeks)  How about just taking less of the medicine? Does not  work. Will not accomplish goal of eliminating the excess receptors.  How about switching to a different pain medicine? (AKA. "Opioid rotation") Does not work. Creates the illusion of effectiveness by taking advantage of inaccurate equivalent dose calculations between different opioids. -This "technique" was promoted by studies funded by [employer], such as [employer], creators of "OxyContin".  Can I stop the medicine "cold [place]"? Depends. You should always coordinate with your Pain Specialist to make the transition as smoothly as possible. Avoid stopping the medicine abruptly without consulting. We recommend a "slow taper".  What is a slow taper? Taper: refers to the gradual decrease in dose.   How do I stop/taper the dose? Slowly. Decrease the daily amount of pills that you take by one (1) pill every seven (7) days. This is called a "slow downward taper". Example: if you normally take four (4) pills per day, drop it to three (3) pills per day for seven (7) days, then to two (2) pills per day for seven (7) days, then to one (1) per day for seven (7) days, and then stop the medicine. The 14 day "Drug Holiday" starts on the first day without medicine.   Will I experience withdrawals? Unlikely with a slow taper.  What triggers withdrawals? Withdrawals are triggered by the sudden/abrupt stop of high dose opioids. Withdrawals can be avoided by slowly decreasing the dose over a prolonged period of time.  What are withdrawals? Symptoms associated with sudden/abrupt reduction/stopping of high-dose, long-term use of pain medication. Withdrawal are seldom seen on low dose therapy, or patients rarely taking opioid medication.  Early Withdrawal Symptoms may include: Agitation Anxiety Muscle  aches Increased tearing Insomnia Runny nose Sweating Yawning  Late symptoms may include: Abdominal cramping Diarrhea Dilated pupils Goose bumps Nausea Vomiting  (Last update: 01/11/2022) ____________________________________________________________________________________________    ____________________________________________________________________________________________  WARNING: CBD (cannabidiol) & Delta (Delta-8 tetrahydrocannabinol) products.   Applicable to:  All individuals currently taking or considering taking CBD (cannabidiol) and, more important, all patients taking opioid analgesic controlled substances (pain medication). (Example: oxycodone; oxymorphone; hydrocodone; hydromorphone; morphine; methadone; tramadol; tapentadol; fentanyl; buprenorphine; butorphanol; dextromethorphan; meperidine; codeine; etc.)  Introduction:  Recently there has been a drive towards the use of "natural" products for the treatment of different conditions, including pain anxiety and sleep disorders. Marijuana and hemp are two varieties of the cannabis genus plants. Marijuana and its derivatives are illegal, while hemp and its derivatives are not. Cannabidiol (CBD) and tetrahydrocannabinol (THC), are two natural compounds found in plants of the Cannabis genus. They can both be extracted from hemp or marijuana. Both compounds interact with your body's endocannabinoid system in very different ways. CBD is associated with pain relief (analgesia) while THC is associated with the psychoactive effects ("the high") obtained from the use of marijuana products. There are two main types of THC: Delta-9, which comes from the marijuana plant and it is illegal, and Delta-8, which comes from the hemp plant, and it is legal. (Both, Delta-9-THC and Delta-8-THC are psychoactive and give you "the high".)   Legality:  Marijuana and its derivatives: illegal Hemp and its derivatives: Legal (State dependent) UPDATE:  (03/21/2021) The Drug Enforcement Agency ([place]) issued a letter stating that "delta" cannabinoids, including Delta-8-THCO and Delta-9-THCO, synthetically derived from hemp do not qualify as hemp and will be viewed as Schedule I drugs. (Schedule I drugs, substances, or chemicals are defined as drugs with no currently accepted medical use and a high potential for abuse. Some examples of Schedule I drugs are: heroin,  lysergic acid diethylamide (LSD), marijuana (cannabis), 3,4-methylenedioxymethamphetamine (ecstasy), methaqualone, and peyote.) (https://jennings.com/)  Legal status of CBD in [place]:  "Conditionally Legal"  Reference: "FDA Regulation of Cannabis and Cannabis-Derived Products, Including Cannabidiol (CBD)" - SeekArt[employer].com.pt  Warning:  CBD is not FDA approved and has not undergo the same manufacturing controls as prescription drugs.  This means that the purity and safety of available CBD may be questionable. Most of the time, despite manufacturer's claims, it is contaminated with THC (delta-9-tetrahydrocannabinol - the chemical in marijuana responsible for the "HIGH").  When this is the case, the [facility] contaminant will trigger a positive urine drug screen (UDS) test for Marijuana (carboxy-THC).   The FDA recently put out a warning about 5 things that everyone should be aware of regarding Delta-8 THC: Delta-8 THC products have not been evaluated or approved by the FDA for safe use and may be marketed in ways that put the public health at risk. The FDA has received adverse event reports involving delta-8 THC-containing products. Delta-8 THC has psychoactive and intoxicating effects. Delta-8 THC manufacturing often involve use of potentially harmful chemicals to create the concentrations of delta-8 THC claimed in the marketplace. The final delta-8 THC product may have potentially harmful  by-products (contaminants) due to the chemicals used in the process. Manufacturing of delta-8 THC products may occur in uncontrolled or unsanitary settings, which may lead to the presence of unsafe contaminants or other potentially harmful substances. Delta-8 THC products should be kept out of the reach of children and pets.  NOTE: Because a positive UDS for any illicit substance is a violation of our medication agreement, your opioid analgesics (pain medicine) may be permanently discontinued.  MORE ABOUT CBD  General Information: CBD was discovered in [birth year] and it is a derivative of the cannabis sativa genus plants (Marijuana and Hemp). It is one of the 113 identified substances found in Marijuana. It accounts for up to 40% of the plant's extract. As of 2018, preliminary clinical studies on CBD included research for the treatment of anxiety, movement disorders, and pain. CBD is available and consumed in multiple forms, including inhalation of smoke or vapor, as an aerosol spray, and by mouth. It may be supplied as an oil containing CBD, capsules, dried cannabis, or as a liquid solution. CBD is thought not to be as psychoactive as THC (delta-9-tetrahydrocannabinol - the chemical in marijuana responsible for the "HIGH"). Studies suggest that CBD may interact with different biological target receptors in the body, including cannabinoid and other neurotransmitter receptors. As of 2018 the mechanism of action for its biological effects has not been determined.  Side-effects  Adverse reactions: Dry mouth, diarrhea, decreased appetite, fatigue, drowsiness, malaise, weakness, sleep disturbances, and others.  Drug interactions:  CBD may interact with medications such as blood-thinners. CBD causes drowsiness on its own and it will increase drowsiness caused by other medications, including antihistamines (such as Benadryl), benzodiazepines (Xanax, Ativan, Valium), antipsychotics, antidepressants, opioids, alcohol  and supplements such as kava, melatonin and St. John's Wort.  Other drug interactions: Brivaracetam (Briviact); Caffeine; Carbamazepine (Tegretol); Citalopram (Celexa); Clobazam (Onfi); Eslicarbazepine (Aptiom); Everolimus (Zostress); Lithium; Methadone (Dolophine); Rufinamide (Banzel); Sedative medications (CNS depressants); Sirolimus (Rapamune); Stiripentol (Diacomit); Tacrolimus (Prograf); Tamoxifen ; Soltamox); Topiramate (Topamax); Valproate; Warfarin (Coumadin); Zonisamide. (Last update: 01/12/2022) ____________________________________________________________________________________________   ____________________________________________________________________________________________  Naloxone Nasal Spray  Why am I receiving this medication? [place] STOP ACT requires that all patients taking high dose opioids or at risk of opioids respiratory depression, be prescribed an opioid reversal agent, such as  Naloxone (AKA: Narcan).  What is this medication? NALOXONE (nal OX one) treats opioid overdose, which causes slow or shallow breathing, severe drowsiness, or trouble staying awake. Call emergency services after using this medication. You may need additional treatment. Naloxone works by reversing the effects of opioids. It belongs to a group of medications called opioid blockers.  COMMON BRAND NAME(S): Kloxxado, Narcan  What should I tell my care team before I take this medication? They need to know if you have any of these conditions: Heart disease Substance use disorder An unusual or allergic reaction to naloxone, other medications, foods, dyes, or preservatives Pregnant or trying to get pregnant Breast-feeding  When to use this medication? This medication is to be used for the treatment of respiratory depression (less than 8 breaths per minute) secondary to opioid overdose.   How to use this medication? This medication is for use in the nose. Lay the person on their back.  Support their neck with your hand and allow the head to tilt back before giving the medication. The nasal spray should be given into 1 nostril. After giving the medication, move the person onto their side. Do not remove or test the nasal spray until ready to use. Get emergency medical help right away after giving the first dose of this medication, even if the person wakes up. You should be familiar with how to recognize the signs and symptoms of a narcotic overdose. If more doses are needed, give the additional dose in the other nostril. Talk to your care team about the use of this medication in children. While this medication may be prescribed for children as young as newborns for selected conditions, precautions do apply.  Naloxone Overdosage: If you think you have taken too much of this medicine contact a poison control center or emergency room at once.  NOTE: This medicine is only for you. Do not share this medicine with others.  What if I miss a dose? This does not apply.  What may interact with this medication? This is only used during an emergency. No interactions are expected during emergency use. This list may not describe all possible interactions. Give your health care provider a list of all the medicines, herbs, non-prescription drugs, or dietary supplements you use. Also tell them if you smoke, drink alcohol, or use illegal drugs. Some items may interact with your medicine.  What should I watch for while using this medication? Keep this medication ready for use in the case of an opioid overdose. Make sure that you have the phone number of your care team and local hospital ready. You may need to have additional doses of this medication. Each nasal spray contains a single dose. Some emergencies may require additional doses. After use, bring the treated person to the nearest hospital or call 911. Make sure the treating care team knows that the person has received a dose of this medication.  You will receive additional instructions on what to do during and after use of this medication before an emergency occurs.  What side effects may I notice from receiving this medication? Side effects that you should report to your care team as soon as possible: Allergic reactions--skin rash, itching, hives, swelling of the face, lips, tongue, or throat Side effects that usually do not require medical attention (report these to your care team if they continue or are bothersome): Constipation Dryness or irritation inside the nose Headache Increase in blood pressure Muscle spasms Stuffy nose Toothache This list may not  describe all possible side effects. Call your doctor for medical advice about side effects. You may report side effects to FDA at 1-800-FDA-1088.  Where should I keep my medication? Because this is an emergency medication, you should keep it with you at all times.  Keep out of the reach of children and pets. Store between 20 and 25 degrees C (68 and 77 degrees F). Do not freeze. Throw away any unused medication after the expiration date. Keep in original box until ready to use.  NOTE: This sheet is a summary. It may not cover all possible information. If you have questions about this medicine, talk to your doctor, pharmacist, or health care provider.   2023 Elsevier/Gold Standard (2020-09-27 00:00:00)  ____________________________________________________________________________________________

## 2022-04-07 NOTE — Progress Notes (Unsigned)
PROVIDER NOTE: Information contained herein reflects review and annotations entered in association with encounter. Interpretation of such information and data should be left to medically-trained personnel. Information provided to patient can be located elsewhere in the medical record under "Patient Instructions". Document created using STT-dictation technology, any transcriptional errors that may result from process are unintentional.    Patient: Holly Spears  Service Category: E/M  Provider: Gaspar Cola, MD  DOB: 04-23-1970  DOS: 04/08/2022  Referring Provider: Mike Craze  MRN: [id number]  Specialty: Interventional Pain Management  PCP: Lonzo Cloud, PA-C  Type: Established Patient  Setting: Ambulatory outpatient    Location: Office  Delivery: Face-to-face     HPI  [name], a 52 y.o. year old female, is here today because of her No primary diagnosis found.. [name]'s primary complain today is No chief complaint on file. Last encounter: My last encounter with her was on 02/12/2022. Pertinent problems: Ms. 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; Lumbar spondylosis (L3-4, L4-5, and L5-S1); Lumbar bulging disc (L3-4, L4-5, and L5-S1); Chronic lumbar radicular pain (Bilateral) (R>L); Chronic knee pain (3ry area of Pain) (Bilateral) (R>L); Chronic upper back pain (Left); Thoracic disc herniation (Large, Left paracentral T10-11 disc  herniation); Thoracic spinal stenosis (Severe, Left, T10-11 Lateral Recess Stenosis); Thoracic foraminal stenosis (Severe Left T10-11); History of bilateral carpal tunnel release (Bilateral); Chronic hand pain (Bilateral) (R>L); Muscle weakness; Chronic lower extremity pain (2ry area of Pain) (Bilateral); Neck pain; Spasm of back muscles; Myalgia and myositis; Fibromyalgia; Sjogren's syndrome (HCC); DDD (degenerative disc disease), cervical; Right cervical radiculopathy (C8); Abnormal MRI, cervical spine (10/16/2021); Enthesopathy of shoulder (Right); Trigger point of shoulder region (Right); Pain in shoulder region (Right); and Arthropathy of shoulder (Right) on their pertinent problem list. Pain Assessment: Severity of   is reported as a  /10. Location:    / . Onset:  . Quality:  . Timing:  . Modifying factor(s):  Marland Kitchen Vitals:  vitals were not taken for this visit.  BMI: Estimated body mass index is 27.44 kg/m as calculated from the following:   Height as of 02/12/22: '5\' 6"'$  (1.676 m).   Weight as of 02/12/22: 170 lb (77.1 kg).  Reason for encounter: medication management. ***  RTCB: 07/17/2022   Pharmacotherapy Assessment  Analgesic: Oxycodone IR 10 mg, 1 tab PO q 6 hrs (40 mg/day of oxycodone) MME/day: 60 mg/day.   Monitoring: [place] PMP: PDMP reviewed during this encounter.       Pharmacotherapy: No side-effects or adverse reactions reported. Compliance: No problems identified. Effectiveness: Clinically acceptable.  No notes on file  No results found for: "CBDTHCR" No results found for: "D8THCCBX" No results found for: "D9THCCBX"  UDS:  Summary  Date Value Ref Range Status  07/14/2021 Note  Final    Comment:    ==================================================================== [facility] (MW) ==================================================================== Test                             Result       Flag  Units  Drug Present and Declared for Prescription  Verification   Oxycodone                      749          EXPECTED   ng/mg creat   Oxymorphone                    2496         EXPECTED   ng/mg creat   Noroxycodone                   6964         EXPECTED   ng/mg creat   Noroxymorphone                 1100         EXPECTED   ng/mg creat    Sources of oxycodone are scheduled prescription medications.    Oxymorphone, noroxycodone, and noroxymorphone are expected    metabolites of oxycodone. Oxymorphone is also available as a    scheduled prescription medication.  ==================================================================== Test                      Result    Flag   Units      Ref Range   Creatinine              47               mg/dL      >=20 ==================================================================== Declared Medications:  The flagging and interpretation on this report are based on the  following declared medications.  Unexpected results may arise from  inaccuracies in the declared medications.   **Note: The testing scope of this panel includes these medications:   Oxycodone   **Note: The testing scope of this panel does not include the  following reported medications:   Biotin  Bupropion (Wellbutrin)  Calcium  Citalopram (Celexa)  Cyclobenzaprine (Flexeril)  Epinephrine (EpiPen)  Famotidine (Pepcid)  Fluoride (Prevident)  Hydroxychloroquine (Plaquenil)  Ibuprofen (Advil)  Levonorgestrel (Mirena)  Levothyroxine (Synthroid)  Magnesium  Multivitamin  Naloxone (Narcan)  Potassium (Klor-Con)  Propranolol (Inderal)  Vitamin D  Vitamin D3  Zinc ==================================================================== For clinical consultation, please call [phone]. ====================================================================       ROS  Constitutional: Denies any fever or chills Gastrointestinal: No reported hemesis, hematochezia, vomiting, or acute GI distress Musculoskeletal: Denies any  acute onset joint swelling, redness, loss of ROM, or weakness Neurological: No reported episodes of acute onset apraxia, aphasia, dysarthria, agnosia, amnesia, paralysis, loss of coordination, or loss of consciousness  Medication Review  Biotin, Calcium Carbonate-Vitamin D, Cholecalciferol, EPINEPHrine, Magnesium, Multi-Vitamins, NIFEdipine, Oxycodone HCl, Sodium Fluoride, Zinc, buPROPion, citalopram, cyclobenzaprine, famotidine, ibuprofen, levothyroxine, naloxone, and potassium chloride SA  History Review  Allergy: Ms. Aberg is allergic to bee venom, penicillins, shellfish allergy, sulfa antibiotics, erythromycin, zaleplon, codeine, and trazodone. Drug: Ms. Osby  reports no history of drug use. Alcohol:  reports no history of alcohol use. Tobacco:  reports that she has quit smoking. She has never used smokeless tobacco. Social: Ms. Hinkle  reports that she has quit smoking. She has never used smokeless tobacco. She reports that she does not drink alcohol and does not use drugs. Medical:  has a past medical history of Acid reflux (12/15/2011), Anxiety, generalized (12/15/2011), Arthritis, Cervical pain (12/15/2011), Depression, GERD (gastroesophageal reflux disease), Hiatal hernia, LBP (low back pain) (12/15/2011), Migraine, Muscle ache (12/15/2011), Osteopenia  of left hip, Pain (12/27/2012), Rheumatoid arthritis ([place]) (12/15/2011), and Scratched cornea (10/25/2015). Surgical: Ms. Jelley  has a past surgical history that includes Cholecystectomy and Abdominal hysterectomy. Family: family history includes Depression in her mother; Diabetes in her father; Hyperlipidemia in her mother; Hypertension in her father and mother.  Laboratory Chemistry Profile   Renal Lab Results  Component Value Date   BUN 6 03/03/2018   CREATININE 0.63 03/03/2018   BCR 10 03/03/2018   GFRAA 123 03/03/2018   GFRNONAA 106 03/03/2018    Hepatic Lab Results  Component Value Date   AST 21 03/03/2018   ALT 26  03/27/2015   ALBUMIN 4.6 03/03/2018   ALKPHOS 71 03/03/2018    Electrolytes Lab Results  Component Value Date   NA 136 03/03/2018   K 3.9 03/03/2018   CL 97 03/03/2018   CALCIUM 8.9 03/03/2018   MG 2.1 03/03/2018    Bone Lab Results  Component Value Date   VD25OH 29.3 (L) 12/31/2014    Inflammation (CRP: Acute Phase) (ESR: Chronic Phase) Lab Results  Component Value Date   CRP <1 03/03/2018   ESRSEDRATE 2 03/03/2018         Note: Above Lab results reviewed.  Recent Imaging Review  DG PAIN CLINIC C-ARM 1-60 MIN NO REPORT Fluoro was used, but no Radiologist interpretation will be provided.  Please refer to "NOTES" tab for provider progress note. Note: Reviewed        Physical Exam  General appearance: Well nourished, well developed, and well hydrated. In no apparent acute distress Mental status: Alert, oriented x 3 (person, place, & time)       Respiratory: No evidence of acute respiratory distress Eyes: PERLA Vitals: There were no vitals taken for this visit. BMI: Estimated body mass index is 27.44 kg/m as calculated from the following:   Height as of 02/12/22: '5\' 6"'$  (1.676 m).   Weight as of 02/12/22: 170 lb (77.1 kg). Ideal: Patient weight not recorded  Assessment   Diagnosis Status  1. Chronic pain syndrome   2. Pharmacologic therapy   3. Chronic neck pain (4th area of Pain)   4. Chronic musculoskeletal pain   5. Chronic shoulder pain (5th area of Pain) (Bilateral) (R>L)   6. Chronic knee pain (3ry area of Pain) (Bilateral) (R>L)   7. Encounter for medication management   8. Failed back surgical syndrome   9. Encounter for chronic pain management   10. Chronic use of opiate for therapeutic purpose   11. Arthropathy of shoulder (Right)   12. Chronic lower extremity pain (2ry area of Pain) (Bilateral)   13. Chronic low back pain (1ry area of Pain) (Bilateral) (R>L) w/o sciatica    Controlled Controlled Controlled   Updated Problems: No problems  updated.  Plan of Care  Problem-specific:  No problem-specific Assessment & Plan notes found for this encounter.  Ms. Ady Mcfall has a current medication list which includes the following long-term medication(s): bupropion, calcium carbonate-vitamin d, citalopram, cyclobenzaprine, famotidine, levothyroxine, naloxone, nifedipine, and oxycodone hcl.  Pharmacotherapy (Medications Ordered): No orders of the defined types were placed in this encounter.  Orders:  No orders of the defined types were placed in this encounter.  Follow-up plan:   No follow-ups on file.      Interventional Therapies  Risk Factors  Complex Considerations:   The patient had a bad experience with another physician receiving blocks.   Planned  Pending:      Under consideration:   Diagnostic  bilateral lumbar facet block  Possible bilateral lumbar facet RFA  Diagnostic right L5-S1 LESI  Possible right L5 vs S1 TFESI  Diagnostic bilateral IA knee injections (w/ steroid)  Possible series of 5 IA Hyalgan knee injections  Diagnostic bilateral Genicular NB  Possible bilateral Genicular nerve RFA    Completed:   Diagnostic right suprascapular NB x1 (11/25/2021) (100/80/80/80)  Therapeutic right suprascapular nerve RFA #1 (01/01/2022) (100/100/75/pain flareup/75)    Therapeutic  Palliative (PRN) options:   None.   Pharmacotherapy  Nonopioids transferred 11/22/2019: Flexeril       Recent Visits Date Type Provider Dept  02/12/22 Office Visit Milinda Pointer, MD [facility]  Showing recent visits within past 90 days and meeting all other requirements Future Appointments Date Type Provider Dept  04/08/22 Appointment Milinda Pointer, MD [facility]  Showing future appointments within next 90 days and meeting all other requirements  I discussed the assessment and treatment plan with the patient. The patient was provided an opportunity to ask questions and all were answered.  The patient agreed with the plan and demonstrated an understanding of the instructions.  Patient advised to call back or seek an in-person evaluation if the symptoms or condition worsens.  Duration of encounter: *** minutes.  Total time on encounter, as per AMA guidelines included both the face-to-face and non-face-to-face time personally spent by the physician and/or other qualified health care professional(s) on the day of the encounter (includes time in activities that require the physician or other qualified health care professional and does not include time in activities normally performed by clinical staff). Physician's time may include the following activities when performed: Preparing to see the patient (e.g., pre-charting review of records, searching for previously ordered imaging, lab work, and nerve conduction tests) Review of prior analgesic pharmacotherapies. Reviewing PMP Interpreting ordered tests (e.g., lab work, imaging, nerve conduction tests) Performing post-procedure evaluations, including interpretation of diagnostic procedures Obtaining and/or reviewing separately obtained history Performing a medically appropriate examination and/or evaluation Counseling and educating the patient/family/caregiver Ordering medications, tests, or procedures Referring and communicating with other health care professionals (when not separately reported) Documenting clinical information in the electronic or other health record Independently interpreting results (not separately reported) and communicating results to the patient/ family/caregiver Care coordination (not separately reported)  Note by: Gaspar Cola, MD Date: 04/08/2022; Time: 1:05 PM

## 2022-04-08 ENCOUNTER — Encounter: Payer: Self-pay | Admitting: Pain Medicine

## 2022-04-08 ENCOUNTER — Ambulatory Visit: Payer: 59 | Attending: Pain Medicine | Admitting: Pain Medicine

## 2022-04-08 DIAGNOSIS — M25511 Pain in right shoulder: Secondary | ICD-10-CM | POA: Diagnosis present

## 2022-04-08 DIAGNOSIS — M19011 Primary osteoarthritis, right shoulder: Secondary | ICD-10-CM | POA: Insufficient documentation

## 2022-04-08 DIAGNOSIS — M79605 Pain in left leg: Secondary | ICD-10-CM | POA: Insufficient documentation

## 2022-04-08 DIAGNOSIS — G894 Chronic pain syndrome: Secondary | ICD-10-CM | POA: Insufficient documentation

## 2022-04-08 DIAGNOSIS — M25561 Pain in right knee: Secondary | ICD-10-CM | POA: Diagnosis present

## 2022-04-08 DIAGNOSIS — M25512 Pain in left shoulder: Secondary | ICD-10-CM | POA: Insufficient documentation

## 2022-04-08 DIAGNOSIS — M542 Cervicalgia: Secondary | ICD-10-CM | POA: Diagnosis present

## 2022-04-08 DIAGNOSIS — M545 Low back pain, unspecified: Secondary | ICD-10-CM | POA: Diagnosis present

## 2022-04-08 DIAGNOSIS — M79604 Pain in right leg: Secondary | ICD-10-CM | POA: Diagnosis present

## 2022-04-08 DIAGNOSIS — G8929 Other chronic pain: Secondary | ICD-10-CM | POA: Insufficient documentation

## 2022-04-08 DIAGNOSIS — Z79891 Long term (current) use of opiate analgesic: Secondary | ICD-10-CM | POA: Insufficient documentation

## 2022-04-08 DIAGNOSIS — M961 Postlaminectomy syndrome, not elsewhere classified: Secondary | ICD-10-CM | POA: Diagnosis present

## 2022-04-08 DIAGNOSIS — M7918 Myalgia, other site: Secondary | ICD-10-CM | POA: Insufficient documentation

## 2022-04-08 DIAGNOSIS — Z79899 Other long term (current) drug therapy: Secondary | ICD-10-CM | POA: Insufficient documentation

## 2022-04-08 DIAGNOSIS — M25562 Pain in left knee: Secondary | ICD-10-CM | POA: Insufficient documentation

## 2022-04-08 MED ORDER — OXYCODONE HCL 10 MG PO TABS: 10.0000 mg | ORAL_TABLET | Freq: Four times a day (QID) | ORAL | 0 refills | Status: DC | PRN

## 2022-04-08 NOTE — Progress Notes (Signed)
Nursing Pain Medication Assessment:  Safety precautions to be maintained throughout the outpatient stay will include: orient to surroundings, keep bed in low position, maintain call bell within reach at all times, provide assistance with transfer out of bed and ambulation.  Medication Inspection Compliance: Pill count conducted under aseptic conditions, in front of the patient. Neither the pills nor the bottle was removed from the patient's sight at any time. Once count was completed pills were immediately returned to the patient in their original bottle.  Medication: Oxycodone IR Pill/Patch Count:  42 of 120 pills remain Pill/Patch Appearance: Markings consistent with prescribed medication Bottle Appearance: Standard pharmacy container. Clearly labeled. Filled Date: 2 / 15 / 2024 Last Medication intake:  TodaySafety precautions to be maintained throughout the outpatient stay will include: orient to surroundings, keep bed in low position, maintain call bell within reach at all times, provide assistance with transfer out of bed and ambulation.

## 2022-07-12 NOTE — Progress Notes (Unsigned)
PROVIDER NOTE: Information contained herein reflects review and annotations entered in association with encounter. Interpretation of such information and data should be left to medically-trained personnel. Information provided to patient can be located elsewhere in the medical record under "Patient Instructions". Document created using STT-dictation technology, any transcriptional errors that may result from process are unintentional.    Patient: Holly Spears  Service Category: E/M  Provider: Oswaldo Done, MD  DOB: 1970-03-18  DOS: 07/13/2022  Referring Provider: Rhea Belton  MRN: [id number]  Specialty: Interventional Pain Management  PCP: Perrin Maltese, PA-C  Type: Established Patient  Setting: Ambulatory outpatient    Location: Office  Delivery: Face-to-face     HPI  Holly Spears, a 52 y.o. year old female, is here today because of her No primary diagnosis found.. Holly Spears's primary complain today is No chief complaint on file.  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; Lumbar spondylosis (L3-4, L4-5, and L5-S1); Lumbar bulging disc (L3-4, L4-5, and L5-S1); Chronic lumbar radicular pain (Bilateral) (R>L); Chronic knee pain (3ry area of Pain) (Bilateral) (R>L); Chronic upper back pain (Left); Thoracic disc herniation (Large, Left paracentral T10-11 disc herniation); Thoracic spinal stenosis (Severe, Left, T10-11 Lateral  Recess Stenosis); Thoracic foraminal stenosis (Severe Left T10-11); History of bilateral carpal tunnel release (Bilateral); Chronic hand pain (Bilateral) (R>L); Muscle weakness; Chronic lower extremity pain (2ry area of Pain) (Bilateral); Neck pain; Spasm of back muscles; Myalgia and myositis; Fibromyalgia; Sjogren's syndrome (HCC); DDD (degenerative disc disease), cervical; Right cervical radiculopathy (C8); Abnormal MRI, cervical spine (10/16/2021); Enthesopathy of shoulder (Right); Trigger point of shoulder region (Right); Pain in shoulder region (Right); and Arthropathy of shoulder (Right) on their pertinent problem list. Pain Assessment: Severity of   is reported as a  /10. Location:    / . Onset:  . Quality:  . Timing:  . Modifying factor(s):  Marland Kitchen Vitals:  vitals were not taken for this visit.  BMI: Estimated body mass index is 27.44 kg/m as calculated from the following:   Height as of 04/08/22: 5\' 6"  (1.676 m).   Weight as of 04/08/22: 170 lb (77.1 kg). Last encounter: 04/08/2022. Last procedure: 01/01/2022.  Reason for encounter: medication management. ***  Pharmacotherapy Assessment  Analgesic: Oxycodone IR 10 mg, 1 tab PO q 6 hrs (40 mg/day of oxycodone) MME/day: 60 mg/day.   Monitoring: [place] PMP: PDMP reviewed during this encounter.       Pharmacotherapy: No side-effects or adverse reactions reported. Compliance: No problems identified. Effectiveness: Clinically acceptable.  No notes on file  No results found for: "CBDTHCR" No results found for: "D8THCCBX" No results found for: "D9THCCBX"  UDS:  Summary  Date Value Ref Range Status  07/14/2021 Note  Final    Comment:    ==================================================================== [facility] (MW) ==================================================================== Test                             Result       Flag       Units  Drug Present and Declared for Prescription Verification   Oxycodone                       749          EXPECTED   ng/mg creat   Oxymorphone                    2496         EXPECTED   ng/mg creat   Noroxycodone                   6964         EXPECTED   ng/mg creat   Noroxymorphone                 1100         EXPECTED   ng/mg creat    Sources of oxycodone are scheduled prescription medications.    Oxymorphone, noroxycodone, and noroxymorphone are expected    metabolites of oxycodone. Oxymorphone is also available as a    scheduled prescription medication.  ==================================================================== Test                      Result    Flag   Units      Ref Range   Creatinine              47               mg/dL      >=16 ==================================================================== Declared Medications:  The flagging and interpretation on this report are based on the  following declared medications.  Unexpected results may arise from  inaccuracies in the declared medications.   **Note: The testing scope of this panel includes these medications:   Oxycodone   **Note: The testing scope of this panel does not include the  following reported medications:   Biotin  Bupropion (Wellbutrin)  Calcium  Citalopram (Celexa)  Cyclobenzaprine (Flexeril)  Epinephrine (EpiPen)  Famotidine (Pepcid)  Fluoride (Prevident)  Hydroxychloroquine (Plaquenil)  Ibuprofen (Advil)  Levonorgestrel (Mirena)  Levothyroxine (Synthroid)  Magnesium  Multivitamin  Naloxone (Narcan)  Potassium (Klor-Con)  Propranolol (Inderal)  Vitamin D  Vitamin D3  Zinc ==================================================================== For clinical consultation, please call [phone]. ====================================================================       ROS  Constitutional: Denies any fever or chills Gastrointestinal: No reported hemesis, hematochezia, vomiting, or acute GI distress Musculoskeletal: Denies any acute onset joint swelling, redness, loss of  ROM, or weakness Neurological: No reported episodes of acute onset apraxia, aphasia, dysarthria, agnosia, amnesia, paralysis, loss of coordination, or loss of consciousness  Medication Review  Biotin, Calcium Carbonate-Vitamin D, EPINEPHrine, Magnesium, Multi-Vitamins, NIFEdipine, Oxycodone HCl, Sodium Fluoride, Vitamin D3, Zinc, buPROPion, citalopram, cyclobenzaprine, famotidine, ibuprofen, levothyroxine, naloxone, and potassium chloride SA  History Review  Allergy: Holly Spears is allergic to bee venom, penicillins, shellfish allergy, sulfa antibiotics, erythromycin, zaleplon, codeine, and trazodone. Drug: Holly Spears  reports no history of drug use. Alcohol:  reports no history of alcohol use. Tobacco:  reports that she has quit smoking. She has never used smokeless tobacco. Social: Holly Spears  reports that she has quit smoking. She has never used smokeless tobacco. She reports that she does not drink alcohol and does not use drugs. Medical:  has a past medical history of Acid reflux (12/15/2011), Anxiety, generalized (12/15/2011), Arthritis, Cervical pain (12/15/2011), Depression, GERD (gastroesophageal reflux disease), Hiatal hernia, LBP (low back pain) (12/15/2011), Migraine, Muscle ache (12/15/2011), Osteopenia of  left hip, Pain (12/27/2012), Rheumatoid arthritis (HCC) (12/15/2011), and Scratched cornea (10/25/2015). Surgical: Holly Spears  has a past surgical history that includes Cholecystectomy and Abdominal hysterectomy. Family: family history includes Depression in her mother; Diabetes in her father; Hyperlipidemia in her mother; Hypertension in her father and mother.  Laboratory Chemistry Profile   Renal Lab Results  Component Value Date   BUN 6 03/03/2018   CREATININE 0.63 03/03/2018   BCR 10 03/03/2018   GFRAA 123 03/03/2018   GFRNONAA 106 03/03/2018    Hepatic Lab Results  Component Value Date   AST 21 03/03/2018   ALT 26 03/27/2015   ALBUMIN 4.6 03/03/2018   ALKPHOS 71  03/03/2018    Electrolytes Lab Results  Component Value Date   NA 136 03/03/2018   K 3.9 03/03/2018   CL 97 03/03/2018   CALCIUM 8.9 03/03/2018   MG 2.1 03/03/2018    Bone Lab Results  Component Value Date   VD25OH 29.3 (L) 12/31/2014    Inflammation (CRP: Acute Phase) (ESR: Chronic Phase) Lab Results  Component Value Date   CRP <1 03/03/2018   ESRSEDRATE 2 03/03/2018         Note: Above Lab results reviewed.  Recent Imaging Review  DG PAIN CLINIC C-ARM 1-60 MIN NO REPORT Fluoro was used, but no Radiologist interpretation will be provided.  Please refer to "NOTES" tab for provider progress note. Note: Reviewed        Physical Exam  General appearance: Well nourished, well developed, and well hydrated. In no apparent acute distress Mental status: Alert, oriented x 3 (person, place, & time)       Respiratory: No evidence of acute respiratory distress Eyes: PERLA Vitals: There were no vitals taken for this visit. BMI: Estimated body mass index is 27.44 kg/m as calculated from the following:   Height as of 04/08/22: 5\' 6"  (1.676 m).   Weight as of 04/08/22: 170 lb (77.1 kg). Ideal: Patient weight not recorded  Assessment   Diagnosis Status  No diagnosis found. Controlled Controlled Controlled   Updated Problems: No problems updated.  Plan of Care  Problem-specific:  No problem-specific Assessment & Plan notes found for this encounter.  Holly Spears has a current medication list which includes the following long-term medication(s): bupropion, calcium carbonate-vitamin d, citalopram, cyclobenzaprine, famotidine, levothyroxine, naloxone, nifedipine, oxycodone hcl, oxycodone hcl, and oxycodone hcl.  Pharmacotherapy (Medications Ordered): No orders of the defined types were placed in this encounter.  Orders:  No orders of the defined types were placed in this encounter.  Follow-up plan:   No follow-ups on file.      Interventional Therapies  Risk Factors   Complex Considerations:   The patient had a bad experience with another physician receiving blocks.   Planned  Pending:      Under consideration:   Diagnostic bilateral lumbar facet block  Possible bilateral lumbar facet RFA  Diagnostic right L5-S1 LESI  Possible right L5 vs S1 TFESI  Diagnostic bilateral IA knee injections (w/ steroid)  Possible series of 5 IA Hyalgan knee injections  Diagnostic bilateral Genicular NB  Possible bilateral Genicular nerve RFA    Completed:   Diagnostic right suprascapular NB x1 (11/25/2021) (100/80/80/80)  Therapeutic right suprascapular nerve RFA #1 (01/01/2022) (100/100/75/pain flareup/75)    Therapeutic  Palliative (PRN) options:   None.   Pharmacotherapy  Nonopioids transferred 11/22/2019: Flexeril        Recent Visits No visits were found meeting these conditions. Showing recent visits within  past 90 days and meeting all other requirements Future Appointments Date Type Provider Dept  07/13/22 Appointment Delano Metz, MD [facility]  Showing future appointments within next 90 days and meeting all other requirements  I discussed the assessment and treatment plan with the patient. The patient was provided an opportunity to ask questions and all were answered. The patient agreed with the plan and demonstrated an understanding of the instructions.  Patient advised to call back or seek an in-person evaluation if the symptoms or condition worsens.  Duration of encounter: *** minutes.  Total time on encounter, as per AMA guidelines included both the face-to-face and non-face-to-face time personally spent by the physician and/or other qualified health care professional(s) on the day of the encounter (includes time in activities that require the physician or other qualified health care professional and does not include time in activities normally performed by clinical staff). Physician's time may include the following  activities when performed: Preparing to see the patient (e.g., pre-charting review of records, searching for previously ordered imaging, lab work, and nerve conduction tests) Review of prior analgesic pharmacotherapies. Reviewing PMP Interpreting ordered tests (e.g., lab work, imaging, nerve conduction tests) Performing post-procedure evaluations, including interpretation of diagnostic procedures Obtaining and/or reviewing separately obtained history Performing a medically appropriate examination and/or evaluation Counseling and educating the patient/family/caregiver Ordering medications, tests, or procedures Referring and communicating with other health care professionals (when not separately reported) Documenting clinical information in the electronic or other health record Independently interpreting results (not separately reported) and communicating results to the patient/ family/caregiver Care coordination (not separately reported)  Note by: Oswaldo Done, MD Date: 07/13/2022; Time: 6:50 PM

## 2022-07-13 ENCOUNTER — Encounter: Payer: Self-pay | Admitting: Pain Medicine

## 2022-07-13 ENCOUNTER — Ambulatory Visit: Payer: 59 | Attending: Pain Medicine | Admitting: Pain Medicine

## 2022-07-13 VITALS — BP 126/82 | HR 103 | Temp 98.2°F | Resp 16 | Ht 66.0 in | Wt 170.0 lb

## 2022-07-13 DIAGNOSIS — M79605 Pain in left leg: Secondary | ICD-10-CM | POA: Diagnosis present

## 2022-07-13 DIAGNOSIS — M7918 Myalgia, other site: Secondary | ICD-10-CM

## 2022-07-13 DIAGNOSIS — M545 Low back pain, unspecified: Secondary | ICD-10-CM | POA: Diagnosis present

## 2022-07-13 DIAGNOSIS — M25562 Pain in left knee: Secondary | ICD-10-CM | POA: Diagnosis present

## 2022-07-13 DIAGNOSIS — Z79899 Other long term (current) drug therapy: Secondary | ICD-10-CM

## 2022-07-13 DIAGNOSIS — G894 Chronic pain syndrome: Secondary | ICD-10-CM | POA: Diagnosis present

## 2022-07-13 DIAGNOSIS — G8929 Other chronic pain: Secondary | ICD-10-CM

## 2022-07-13 DIAGNOSIS — M79604 Pain in right leg: Secondary | ICD-10-CM | POA: Insufficient documentation

## 2022-07-13 DIAGNOSIS — M961 Postlaminectomy syndrome, not elsewhere classified: Secondary | ICD-10-CM | POA: Diagnosis present

## 2022-07-13 DIAGNOSIS — M25512 Pain in left shoulder: Secondary | ICD-10-CM | POA: Insufficient documentation

## 2022-07-13 DIAGNOSIS — M25511 Pain in right shoulder: Secondary | ICD-10-CM | POA: Diagnosis present

## 2022-07-13 DIAGNOSIS — M19011 Primary osteoarthritis, right shoulder: Secondary | ICD-10-CM | POA: Diagnosis present

## 2022-07-13 DIAGNOSIS — M25561 Pain in right knee: Secondary | ICD-10-CM | POA: Insufficient documentation

## 2022-07-13 DIAGNOSIS — Z79891 Long term (current) use of opiate analgesic: Secondary | ICD-10-CM

## 2022-07-13 DIAGNOSIS — M542 Cervicalgia: Secondary | ICD-10-CM | POA: Insufficient documentation

## 2022-07-13 MED ORDER — OXYCODONE HCL 10 MG PO TABS
10.0000 mg | ORAL_TABLET | Freq: Four times a day (QID) | ORAL | 0 refills | Status: DC | PRN
Start: 2022-08-16 — End: 2022-10-04

## 2022-07-13 MED ORDER — OXYCODONE HCL 10 MG PO TABS
10.0000 mg | ORAL_TABLET | Freq: Four times a day (QID) | ORAL | 0 refills | Status: DC | PRN
Start: 2022-09-15 — End: 2022-10-04

## 2022-07-13 MED ORDER — OXYCODONE HCL 10 MG PO TABS
10.0000 mg | ORAL_TABLET | Freq: Four times a day (QID) | ORAL | 0 refills | Status: DC | PRN
Start: 2022-07-17 — End: 2022-10-04

## 2022-07-13 NOTE — Patient Instructions (Signed)
____________________________________________________________________________________________  Opioid Pain Medication Update  To: All patients taking opioid pain medications. (I.e.: hydrocodone, hydromorphone, oxycodone, oxymorphone, morphine, codeine, methadone, tapentadol, tramadol, buprenorphine, fentanyl, etc.)  Re: Updated review of side effects and adverse reactions of opioid analgesics, as well as new information about long term effects of this class of medications.  Direct risks of long-term opioid therapy are not limited to opioid addiction and overdose. Potential medical risks include serious fractures, breathing problems during sleep, hyperalgesia, immunosuppression, chronic constipation, bowel obstruction, myocardial infarction, and tooth decay secondary to xerostomia.  Unpredictable adverse effects that can occur even if you take your medication correctly: Cognitive impairment, respiratory depression, and death. Most people think that if they take their medication "correctly", and "as instructed", that they will be safe. Nothing could be farther from the truth. In reality, a significant amount of recorded deaths associated with the use of opioids has occurred in individuals that had taken the medication for a long time, and were taking their medication correctly. The following are examples of how this can happen: Patient taking his/her medication for a long time, as instructed, without any side effects, is given a certain antibiotic or another unrelated medication, which in turn triggers a "Drug-to-drug interaction" leading to disorientation, cognitive impairment, impaired reflexes, respiratory depression or an untoward event leading to serious bodily harm or injury, including death.  Patient taking his/her medication for a long time, as instructed, without any side effects, develops an acute impairment of liver and/or kidney function. This will lead to a rapid inability of the body to  breakdown and eliminate their pain medication, which will result in effects similar to an "overdose", but with the same medicine and dose that they had always taken. This again may lead to disorientation, cognitive impairment, impaired reflexes, respiratory depression or an untoward event leading to serious bodily harm or injury, including death.  A similar problem will occur with patients as they grow older and their liver and kidney function begins to decrease as part of the aging process.  Background information: Historically, the original case for using long-term opioid therapy to treat chronic noncancer pain was based on safety assumptions that subsequent experience has called into question. In 1996, the American Pain Society and the American Academy of Pain Medicine issued a consensus statement supporting long-term opioid therapy. This statement acknowledged the dangers of opioid prescribing but concluded that the risk for addiction was low; respiratory depression induced by opioids was short-lived, occurred mainly in opioid-naive patients, and was antagonized by pain; tolerance was not a common problem; and efforts to control diversion should not constrain opioid prescribing. This has now proven to be wrong. Experience regarding the risks for opioid addiction, misuse, and overdose in community practice has failed to support these assumptions.  According to the Centers for Disease Control and Prevention, fatal overdoses involving opioid analgesics have increased sharply over the past decade. Currently, more than 96,700 people die from drug overdoses every year. Opioids are a factor in 7 out of every 10 overdose deaths. Deaths from drug overdose have surpassed motor vehicle accidents as the leading cause of death for individuals between the ages of 35 and 54.  Clinical data suggest that neuroendocrine dysfunction may be very common in both men and women, potentially causing hypogonadism, erectile  dysfunction, infertility, decreased libido, osteoporosis, and depression. Recent studies linked higher opioid dose to increased opioid-related mortality. Controlled observational studies reported that long-term opioid therapy may be associated with increased risk for cardiovascular events. Subsequent meta-analysis concluded   that the safety of long-term opioid therapy in elderly patients has not been proven.   Side Effects and adverse reactions: Common side effects: Drowsiness (sedation). Dizziness. Nausea and vomiting. Constipation. Physical dependence -- Dependence often manifests with withdrawal symptoms when opioids are discontinued or decreased. Tolerance -- As you take repeated doses of opioids, you require increased medication to experience the same effect of pain relief. Respiratory depression -- This can occur in healthy people, especially with higher doses. However, people with COPD, asthma or other lung conditions may be even more susceptible to fatal respiratory impairment.  Uncommon side effects: An increased sensitivity to feeling pain and extreme response to pain (hyperalgesia). Chronic use of opioids can lead to this. Delayed gastric emptying (the process by which the contents of your stomach are moved into your small intestine). Muscle rigidity. Immune system and hormonal dysfunction. Quick, involuntary muscle jerks (myoclonus). Arrhythmia. Itchy skin (pruritus). Dry mouth (xerostomia).  Long-term side effects: Chronic constipation. Sleep-disordered breathing (SDB). Increased risk of bone fractures. Hypothalamic-pituitary-adrenal dysregulation. Increased risk of overdose.  RISKS: Fractures and Falls:  Opioids increase the risk and incidence of falls. This is of particular importance in elderly patients.  Endocrine System:  Long-term administration is associated with endocrine abnormalities (endocrinopathies). (Also known as Opioid-induced Endocrinopathy) Influences  on both the hypothalamic-pituitary-adrenal axis?and the hypothalamic-pituitary-gonadal axis have been demonstrated with consequent hypogonadism and adrenal insufficiency in both sexes. Hypogonadism and decreased levels of dehydroepiandrosterone sulfate have been reported in men and women. Endocrine effects include: Amenorrhoea in women (abnormal absence of menstruation) Reduced libido in both sexes Decreased sexual function Erectile dysfunction in men Hypogonadisms (decreased testicular function with shrinkage of testicles) Infertility Depression and fatigue Loss of muscle mass Anxiety Depression Immune suppression Hyperalgesia Weight gain Anemia Osteoporosis Patients (particularly women of childbearing age) should avoid opioids. There is insufficient evidence to recommend routine monitoring of asymptomatic patients taking opioids in the long-term for hormonal deficiencies.  Immune System: Human studies have demonstrated that opioids have an immunomodulating effect. These effects are mediated via opioid receptors both on immune effector cells and in the central nervous system. Opioids have been demonstrated to have adverse effects on antimicrobial response and anti-tumour surveillance. Buprenorphine has been demonstrated to have no impact on immune function.  Opioid Induced Hyperalgesia: Human studies have demonstrated that prolonged use of opioids can lead to a state of abnormal pain sensitivity, sometimes called opioid induced hyperalgesia (OIH). Opioid induced hyperalgesia is not usually seen in the absence of tolerance to opioid analgesia. Clinically, hyperalgesia may be diagnosed if the patient on long-term opioid therapy presents with increased pain. This might be qualitatively and anatomically distinct from pain related to disease progression or to breakthrough pain resulting from development of opioid tolerance. Pain associated with hyperalgesia tends to be more diffuse than the  pre-existing pain and less defined in quality. Management of opioid induced hyperalgesia requires opioid dose reduction.  Cancer: Chronic opioid therapy has been associated with an increased risk of cancer among noncancer patients with chronic pain. This association was more evident in chronic strong opioid users. Chronic opioid consumption causes significant pathological changes in the small intestine and colon. Epidemiological studies have found that there is a link between opium dependence and initiation of gastrointestinal cancers. Cancer is the second leading cause of death after cardiovascular disease. Chronic use of opioids can cause multiple conditions such as GERD, immunosuppression and renal damage as well as carcinogenic effects, which are associated with the incidence of cancers.   Mortality: Long-term opioid use   has been associated with increased mortality among patients with chronic non-cancer pain (CNCP).  Prescription of long-acting opioids for chronic noncancer pain was associated with a significantly increased risk of all-cause mortality, including deaths from causes other than overdose.  Reference: Von Korff M, Kolodny A, Deyo RA, Chou R. Long-term opioid therapy reconsidered. Ann Intern Med. 2011 Sep 6;155(5):325-8. doi: 10.7326/[id number]. PMID: 16109604; PMCID: [id number]. Randon Goldsmith, Hayward RA, Dunn KM, [place] KP. Risk of adverse events in patients prescribed long-term opioids: A cohort study in the [place] Clinical Practice Research Datalink. Eur J Pain. 2019 May;23(5):908-922. doi: 10.1002/ejp.1357. Epub 2019 Jan 31. PMID: 78295621. Colameco S, Coren JS, Ciervo CA. Continuous opioid treatment for chronic noncancer pain: a time for moderation in prescribing. Postgrad Med. 2009 Jul;121(4):61-6. doi: 10.3810/pgm.2009.07.2032. PMID: 30865784. William Hamburger RN, Durand SD, Blazina I, Cristopher Peru, Bougatsos C, Deyo RA. The  effectiveness and risks of long-term opioid therapy for chronic pain: a systematic review for a [employer] of [employer] Pathways to [employer]. Ann Intern Med. 2015 Feb 17;162(4):276-86. doi: 10.7326/M14-2559. PMID: 69629528. Caryl Bis [facility], [place] DM. NCHS Data Brief No. 22. [place]: Centers for Disease Control and Prevention; 2009. Sep, Increase in Fatal Poisonings Involving Opioid Analgesics in the [place], 1999-2006. Song IA, Choi HR, Oh TK. Long-term opioid use and mortality in patients with chronic non-cancer pain: Ten-year follow-up study in [place] from 2010 through 2019. EClinicalMedicine. 2022 Jul 18;51:101558. doi: 10.1016/j.eclinm.2022.413244. PMID: 01027253; PMCID: [id number]. Huser, W., Schubert, T., Vogelmann, T. et al. All-cause mortality in patients with long-term opioid therapy compared with non-opioid analgesics for chronic non-cancer pain: a database study. BMC Med 18, 162 (2020). http://lester.info/ Rashidian H, Karie Kirks, Malekzadeh R, Haghdoost AA. An Ecological Study of the Association between Opiate Use and Incidence of Cancers. Addict Health. 2016 Fall;8(4):252-260. PMID: 59563875; PMCID: [id number].  Our Goal: Our goal is to control your pain with means other than the use of opioid pain medications.  Our Recommendation: Talk to your physician about coming off of these medications. We can assist you with the tapering down and stopping these medicines. Based on the new information, even if you cannot completely stop the medication, a decrease in the dose may be associated with a lesser risk. Ask for other means of controlling the pain. Decrease or eliminate those factors that significantly contribute to your pain such as smoking, obesity, and a diet heavily tilted towards "inflammatory" nutrients.  Last Updated: 04/01/2022    ____________________________________________________________________________________________     ____________________________________________________________________________________________  Transfer of Pain Medication between Pharmacies  Re: 2023 DEA Clarification on existing regulation  Published on DEA Website: October 03, 2021  Title: Revised Regulation Allows DEA-Registered Pharmacies to Electrical engineer Prescriptions at a Patient's Request DEA Headquarters Division - [employer]  "Patients now have the ability to request their electronic prescription be transferred to another pharmacy without having to go back to their practitioner to initiate the request. This revised regulation went into effect on Monday, September 29, 2021.     At a patient's request, a DEA-registered retail pharmacy can now transfer an electronic prescription for a controlled substance (schedules II-V) to another DEA-registered retail pharmacy. Prior to this change, patients would have to go through their practitioner to cancel their prescription and have it re-issued to a different pharmacy. The process was taxing and time consuming for both patients and practitioners.    The Drug Enforcement Administration [facility]) published its intent to revise the  process for transferring electronic prescriptions on December 22, 2019.  The final rule was published in the federal register on August 28, 2021 and went into effect 30 days later.  Under the final rule, a prescription can only be transferred once between pharmacies, and only if allowed under existing state or other applicable law. The prescription must remain in its electronic form; may not be altered in any way; and the transfer must be communicated directly between two licensed pharmacists. It's important to note, any authorized refills transfer with the original prescription, which means the entire prescription will be filled at the same pharmacy."     REFERENCES: 1. DEA website announcement HugeHand.is  2. Department of Justice website  CheapWipes.at.pdf  3. DEPARTMENT OF JUSTICE Drug Enforcement Administration 21 CFR Part 1306 [Docket No. [id number]-637] RIN [id number]-AB64 "Transfer of Electronic Prescriptions for Schedules II-V Controlled Substances Between Pharmacies for Initial Filling"  ____________________________________________________________________________________________     _______________________________________________________________________  Medication Rules  Purpose: To inform patients, and their family members, of our medication rules and regulations.  Applies to: All patients receiving prescriptions from our practice (written or electronic).  Pharmacy of record: This is the pharmacy where your electronic prescriptions will be sent. Make sure we have the correct one.  Electronic prescriptions: In compliance with the [facility] Strengthen Opioid Misuse Prevention (STOP) Act of 2017 (Session Conni Elliot [phone]), effective February 02, 2018, all controlled substances must be electronically prescribed. Written prescriptions, faxing, or calling prescriptions to a pharmacy will no longer be done.  Prescription refills: These will be provided only during in-person appointments. No medications will be renewed without a "face-to-face" evaluation with your provider. Applies to all prescriptions.  NOTE: The following applies primarily to controlled substances (Opioid* Pain Medications).   Type of encounter (visit): For patients receiving controlled substances, face-to-face visits are required. (Not an option and not up to the patient.)  Patient's responsibilities: Pain Pills: Bring all pain pills to every appointment (except for procedure appointments). Pill Bottles: Bring  pills in original pharmacy bottle. Bring bottle, even if empty. Always bring the bottle of the most recent fill.  Medication refills: You are responsible for knowing and keeping track of what medications you are taking and when is it that you will need a refill. The day before your appointment: write a list of all prescriptions that need to be refilled. The day of the appointment: give the list to the admitting nurse. Prescriptions will be written only during appointments. No prescriptions will be written on procedure days. If you forget a medication: it will not be "Called in", "Faxed", or "electronically sent". You will need to get another appointment to get these prescribed. No early refills. Do not call asking to have your prescription filled early. Partial  or short prescriptions: Occasionally your pharmacy may not have enough pills to fill your prescription.  NEVER ACCEPT a partial fill or a prescription that is short of the total amount of pills that you were prescribed.  With controlled substances the law allows 72 hours for the pharmacy to complete the prescription.  If the prescription is not completed within 72 hours, the pharmacist will require a new prescription to be written. This means that you will be short on your medicine and we WILL NOT send another prescription to complete your original prescription.  Instead, request the pharmacy to send a carrier to a nearby branch to get enough medication to provide you with your full prescription. Prescription Accuracy: You are responsible for carefully inspecting your  prescriptions before leaving our office. Have the discharge nurse carefully go over each prescription with you, before taking them home. Make sure that your name is accurately spelled, that your address is correct. Check the name and dose of your medication to make sure it is accurate. Check the number of pills, and the written instructions to make sure they are clear and accurate. Make  sure that you are given enough medication to last until your next medication refill appointment. Taking Medication: Take medication as prescribed. When it comes to controlled substances, taking less pills or less frequently than prescribed is permitted and encouraged. Never take more pills than instructed. Never take the medication more frequently than prescribed.  Inform other Doctors: Always inform, all of your healthcare providers, of all the medications you take. Pain Medication from other Providers: You are not allowed to accept any additional pain medication from any other Doctor or Healthcare provider. There are two exceptions to this rule. (see below) In the event that you require additional pain medication, you are responsible for notifying us, as stated below. Cough Medicine: Often these contain an opioid, such as codeine or hydrocodone. Never accept or take cough medicine containing these opioids if you are already taking an opioid* medication. The combination may cause respiratory failure and death. Medication Agreement: You are responsible for carefully reading and following our Medication Agreement. This must be signed before receiving any prescriptions from our practice. Safely store a copy of your signed Agreement. Violations to the Agreement will result in no further prescriptions. (Additional copies of our Medication Agreement are available upon request.) Laws, Rules, & Regulations: All patients are expected to follow all [address] and [employer], Rules, [place] [employer]. Ignorance of the Laws does not constitute a valid excuse.  Illegal drugs and Controlled Substances: The use of illegal substances (including, but not limited to marijuana and its derivatives) and/or the illegal use of any controlled substances is strictly prohibited. Violation of this rule may result in the immediate and permanent discontinuation of any and all prescriptions being written by our practice. The use of  any illegal substances is prohibited. Adopted CDC guidelines & recommendations: Target dosing levels will be at or below 60 MME/day. Use of benzodiazepines** is not recommended.  Exceptions: There are only two exceptions to the rule of not receiving pain medications from other Healthcare Providers. Exception #1 (Emergencies): In the event of an emergency (i.e.: accident requiring emergency care), you are allowed to receive additional pain medication. However, you are responsible for: As soon as you are able, call our office [phone], at any time of the day or night, and leave a message stating your name, the date and nature of the emergency, and the name and dose of the medication prescribed. In the event that your call is answered by a member of our staff, make sure to document and save the date, time, and the name of the person that took your information.  Exception #2 (Planned Surgery): In the event that you are scheduled by another doctor or dentist to have any type of surgery or procedure, you are allowed (for a period no longer than 30 days), to receive additional pain medication, for the acute post-op pain. However, in this case, you are responsible for picking up a copy of our "Post-op Pain Management for Surgeons" handout, and giving it to your surgeon or dentist. This document is available at our office, and does not require an appointment to obtain it. Simply go to  our office during business hours (Monday-Thursday from 8:00 AM to 4:00 PM) (Friday 8:00 AM to 12:00 Noon) or if you have a scheduled appointment with [place], prior to your surgery, and ask for it by name. In addition, you are responsible for: calling our office [phone], at any time of the day or night, and leaving a message stating your name, name of your surgeon, type of surgery, and date of procedure or surgery. Failure to comply with your responsibilities may result in termination of therapy involving the controlled  substances. Medication Agreement Violation. Following the above rules, including your responsibilities will help you in avoiding a Medication Agreement Violation ("Breaking your Pain Medication Contract").  Consequences:  Not following the above rules may result in permanent discontinuation of medication prescription therapy.  *Opioid medications include: morphine, codeine, oxycodone, oxymorphone, hydrocodone, hydromorphone, meperidine, tramadol, tapentadol, buprenorphine, fentanyl, methadone. **Benzodiazepine medications include: diazepam (Valium), alprazolam (Xanax), clonazepam (Klonopine), lorazepam (Ativan), clorazepate (Tranxene), chlordiazepoxide (Librium), estazolam (Prosom), oxazepam (Serax), temazepam (Restoril), triazolam (Halcion) (Last updated: 11/25/2021) ______________________________________________________________________    ______________________________________________________________________  Medication Recommendations and Reminders  Applies to: All patients receiving prescriptions (written and/or electronic).  Medication Rules & Regulations: You are responsible for reading, knowing, and following our "Medication Rules" document. These exist for your safety and that of others. They are not flexible and neither are we. Dismissing or ignoring them is an act of "non-compliance" that may result in complete and irreversible termination of such medication therapy. For safety reasons, "non-compliance" will not be tolerated. As with the [place] fundamental legal principle of "ignorance of the law is no defense", we will accept no excuses for not having read and knowing the content of documents provided to you by our practice.  Pharmacy of record:  Definition: This is the pharmacy where your electronic prescriptions will be sent.  We do not endorse any particular pharmacy. It is up to you and your insurance to decide what pharmacy to use.  We do not restrict you in your choice of  pharmacy. However, once we write for your prescriptions, we will NOT be re-sending more prescriptions to fix restricted supply problems created by your pharmacy, or your insurance.  The pharmacy listed in the electronic medical record should be the one where you want electronic prescriptions to be sent. If you choose to change pharmacy, simply notify our nursing staff. Changes will be made only during your regular appointments and not over the phone.  Recommendations: Keep all of your pain medications in a safe place, under lock and key, even if you live alone. We will NOT replace lost, stolen, or damaged medication. We do not accept "Police Reports" as proof of medications having been stolen. After you fill your prescription, take 1 week's worth of pills and put them away in a safe place. You should keep a separate, properly labeled bottle for this purpose. The remainder should be kept in the original bottle. Use this as your primary supply, until it runs out. Once it's gone, then you know that you have 1 week's worth of medicine, and it is time to come in for a prescription refill. If you do this correctly, it is unlikely that you will ever run out of medicine. To make sure that the above recommendation works, it is very important that you make sure your medication refill appointments are scheduled at least 1 week before you run out of medicine. To do this in an effective manner, make sure that you do not leave the office without  scheduling your next medication management appointment. Always ask the nursing staff to show you in your prescription , when your medication will be running out. Then arrange for the receptionist to get you a return appointment, at least 7 days before you run out of medicine. Do not wait until you have 1 or 2 pills left, to come in. This is very poor planning and does not take into consideration that we may need to cancel appointments due to bad weather, sickness, or emergencies  affecting our staff. DO NOT ACCEPT A "Partial Fill": If for any reason your pharmacy does not have enough pills/tablets to completely fill or refill your prescription, do not allow for a "partial fill". The law allows the pharmacy to complete that prescription within 72 hours, without requiring a new prescription. If they do not fill the rest of your prescription within those 72 hours, you will need a separate prescription to fill the remaining amount, which we will NOT provide. If the reason for the partial fill is your insurance, you will need to talk to the pharmacist about payment alternatives for the remaining tablets, but again, DO NOT ACCEPT A PARTIAL FILL, unless you can trust your pharmacist to obtain the remainder of the pills within 72 hours.  Prescription refills and/or changes in medication(s):  Prescription refills, and/or changes in dose or medication, will be conducted only during scheduled medication management appointments. (Applies to both, written and electronic prescriptions.) No refills on procedure days. No medication will be changed or started on procedure days. No changes, adjustments, and/or refills will be conducted on a procedure day. Doing so will interfere with the diagnostic portion of the procedure. No phone refills. No medications will be "called into the pharmacy". No Fax refills. No weekend refills. No Holliday refills. No after hours refills.  Remember:  Business hours are:  Monday to Thursday 8:00 AM to 4:00 PM Provider's Schedule: Delano Metz, MD - Appointments are:  Medication management: Monday and Wednesday 8:00 AM to 4:00 PM Procedure day: Tuesday and Thursday 7:30 AM to 4:00 PM Edward Jolly, MD - Appointments are:  Medication management: Tuesday and Thursday 8:00 AM to 4:00 PM Procedure day: Monday and Wednesday 7:30 AM to 4:00 PM (Last update: 11/25/2021) ______________________________________________________________________    ____________________________________________________________________________________________  Naloxone Nasal Spray  Why am I receiving this medication? [place] STOP ACT requires that all patients taking high dose opioids or at risk of opioids respiratory depression, be prescribed an opioid reversal agent, such as Naloxone (AKA: Narcan).  What is this medication? NALOXONE (nal OX one) treats opioid overdose, which causes slow or shallow breathing, severe drowsiness, or trouble staying awake. Call emergency services after using this medication. You may need additional treatment. Naloxone works by reversing the effects of opioids. It belongs to a group of medications called opioid blockers.  COMMON BRAND NAME(S): Kloxxado, Narcan  What should I tell my care team before I take this medication? They need to know if you have any of these conditions: Heart disease Substance use disorder An unusual or allergic reaction to naloxone, other medications, foods, dyes, or preservatives Pregnant or trying to get pregnant Breast-feeding  When to use this medication? This medication is to be used for the treatment of respiratory depression (less than 8 breaths per minute) secondary to opioid overdose.   How to use this medication? This medication is for use in the nose. Lay the person on their back. Support their neck with your hand and allow the head to tilt  back before giving the medication. The nasal spray should be given into 1 nostril. After giving the medication, move the person onto their side. Do not remove or test the nasal spray until ready to use. Get emergency medical help right away after giving the first dose of this medication, even if the person wakes up. You should be familiar with how to recognize the signs and symptoms of a narcotic overdose. If more doses are needed, give the additional dose in the other nostril. Talk to your care team about the use of this medication in children.  While this medication may be prescribed for children as young as newborns for selected conditions, precautions do apply.  Naloxone Overdosage: If you think you have taken too much of this medicine contact a poison control center or emergency room at once.  NOTE: This medicine is only for you. Do not share this medicine with others.  What if I miss a dose? This does not apply.  What may interact with this medication? This is only used during an emergency. No interactions are expected during emergency use. This list may not describe all possible interactions. Give your health care provider a list of all the medicines, herbs, non-prescription drugs, or dietary supplements you use. Also tell them if you smoke, drink alcohol, or use illegal drugs. Some items may interact with your medicine.  What should I watch for while using this medication? Keep this medication ready for use in the case of an opioid overdose. Make sure that you have the phone number of your care team and local hospital ready. You may need to have additional doses of this medication. Each nasal spray contains a single dose. Some emergencies may require additional doses. After use, bring the treated person to the nearest hospital or call 911. Make sure the treating care team knows that the person has received a dose of this medication. You will receive additional instructions on what to do during and after use of this medication before an emergency occurs.  What side effects may I notice from receiving this medication? Side effects that you should report to your care team as soon as possible: Allergic reactions--skin rash, itching, hives, swelling of the face, lips, tongue, or throat Side effects that usually do not require medical attention (report these to your care team if they continue or are bothersome): Constipation Dryness or irritation inside the nose Headache Increase in blood pressure Muscle spasms Stuffy  nose Toothache This list may not describe all possible side effects. Call your doctor for medical advice about side effects. You may report side effects to FDA at 1-800-FDA-1088.  Where should I keep my medication? Because this is an emergency medication, you should keep it with you at all times.  Keep out of the reach of children and pets. Store between 20 and 25 degrees C (68 and 77 degrees F). Do not freeze. Throw away any unused medication after the expiration date. Keep in original box until ready to use.  NOTE: This sheet is a summary. It may not cover all possible information. If you have questions about this medicine, talk to your doctor, pharmacist, or health care provider.   2023 Elsevier/Gold Standard (2020-09-27 00:00:00)  ____________________________________________________________________________________________

## 2022-07-13 NOTE — Progress Notes (Signed)
Nursing Pain Medication Assessment:  Safety precautions to be maintained throughout the outpatient stay will include: orient to surroundings, keep bed in low position, maintain call bell within reach at all times, provide assistance with transfer out of bed and ambulation.  Medication Inspection Compliance: Pill count conducted under aseptic conditions, in front of the patient. Neither the pills nor the bottle was removed from the patient's sight at any time. Once count was completed pills were immediately returned to the patient in their original bottle.  Medication: Oxycodone IR Pill/Patch Count:  18 of 120 pills remain Pill/Patch Appearance: Markings consistent with prescribed medication Bottle Appearance: Standard pharmacy container. Clearly labeled. Filled Date: 05 / 15 / 2024 Last Medication intake:  Today

## 2022-07-16 LAB — TOXASSURE SELECT 13 (MW), URINE

## 2022-09-01 ENCOUNTER — Telehealth: Payer: Self-pay | Admitting: Pain Medicine

## 2022-09-01 NOTE — Telephone Encounter (Signed)
Patient had teeth work done and received script for 8 Oxycodone 5mg . She brought in paperwork to show what she had done and what she received. This will be sent to scan.

## 2022-10-01 NOTE — Progress Notes (Unsigned)
PROVIDER NOTE: Information contained herein reflects review and annotations entered in association with encounter. Interpretation of such information and data should be left to medically-trained personnel. Information provided to patient can be located elsewhere in the medical record under "Patient Instructions". Document created using STT-dictation technology, any transcriptional errors that may result from process are unintentional.    Patient: Holly Spears  Service Category: E/M  Provider: Oswaldo Done, MD  DOB: Jun 12, 1970  DOS: 10/07/2022  Referring Provider: Rhea Spears  MRN: [id number]  Specialty: Interventional Pain Management  PCP: Holly Maltese, PA-C  Type: Established Patient  Setting: Ambulatory outpatient    Location: Office  Delivery: Face-to-face     HPI  [name], a 52 y.o. year old female, is here today because of her Chronic pain syndrome [G89.4]. [name]'s primary complain today is No chief complaint on file.  Pertinent problems: 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; Lumbar spondylosis (L3-4, L4-5, and L5-S1); Lumbar bulging disc (L3-4, L4-5, and L5-S1); Chronic lumbar radicular pain (Bilateral) (R>L); Chronic knee pain (3ry area of Pain) (Bilateral) (R>L); Chronic upper back pain (Left); Thoracic disc herniation (Large, Left paracentral T10-11 disc herniation); Thoracic spinal stenosis (Severe, Left, T10-11  Lateral Recess Stenosis); Thoracic foraminal stenosis (Severe Left T10-11); History of bilateral carpal tunnel release (Bilateral); Chronic hand pain (Bilateral) (R>L); Muscle weakness; Chronic lower extremity pain (2ry area of Pain) (Bilateral); Neck pain; Spasm of back muscles; Myalgia and myositis; Fibromyalgia; Sjogren's syndrome (HCC); DDD (degenerative disc disease), cervical; Right cervical radiculopathy (C8); Abnormal MRI, cervical spine (10/16/2021); Enthesopathy of shoulder (Right); Trigger point of shoulder region (Right); Pain in shoulder region (Right); and Arthropathy of shoulder (Right) on their pertinent problem list. Pain Assessment: Severity of   is reported as a  /10. Location:    / . Onset:  . Quality:  . Timing:  . Modifying factor(s):  Marland Kitchen Vitals:  vitals were not taken for this visit.  BMI: Estimated body mass index is 27.44 kg/m as calculated from the following:   Height as of 07/13/22: 5\' 6"  (1.676 m).   Weight as of 07/13/22: 170 lb (77.1 kg). Last encounter: 07/13/2022. Last procedure: 01/01/2022.  Reason for encounter: medication management. ***  RTCB: 01/13/2023   Pharmacotherapy Assessment  Analgesic: Oxycodone IR 10 mg, 1 tab PO q 6 hrs (40 mg/day of oxycodone) MME/day: 60 mg/day.   Monitoring: [place] PMP: PDMP reviewed during this encounter.       Pharmacotherapy: No side-effects or adverse reactions reported. Compliance: No problems identified. Effectiveness: Clinically acceptable.  No notes on file  No results found for: "CBDTHCR" No results found for: "D8THCCBX" No results found for: "D9THCCBX"  UDS:  Summary  Date Value Ref Range Status  07/13/2022 Note  Final    Comment:    ==================================================================== [facility] (MW) ==================================================================== Test                             Result       Flag  Units  Drug Present and Declared for Prescription  Verification   Oxycodone                      1491         EXPECTED   ng/mg creat   Oxymorphone                    683          EXPECTED   ng/mg creat   Noroxycodone                   4491         EXPECTED   ng/mg creat   Noroxymorphone                 322          EXPECTED   ng/mg creat    Sources of oxycodone are scheduled prescription medications.    Oxymorphone, noroxycodone, and noroxymorphone are expected    metabolites of oxycodone. Oxymorphone is also available as a    scheduled prescription medication.  ==================================================================== Test                      Result    Flag   Units      Ref Range   Creatinine              23               mg/dL      >=16 ==================================================================== Declared Medications:  The flagging and interpretation on this report are based on the  following declared medications.  Unexpected results may arise from  inaccuracies in the declared medications.   **Note: The testing scope of this panel includes these medications:   Oxycodone   **Note: The testing scope of this panel does not include the  following reported medications:   Biotin  Bupropion (Wellbutrin)  Calcium  Citalopram (Celexa)  Cyclobenzaprine (Flexeril)  Epinephrine (EpiPen)  Famotidine (Pepcid)  Fluoride (Prevident)  Ibuprofen (Advil)  Levothyroxine (Synthroid)  Magnesium  Multivitamin  Naloxone (Narcan)  Nifedipine (Procardia)  Potassium (Klor-Con)  Vitamin D  Vitamin D3  Zinc ==================================================================== For clinical consultation, please call [phone]. ====================================================================       ROS  Constitutional: Denies any fever or chills Gastrointestinal: No reported hemesis, hematochezia, vomiting, or acute GI distress Musculoskeletal: Denies any acute onset joint swelling, redness, loss of ROM, or  weakness Neurological: No reported episodes of acute onset apraxia, aphasia, dysarthria, agnosia, amnesia, paralysis, loss of coordination, or loss of consciousness  Medication Review  Biotin, Calcium Carbonate-Vitamin D, EPINEPHrine, Magnesium, Multi-Vitamins, NIFEdipine, Oxycodone HCl, Sodium Fluoride, Vitamin D3, Zinc, buPROPion, citalopram, cyclobenzaprine, famotidine, ibuprofen, levothyroxine, naloxone, and potassium chloride SA  History Review  Allergy: [name] is allergic to bee venom, penicillins, shellfish allergy, sulfa antibiotics, erythromycin, zaleplon, codeine, and trazodone. Drug: [name]  reports no history of drug use. Alcohol:  reports no history of alcohol use. Tobacco:  reports that she has quit smoking. She has never used smokeless tobacco. Social: [name]  reports that she has quit smoking. She has never used smokeless tobacco. She reports that she does not drink alcohol and does not use drugs. Medical:  has a past medical history of Acid reflux (12/15/2011), Anxiety, generalized (12/15/2011), Arthritis, Cervical pain (12/15/2011), Depression, GERD (gastroesophageal reflux disease), Hiatal hernia, LBP (low back pain) (12/15/2011), Migraine, Muscle ache (12/15/2011), Osteopenia of left hip, Pain (  12/27/2012), Rheumatoid arthritis (HCC) (12/15/2011), and Scratched cornea (10/25/2015). Surgical: [name]  has a past surgical history that includes Cholecystectomy and Abdominal hysterectomy. Family: family history includes Depression in her mother; Diabetes in her father; Hyperlipidemia in her mother; Hypertension in her father and mother.  Laboratory Chemistry Profile   Renal Lab Results  Component Value Date   BUN 6 03/03/2018   CREATININE 0.63 03/03/2018   BCR 10 03/03/2018   GFRAA 123 03/03/2018   GFRNONAA 106 03/03/2018    Hepatic Lab Results  Component Value Date   AST 21 03/03/2018   ALT 26 03/27/2015   ALBUMIN 4.6 03/03/2018   ALKPHOS 71 03/03/2018     Electrolytes Lab Results  Component Value Date   NA 136 03/03/2018   K 3.9 03/03/2018   CL 97 03/03/2018   CALCIUM 8.9 03/03/2018   MG 2.1 03/03/2018    Bone Lab Results  Component Value Date   VD25OH 29.3 (L) 12/31/2014    Inflammation (CRP: Acute Phase) (ESR: Chronic Phase) Lab Results  Component Value Date   CRP <1 03/03/2018   ESRSEDRATE 2 03/03/2018         Note: Above Lab results reviewed.  Recent Imaging Review  DG PAIN CLINIC C-ARM 1-60 MIN NO REPORT Fluoro was used, but no Radiologist interpretation will be provided.  Please refer to "NOTES" tab for provider progress note. Note: Reviewed        Physical Exam  General appearance: Well nourished, well developed, and well hydrated. In no apparent acute distress Mental status: Alert, oriented x 3 (person, place, & time)       Respiratory: No evidence of acute respiratory distress Eyes: PERLA Vitals: There were no vitals taken for this visit. BMI: Estimated body mass index is 27.44 kg/m as calculated from the following:   Height as of 07/13/22: 5\' 6"  (1.676 m).   Weight as of 07/13/22: 170 lb (77.1 kg). Ideal: Patient weight not recorded  Assessment   Diagnosis Status  1. Chronic pain syndrome   2. Chronic low back pain (1ry area of Pain) (Bilateral) (R>L) w/o sciatica   3. Chronic lower extremity pain (2ry area of Pain) (Bilateral)   4. Chronic knee pain (3ry area of Pain) (Bilateral) (R>L)   5. Chronic neck pain (4th area of Pain)   6. Chronic shoulder pain (5th area of Pain) (Bilateral) (R>L)   7. Failed back surgical syndrome   8. Chronic musculoskeletal pain   9. Arthropathy of shoulder (Right)   10. Pharmacologic therapy   11. Chronic use of opiate for therapeutic purpose   12. Encounter for medication management   13. Encounter for chronic pain management    Controlled Controlled Controlled   Updated Problems: No problems updated.  Plan of Care  Problem-specific:  No problem-specific  Assessment & Plan notes found for this encounter.  [name] has a current medication list which includes the following long-term medication(s): bupropion, calcium carbonate-vitamin d, citalopram, cyclobenzaprine, famotidine, levothyroxine, naloxone, nifedipine, and oxycodone hcl.  Pharmacotherapy (Medications Ordered): No orders of the defined types were placed in this encounter.  Orders:  No orders of the defined types were placed in this encounter.  Follow-up plan:   No follow-ups on file.      Interventional Therapies  Risk Factors  Complex Considerations:   The patient had a bad experience with another physician receiving blocks.   Planned  Pending:      Under consideration:   Diagnostic bilateral lumbar facet block  Possible bilateral lumbar facet RFA  Diagnostic right L5-S1 LESI  Possible right L5 vs S1 TFESI  Diagnostic bilateral IA knee injections (w/ steroid)  Possible series of 5 IA Hyalgan knee injections  Diagnostic bilateral Genicular NB  Possible bilateral Genicular nerve RFA    Completed:   Diagnostic right suprascapular NB x1 (11/25/2021) (100/80/80/80)  Therapeutic right suprascapular nerve RFA #1 (01/01/2022) (100/100/75/pain flareup/75)    Therapeutic  Palliative (PRN) options:   None.   Pharmacotherapy  Nonopioids transferred 11/22/2019: Flexeril         Recent Visits Date Type Provider Dept  07/13/22 Office Visit Delano Metz, MD [facility]  Showing recent visits within past 90 days and meeting all other requirements Future Appointments Date Type Provider Dept  10/07/22 Appointment Delano Metz, MD [facility]  Showing future appointments within next 90 days and meeting all other requirements  I discussed the assessment and treatment plan with the patient. The patient was provided an opportunity to ask questions and all were answered. The patient agreed with the plan and demonstrated an  understanding of the instructions.  Patient advised to call back or seek an in-person evaluation if the symptoms or condition worsens.  Duration of encounter: *** minutes.  Total time on encounter, as per AMA guidelines included both the face-to-face and non-face-to-face time personally spent by the physician and/or other qualified health care professional(s) on the day of the encounter (includes time in activities that require the physician or other qualified health care professional and does not include time in activities normally performed by clinical staff). Physician's time may include the following activities when performed: Preparing to see the patient (e.g., pre-charting review of records, searching for previously ordered imaging, lab work, and nerve conduction tests) Review of prior analgesic pharmacotherapies. Reviewing PMP Interpreting ordered tests (e.g., lab work, imaging, nerve conduction tests) Performing post-procedure evaluations, including interpretation of diagnostic procedures Obtaining and/or reviewing separately obtained history Performing a medically appropriate examination and/or evaluation Counseling and educating the patient/family/caregiver Ordering medications, tests, or procedures Referring and communicating with other health care professionals (when not separately reported) Documenting clinical information in the electronic or other health record Independently interpreting results (not separately reported) and communicating results to the patient/ family/caregiver Care coordination (not separately reported)  Note by: Holly Done, MD Date: 10/07/2022; Time: 1:40 PM

## 2022-10-04 NOTE — Patient Instructions (Signed)
 ____________________________________________________________________________________________  Opioid Pain Medication Update  To: All patients taking opioid pain medications. (I.e.: hydrocodone, hydromorphone, oxycodone, oxymorphone, morphine, codeine, methadone, tapentadol, tramadol, buprenorphine, fentanyl, etc.)  Re: Updated review of side effects and adverse reactions of opioid analgesics, as well as new information about long term effects of this class of medications.  Direct risks of long-term opioid therapy are not limited to opioid addiction and overdose. Potential medical risks include serious fractures, breathing problems during sleep, hyperalgesia, immunosuppression, chronic constipation, bowel obstruction, myocardial infarction, and tooth decay secondary to xerostomia.  Unpredictable adverse effects that can occur even if you take your medication correctly: Cognitive impairment, respiratory depression, and death. Most people think that if they take their medication "correctly", and "as instructed", that they will be safe. Nothing could be farther from the truth. In reality, a significant amount of recorded deaths associated with the use of opioids has occurred in individuals that had taken the medication for a long time, and were taking their medication correctly. The following are examples of how this can happen: Patient taking his/her medication for a long time, as instructed, without any side effects, is given a certain antibiotic or another unrelated medication, which in turn triggers a "Drug-to-drug interaction" leading to disorientation, cognitive impairment, impaired reflexes, respiratory depression or an untoward event leading to serious bodily harm or injury, including death.  Patient taking his/her medication for a long time, as instructed, without any side effects, develops an acute impairment of liver and/or kidney function. This will lead to a rapid inability of the body to  breakdown and eliminate their pain medication, which will result in effects similar to an "overdose", but with the same medicine and dose that they had always taken. This again may lead to disorientation, cognitive impairment, impaired reflexes, respiratory depression or an untoward event leading to serious bodily harm or injury, including death.  A similar problem will occur with patients as they grow older and their liver and kidney function begins to decrease as part of the aging process.  Background information: Historically, the original case for using long-term opioid therapy to treat chronic noncancer pain was based on safety assumptions that subsequent experience has called into question. In 1996, the American Pain Society and the American Academy of Pain Medicine issued a consensus statement supporting long-term opioid therapy. This statement acknowledged the dangers of opioid prescribing but concluded that the risk for addiction was low; respiratory depression induced by opioids was short-lived, occurred mainly in opioid-naive patients, and was antagonized by pain; tolerance was not a common problem; and efforts to control diversion should not constrain opioid prescribing. This has now proven to be wrong. Experience regarding the risks for opioid addiction, misuse, and overdose in community practice has failed to support these assumptions.  According to the Centers for Disease Control and Prevention, fatal overdoses involving opioid analgesics have increased sharply over the past decade. Currently, more than 96,700 people die from drug overdoses every year. Opioids are a factor in 7 out of every 10 overdose deaths. Deaths from drug overdose have surpassed motor vehicle accidents as the leading cause of death for individuals between the ages of 80 and 61.  Clinical data suggest that neuroendocrine dysfunction may be very common in both men and women, potentially causing hypogonadism, erectile  dysfunction, infertility, decreased libido, osteoporosis, and depression. Recent studies linked higher opioid dose to increased opioid-related mortality. Controlled observational studies reported that long-term opioid therapy may be associated with increased risk for cardiovascular events. Subsequent meta-analysis concluded  that the safety of long-term opioid therapy in elderly patients has not been proven.   Side Effects and adverse reactions: Common side effects: Drowsiness (sedation). Dizziness. Nausea and vomiting. Constipation. Physical dependence -- Dependence often manifests with withdrawal symptoms when opioids are discontinued or decreased. Tolerance -- As you take repeated doses of opioids, you require increased medication to experience the same effect of pain relief. Respiratory depression -- This can occur in healthy people, especially with higher doses. However, people with COPD, asthma or other lung conditions may be even more susceptible to fatal respiratory impairment.  Uncommon side effects: An increased sensitivity to feeling pain and extreme response to pain (hyperalgesia). Chronic use of opioids can lead to this. Delayed gastric emptying (the process by which the contents of your stomach are moved into your small intestine). Muscle rigidity. Immune system and hormonal dysfunction. Quick, involuntary muscle jerks (myoclonus). Arrhythmia. Itchy skin (pruritus). Dry mouth (xerostomia).  Long-term side effects: Chronic constipation. Sleep-disordered breathing (SDB). Increased risk of bone fractures. Hypothalamic-pituitary-adrenal dysregulation. Increased risk of overdose.  RISKS: Respiratory depression and death: Opioids increase the risk of respiratory depression and death.  Drug-to-drug interactions: Opioids are relatively contraindicated in combination with benzodiazepines, sleep inducers, and other central nervous system depressants. Other classes of medications  (i.e.: certain antibiotics and even over-the-counter medications) may also trigger or induce respiratory depression in some patients.  Medical conditions: Patients with pre-existing respiratory problems are at higher risk of respiratory failure and/or depression when in combination with opioid analgesics. Opioids are relatively contraindicated in some medical conditions such as central sleep apnea.   Fractures and Falls:  Opioids increase the risk and incidence of falls. This is of particular importance in elderly patients.  Endocrine System:  Long-term administration is associated with endocrine abnormalities (endocrinopathies). (Also known as Opioid-induced Endocrinopathy) Influences on both the hypothalamic-pituitary-adrenal axis?and the hypothalamic-pituitary-gonadal axis have been demonstrated with consequent hypogonadism and adrenal insufficiency in both sexes. Hypogonadism and decreased levels of dehydroepiandrosterone sulfate have been reported in men and women. Endocrine effects include: Amenorrhoea in women (abnormal absence of menstruation) Reduced libido in both sexes Decreased sexual function Erectile dysfunction in men Hypogonadisms (decreased testicular function with shrinkage of testicles) Infertility Depression and fatigue Loss of muscle mass Anxiety Depression Immune suppression Hyperalgesia Weight gain Anemia Osteoporosis Patients (particularly women of childbearing age) should avoid opioids. There is insufficient evidence to recommend routine monitoring of asymptomatic patients taking opioids in the long-term for hormonal deficiencies.  Immune System: Human studies have demonstrated that opioids have an immunomodulating effect. These effects are mediated via opioid receptors both on immune effector cells and in the central nervous system. Opioids have been demonstrated to have adverse effects on antimicrobial response and anti-tumour surveillance. Buprenorphine has  been demonstrated to have no impact on immune function.  Opioid Induced Hyperalgesia: Human studies have demonstrated that prolonged use of opioids can lead to a state of abnormal pain sensitivity, sometimes called opioid induced hyperalgesia (OIH). Opioid induced hyperalgesia is not usually seen in the absence of tolerance to opioid analgesia. Clinically, hyperalgesia may be diagnosed if the patient on long-term opioid therapy presents with increased pain. This might be qualitatively and anatomically distinct from pain related to disease progression or to breakthrough pain resulting from development of opioid tolerance. Pain associated with hyperalgesia tends to be more diffuse than the pre-existing pain and less defined in quality. Management of opioid induced hyperalgesia requires opioid dose reduction.  Cancer: Chronic opioid therapy has been associated with an increased risk of cancer  among noncancer patients with chronic pain. This association was more evident in chronic strong opioid users. Chronic opioid consumption causes significant pathological changes in the small intestine and colon. Epidemiological studies have found that there is a link between opium dependence and initiation of gastrointestinal cancers. Cancer is the second leading cause of death after cardiovascular disease. Chronic use of opioids can cause multiple conditions such as GERD, immunosuppression and renal damage as well as carcinogenic effects, which are associated with the incidence of cancers.   Mortality: Long-term opioid use has been associated with increased mortality among patients with chronic non-cancer pain (CNCP).  Prescription of long-acting opioids for chronic noncancer pain was associated with a significantly increased risk of all-cause mortality, including deaths from causes other than overdose.  Reference: Von Korff M, Kolodny A, Deyo RA, Chou R. Long-term opioid therapy reconsidered. Ann Intern Med. 2011  Sep 6;155(5):325-8. doi: 10.7326/[id number]. PMID: 64403474; PMCID: [id number]. Randon Goldsmith, Hayward RA, Dunn KM, [place] KP. Risk of adverse events in patients prescribed long-term opioids: A cohort study in the [place] [facility] Research Datalink. Eur J Pain. 2019 May;23(5):908-922. doi: 10.1002/ejp.1357. Epub 2019 Jan 31. PMID: 43329518. Colameco S, Coren JS, Ciervo CA. Continuous opioid treatment for chronic noncancer pain: a time for moderation in prescribing. Postgrad Med. 2009 Jul;121(4):61-6. doi: 10.3810/pgm.2009.07.2032. PMID: 84166063. William Hamburger RN, [place] SD, Blazina I, Cristopher Peru, Bougatsos C, Deyo RA. The effectiveness and risks of long-term opioid therapy for chronic pain: a systematic review for a [employer] of [employer] Pathways to [employer]. Ann Intern Med. 2015 Feb 17;162(4):276-86. doi: 10.7326/M14-2559. PMID: 01601093. Caryl Bis [facility], Makuc DM. NCHS Data Brief No. 22. [place]: Centers for Disease Control and Prevention; 2009. Sep, Increase in Fatal Poisonings Involving Opioid Analgesics in the [place], 1999-2006. Song IA, Choi HR, Oh TK. Long-term opioid use and mortality in patients with chronic non-cancer pain: Ten-year follow-up study in [place] from 2010 through 2019. EClinicalMedicine. 2022 Jul 18;51:101558. doi: 10.1016/j.eclinm.2022.235573. PMID: 22025427; PMCID: [id number]. Huser, W., Schubert, T., Vogelmann, T. et al. All-cause mortality in patients with long-term opioid therapy compared with non-opioid analgesics for chronic non-cancer pain: a database study. BMC Med 18, 162 (2020). http://lester.info/ Rashidian H, Karie Kirks, Malekzadeh R, Haghdoost AA. An Ecological Study of the Association between Opiate Use and Incidence of Cancers. Addict Health. 2016 Fall;8(4):252-260. PMID: 51761607; PMCID: [id number].  Our Goal: Our goal is to control your  pain with means other than the use of opioid pain medications.  Our Recommendation: Talk to your physician about coming off of these medications. We can assist you with the tapering down and stopping these medicines. Based on the new information, even if you cannot completely stop the medication, a decrease in the dose may be associated with a lesser risk. Ask for other means of controlling the pain. Decrease or eliminate those factors that significantly contribute to your pain such as smoking, obesity, and a diet heavily tilted towards "inflammatory" nutrients.  Last Updated: 08/10/2022   ____________________________________________________________________________________________     ____________________________________________________________________________________________  National Pain Medication Shortage  The [place] is experiencing worsening drug shortages. These have had a negative widespread effect on patient care and treatment. Not expected to improve any time soon. Predicted to last past 2029.   Drug shortage list (generic names) Oxycodone IR Oxycodone/APAP Oxymorphone IR Hydromorphone Hydrocodone/APAP Morphine  Where is the problem?  Manufacturing and supply level.  Will this shortage affect you?  Only if you  take any of the above pain medications.  How? You may be unable to fill your prescription.  Your pharmacist may offer a "partial fill" of your prescription. (Warning: Do not accept partial fills.) Prescriptions partially filled cannot be transferred to another pharmacy. Read our Medication Rules and Regulation. Depending on how much medicine you are dependent on, you may experience withdrawals when unable to get the medication.  Recommendations: Consider ending your dependence on opioid pain medications. Ask your pain specialist to assist you with the process. Consider switching to a medication currently not in shortage, such as Buprenorphine. Talk to your pain  specialist about this option. Consider decreasing your pain medication requirements by managing tolerance thru "Drug Holidays". This may help minimize withdrawals, should you run out of medicine. Control your pain thru the use of non-pharmacological interventional therapies.   Your prescriber: Prescribers cannot be blamed for shortages. Medication manufacturing and supply issues cannot be fixed by the prescriber.   NOTE: The prescriber is not responsible for supplying the medication, or solving supply issues. Work with your pharmacist to solve it. The patient is responsible for the decision to take or continue taking the medication and for identifying and securing a legal supply source. By law, supplying the medication is the job and responsibility of the pharmacy. The prescriber is responsible for the evaluation, monitoring, and prescribing of these medications.   Prescribers will NOT: Re-issue prescriptions that have been partially filled. Re-issue prescriptions already sent to a pharmacy.  Re-send prescriptions to a different pharmacy because yours did not have your medication. Ask pharmacist to order more medicine or transfer the prescription to another pharmacy. (Read below.)  New 2023 regulation: "October 03, 2021 Revised Regulation Allows DEA-Registered Pharmacies to Transfer Electronic Prescriptions at a Patient's Request DEA Headquarters Division - Public Information Office Patients now have the ability to request their electronic prescription be transferred to another pharmacy without having to go back to their practitioner to initiate the request. This revised regulation went into effect on Monday, September 29, 2021.     At a patient's request, a DEA-registered retail pharmacy can now transfer an electronic prescription for a controlled substance (schedules II-V) to another DEA-registered retail pharmacy. Prior to this change, patients would have to go through their practitioner to  cancel their prescription and have it re-issued to a different pharmacy. The process was taxing and time consuming for both patients and practitioners.    The Drug Enforcement Administration [facility]) published its intent to revise the process for transferring electronic prescriptions on December 22, 2019.  The final rule was published in the federal register on August 28, 2021 and went into effect 30 days later.  Under the final rule, a prescription can only be transferred once between pharmacies, and only if allowed under existing state or other applicable law. The prescription must remain in its electronic form; may not be altered in any way; and the transfer must be communicated directly between two licensed pharmacists. It's important to note, any authorized refills transfer with the original prescription, which means the entire prescription will be filled at the same pharmacy".  Reference: HugeHand.is [facility] website announcement)  [email or web address] [employer] of Justice)   [employer] / Vol. 88, No. 143 / Thursday, August 28, 2021 / Rules and Regulations DEPARTMENT OF JUSTICE  Drug Enforcement Administration  21 CFR Part 1306  [Docket No. [id number]]  RIN [id number] Transfer of Electronic Prescriptions for Schedules II-V Controlled Substances Between Pharmacies for Initial Filling  ____________________________________________________________________________________________  ____________________________________________________________________________________________  Transfer of Pain Medication between Pharmacies  Re: 2023 DEA Clarification on existing regulation  Published on DEA Website: October 03, 2021  Title: Revised Regulation Allows DEA-Registered Pharmacies to Electrical engineer Prescriptions at a Patient's  Request DEA Headquarters Division - [employer]  "Patients now have the ability to request their electronic prescription be transferred to another pharmacy without having to go back to their practitioner to initiate the request. This revised regulation went into effect on Monday, September 29, 2021.     At a patient's request, a DEA-registered retail pharmacy can now transfer an electronic prescription for a controlled substance (schedules II-V) to another DEA-registered retail pharmacy. Prior to this change, patients would have to go through their practitioner to cancel their prescription and have it re-issued to a different pharmacy. The process was taxing and time consuming for both patients and practitioners.    The Drug Enforcement Administration [facility]) published its intent to revise the process for transferring electronic prescriptions on December 22, 2019.  The final rule was published in the federal register on August 28, 2021 and went into effect 30 days later.  Under the final rule, a prescription can only be transferred once between pharmacies, and only if allowed under existing state or other applicable law. The prescription must remain in its electronic form; may not be altered in any way; and the transfer must be communicated directly between two licensed pharmacists. It's important to note, any authorized refills transfer with the original prescription, which means the entire prescription will be filled at the same pharmacy."    REFERENCES: 1. DEA website announcement [email or web address]  2. Department of Justice website  [email or web address].pdf  3. DEPARTMENT OF JUSTICE Drug Enforcement Administration 21 CFR Part 1306 [Docket No. [id number]] RIN [id number] "Transfer of Electronic Prescriptions for Schedules II-V Controlled Substances  Between Pharmacies for Initial Filling"  ____________________________________________________________________________________________     _______________________________________________________________________  Medication Rules  Purpose: To inform patients, and their family members, of our medication rules and regulations.  Applies to: All patients receiving prescriptions from our practice (written or electronic).  Pharmacy of record: This is the pharmacy where your electronic prescriptions will be sent. Make sure we have the correct one.  Electronic prescriptions: In compliance with the [facility] Strengthen Opioid Misuse Prevention (STOP) Act of 2017 (Session Conni Elliot [phone]), effective February 02, 2018, all controlled substances must be electronically prescribed. Written prescriptions, faxing, or calling prescriptions to a pharmacy will no longer be done.  Prescription refills: These will be provided only during in-person appointments. No medications will be renewed without a "face-to-face" evaluation with your provider. Applies to all prescriptions.  NOTE: The following applies primarily to controlled substances (Opioid* Pain Medications).   Type of encounter (visit): For patients receiving controlled substances, face-to-face visits are required. (Not an option and not up to the patient.)  Patient's responsibilities: Pain Pills: Bring all pain pills to every appointment (except for procedure appointments). Pill Bottles: Bring pills in original pharmacy bottle. Bring bottle, even if empty. Always bring the bottle of the most recent fill.  Medication refills: You are responsible for knowing and keeping track of what medications you are taking and when is it that you will need a refill. The day before your appointment: write a list of all prescriptions that need to be refilled. The day of the appointment: give the list to the admitting nurse. Prescriptions will be written only  during appointments. No prescriptions will be written on procedure days. If you forget a  medication: it will not be "Called in", "Faxed", or "electronically sent". You will need to get another appointment to get these prescribed. No early refills. Do not call asking to have your prescription filled early. Partial  or short prescriptions: Occasionally your pharmacy may not have enough pills to fill your prescription.  NEVER ACCEPT a partial fill or a prescription that is short of the total amount of pills that you were prescribed.  With controlled substances the law allows 72 hours for the pharmacy to complete the prescription.  If the prescription is not completed within 72 hours, the pharmacist will require a new prescription to be written. This means that you will be short on your medicine and we WILL NOT send another prescription to complete your original prescription.  Instead, request the pharmacy to send a carrier to a nearby branch to get enough medication to provide you with your full prescription. Prescription Accuracy: You are responsible for carefully inspecting your prescriptions before leaving our office. Have the discharge nurse carefully go over each prescription with you, before taking them home. Make sure that your name is accurately spelled, that your address is correct. Check the name and dose of your medication to make sure it is accurate. Check the number of pills, and the written instructions to make sure they are clear and accurate. Make sure that you are given enough medication to last until your next medication refill appointment. Taking Medication: Take medication as prescribed. When it comes to controlled substances, taking less pills or less frequently than prescribed is permitted and encouraged. Never take more pills than instructed. Never take the medication more frequently than prescribed.  Inform other Doctors: Always inform, all of your healthcare providers, of all the  medications you take. Pain Medication from other Providers: You are not allowed to accept any additional pain medication from any other Doctor or Healthcare provider. There are two exceptions to this rule. (see below) In the event that you require additional pain medication, you are responsible for notifying us, as stated below. Cough Medicine: Often these contain an opioid, such as codeine or hydrocodone. Never accept or take cough medicine containing these opioids if you are already taking an opioid* medication. The combination may cause respiratory failure and death. Medication Agreement: You are responsible for carefully reading and following our Medication Agreement. This must be signed before receiving any prescriptions from our practice. Safely store a copy of your signed Agreement. Violations to the Agreement will result in no further prescriptions. (Additional copies of our Medication Agreement are available upon request.) Laws, Rules, & Regulations: All patients are expected to follow all [address] and [employer], [employer], Rules, [place] [place]. Ignorance of the Laws does not constitute a valid excuse.  Illegal drugs and Controlled Substances: The use of illegal substances (including, but not limited to marijuana and its derivatives) and/or the illegal use of any controlled substances is strictly prohibited. Violation of this rule may result in the immediate and permanent discontinuation of any and all prescriptions being written by our practice. The use of any illegal substances is prohibited. Adopted CDC guidelines & recommendations: Target dosing levels will be at or below 60 MME/day. Use of benzodiazepines** is not recommended.  Exceptions: There are only two exceptions to the rule of not receiving pain medications from other Healthcare Providers. Exception #1 (Emergencies): In the event of an emergency (i.e.: accident requiring emergency care), you are allowed to receive additional pain  medication. However, you are responsible for: As soon as  you are able, call our office [phone], at any time of the day or night, and leave a message stating your name, the date and nature of the emergency, and the name and dose of the medication prescribed. In the event that your call is answered by a member of our staff, make sure to document and save the date, time, and the name of the person that took your information.  Exception #2 (Planned Surgery): In the event that you are scheduled by another doctor or dentist to have any type of surgery or procedure, you are allowed (for a period no longer than 30 days), to receive additional pain medication, for the acute post-op pain. However, in this case, you are responsible for picking up a copy of our "Post-op Pain Management for Surgeons" handout, and giving it to your surgeon or dentist. This document is available at our office, and does not require an appointment to obtain it. Simply go to our office during business hours (Monday-Thursday from 8:00 AM to 4:00 PM) (Friday 8:00 AM to 12:00 Noon) or if you have a scheduled appointment with [place], prior to your surgery, and ask for it by name. In addition, you are responsible for: calling our office [phone], at any time of the day or night, and leaving a message stating your name, name of your surgeon, type of surgery, and date of procedure or surgery. Failure to comply with your responsibilities may result in termination of therapy involving the controlled substances. Medication Agreement Violation. Following the above rules, including your responsibilities will help you in avoiding a Medication Agreement Violation ("Breaking your Pain Medication Contract").  Consequences:  Not following the above rules may result in permanent discontinuation of medication prescription therapy.  *Opioid medications include: morphine, codeine, oxycodone, oxymorphone, hydrocodone, hydromorphone, meperidine, tramadol,  tapentadol, buprenorphine, fentanyl, methadone. **Benzodiazepine medications include: diazepam (Valium), alprazolam (Xanax), clonazepam (Klonopine), lorazepam (Ativan), clorazepate (Tranxene), chlordiazepoxide (Librium), estazolam (Prosom), oxazepam (Serax), temazepam (Restoril), triazolam (Halcion) (Last updated: 11/25/2021) ______________________________________________________________________    ______________________________________________________________________  Medication Recommendations and Reminders  Applies to: All patients receiving prescriptions (written and/or electronic).  Medication Rules & Regulations: You are responsible for reading, knowing, and following our "Medication Rules" document. These exist for your safety and that of others. They are not flexible and neither are we. Dismissing or ignoring them is an act of "non-compliance" that may result in complete and irreversible termination of such medication therapy. For safety reasons, "non-compliance" will not be tolerated. As with the [place] fundamental legal principle of "ignorance of the law is no defense", we will accept no excuses for not having read and knowing the content of documents provided to you by our practice.  Pharmacy of record:  Definition: This is the pharmacy where your electronic prescriptions will be sent.  We do not endorse any particular pharmacy. It is up to you and your insurance to decide what pharmacy to use.  We do not restrict you in your choice of pharmacy. However, once we write for your prescriptions, we will NOT be re-sending more prescriptions to fix restricted supply problems created by your pharmacy, or your insurance.  The pharmacy listed in the electronic medical record should be the one where you want electronic prescriptions to be sent. If you choose to change pharmacy, simply notify our nursing staff. Changes will be made only during your regular appointments and not over the  phone.  Recommendations: Keep all of your pain medications in a safe place, under lock and key, even  if you live alone. We will NOT replace lost, stolen, or damaged medication. We do not accept "Police Reports" as proof of medications having been stolen. After you fill your prescription, take 1 week's worth of pills and put them away in a safe place. You should keep a separate, properly labeled bottle for this purpose. The remainder should be kept in the original bottle. Use this as your primary supply, until it runs out. Once it's gone, then you know that you have 1 week's worth of medicine, and it is time to come in for a prescription refill. If you do this correctly, it is unlikely that you will ever run out of medicine. To make sure that the above recommendation works, it is very important that you make sure your medication refill appointments are scheduled at least 1 week before you run out of medicine. To do this in an effective manner, make sure that you do not leave the office without scheduling your next medication management appointment. Always ask the nursing staff to show you in your prescription , when your medication will be running out. Then arrange for the receptionist to get you a return appointment, at least 7 days before you run out of medicine. Do not wait until you have 1 or 2 pills left, to come in. This is very poor planning and does not take into consideration that we may need to cancel appointments due to bad weather, sickness, or emergencies affecting our staff. DO NOT ACCEPT A "Partial Fill": If for any reason your pharmacy does not have enough pills/tablets to completely fill or refill your prescription, do not allow for a "partial fill". The law allows the pharmacy to complete that prescription within 72 hours, without requiring a new prescription. If they do not fill the rest of your prescription within those 72 hours, you will need a separate prescription to fill the remaining  amount, which we will NOT provide. If the reason for the partial fill is your insurance, you will need to talk to the pharmacist about payment alternatives for the remaining tablets, but again, DO NOT ACCEPT A PARTIAL FILL, unless you can trust your pharmacist to obtain the remainder of the pills within 72 hours.  Prescription refills and/or changes in medication(s):  Prescription refills, and/or changes in dose or medication, will be conducted only during scheduled medication management appointments. (Applies to both, written and electronic prescriptions.) No refills on procedure days. No medication will be changed or started on procedure days. No changes, adjustments, and/or refills will be conducted on a procedure day. Doing so will interfere with the diagnostic portion of the procedure. No phone refills. No medications will be "called into the pharmacy". No Fax refills. No weekend refills. No Holliday refills. No after hours refills.  Remember:  Business hours are:  Monday to Thursday 8:00 AM to 4:00 PM Provider's Schedule: Delano Metz, MD - Appointments are:  Medication management: Monday and Wednesday 8:00 AM to 4:00 PM Procedure day: Tuesday and Thursday 7:30 AM to 4:00 PM Edward Jolly, MD - Appointments are:  Medication management: Tuesday and Thursday 8:00 AM to 4:00 PM Procedure day: Monday and Wednesday 7:30 AM to 4:00 PM (Last update: 11/25/2021) ______________________________________________________________________   ____________________________________________________________________________________________  Naloxone Nasal Spray  Why am I receiving this medication? [place] STOP ACT requires that all patients taking high dose opioids or at risk of opioids respiratory depression, be prescribed an opioid reversal agent, such as Naloxone (AKA: Narcan).  What is this medication? NALOXONE (  nal OX one) treats opioid overdose, which causes slow or shallow breathing,  severe drowsiness, or trouble staying awake. Call emergency services after using this medication. You may need additional treatment. Naloxone works by reversing the effects of opioids. It belongs to a group of medications called opioid blockers.  COMMON BRAND NAME(S): Kloxxado, Narcan  What should I tell my care team before I take this medication? They need to know if you have any of these conditions: Heart disease Substance use disorder An unusual or allergic reaction to naloxone, other medications, foods, dyes, or preservatives Pregnant or trying to get pregnant Breast-feeding  When to use this medication? This medication is to be used for the treatment of respiratory depression (less than 8 breaths per minute) secondary to opioid overdose.   How to use this medication? This medication is for use in the nose. Lay the person on their back. Support their neck with your hand and allow the head to tilt back before giving the medication. The nasal spray should be given into 1 nostril. After giving the medication, move the person onto their side. Do not remove or test the nasal spray until ready to use. Get emergency medical help right away after giving the first dose of this medication, even if the person wakes up. You should be familiar with how to recognize the signs and symptoms of a narcotic overdose. If more doses are needed, give the additional dose in the other nostril. Talk to your care team about the use of this medication in children. While this medication may be prescribed for children as young as newborns for selected conditions, precautions do apply.  Naloxone Overdosage: If you think you have taken too much of this medicine contact a poison control center or emergency room at once.  NOTE: This medicine is only for you. Do not share this medicine with others.  What if I miss a dose? This does not apply.  What may interact with this medication? This is only used during an  emergency. No interactions are expected during emergency use. This list may not describe all possible interactions. Give your health care provider a list of all the medicines, herbs, non-prescription drugs, or dietary supplements you use. Also tell them if you smoke, drink alcohol, or use illegal drugs. Some items may interact with your medicine.  What should I watch for while using this medication? Keep this medication ready for use in the case of an opioid overdose. Make sure that you have the phone number of your care team and local hospital ready. You may need to have additional doses of this medication. Each nasal spray contains a single dose. Some emergencies may require additional doses. After use, bring the treated person to the nearest hospital or call 911. Make sure the treating care team knows that the person has received a dose of this medication. You will receive additional instructions on what to do during and after use of this medication before an emergency occurs.  What side effects may I notice from receiving this medication? Side effects that you should report to your care team as soon as possible: Allergic reactions--skin rash, itching, hives, swelling of the face, lips, tongue, or throat Side effects that usually do not require medical attention (report these to your care team if they continue or are bothersome): Constipation Dryness or irritation inside the nose Headache Increase in blood pressure Muscle spasms Stuffy nose Toothache This list may not describe all possible side effects. Call your doctor for  medical advice about side effects. You may report side effects to FDA at 1-800-FDA-1088.  Where should I keep my medication? Because this is an emergency medication, you should keep it with you at all times.  Keep out of the reach of children and pets. Store between 20 and 25 degrees C (68 and 77 degrees F). Do not freeze. Throw away any unused medication after the  expiration date. Keep in original box until ready to use.  NOTE: This sheet is a summary. It may not cover all possible information. If you have questions about this medicine, talk to your doctor, pharmacist, or health care provider.   2023 Elsevier/Gold Standard (2020-09-27 00:00:00)  ____________________________________________________________________________________________

## 2022-10-07 ENCOUNTER — Ambulatory Visit: Payer: 59 | Attending: Pain Medicine | Admitting: Pain Medicine

## 2022-10-07 ENCOUNTER — Encounter: Payer: Self-pay | Admitting: Pain Medicine

## 2022-10-07 VITALS — BP 118/79 | HR 100 | Temp 97.3°F | Ht 66.0 in | Wt 165.0 lb

## 2022-10-07 DIAGNOSIS — M25561 Pain in right knee: Secondary | ICD-10-CM | POA: Diagnosis present

## 2022-10-07 DIAGNOSIS — G8929 Other chronic pain: Secondary | ICD-10-CM | POA: Diagnosis present

## 2022-10-07 DIAGNOSIS — M542 Cervicalgia: Secondary | ICD-10-CM | POA: Insufficient documentation

## 2022-10-07 DIAGNOSIS — G894 Chronic pain syndrome: Secondary | ICD-10-CM | POA: Insufficient documentation

## 2022-10-07 DIAGNOSIS — Z79899 Other long term (current) drug therapy: Secondary | ICD-10-CM | POA: Insufficient documentation

## 2022-10-07 DIAGNOSIS — M25512 Pain in left shoulder: Secondary | ICD-10-CM | POA: Diagnosis present

## 2022-10-07 DIAGNOSIS — M961 Postlaminectomy syndrome, not elsewhere classified: Secondary | ICD-10-CM | POA: Diagnosis present

## 2022-10-07 DIAGNOSIS — M79605 Pain in left leg: Secondary | ICD-10-CM | POA: Diagnosis not present

## 2022-10-07 DIAGNOSIS — M7918 Myalgia, other site: Secondary | ICD-10-CM | POA: Diagnosis present

## 2022-10-07 DIAGNOSIS — M79604 Pain in right leg: Secondary | ICD-10-CM | POA: Insufficient documentation

## 2022-10-07 DIAGNOSIS — M19011 Primary osteoarthritis, right shoulder: Secondary | ICD-10-CM | POA: Insufficient documentation

## 2022-10-07 DIAGNOSIS — M545 Low back pain, unspecified: Secondary | ICD-10-CM | POA: Insufficient documentation

## 2022-10-07 DIAGNOSIS — M25562 Pain in left knee: Secondary | ICD-10-CM | POA: Insufficient documentation

## 2022-10-07 DIAGNOSIS — Z79891 Long term (current) use of opiate analgesic: Secondary | ICD-10-CM | POA: Insufficient documentation

## 2022-10-07 DIAGNOSIS — M25511 Pain in right shoulder: Secondary | ICD-10-CM | POA: Diagnosis present

## 2022-10-07 MED ORDER — OXYCODONE HCL 10 MG PO TABS
10.0000 mg | ORAL_TABLET | Freq: Four times a day (QID) | ORAL | 0 refills | Status: DC | PRN
Start: 2022-10-15 — End: 2023-01-06

## 2022-10-07 MED ORDER — OXYCODONE HCL 10 MG PO TABS: 10.0000 mg | ORAL_TABLET | Freq: Four times a day (QID) | ORAL | 0 refills | Status: DC | PRN

## 2022-10-07 MED ORDER — NALOXONE HCL 4 MG/0.1ML NA LIQD
1.0000 | NASAL | 0 refills | Status: DC | PRN
Start: 2022-10-07 — End: 2023-10-05

## 2022-10-07 NOTE — Progress Notes (Signed)
Safety precautions to be maintained throughout the outpatient stay will include: orient to surroundings, keep bed in low position, maintain call bell within reach at all times, provide assistance with transfer out of bed and ambulation.   Nursing Pain Medication Assessment:  Safety precautions to be maintained throughout the outpatient stay will include: orient to surroundings, keep bed in low position, maintain call bell within reach at all times, provide assistance with transfer out of bed and ambulation.  Medication Inspection Compliance: Pill count conducted under aseptic conditions, in front of the patient. Neither the pills nor the bottle was removed from the patient's sight at any time. Once count was completed pills were immediately returned to the patient in their original bottle.  Medication: Oxycodone IR  Pill/Patch Count:  35 of 120 pills remain Pill/Patch Appearance: Markings consistent with prescribed medication Bottle Appearance: Standard pharmacy container. Clearly labeled. Filled Date: 8 / 99 / 2024 Last Medication intake:  Today

## 2023-01-06 ENCOUNTER — Ambulatory Visit: Payer: 59 | Attending: Pain Medicine | Admitting: Pain Medicine

## 2023-01-06 ENCOUNTER — Encounter: Payer: Self-pay | Admitting: Pain Medicine

## 2023-01-06 DIAGNOSIS — M79604 Pain in right leg: Secondary | ICD-10-CM | POA: Diagnosis not present

## 2023-01-06 DIAGNOSIS — M25511 Pain in right shoulder: Secondary | ICD-10-CM | POA: Insufficient documentation

## 2023-01-06 DIAGNOSIS — M19011 Primary osteoarthritis, right shoulder: Secondary | ICD-10-CM | POA: Diagnosis present

## 2023-01-06 DIAGNOSIS — M25561 Pain in right knee: Secondary | ICD-10-CM | POA: Diagnosis present

## 2023-01-06 DIAGNOSIS — M7918 Myalgia, other site: Secondary | ICD-10-CM | POA: Diagnosis present

## 2023-01-06 DIAGNOSIS — G8929 Other chronic pain: Secondary | ICD-10-CM | POA: Diagnosis present

## 2023-01-06 DIAGNOSIS — M545 Low back pain, unspecified: Secondary | ICD-10-CM | POA: Diagnosis not present

## 2023-01-06 DIAGNOSIS — Z79899 Other long term (current) drug therapy: Secondary | ICD-10-CM | POA: Insufficient documentation

## 2023-01-06 DIAGNOSIS — M79605 Pain in left leg: Secondary | ICD-10-CM | POA: Diagnosis not present

## 2023-01-06 DIAGNOSIS — Z79891 Long term (current) use of opiate analgesic: Secondary | ICD-10-CM | POA: Diagnosis present

## 2023-01-06 DIAGNOSIS — M961 Postlaminectomy syndrome, not elsewhere classified: Secondary | ICD-10-CM | POA: Diagnosis present

## 2023-01-06 DIAGNOSIS — M25512 Pain in left shoulder: Secondary | ICD-10-CM | POA: Diagnosis present

## 2023-01-06 DIAGNOSIS — G894 Chronic pain syndrome: Secondary | ICD-10-CM | POA: Diagnosis present

## 2023-01-06 DIAGNOSIS — M542 Cervicalgia: Secondary | ICD-10-CM | POA: Insufficient documentation

## 2023-01-06 DIAGNOSIS — M25562 Pain in left knee: Secondary | ICD-10-CM | POA: Insufficient documentation

## 2023-01-06 MED ORDER — OXYCODONE HCL 10 MG PO TABS
10.0000 mg | ORAL_TABLET | Freq: Four times a day (QID) | ORAL | 0 refills | Status: DC | PRN
Start: 2023-02-12 — End: 2023-04-11

## 2023-01-06 MED ORDER — OXYCODONE HCL 10 MG PO TABS: 10.0000 mg | ORAL_TABLET | Freq: Four times a day (QID) | ORAL | 0 refills | Status: DC | PRN

## 2023-01-06 NOTE — Progress Notes (Signed)
PROVIDER NOTE: Information contained herein reflects review and annotations entered in association with encounter. Interpretation of such information and data should be left to medically-trained personnel. Information provided to patient can be located elsewhere in the medical record under "Patient Instructions". Document created using STT-dictation technology, any transcriptional errors that may result from process are unintentional.    Patient: Holly Spears  Service Category: E/M  Provider: Oswaldo Done, MD  DOB: 05/25/1970  DOS: 01/06/2023  Referring Provider: Rhea Belton  MRN: [id number]  Specialty: Interventional Pain Management  PCP: Perrin Maltese, PA-C  Type: Established Patient  Setting: Ambulatory outpatient    Location: Office  Delivery: Face-to-face     HPI  Holly Spears, a 52 y.o. year old female, is here today because of her No primary diagnosis found.. Holly Spears's primary complain today is Back Pain (mid)  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; Lumbar spondylosis (L3-4, L4-5, and L5-S1); Lumbar bulging disc (L3-4, L4-5, and L5-S1); Chronic lumbar radicular pain (Bilateral) (R>L); Chronic knee pain (3ry area of Pain) (Bilateral) (R>L); Chronic upper back pain (Left); Thoracic disc herniation (Large, Left paracentral T10-11 disc herniation); Thoracic spinal stenosis (Severe, Left, T10-11 Lateral Recess  Stenosis); Thoracic foraminal stenosis (Severe Left T10-11); History of bilateral carpal tunnel release (Bilateral); Chronic hand pain (Bilateral) (R>L); Muscle weakness; Chronic lower extremity pain (2ry area of Pain) (Bilateral); Neck pain; Spasm of back muscles; Myalgia and myositis; Fibromyalgia; Sjogren's syndrome (HCC); DDD (degenerative disc disease), cervical; Right cervical radiculopathy (C8); Abnormal MRI, cervical spine (10/16/2021); Enthesopathy of shoulder (Right); Trigger point of shoulder region (Right); Pain in shoulder region (Right); and Arthropathy of shoulder (Right) on their pertinent problem list. Pain Assessment: Severity of Chronic pain is reported as a 2 /10. Location: Back Mid/radiat4es down right leg in the back to heel. Onset: More than a month ago. Quality: Aching. Timing: Constant. Modifying factor(s): meds, heat. Vitals:  height is 5\' 6"  (1.676 m) and weight is 185 lb (83.9 kg). Her temperature is 97.3 F (36.3 C) (abnormal). Her blood pressure is 138/90 (abnormal) and her pulse is 100. Her respiration is 18 and oxygen saturation is 98%.  BMI: Estimated body mass index is 29.86 kg/m as calculated from the following:   Height as of this encounter: 5\' 6"  (1.676 m).   Weight as of this encounter: 185 lb (83.9 kg). Last encounter: 10/07/2022. Last procedure: Visit date not found.  Reason for encounter: medication management.  The patient indicates doing well with the current medication regimen. No adverse reactions or side effects reported to the medications.  Discussed the use of AI scribe software for clinical note transcription with the patient, who gave verbal consent to proceed.  History of Present Illness   The patient, with a history of chronic pain, reports no adverse reactions to their current pain medications. They express satisfaction with the relief obtained from a previous procedure involving nerve ablation, with no recurrence of pain in the treated area.  The  patient's primary complaint is persistent pain located between the T10 and  T12 vertebrae in the mid to upper back region. This pain is managed effectively with their current medication regimen.  In addition to the back pain, the patient experiences constant pain, numbness, and weakness in the right leg, extending from the back down to the heel. Despite these symptoms, the patient reports no significant difficulty with ambulation and maintains their independence in daily activities. The current medications provide partial relief for these symptoms. The patient has not experienced any falls and continues to be mobile as desired.     RTCB: 04/13/2023   Pharmacotherapy Assessment  Analgesic: Oxycodone IR 10 mg, 1 tab PO q 6 hrs (40 mg/day of oxycodone) MME/day: 60 mg/day.   Monitoring: [place] PMP: PDMP reviewed during this encounter.       Pharmacotherapy: No side-effects or adverse reactions reported. Compliance: No problems identified. Effectiveness: Clinically acceptable.  Valerie Salts, RN  01/06/2023 10:32 AM  Sign when Signing Visit Nursing Pain Medication Assessment:  Safety precautions to be maintained throughout the outpatient stay will include: orient to surroundings, keep bed in low position, maintain call bell within reach at all times, provide assistance with transfer out of bed and ambulation.  Medication Inspection Compliance: Pill count conducted under aseptic conditions, in front of the patient. Neither the pills nor the bottle was removed from the patient's sight at any time. Once count was completed pills were immediately returned to the patient in their original bottle.  Medication: Oxycodone IR Pill/Patch Count:  30 of 120 pills remain Pill/Patch Appearance: Markings consistent with prescribed medication Bottle Appearance: Standard pharmacy container. Clearly labeled. Filled Date: 15 / 11 / 2024 Last Medication intake:  Today    No results found for: "CBDTHCR" No results found  for: "D8THCCBX" No results found for: "D9THCCBX"  UDS:  Summary  Date Value Ref Range Status  07/13/2022 Note  Final    Comment:    ==================================================================== [employer] 13 (MW) ==================================================================== Test                             Result       Flag       Units  Drug Present and Declared for Prescription Verification   Oxycodone                      1491         EXPECTED   ng/mg creat   Oxymorphone                    683          EXPECTED   ng/mg creat   Noroxycodone                   4491         EXPECTED   ng/mg creat   Noroxymorphone                 322          EXPECTED   ng/mg creat    Sources of oxycodone are scheduled prescription medications.    Oxymorphone, noroxycodone, and noroxymorphone are expected    metabolites of oxycodone. Oxymorphone is also available as a    scheduled prescription medication.  ==================================================================== Test                      Result    Flag   Units  Ref Range   Creatinine              23               mg/dL      >=08 ==================================================================== Declared Medications:  The flagging and interpretation on this report are based on the  following declared medications.  Unexpected results may arise from  inaccuracies in the declared medications.   **Note: The testing scope of this panel includes these medications:   Oxycodone   **Note: The testing scope of this panel does not include the  following reported medications:   Biotin  Bupropion (Wellbutrin)  Calcium  Citalopram (Celexa)  Cyclobenzaprine (Flexeril)  Epinephrine (EpiPen)  Famotidine (Pepcid)  Fluoride (Prevident)  Ibuprofen (Advil)  Levothyroxine (Synthroid)  Magnesium  Multivitamin  Naloxone (Narcan)  Nifedipine (Procardia)  Potassium (Klor-Con)  Vitamin D  Vitamin D3   Zinc ==================================================================== For clinical consultation, please call [phone]. ====================================================================       ROS  Constitutional: Denies any fever or chills Gastrointestinal: No reported hemesis, hematochezia, vomiting, or acute GI distress Musculoskeletal: Denies any acute onset joint swelling, redness, loss of ROM, or weakness Neurological: No reported episodes of acute onset apraxia, aphasia, dysarthria, agnosia, amnesia, paralysis, loss of coordination, or loss of consciousness  Medication Review  Biotin, Calcium Carbonate-Vitamin D, EPINEPHrine, Magnesium, Multi-Vitamins, NIFEdipine, Oxycodone HCl, Sodium Fluoride, Vitamin D3, Zinc, buPROPion, citalopram, cyclobenzaprine, famotidine, hydroxychloroquine, ibuprofen, levothyroxine, naloxone, and potassium chloride SA  History Review  Allergy: Holly Spears is allergic to bee venom, penicillins, shellfish allergy, sulfa antibiotics, erythromycin, zaleplon, codeine, and trazodone. Drug: Holly Spears  reports no history of drug use. Alcohol:  reports no history of alcohol use. Tobacco:  reports that she has quit smoking. She has never used smokeless tobacco. Social: Holly Spears  reports that she has quit smoking. She has never used smokeless tobacco. She reports that she does not drink alcohol and does not use drugs. Medical:  has a past medical history of Acid reflux (12/15/2011), Anxiety, generalized (12/15/2011), Arthritis, Cervical pain (12/15/2011), Depression, GERD (gastroesophageal reflux disease), Hiatal hernia, LBP (low back pain) (12/15/2011), Migraine, Muscle ache (12/15/2011), Osteopenia of left hip, Pain (12/27/2012), Rheumatoid arthritis (HCC) (12/15/2011), and Scratched cornea (10/25/2015). Surgical: Holly Spears  has a past surgical history that includes Cholecystectomy and Abdominal hysterectomy. Family: family history includes Depression  in her mother; Diabetes in her father; Hyperlipidemia in her mother; Hypertension in her father and mother.  Laboratory Chemistry Profile   Renal Lab Results  Component Value Date   BUN 6 03/03/2018   CREATININE 0.63 03/03/2018   BCR 10 03/03/2018   GFRAA 123 03/03/2018   GFRNONAA 106 03/03/2018    Hepatic Lab Results  Component Value Date   AST 21 03/03/2018   ALT 26 03/27/2015   ALBUMIN 4.6 03/03/2018   ALKPHOS 71 03/03/2018    Electrolytes Lab Results  Component Value Date   NA 136 03/03/2018   K 3.9 03/03/2018   CL 97 03/03/2018   CALCIUM 8.9 03/03/2018   MG 2.1 03/03/2018    Bone Lab Results  Component Value Date   VD25OH 29.3 (L) 12/31/2014    Inflammation (CRP: Acute Phase) (ESR: Chronic Phase) Lab Results  Component Value Date   CRP <1 03/03/2018   ESRSEDRATE 2 03/03/2018         Note: Above Lab results reviewed.  Recent Imaging Review  DG PAIN CLINIC C-ARM 1-60 MIN NO REPORT Fluoro was used, but no Radiologist interpretation will be  provided.  Please refer to "NOTES" tab for provider progress note. Note: Reviewed        Physical Exam  General appearance: Well nourished, well developed, and well hydrated. In no apparent acute distress Mental status: Alert, oriented x 3 (person, place, & time)       Respiratory: No evidence of acute respiratory distress Eyes: PERLA Vitals: BP (!) 138/90   Pulse 100   Temp (!) 97.3 F (36.3 C)   Resp 18   Ht 5\' 6"  (1.676 m)   Wt 185 lb (83.9 kg)   SpO2 98%   BMI 29.86 kg/m  BMI: Estimated body mass index is 29.86 kg/m as calculated from the following:   Height as of this encounter: 5\' 6"  (1.676 m).   Weight as of this encounter: 185 lb (83.9 kg). Ideal: Ideal body weight: 59.3 kg (130 lb 11.7 oz) Adjusted ideal body weight: 69.1 kg (152 lb 7 oz)  Assessment   Diagnosis Status  1. Chronic low back pain (1ry area of Pain) (Bilateral) (R>L) w/o sciatica   2. Chronic lower extremity pain (2ry area of Pain)  (Bilateral)   3. Chronic knee pain (3ry area of Pain) (Bilateral) (R>L)   4. Chronic neck pain (4th area of Pain)   5. Chronic shoulder pain (5th area of Pain) (Bilateral) (R>L)   6. Failed back surgical syndrome   7. Chronic musculoskeletal pain   8. Arthropathy of shoulder (Right)   9. Chronic pain syndrome   10. Pharmacologic therapy   11. Chronic use of opiate for therapeutic purpose   12. Encounter for medication management   13. Encounter for chronic pain management    Controlled Controlled Controlled   Updated Problems: No problems updated.  Plan of Care  Problem-specific:  Assessment and Plan    Chronic Back Pain Chronic back pain localized between T10 and T12 shows significant relief from previous nerve ablation and ongoing medication, with pain well-managed and no adverse reactions reported. We will send refills for pain medications for three months, continue the current pain management regimen, and they should report any flare-ups or new symptoms.  Radiculopathy Chronic radiculopathy in the right leg presents with pain and numbness extending to the heel, yet symptoms remain managed with current medications without significantly impacting mobility or daily activities. We will maintain the current pain management regimen and they should report any changes in symptoms or new difficulties in walking.  Follow-up We will schedule a follow-up appointment in three months.       Holly Spears has a current medication list which includes the following long-term medication(s): calcium carbonate-vitamin d, citalopram, famotidine, levothyroxine, naloxone, nifedipine, [START ON 01/13/2023] oxycodone hcl, [START ON 02/12/2023] oxycodone hcl, [START ON 03/14/2023] oxycodone hcl, bupropion, and cyclobenzaprine.  Pharmacotherapy (Medications Ordered): Meds ordered this encounter  Medications   Oxycodone HCl 10 MG TABS    Sig: Take 1 tablet (10 mg total) by mouth every 6 (six) hours  as needed. Must last 30 days    Dispense:  120 tablet    Refill:  0    DO NOT: delete (not duplicate); no partial-fill (will deny script to complete), no refill request (F/U required). DISPENSE: 1 day early if closed on fill date. WARN: No CNS-depressants within 8 hrs of med.   Oxycodone HCl 10 MG TABS    Sig: Take 1 tablet (10 mg total) by mouth every 6 (six) hours as needed. Must last 30 days    Dispense:  120 tablet  Refill:  0    DO NOT: delete (not duplicate); no partial-fill (will deny script to complete), no refill request (F/U required). DISPENSE: 1 day early if closed on fill date. WARN: No CNS-depressants within 8 hrs of med.   Oxycodone HCl 10 MG TABS    Sig: Take 1 tablet (10 mg total) by mouth every 6 (six) hours as needed. Must last 30 days    Dispense:  120 tablet    Refill:  0    DO NOT: delete (not duplicate); no partial-fill (will deny script to complete), no refill request (F/U required). DISPENSE: 1 day early if closed on fill date. WARN: No CNS-depressants within 8 hrs of med.   Orders:  No orders of the defined types were placed in this encounter.  Follow-up plan:   Return in about 3 months (around 04/13/2023) for Eval-day (M,W), (F2F), (MM).      Interventional Therapies  Risk Factors  Complex Considerations:   The patient had a bad experience with another physician receiving blocks.   Planned  Pending:      Under consideration:   Diagnostic bilateral lumbar facet block  Possible bilateral lumbar facet RFA  Diagnostic right L5-S1 LESI  Possible right L5 vs S1 TFESI  Diagnostic bilateral IA knee injections (w/ steroid)  Possible series of 5 IA Hyalgan knee injections  Diagnostic bilateral Genicular NB  Possible bilateral Genicular nerve RFA    Completed:   Diagnostic right suprascapular NB x1 (11/25/2021) (100/80/80/80)  Therapeutic right suprascapular nerve RFA #1 (01/01/2022) (100/100/75/pain flareup/75)    Therapeutic  Palliative (PRN) options:    None.   Pharmacotherapy  Nonopioids transferred 11/22/2019: Flexeril       Recent Visits No visits were found meeting these conditions. Showing recent visits within past 90 days and meeting all other requirements Today's Visits Date Type Provider Dept  01/06/23 Office Visit Delano Metz, MD [facility]  Showing today's visits and meeting all other requirements Future Appointments No visits were found meeting these conditions. Showing future appointments within next 90 days and meeting all other requirements  I discussed the assessment and treatment plan with the patient. The patient was provided an opportunity to ask questions and all were answered. The patient agreed with the plan and demonstrated an understanding of the instructions.  Patient advised to call back or seek an in-person evaluation if the symptoms or condition worsens.  Duration of encounter: 30 minutes.  Total time on encounter, as per AMA guidelines included both the face-to-face and non-face-to-face time personally spent by the physician and/or other qualified health care professional(s) on the day of the encounter (includes time in activities that require the physician or other qualified health care professional and does not include time in activities normally performed by clinical staff). Physician's time may include the following activities when performed: Preparing to see the patient (e.g., pre-charting review of records, searching for previously ordered imaging, lab work, and nerve conduction tests) Review of prior analgesic pharmacotherapies. Reviewing PMP Interpreting ordered tests (e.g., lab work, imaging, nerve conduction tests) Performing post-procedure evaluations, including interpretation of diagnostic procedures Obtaining and/or reviewing separately obtained history Performing a medically appropriate examination and/or evaluation Counseling and educating the  patient/family/caregiver Ordering medications, tests, or procedures Referring and communicating with other health care professionals (when not separately reported) Documenting clinical information in the electronic or other health record Independently interpreting results (not separately reported) and communicating results to the patient/ family/caregiver Care coordination (not separately reported)  Note by: [place]  Erlinda Hong, MD Date: 01/06/2023; Time: 12:36 PM

## 2023-01-06 NOTE — Patient Instructions (Signed)

## 2023-01-06 NOTE — Progress Notes (Signed)
Nursing Pain Medication Assessment:  Safety precautions to be maintained throughout the outpatient stay will include: orient to surroundings, keep bed in low position, maintain call bell within reach at all times, provide assistance with transfer out of bed and ambulation.  Medication Inspection Compliance: Pill count conducted under aseptic conditions, in front of the patient. Neither the pills nor the bottle was removed from the patient's sight at any time. Once count was completed pills were immediately returned to the patient in their original bottle.  Medication: Oxycodone IR Pill/Patch Count:  30 of 120 pills remain Pill/Patch Appearance: Markings consistent with prescribed medication Bottle Appearance: Standard pharmacy container. Clearly labeled. Filled Date: 46 / 11 / 2024 Last Medication intake:  Today

## 2023-04-11 NOTE — Patient Instructions (Signed)

## 2023-04-11 NOTE — Progress Notes (Unsigned)
 PROVIDER NOTE: Information contained herein reflects review and annotations entered in association with encounter. Interpretation of such information and data should be left to medically-trained personnel. Information provided to patient can be located elsewhere in the medical record under "Patient Instructions". Document created using STT-dictation technology, any transcriptional errors that may result from process are unintentional.    Patient: Holly Spears  Service Category: E/M  Provider: Oswaldo Done, MD  DOB: December 23, 1970  DOS: 04/12/2023  Referring Provider: Rhea Belton  MRN: [id number]  Specialty: Interventional Pain Management  PCP: Perrin Maltese, PA-C  Type: Established Patient  Setting: Ambulatory outpatient    Location: Office  Delivery: Face-to-face     HPI  [name], a 53 y.o. year old female, is here today because of her No primary diagnosis found.. [name]'s primary complain today is No chief complaint on file.  Pertinent problems: 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; Lumbar spondylosis (L3-4, L4-5, and L5-S1); Lumbar bulging disc (L3-4, L4-5, and L5-S1); Chronic lumbar radicular pain (Bilateral) (R>L); Chronic knee pain (3ry area of Pain) (Bilateral) (R>L); Chronic upper back pain (Left); Thoracic disc herniation (Large, Left paracentral T10-11 disc herniation); Thoracic spinal stenosis (Severe, Left, T10-11 Lateral  Recess Stenosis); Thoracic foraminal stenosis (Severe Left T10-11); History of bilateral carpal tunnel release (Bilateral); Chronic hand pain (Bilateral) (R>L); Muscle weakness; Chronic lower extremity pain (2ry area of Pain) (Bilateral); Neck pain; Spasm of back muscles; Myalgia and myositis; Fibromyalgia; Sjogren's syndrome (HCC); DDD (degenerative disc disease), cervical; Right cervical radiculopathy (C8); Abnormal MRI, cervical spine (10/16/2021); Enthesopathy of shoulder (Right); Trigger point of shoulder region (Right); Pain in shoulder region (Right); and Arthropathy of shoulder (Right) on their pertinent problem list. Pain Assessment: Severity of   is reported as a  /10. Location:    / . Onset:  . Quality:  . Timing:  . Modifying factor(s):  Marland Kitchen Vitals:  vitals were not taken for this visit.  BMI: Estimated body mass index is 29.86 kg/m as calculated from the following:   Height as of 01/06/23: 5\' 6"  (1.676 m).   Weight as of 01/06/23: 185 lb (83.9 kg). Last encounter: 01/06/2023. Last procedure: Visit date not found.  Reason for encounter: medication management. ***  Discussed the use of AI scribe software for clinical note transcription with the patient, who gave verbal consent to proceed.  History of Present Illness         RTCB: 07/12/2023   Pharmacotherapy Assessment  Analgesic: Oxycodone IR 10 mg, 1 tab PO q 6 hrs (40 mg/day of oxycodone) MME/day: 60 mg/day.   Monitoring: [place] PMP: PDMP reviewed during this encounter.       Pharmacotherapy: No side-effects or adverse reactions reported. Compliance: No problems identified. Effectiveness: Clinically acceptable.  No notes on file  No results found for: "CBDTHCR" No results found for: "D8THCCBX" No results found for: "D9THCCBX"  UDS:  Summary  Date Value Ref Range Status  07/13/2022 Note  Final    Comment:    ==================================================================== [employer] [facility] [employer]  (MW) ==================================================================== Test  Result       Flag       Units  Drug Present and Declared for Prescription Verification   Oxycodone                      1491         EXPECTED   ng/mg creat   Oxymorphone                    683          EXPECTED   ng/mg creat   Noroxycodone                   4491         EXPECTED   ng/mg creat   Noroxymorphone                 322          EXPECTED   ng/mg creat    Sources of oxycodone are scheduled prescription medications.    Oxymorphone, noroxycodone, and noroxymorphone are expected    metabolites of oxycodone. Oxymorphone is also available as a    scheduled prescription medication.  ==================================================================== Test                      Result    Flag   Units      Ref Range   Creatinine              23               mg/dL      >=78 ==================================================================== Declared Medications:  The flagging and interpretation on this report are based on the  following declared medications.  Unexpected results may arise from  inaccuracies in the declared medications.   **Note: The testing scope of this panel includes these medications:   Oxycodone   **Note: The testing scope of this panel does not include the  following reported medications:   Biotin  Bupropion (Wellbutrin)  Calcium  Citalopram (Celexa)  Cyclobenzaprine (Flexeril)  Epinephrine (EpiPen)  Famotidine (Pepcid)  Fluoride (Prevident)  Ibuprofen (Advil)  Levothyroxine (Synthroid)  Magnesium  Multivitamin  Naloxone (Narcan)  Nifedipine (Procardia)  Potassium (Klor-Con)  Vitamin D  Vitamin D3  Zinc ==================================================================== For clinical consultation, please call [phone]. ====================================================================       ROS  Constitutional: Denies any fever  or chills Gastrointestinal: No reported hemesis, hematochezia, vomiting, or acute GI distress Musculoskeletal: Denies any acute onset joint swelling, redness, loss of ROM, or weakness Neurological: No reported episodes of acute onset apraxia, aphasia, dysarthria, agnosia, amnesia, paralysis, loss of coordination, or loss of consciousness  Medication Review  Biotin, Calcium Carbonate-Vitamin D, EPINEPHrine, Magnesium, Multi-Vitamins, NIFEdipine, Oxycodone HCl, Sodium Fluoride, Vitamin D3, Zinc, buPROPion, citalopram, cyclobenzaprine, famotidine, hydroxychloroquine, ibuprofen, levothyroxine, naloxone, and potassium chloride SA  History Review  Allergy: [name] is allergic to bee venom, penicillins, shellfish allergy, sulfa antibiotics, erythromycin, zaleplon, codeine, and trazodone. Drug: [name]  reports no history of drug use. Alcohol:  reports no history of alcohol use. Tobacco:  reports that she has quit smoking. She has never used smokeless tobacco. Social: [name]  reports that she has quit smoking. She has never used smokeless tobacco. She reports that she does not drink alcohol and does not use drugs. Medical:  has a past medical history of Acid reflux (12/15/2011), Anxiety, generalized (12/15/2011), Arthritis, Cervical pain (12/15/2011), Depression, GERD (gastroesophageal reflux disease), Hiatal hernia,  LBP (low back pain) (12/15/2011), Migraine, Muscle ache (12/15/2011), Osteopenia of left hip, Pain (12/27/2012), Rheumatoid arthritis (HCC) (12/15/2011), and Scratched cornea (10/25/2015). Surgical: [name]  has a past surgical history that includes Cholecystectomy and Abdominal hysterectomy. Family: family history includes Depression in her mother; Diabetes in her father; Hyperlipidemia in her mother; Hypertension in her father and mother.  Laboratory Chemistry Profile   Renal Lab Results  Component Value Date   BUN 6 03/03/2018   CREATININE 0.63 03/03/2018   BCR 10  03/03/2018   GFRAA 123 03/03/2018   GFRNONAA 106 03/03/2018    Hepatic Lab Results  Component Value Date   AST 21 03/03/2018   ALT 26 03/27/2015   ALBUMIN 4.6 03/03/2018   ALKPHOS 71 03/03/2018    Electrolytes Lab Results  Component Value Date   NA 136 03/03/2018   K 3.9 03/03/2018   CL 97 03/03/2018   CALCIUM 8.9 03/03/2018   MG 2.1 03/03/2018    Bone Lab Results  Component Value Date   VD25OH 29.3 (L) 12/31/2014    Inflammation (CRP: Acute Phase) (ESR: Chronic Phase) Lab Results  Component Value Date   CRP <1 03/03/2018   ESRSEDRATE 2 03/03/2018         Note: Above Lab results reviewed.  Recent Imaging Review  DG PAIN CLINIC C-ARM 1-60 MIN NO REPORT Fluoro was used, but no Radiologist interpretation will be provided.  Please refer to "NOTES" tab for provider progress note. Note: Reviewed        Physical Exam  General appearance: Well nourished, well developed, and well hydrated. In no apparent acute distress Mental status: Alert, oriented x 3 (person, place, & time)       Respiratory: No evidence of acute respiratory distress Eyes: PERLA Vitals: There were no vitals taken for this visit. BMI: Estimated body mass index is 29.86 kg/m as calculated from the following:   Height as of 01/06/23: 5\' 6"  (1.676 m).   Weight as of 01/06/23: 185 lb (83.9 kg). Ideal: Patient weight not recorded  Assessment   Diagnosis Status  1. Chronic low back pain (1ry area of Pain) (Bilateral) (R>L) w/o sciatica   2. Chronic lower extremity pain (2ry area of Pain) (Bilateral)   3. Chronic knee pain (3ry area of Pain) (Bilateral) (R>L)   4. Chronic neck pain (4th area of Pain)   5. Chronic shoulder pain (5th area of Pain) (Bilateral) (R>L)   6. Failed back surgical syndrome   7. Chronic musculoskeletal pain   8. Arthropathy of shoulder (Right)   9. Chronic pain syndrome   10. Pharmacologic therapy   11. Chronic use of opiate for therapeutic purpose   12. Encounter for  medication management   13. Encounter for chronic pain management    Controlled Controlled Controlled   Updated Problems: No problems updated.  Plan of Care  Problem-specific:  Assessment and Plan            [name] has a current medication list which includes the following long-term medication(s): bupropion, calcium carbonate-vitamin d, citalopram, cyclobenzaprine, famotidine, levothyroxine, naloxone, nifedipine, and oxycodone hcl.  Pharmacotherapy (Medications Ordered): No orders of the defined types were placed in this encounter.  Orders:  No orders of the defined types were placed in this encounter.  Follow-up plan:   No follow-ups on file.      Interventional Therapies  Risk Factors  Complex Considerations:   The patient had a bad experience with another physician receiving blocks.  Planned  Pending:      Under consideration:   Diagnostic bilateral lumbar facet block  Possible bilateral lumbar facet RFA  Diagnostic right L5-S1 LESI  Possible right L5 vs S1 TFESI  Diagnostic bilateral IA knee injections (w/ steroid)  Possible series of 5 IA Hyalgan knee injections  Diagnostic bilateral Genicular NB  Possible bilateral Genicular nerve RFA    Completed:   Diagnostic right suprascapular NB x1 (11/25/2021) (100/80/80/80)  Therapeutic right suprascapular nerve RFA #1 (01/01/2022) (100/100/75/pain flareup/75)    Therapeutic  Palliative (PRN) options:   None.   Pharmacotherapy  Nonopioids transferred 11/22/2019: Flexeril      Recent Visits No visits were found meeting these conditions. Showing recent visits within past 90 days and meeting all other requirements Future Appointments Date Type Provider Dept  04/12/23 Appointment Delano Metz, MD [facility]  Showing future appointments within next 90 days and meeting all other requirements  I discussed the assessment and treatment plan with the patient. The patient was provided  an opportunity to ask questions and all were answered. The patient agreed with the plan and demonstrated an understanding of the instructions.  Patient advised to call back or seek an in-person evaluation if the symptoms or condition worsens.  Duration of encounter: *** minutes.  Total time on encounter, as per AMA guidelines included both the face-to-face and non-face-to-face time personally spent by the physician and/or other qualified health care professional(s) on the day of the encounter (includes time in activities that require the physician or other qualified health care professional and does not include time in activities normally performed by clinical staff). Physician's time may include the following activities when performed: Preparing to see the patient (e.g., pre-charting review of records, searching for previously ordered imaging, lab work, and nerve conduction tests) Review of prior analgesic pharmacotherapies. Reviewing PMP Interpreting ordered tests (e.g., lab work, imaging, nerve conduction tests) Performing post-procedure evaluations, including interpretation of diagnostic procedures Obtaining and/or reviewing separately obtained history Performing a medically appropriate examination and/or evaluation Counseling and educating the patient/family/caregiver Ordering medications, tests, or procedures Referring and communicating with other health care professionals (when not separately reported) Documenting clinical information in the electronic or other health record Independently interpreting results (not separately reported) and communicating results to the patient/ family/caregiver Care coordination (not separately reported)  Note by: Oswaldo Done, MD Date: 04/12/2023; Time: 2:30 PM

## 2023-04-12 ENCOUNTER — Ambulatory Visit: Payer: 59 | Attending: Pain Medicine | Admitting: Pain Medicine

## 2023-04-12 DIAGNOSIS — M79605 Pain in left leg: Secondary | ICD-10-CM | POA: Insufficient documentation

## 2023-04-12 DIAGNOSIS — M7918 Myalgia, other site: Secondary | ICD-10-CM | POA: Diagnosis present

## 2023-04-12 DIAGNOSIS — M961 Postlaminectomy syndrome, not elsewhere classified: Secondary | ICD-10-CM | POA: Diagnosis present

## 2023-04-12 DIAGNOSIS — G8929 Other chronic pain: Secondary | ICD-10-CM | POA: Insufficient documentation

## 2023-04-12 DIAGNOSIS — M542 Cervicalgia: Secondary | ICD-10-CM | POA: Insufficient documentation

## 2023-04-12 DIAGNOSIS — Z79899 Other long term (current) drug therapy: Secondary | ICD-10-CM | POA: Diagnosis present

## 2023-04-12 DIAGNOSIS — M25511 Pain in right shoulder: Secondary | ICD-10-CM | POA: Insufficient documentation

## 2023-04-12 DIAGNOSIS — M19011 Primary osteoarthritis, right shoulder: Secondary | ICD-10-CM | POA: Insufficient documentation

## 2023-04-12 DIAGNOSIS — M25512 Pain in left shoulder: Secondary | ICD-10-CM | POA: Insufficient documentation

## 2023-04-12 DIAGNOSIS — Z79891 Long term (current) use of opiate analgesic: Secondary | ICD-10-CM | POA: Diagnosis present

## 2023-04-12 DIAGNOSIS — G894 Chronic pain syndrome: Secondary | ICD-10-CM | POA: Insufficient documentation

## 2023-04-12 DIAGNOSIS — M79604 Pain in right leg: Secondary | ICD-10-CM | POA: Insufficient documentation

## 2023-04-12 DIAGNOSIS — M545 Low back pain, unspecified: Secondary | ICD-10-CM | POA: Insufficient documentation

## 2023-04-12 DIAGNOSIS — M25562 Pain in left knee: Secondary | ICD-10-CM | POA: Insufficient documentation

## 2023-04-12 DIAGNOSIS — M25561 Pain in right knee: Secondary | ICD-10-CM | POA: Insufficient documentation

## 2023-04-12 MED ORDER — OXYCODONE HCL 10 MG PO TABS
10.0000 mg | ORAL_TABLET | Freq: Four times a day (QID) | ORAL | 0 refills | Status: DC | PRN
Start: 2023-06-12 — End: 2023-07-01

## 2023-04-12 MED ORDER — OXYCODONE HCL 10 MG PO TABS
10.0000 mg | ORAL_TABLET | Freq: Four times a day (QID) | ORAL | 0 refills | Status: DC | PRN
Start: 2023-04-13 — End: 2023-07-01

## 2023-04-12 MED ORDER — OXYCODONE HCL 10 MG PO TABS
10.0000 mg | ORAL_TABLET | Freq: Four times a day (QID) | ORAL | 0 refills | Status: DC | PRN
Start: 2023-05-13 — End: 2023-07-01

## 2023-04-12 NOTE — Progress Notes (Signed)
 Nursing Pain Medication Assessment:  Safety precautions to be maintained throughout the outpatient stay will include: orient to surroundings, keep bed in low position, maintain call bell within reach at all times, provide assistance with transfer out of bed and ambulation.  Medication Inspection Compliance: Pill count conducted under aseptic conditions, in front of the patient. Neither the pills nor the bottle was removed from the patient's sight at any time. Once count was completed pills were immediately returned to the patient in their original bottle.  Medication: Oxycodone IR Pill/Patch Count:  4 of 120 pills remain Pill/Patch Appearance: Markings consistent with prescribed medication Bottle Appearance: Standard pharmacy container. Clearly labeled. Filled Date: 2 / 8 / 2025 Last Medication intake:  Today

## 2023-05-23 DIAGNOSIS — M069 Rheumatoid arthritis, unspecified: Secondary | ICD-10-CM | POA: Insufficient documentation

## 2023-05-23 DIAGNOSIS — R7303 Prediabetes: Secondary | ICD-10-CM | POA: Insufficient documentation

## 2023-07-05 ENCOUNTER — Ambulatory Visit: Attending: Nurse Practitioner | Admitting: Nurse Practitioner

## 2023-07-05 ENCOUNTER — Encounter: Payer: Self-pay | Admitting: Nurse Practitioner

## 2023-07-05 ENCOUNTER — Encounter: Admitting: Pain Medicine

## 2023-07-05 DIAGNOSIS — Z79899 Other long term (current) drug therapy: Secondary | ICD-10-CM | POA: Diagnosis present

## 2023-07-05 DIAGNOSIS — M79605 Pain in left leg: Secondary | ICD-10-CM

## 2023-07-05 DIAGNOSIS — M79604 Pain in right leg: Secondary | ICD-10-CM

## 2023-07-05 DIAGNOSIS — M25512 Pain in left shoulder: Secondary | ICD-10-CM

## 2023-07-05 DIAGNOSIS — M7918 Myalgia, other site: Secondary | ICD-10-CM | POA: Diagnosis present

## 2023-07-05 DIAGNOSIS — Z79891 Long term (current) use of opiate analgesic: Secondary | ICD-10-CM

## 2023-07-05 DIAGNOSIS — M542 Cervicalgia: Secondary | ICD-10-CM

## 2023-07-05 DIAGNOSIS — M25561 Pain in right knee: Secondary | ICD-10-CM | POA: Diagnosis not present

## 2023-07-05 DIAGNOSIS — M545 Low back pain, unspecified: Secondary | ICD-10-CM | POA: Diagnosis not present

## 2023-07-05 DIAGNOSIS — M961 Postlaminectomy syndrome, not elsewhere classified: Secondary | ICD-10-CM | POA: Diagnosis present

## 2023-07-05 DIAGNOSIS — M25511 Pain in right shoulder: Secondary | ICD-10-CM | POA: Insufficient documentation

## 2023-07-05 DIAGNOSIS — M19011 Primary osteoarthritis, right shoulder: Secondary | ICD-10-CM | POA: Diagnosis present

## 2023-07-05 DIAGNOSIS — G8929 Other chronic pain: Secondary | ICD-10-CM | POA: Insufficient documentation

## 2023-07-05 DIAGNOSIS — M25562 Pain in left knee: Secondary | ICD-10-CM | POA: Diagnosis present

## 2023-07-05 DIAGNOSIS — G894 Chronic pain syndrome: Secondary | ICD-10-CM

## 2023-07-05 MED ORDER — OXYCODONE HCL 10 MG PO TABS: 10.0000 mg | ORAL_TABLET | Freq: Four times a day (QID) | ORAL | 0 refills | Status: DC | PRN

## 2023-07-05 NOTE — Progress Notes (Signed)
 Nursing Pain Medication Assessment:  Safety precautions to be maintained throughout the outpatient stay will include: orient to surroundings, keep bed in low position, maintain call bell within reach at all times, provide assistance with transfer out of bed and ambulation.  Medication Inspection Compliance: Pill count conducted under aseptic conditions, in front of the patient. Neither the pills nor the bottle was removed from the patient's sight at any time. Once count was completed pills were immediately returned to the patient in their original bottle.  Medication: oxycodone  Pill/Patch Count: 28 of 120 pills/patches remain Pill/Patch Appearance: Markings consistent with prescribed medication Bottle Appearance: Standard pharmacy container. Clearly labeled. Filled Date: 5 / 10 / 2025 Last Medication intake:  Today

## 2023-07-05 NOTE — Progress Notes (Signed)
 PROVIDER NOTE: Interpretation of information contained herein should be left to medically-trained personnel. Specific patient instructions are provided elsewhere under "Patient Instructions" section of medical record. This document was created in part using AI and STT-dictation technology, any transcriptional errors that may result from this process are unintentional.  Patient: Holly Spears  Service: E/M   PCP: Margarette Shawl, PA-C  DOB: March 18, 1970  DOS: 07/05/2023  Provider: Cherylin Corrigan, NP  MRN: [id number]  Delivery: Face-to-face  Specialty: Interventional Pain Management  Type: Established Patient  Setting: Ambulatory outpatient facility  Specialty designation: 09  Referring Prov.: Margarette Shawl, PA-C  Location: Outpatient office facility       History of present illness (HPI) Holly Spears, a 53 y.o. year old female, is here today because of her No primary diagnosis found.. Holly Spears's primary complain today is Back Pain   Pain Assessment: Severity of Chronic pain is reported as a 1 /10. Location: Back Lower/to right heel. Onset: More than a month ago. Quality: Aching, Sharp. Timing: Constant. Modifying factor(s): meds. Vitals:  height is 5\' 7"  (1.702 m) and weight is 178 lb (80.7 kg). Her temperature is 97.3 F (36.3 C) (abnormal). Her blood pressure is 121/69 and her pulse is 100. Her respiration is 16 and oxygen saturation is 100%.  BMI: Estimated body mass index is 27.88 kg/m as calculated from the following:   Height as of this encounter: 5\' 7"  (1.702 m).   Weight as of this encounter: 178 lb (80.7 kg).  Last encounter: 04/12/2023 Last procedure: 01/01/2022  Reason for encounter: medication management. The patient indicates doing well with current medication regimen. No side effects or adverse reaction reported to medication. She complains lower back pain; however pain medication provides functional improvement and pain relief.   Pharmacotherapy Assessment   Analgesic:  Oxycodone  HCl 10 mg tablets every 6 hours as needed for pain. MME=60 Monitoring: [place] PMP: PDMP reviewed during this encounter.       Pharmacotherapy: No side-effects or adverse reactions reported. Compliance: No problems identified. Effectiveness: Clinically acceptable.  Lennis Rabon, RN  07/05/2023 10:18 AM  Sign when Signing Visit Nursing Pain Medication Assessment:  Safety precautions to be maintained throughout the outpatient stay will include: orient to surroundings, keep bed in low position, maintain call bell within reach at all times, provide assistance with transfer out of bed and ambulation.  Medication Inspection Compliance: Pill count conducted under aseptic conditions, in front of the patient. Neither the pills nor the bottle was removed from the patient's sight at any time. Once count was completed pills were immediately returned to the patient in their original bottle.  Medication: oxycodone  Pill/Patch Count: 28 of 120 pills/patches remain Pill/Patch Appearance: Markings consistent with prescribed medication Bottle Appearance: Standard pharmacy container. Clearly labeled. Filled Date: 5 / 10 / 2025 Last Medication intake:  Today  No results found for: "CBDTHCR" No results found for: "D8THCCBX" No results found for: "D9THCCBX"  UDS:  Summary  Date Value Ref Range Status  07/13/2022 Note  Final    Comment:    ==================================================================== [employer] Select 13 (MW) ==================================================================== Test                             Result       Flag       Units  Drug Present and Declared for Prescription Verification   Oxycodone   1491         EXPECTED   ng/mg creat   Oxymorphone                    683          EXPECTED   ng/mg creat   Noroxycodone                   4491         EXPECTED   ng/mg creat   Noroxymorphone                 322          EXPECTED   ng/mg creat     Sources of oxycodone  are scheduled prescription medications.    Oxymorphone, noroxycodone, and noroxymorphone are expected    metabolites of oxycodone . Oxymorphone is also available as a    scheduled prescription medication.  ==================================================================== Test                      Result    Flag   Units      Ref Range   Creatinine              23               mg/dL      >=16 ==================================================================== Declared Medications:  The flagging and interpretation on this report are based on the  following declared medications.  Unexpected results may arise from  inaccuracies in the declared medications.   **Note: The testing scope of this panel includes these medications:   Oxycodone    **Note: The testing scope of this panel does not include the  following reported medications:   Biotin  Bupropion (Wellbutrin)  Calcium  Citalopram (Celexa)  Cyclobenzaprine  (Flexeril )  Epinephrine (EpiPen)  Famotidine (Pepcid)  Fluoride (Prevident)  Ibuprofen (Advil)  Levothyroxine (Synthroid)  Magnesium  Multivitamin  Naloxone  (Narcan )  Nifedipine (Procardia)  Potassium (Klor-Con)  Vitamin D   Vitamin D3  Zinc ==================================================================== For clinical consultation, please call [phone]. ====================================================================      ROS  Constitutional: Denies any fever or chills Gastrointestinal: No reported hemesis, hematochezia, vomiting, or acute GI distress Musculoskeletal: lower back pain  Neurological: No reported episodes of acute onset apraxia, aphasia, dysarthria, agnosia, amnesia, paralysis, loss of coordination, or loss of consciousness  Medication Review  Biotin, Calcium Carbonate-Vitamin D , EPINEPHrine, Magnesium, Multi-Vitamins, NIFEdipine, Oxycodone  HCl, Sodium Fluoride, Vitamin D3, Zinc, buPROPion, citalopram,  cyclobenzaprine , famotidine, hydroxychloroquine, ibuprofen, levothyroxine, naloxone , pantoprazole, and potassium chloride SA  History Review  Allergy: Holly Spears is allergic to bee venom, penicillins, shellfish allergy, sulfa antibiotics, erythromycin, zaleplon, codeine, and trazodone. Drug: Holly Spears  reports no history of drug use. Alcohol:  reports no history of alcohol use. Tobacco:  reports that she has quit smoking. She has never used smokeless tobacco. Social: Holly Spears  reports that she has quit smoking. She has never used smokeless tobacco. She reports that she does not drink alcohol and does not use drugs. Medical:  has a past medical history of Acid reflux (12/15/2011), Anxiety, generalized (12/15/2011), Arthritis, Cervical pain (12/15/2011), Depression, GERD (gastroesophageal reflux disease), Hiatal hernia, LBP (low back pain) (12/15/2011), Migraine, Muscle ache (12/15/2011), Osteopenia of left hip, Pain (12/27/2012), Rheumatoid arthritis (HCC) (12/15/2011), and Scratched cornea (10/25/2015). Surgical: Holly Spears  has a past surgical history that includes Cholecystectomy and Abdominal hysterectomy. Family: family history includes Depression in her mother; Diabetes in her father; Hyperlipidemia in her mother;  Hypertension in her father and mother.  Laboratory Chemistry Profile   Renal Lab Results  Component Value Date   BUN 6 03/03/2018   CREATININE 0.63 03/03/2018   BCR 10 03/03/2018   GFRAA 123 03/03/2018   GFRNONAA 106 03/03/2018    Hepatic Lab Results  Component Value Date   AST 21 03/03/2018   ALT 26 03/27/2015   ALBUMIN 4.6 03/03/2018   ALKPHOS 71 03/03/2018    Electrolytes Lab Results  Component Value Date   NA 136 03/03/2018   K 3.9 03/03/2018   CL 97 03/03/2018   CALCIUM 8.9 03/03/2018   MG 2.1 03/03/2018    Bone Lab Results  Component Value Date   VD25OH 29.3 (L) 12/31/2014    Inflammation (CRP: Acute Phase) (ESR: Chronic Phase) Lab Results   Component Value Date   CRP <1 03/03/2018   ESRSEDRATE 2 03/03/2018         Note: Above Lab results reviewed.  Recent Imaging Review  DG PAIN CLINIC C-ARM 1-60 MIN NO REPORT Fluoro was used, but no Radiologist interpretation will be provided.  Please refer to "NOTES" tab for provider progress note. Note: Reviewed         Physical Exam  General appearance: Well nourished, well developed, and well hydrated. In no apparent acute distress Mental status: Alert, oriented x 3 (person, place, & time)       Respiratory: No evidence of acute respiratory distress Eyes: PERLA Vitals: BP 121/69   Pulse 100   Temp (!) 97.3 F (36.3 C)   Resp 16   Ht 5\' 7"  (1.702 m)   Wt 178 lb (80.7 kg)   SpO2 100%   BMI 27.88 kg/m  BMI: Estimated body mass index is 27.88 kg/m as calculated from the following:   Height as of this encounter: 5\' 7"  (1.702 m).   Weight as of this encounter: 178 lb (80.7 kg). Ideal: Ideal body weight: 61.6 kg (135 lb 12.9 oz) Adjusted ideal body weight: 69.3 kg (152 lb 10.9 oz)  Assessment   Diagnosis Status  1. Chronic low back pain (1ry area of Pain) (Bilateral) (R>L) w/o sciatica   2. Chronic lower extremity pain (2ry area of Pain) (Bilateral)   3. Chronic knee pain (3ry area of Pain) (Bilateral) (R>L)   4. Chronic neck pain (4th area of Pain)   5. Chronic shoulder pain (5th area of Pain) (Bilateral) (R>L)   6. Failed back surgical syndrome   7. Chronic musculoskeletal pain   8. Arthropathy of shoulder (Right)   9. Chronic pain syndrome   10. Pharmacologic therapy   11. Chronic use of opiate for therapeutic purpose   12. Encounter for medication management   13. Encounter for chronic pain management    Controlled Controlled Controlled   Updated Problems: No problems updated.  Plan of Care  Problem-specific:  Assessment and Plan We will continue on current medication regimen. Prescribing Drug Monitoring (PDMP) reviewed; findings consistent with use of  prescribe medication and no evidence of narcotic misuse or abuse.  Routine UDS ordered today. No other new issues or problems reported to this visit. Schedule follow up in 90 days.   Holly Spears has a current medication list which includes the following long-term medication(s): bupropion, calcium carbonate-vitamin d , citalopram, cyclobenzaprine , levothyroxine, naloxone , nifedipine, pantoprazole, famotidine, [START ON 07/12/2023] oxycodone  hcl, [START ON 08/11/2023] oxycodone  hcl, and [START ON 09/10/2023] oxycodone  hcl.  Pharmacotherapy (Medications Ordered): Meds ordered this encounter  Medications   Oxycodone  HCl 10  MG TABS    Sig: Take 1 tablet (10 mg total) by mouth every 6 (six) hours as needed. Must last 30 days    Dispense:  120 tablet    Refill:  0    DO NOT: delete (not duplicate); no partial-fill (will deny script to complete), no refill request (F/U required). DISPENSE: 1 day early if closed on fill date. WARN: No CNS-depressants within 8 hrs of med.   Oxycodone  HCl 10 MG TABS    Sig: Take 1 tablet (10 mg total) by mouth every 6 (six) hours as needed. Must last 30 days    Dispense:  120 tablet    Refill:  0    DO NOT: delete (not duplicate); no partial-fill (will deny script to complete), no refill request (F/U required). DISPENSE: 1 day early if closed on fill date. WARN: No CNS-depressants within 8 hrs of med.   Oxycodone  HCl 10 MG TABS    Sig: Take 1 tablet (10 mg total) by mouth every 6 (six) hours as needed. Must last 30 days    Dispense:  120 tablet    Refill:  0    DO NOT: delete (not duplicate); no partial-fill (will deny script to complete), no refill request (F/U required). DISPENSE: 1 day early if closed on fill date. WARN: No CNS-depressants within 8 hrs of med.   Orders:  Orders Placed This Encounter  Procedures   Tox[employer] Select 13 (MW), Urine    Volume: 30 ml(s). Minimum 3 ml of urine is needed. Document temperature of fresh sample. Indications: Long term  (current) use of opiate analgesic (U98.119)    Release to patient:   Immediate        Return in about 3 months (around 10/05/2023) for (F2F), (MM), Marthe Slain NP.    Recent Visits Date Type Provider Dept  04/12/23 Office Visit Renaldo Caroli, MD [facility]  Showing recent visits within past 90 days and meeting all other requirements Today's Visits Date Type Provider Dept  07/05/23 Office Visit Beva Remund K, NP [facility]  Showing today's visits and meeting all other requirements Future Appointments No visits were found meeting these conditions. Showing future appointments within next 90 days and meeting all other requirements  I discussed the assessment and treatment plan with the patient. The patient was provided an opportunity to ask questions and all were answered. The patient agreed with the plan and demonstrated an understanding of the instructions.  Patient advised to call back or seek an in-person evaluation if the symptoms or condition worsens.  Duration of encounter: 30 minutes.  Total time on encounter, as per AMA guidelines included both the face-to-face and non-face-to-face time personally spent by the physician and/or other qualified health care professional(s) on the day of the encounter (includes time in activities that require the physician or other qualified health care professional and does not include time in activities normally performed by clinical staff). Physician's time may include the following activities when performed: Preparing to see the patient (e.g., pre-charting review of records, searching for previously ordered imaging, lab work, and nerve conduction tests) Review of prior analgesic pharmacotherapies. Reviewing PMP Interpreting ordered tests (e.g., lab work, imaging, nerve conduction tests) Performing post-procedure evaluations, including interpretation of diagnostic procedures Obtaining and/or reviewing separately obtained  history Performing a medically appropriate examination and/or evaluation Counseling and educating the patient/family/caregiver Ordering medications, tests, or procedures Referring and communicating with other health care professionals (when not separately reported) Documenting clinical information in the  electronic or other health record Independently interpreting results (not separately reported) and communicating results to the patient/ family/caregiver Care coordination (not separately reported)  Note by: Drequan Ironside K Mariane Burpee, NP (TTS and AI technology used. I apologize for any typographical errors that were not detected and corrected.) Date: 07/05/2023; Time: 10:55 AM

## 2023-07-05 NOTE — Patient Instructions (Signed)

## 2023-07-07 LAB — TOXASSURE SELECT 13 (MW), URINE

## 2023-10-05 ENCOUNTER — Ambulatory Visit: Attending: Nurse Practitioner | Admitting: Nurse Practitioner

## 2023-10-05 ENCOUNTER — Encounter: Payer: Self-pay | Admitting: Nurse Practitioner

## 2023-10-05 VITALS — BP 130/77 | HR 111 | Temp 97.2°F | Ht 66.0 in | Wt 180.0 lb

## 2023-10-05 DIAGNOSIS — M25562 Pain in left knee: Secondary | ICD-10-CM | POA: Insufficient documentation

## 2023-10-05 DIAGNOSIS — M542 Cervicalgia: Secondary | ICD-10-CM | POA: Diagnosis present

## 2023-10-05 DIAGNOSIS — Z79899 Other long term (current) drug therapy: Secondary | ICD-10-CM | POA: Diagnosis present

## 2023-10-05 DIAGNOSIS — G8929 Other chronic pain: Secondary | ICD-10-CM | POA: Diagnosis present

## 2023-10-05 DIAGNOSIS — M25512 Pain in left shoulder: Secondary | ICD-10-CM | POA: Diagnosis present

## 2023-10-05 DIAGNOSIS — M25511 Pain in right shoulder: Secondary | ICD-10-CM | POA: Diagnosis present

## 2023-10-05 DIAGNOSIS — M79605 Pain in left leg: Secondary | ICD-10-CM | POA: Insufficient documentation

## 2023-10-05 DIAGNOSIS — G894 Chronic pain syndrome: Secondary | ICD-10-CM | POA: Diagnosis not present

## 2023-10-05 DIAGNOSIS — Z79891 Long term (current) use of opiate analgesic: Secondary | ICD-10-CM | POA: Diagnosis present

## 2023-10-05 DIAGNOSIS — M961 Postlaminectomy syndrome, not elsewhere classified: Secondary | ICD-10-CM | POA: Insufficient documentation

## 2023-10-05 DIAGNOSIS — M25561 Pain in right knee: Secondary | ICD-10-CM | POA: Diagnosis present

## 2023-10-05 DIAGNOSIS — M79604 Pain in right leg: Secondary | ICD-10-CM | POA: Insufficient documentation

## 2023-10-05 DIAGNOSIS — M545 Low back pain, unspecified: Secondary | ICD-10-CM | POA: Diagnosis present

## 2023-10-05 DIAGNOSIS — M7918 Myalgia, other site: Secondary | ICD-10-CM | POA: Diagnosis present

## 2023-10-05 DIAGNOSIS — M19011 Primary osteoarthritis, right shoulder: Secondary | ICD-10-CM | POA: Diagnosis present

## 2023-10-05 MED ORDER — OXYCODONE HCL 10 MG PO TABS: 10.0000 mg | ORAL_TABLET | Freq: Four times a day (QID) | ORAL | 0 refills | Status: DC | PRN

## 2023-10-05 MED ORDER — NALOXONE HCL 4 MG/0.1ML NA LIQD: 1.0000 | NASAL | 0 refills | Status: AC | PRN

## 2023-10-05 NOTE — Progress Notes (Signed)
 PROVIDER NOTE: Interpretation of information contained herein should be left to medically-trained personnel. Specific patient instructions are provided elsewhere under Patient Instructions section of medical record. This document was created in part using AI and STT-dictation technology, any transcriptional errors that may result from this process are unintentional.  Patient: Holly Spears  Service: E/M   PCP: Teresa Therisa Carson, PA-C  DOB: 1970/04/18  DOS: 10/05/2023  Provider: Emmy MARLA Blanch, NP  MRN: [id number]  Delivery: Face-to-face  Specialty: Interventional Pain Management  Type: Established Patient  Setting: Ambulatory outpatient facility  Specialty designation: 09  Referring Prov.: Teresa Therisa Carson, PA-C  Location: Outpatient office facility       History of present illness (HPI) [name], a 53 y.o. year old female, is here today because of her Chronic bilateral low back pain without sciatica [M54.50, G89.29]. [name]'s primary complain today is Back Pain (Mid back)  Pertinent problems: 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   Pain Assessment: Severity of Chronic pain is reported as a 3 /10. Location: Back Mid/radiates down right leg to right heel. Onset: More than a month ago. Quality: Aching. Timing: Constant. Modifying factor(s): heat, meds. Vitals:  height is 5'  6 (1.676 m) and weight is 180 lb (81.6 kg). Her temperature is 97.2 F (36.2 C) (abnormal). Her blood pressure is 130/77 and her pulse is 111 (abnormal). Her oxygen saturation is 99%.  BMI: Estimated body mass index is 29.05 kg/m as calculated from the following:   Height as of this encounter: 5' 6 (1.676 m).   Weight as of this encounter: 180 lb (81.6 kg).  Last encounter: 07/05/2023. Last procedure: Visit date not found.  Reason for encounter: medication management. The patient indicates doing well with current medication regimen. No side effects or adverse reaction reported to medication. She complains lower back pain; however pain medication provides functional improvement and pain relief.  Pharmacotherapy Assessment   Analgesic: Oxycodone  HCl 10 mg tablets every 6 hours as needed for pain. MME=60 Monitoring: [place] PMP: PDMP reviewed during this encounter.       Pharmacotherapy: No side-effects or adverse reactions reported. Compliance: No problems identified. Effectiveness: Clinically acceptable.  Holly Nathanel PARAS, RN  10/05/2023  9:42 AM  Sign when Signing Visit Safety precautions to be maintained throughout the outpatient stay will include: orient to surroundings, keep bed in low position, maintain call bell within reach at all times, provide assistance with transfer out of bed and ambulation.   Nursing Pain Medication Assessment:  Safety precautions to be maintained throughout the outpatient stay will include: orient to surroundings, keep bed in low position, maintain call bell within reach at all times, provide assistance with transfer out of bed and ambulation.  Medication Inspection Compliance: Pill count conducted under aseptic conditions, in front of the patient. Neither the pills nor the bottle was removed from the patient's sight at any time. Once count was completed pills were immediately returned to the patient in their original bottle.  Medication:  Oxycodone  IR Pill/Patch  Count: 22 of 120 pills/patches remain Pill/Patch Appearance: Markings consistent with prescribed medication Bottle Appearance: Standard pharmacy container. Clearly labeled. Filled Date: 8 / 8 / 2025 Last Medication intake:  Today    UDS:  Summary  Date Value Ref Range Status  07/05/2023 FINAL  Final    Comment:    ==================================================================== [employer] [facility] 13 (MW) ==================================================================== Specimen Alert Note: Urinary creatinine is low; ability to detect some drugs may be compromised. Interpret results with caution. (Creatinine) ==================================================================== Test                             Result       Flag       Units  Drug Present and Declared for Prescription Verification   Oxycodone                       2718         EXPECTED   ng/mg creat   Oxymorphone                    300          EXPECTED   ng/mg creat   Noroxycodone                   5124         EXPECTED   ng/mg creat    Sources of oxycodone  include scheduled prescription medications.    Oxymorphone and noroxycodone are expected metabolites of oxycodone .    Oxymorphone is also available as a scheduled prescription medication.  ==================================================================== Test                      Result    Flag   Units      Ref Range   Creatinine              17        LL     mg/dL      >=79 ==================================================================== Declared Medications:  The flagging and interpretation on this report are based on the  following declared medications.  Unexpected results may arise from  inaccuracies in the declared medications.   **Note: The testing scope of this panel includes these medications:   Oxycodone    **Note: The testing scope of this panel does not include the  following reported medications:   Biotin  Bupropion (Wellbutrin)   Calcium  Citalopram (Celexa)  Cyclobenzaprine  (Flexeril )  Epinephrine (EpiPen)  Fluoride (Prevident)  Hydroxychloroquine (Plaquenil)  Ibuprofen (Advil)  Levothyroxine (Synthroid)  Magnesium  Multivitamin  Naloxone  (Narcan )  Nifedipine  Pantoprazole (Protonix)  Potassium (Klor-Con)  Vitamin D   Vitamin D3  Zinc ==================================================================== For clinical consultation, please call [phone]. ====================================================================     No results found for: CBDTHCR No results found for: D8THCCBX No results found for: D9THCCBX  ROS  Constitutional: Denies any fever or chills Gastrointestinal: No reported hemesis, hematochezia, vomiting, or acute GI distress Musculoskeletal: Denies any acute onset joint swelling, redness, loss of ROM, or weakness Neurological: No reported episodes of acute onset apraxia, aphasia, dysarthria, agnosia, amnesia, paralysis, loss of coordination, or loss of consciousness  Medication Review  Biotin, Calcium Carbonate-Vitamin D , EPINEPHrine, Magnesium, Multi-Vitamins, NIFEdipine, Oxycodone  HCl, Sodium Fluoride, Vitamin D3, Zinc, buPROPion, citalopram, cyclobenzaprine , hydroxychloroquine, ibuprofen, levothyroxine, naloxone , pantoprazole, and potassium chloride SA  History Review  Allergy: Ms. Rock is allergic to bee venom, penicillins, shellfish allergy, sulfa  antibiotics, erythromycin, zaleplon, codeine, and trazodone. Drug: Ms. Edling  reports no history of drug use. Alcohol:  reports no history of alcohol use. Tobacco:  reports that she has quit smoking. She has never used smokeless tobacco. Social: Ms. Craney  reports that she has quit smoking. She has never used smokeless tobacco. She reports that she does not drink alcohol and does not use drugs. Medical:  has a past medical history of Acid reflux (12/15/2011), Anxiety, generalized (12/15/2011), Arthritis, Cervical pain  (12/15/2011), Depression, GERD (gastroesophageal reflux disease), Hiatal hernia, LBP (low back pain) (12/15/2011), Migraine, Muscle ache (12/15/2011), Osteopenia of left hip, Pain (12/27/2012), Rheumatoid arthritis (HCC) (12/15/2011), and Scratched cornea (10/25/2015). Surgical: Ms. Hammerschmidt  has a past surgical history that includes Cholecystectomy and Abdominal hysterectomy. Family: family history includes Depression in her mother; Diabetes in her father; Hyperlipidemia in her mother; Hypertension in her father and mother.  Laboratory Chemistry Profile   Renal Lab Results  Component Value Date   BUN 6 03/03/2018   CREATININE 0.63 03/03/2018   BCR 10 03/03/2018   GFRAA 123 03/03/2018   GFRNONAA 106 03/03/2018    Hepatic Lab Results  Component Value Date   AST 21 03/03/2018   ALT 26 03/27/2015   ALBUMIN 4.6 03/03/2018   ALKPHOS 71 03/03/2018    Electrolytes Lab Results  Component Value Date   NA 136 03/03/2018   K 3.9 03/03/2018   CL 97 03/03/2018   CALCIUM 8.9 03/03/2018   MG 2.1 03/03/2018    Bone Lab Results  Component Value Date   VD25OH 29.3 (L) 12/31/2014    Inflammation (CRP: Acute Phase) (ESR: Chronic Phase) Lab Results  Component Value Date   CRP <1 03/03/2018   ESRSEDRATE 2 03/03/2018         Note: Above Lab results reviewed.  Recent Imaging Review  DG PAIN CLINIC C-ARM 1-60 MIN NO REPORT Fluoro was used, but no Radiologist interpretation will be provided.  Please refer to NOTES tab for provider progress note. Note: Reviewed        Physical Exam  Vitals: BP 130/77   Pulse (!) 111   Temp (!) 97.2 F (36.2 C)   Ht 5' 6 (1.676 m)   Wt 180 lb (81.6 kg)   SpO2 99%   BMI 29.05 kg/m  BMI: Estimated body mass index is 29.05 kg/m as calculated from the following:   Height as of this encounter: 5' 6 (1.676 m).   Weight as of this encounter: 180 lb (81.6 kg). Ideal: Ideal body weight: 59.3 kg (130 lb 11.7 oz) Adjusted ideal body weight: 68.2 kg (150  lb 7 oz) General appearance: Well nourished, well developed, and well hydrated. In no apparent acute distress Mental status: Alert, oriented x 3 (person, place, & time)       Respiratory: No evidence of acute respiratory distress Eyes: PERLA   Assessment   Diagnosis Status  1. Chronic low back pain (1ry area of Pain) (Bilateral) (R>L) w/o sciatica   2. Chronic lower extremity pain (2ry area of Pain) (Bilateral)   3. Chronic pain syndrome   4. Chronic knee pain (3ry area of Pain) (Bilateral) (R>L)   5. Chronic neck pain (4th area of Pain)   6. Failed back surgical syndrome   7. Chronic musculoskeletal pain   8. Arthropathy of shoulder (Right)   9. Pharmacologic therapy   10. Chronic shoulder pain (5th area of Pain) (Bilateral) (R>L)   11. Chronic use of opiate for therapeutic purpose  12. Encounter for medication management   13. Encounter for chronic pain management    Controlled Controlled Controlled   Updated Problems: No problems updated.  Plan of Care  Problem-specific:  Assessment and Plan We will continue on current medication regimen. Prescribing Drug Monitoring (PDMP) reviewed; findings consistent with use of prescribe medication and no evidence of narcotic misuse or abuse. Urine drug screening (UDS) up to date. No other new issues or problems reported to this visit. Schedule follow up in 90 days for medication management.    Ms. Donnalee Belshe has a current medication list which includes the following long-term medication(s): bupropion, calcium carbonate-vitamin d , citalopram, cyclobenzaprine , levothyroxine, naloxone , nifedipine, oxycodone  hcl, and pantoprazole.  Pharmacotherapy (Medications Ordered): No orders of the defined types were placed in this encounter.  Orders:  No orders of the defined types were placed in this encounter.       No follow-ups on file.    Recent Visits No visits were found meeting these conditions. Showing recent visits within past  90 days and meeting all other requirements Today's Visits Date Type Provider Dept  10/05/23 Office Visit Edison Nicholson K, NP [facility]  Showing today's visits and meeting all other requirements Future Appointments No visits were found meeting these conditions. Showing future appointments within next 90 days and meeting all other requirements  I discussed the assessment and treatment plan with the patient. The patient was provided an opportunity to ask questions and all were answered. The patient agreed with the plan and demonstrated an understanding of the instructions.  Patient advised to call back or seek an in-person evaluation if the symptoms or condition worsens.  Duration of encounter: 30 minutes.  Total time on encounter, as per AMA guidelines included both the face-to-face and non-face-to-face time personally spent by the physician and/or other qualified health care professional(s) on the day of the encounter (includes time in activities that require the physician or other qualified health care professional and does not include time in activities normally performed by clinical staff). Physician's time may include the following activities when performed: Preparing to see the patient (e.g., pre-charting review of records, searching for previously ordered imaging, lab work, and nerve conduction tests) Review of prior analgesic pharmacotherapies. Reviewing PMP Interpreting ordered tests (e.g., lab work, imaging, nerve conduction tests) Performing post-procedure evaluations, including interpretation of diagnostic procedures Obtaining and/or reviewing separately obtained history Performing a medically appropriate examination and/or evaluation Counseling and educating the patient/family/caregiver Ordering medications, tests, or procedures Referring and communicating with other health care professionals (when not separately reported) Documenting clinical information in the  electronic or other health record Independently interpreting results (not separately reported) and communicating results to the patient/ family/caregiver Care coordination (not separately reported)  Note by: Arvid Marengo K Reagen Haberman, NP (TTS and AI technology used. I apologize for any typographical errors that were not detected and corrected.) Date: 10/05/2023; Time: 9:57 AM

## 2023-10-05 NOTE — Progress Notes (Signed)
 Safety precautions to be maintained throughout the outpatient stay will include: orient to surroundings, keep bed in low position, maintain call bell within reach at all times, provide assistance with transfer out of bed and ambulation.   Nursing Pain Medication Assessment:  Safety precautions to be maintained throughout the outpatient stay will include: orient to surroundings, keep bed in low position, maintain call bell within reach at all times, provide assistance with transfer out of bed and ambulation.  Medication Inspection Compliance: Pill count conducted under aseptic conditions, in front of the patient. Neither the pills nor the bottle was removed from the patient's sight at any time. Once count was completed pills were immediately returned to the patient in their original bottle.  Medication: Oxycodone  IR Pill/Patch Count: 22 of 120 pills/patches remain Pill/Patch Appearance: Markings consistent with prescribed medication Bottle Appearance: Standard pharmacy container. Clearly labeled. Filled Date: 8 / 8 / 2025 Last Medication intake:  Today

## 2024-01-03 ENCOUNTER — Ambulatory Visit: Attending: Nurse Practitioner | Admitting: Nurse Practitioner

## 2024-01-03 ENCOUNTER — Encounter: Payer: Self-pay | Admitting: Nurse Practitioner

## 2024-01-03 DIAGNOSIS — M545 Low back pain, unspecified: Secondary | ICD-10-CM | POA: Insufficient documentation

## 2024-01-03 DIAGNOSIS — M79604 Pain in right leg: Secondary | ICD-10-CM | POA: Insufficient documentation

## 2024-01-03 DIAGNOSIS — Z79891 Long term (current) use of opiate analgesic: Secondary | ICD-10-CM | POA: Insufficient documentation

## 2024-01-03 DIAGNOSIS — M25561 Pain in right knee: Secondary | ICD-10-CM | POA: Insufficient documentation

## 2024-01-03 DIAGNOSIS — M19011 Primary osteoarthritis, right shoulder: Secondary | ICD-10-CM

## 2024-01-03 DIAGNOSIS — M79605 Pain in left leg: Secondary | ICD-10-CM | POA: Insufficient documentation

## 2024-01-03 DIAGNOSIS — M961 Postlaminectomy syndrome, not elsewhere classified: Secondary | ICD-10-CM | POA: Diagnosis present

## 2024-01-03 DIAGNOSIS — G8929 Other chronic pain: Secondary | ICD-10-CM | POA: Insufficient documentation

## 2024-01-03 DIAGNOSIS — Z79899 Other long term (current) drug therapy: Secondary | ICD-10-CM | POA: Insufficient documentation

## 2024-01-03 DIAGNOSIS — G894 Chronic pain syndrome: Secondary | ICD-10-CM | POA: Diagnosis present

## 2024-01-03 DIAGNOSIS — M542 Cervicalgia: Secondary | ICD-10-CM | POA: Diagnosis present

## 2024-01-03 DIAGNOSIS — M7918 Myalgia, other site: Secondary | ICD-10-CM | POA: Diagnosis present

## 2024-01-03 DIAGNOSIS — M25562 Pain in left knee: Secondary | ICD-10-CM | POA: Diagnosis present

## 2024-01-03 MED ORDER — OXYCODONE HCL 10 MG PO TABS: 10.0000 mg | ORAL_TABLET | Freq: Four times a day (QID) | ORAL | 0 refills | Status: AC | PRN

## 2024-01-03 NOTE — Progress Notes (Signed)
 PROVIDER NOTE: Interpretation of information contained herein should be left to medically-trained personnel. Specific patient instructions are provided elsewhere under Patient Instructions section of medical record. This document was created in part using AI and STT-dictation technology, any transcriptional errors that may result from this process are unintentional.  Patient: Holly Spears  Service: E/M   PCP: Teresa Therisa Carson, PA-C  DOB: 18-Apr-1970  DOS: 01/03/2024  Provider: Emmy MARLA Blanch, NP  MRN: [id number]  Delivery: Face-to-face  Specialty: Interventional Pain Management  Type: Established Patient  Setting: Ambulatory outpatient facility  Specialty designation: 09  Referring Prov.: Teresa Therisa Carson, PA-C  Location: Outpatient office facility       History of present illness (HPI) [name], a 53 y.o. year old female, is here today because of her mid back pain. Holly Spears's primary complain today is Back Pain (Mid Back )  Pertinent problems: Holly Spears has Long term current use of opiate analgesic; Long term prescription opiate use; Opiate use (60 MME/Day); Opiate dependence (HCC); Muscle spasm; Myofascial pain; Rheumatoid arthritis with positive rheumatoid factor (HCC); Chronic musculoskeletal pain; Chronic pain syndrome; Chronic neck pain (4th area of Pain); Cervical spondylosis (C5-6 and C6-7); Cervical disc herniation (C6-7) (Left); Bulge of cervical disc without myelopathy (C5-6); Cervicogenic headache; Chronic cervical radicular pain (Bilateral) (R>L); Chronic shoulder pain (5th area of Pain) (Bilateral) (R>L); Chronic low back pain (1ry area of Pain) (Bilateral) (R>L) w/o sciatica; Failed back surgical syndrome; 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; Lumbar spondylosis (L3-4, L4-5, and L5-S1); Lumbar bulging disc (L3-4, L4-5, and L5-S1); Chronic lumbar radicular pain (Bilateral) (R>L); Chronic knee pain (3ry area of Pain) (Bilateral) (R>L); Chronic upper back pain (Left); Thoracic disc herniation  (Large, Left paracentral T10-11 disc herniation); Thoracic spinal stenosis (Severe, Left, T10-11 Lateral Recess Stenosis); 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  Pain Assessment: Severity of Chronic pain is reported as a 2 /10. Location: Back Mid/Right hip down right leg to heel. Onset: More than a month ago. Quality: Aching. Timing: Constant. Modifying factor(s): Heat. Vitals:  height is 5' 6 (1.676 m) and weight is 180 lb (81.6 kg). Her temporal temperature is 97.2 F (36.2 C) (abnormal). Her blood pressure is 152/94 (abnormal) and her pulse is 102 (abnormal). Her respiration is 18 and oxygen saturation is 100%.  BMI: Estimated body mass index is 29.05 kg/m as calculated from the following:   Height as of this encounter: 5' 6 (1.676 m).   Weight as of this encounter: 180 lb (81.6 kg).  Last encounter: 10/05/2023. Last procedure: Visit date not found.  Reason for encounter: medication management.   Discussed the use of AI scribe software for clinical note transcription with the patient, who gave verbal consent to proceed.  History of Present Illness   Holly Spears is a 53 year old female who presents for follow-up of pain management related to her knee, back, and hand burns.  She continues to experience pain in her knee, back, and hand burns. She has a history of carpal tunnel syndrome in both hands and underwent a procedure in 2023 for nerve issues in her right hand, which she describes as successful.  She is currently taking oxycodone  for pain management and has not reported any side effects or adverse reactions.      Pharmacotherapy Assessment   Analgesic: Oxycodone  HCl  10 mg tablets every 6 hours as needed for pain.  MME=60 Monitoring: Holly Spears PMP: PDMP reviewed during this encounter.       Pharmacotherapy: No side-effects or adverse reactions reported. Compliance: No problems identified. Effectiveness: Clinically acceptable.  Holly Spears, [place]  01/03/2024  9:25 AM  Sign when Signing Visit Nursing Pain Medication Assessment:  Safety precautions to be maintained throughout the outpatient stay will include: orient to surroundings, keep bed in low position, maintain call bell within reach at all times, provide assistance with transfer out of bed and ambulation.  Medication Inspection Compliance: Pill count conducted under aseptic conditions, in front of the patient. Neither the pills nor the bottle was removed from the patient's sight at any time. Once count was completed pills were immediately returned to the patient in their original bottle.  Medication: Oxycodone  IR Pill/Patch Count: 22 of 120 pills/patches remain Pill/Patch Appearance: Markings consistent with prescribed medication Bottle Appearance: Standard pharmacy container. Clearly labeled. Filled Date: 3 / 06 / 2025 Last Medication intake:  Today    UDS:  Summary  Date Value Ref Range Status  07/05/2023 FINAL  Final    Comment:    ==================================================================== [employer] Select 13 (MW) ==================================================================== Specimen Alert Note: Urinary creatinine is low; ability to detect some drugs may be compromised. Interpret results with caution. (Creatinine) ==================================================================== Test                             Result       Flag       Units  Drug Present and Declared for Prescription Verification   Oxycodone                       2718         EXPECTED   ng/mg creat   Oxymorphone                    300          EXPECTED   ng/mg creat   Noroxycodone                   5124         EXPECTED   ng/mg creat    Sources of  oxycodone  include scheduled prescription medications.    Oxymorphone and noroxycodone are expected metabolites of oxycodone .    Oxymorphone is also available as a scheduled prescription medication.  ==================================================================== Test                      Result    Flag   Units      Ref Range   Creatinine              17        LL     mg/dL      >=79 ==================================================================== Declared Medications:  The flagging and interpretation on this report are based on the  following declared medications.  Unexpected results may arise from  inaccuracies in the declared medications.   **Note: The testing scope of this panel includes these medications:   Oxycodone    **Note: The testing scope of this panel does not include the  following reported medications:   Biotin  Bupropion (Wellbutrin)  Calcium  Citalopram (Celexa)  Cyclobenzaprine  (Flexeril )  Epinephrine (EpiPen)  Fluoride (Prevident)  Hydroxychloroquine (Plaquenil)  Ibuprofen (Advil)  Levothyroxine (Synthroid)  Magnesium  Multivitamin  Naloxone  (Narcan )  Nifedipine  Pantoprazole (Protonix)  Potassium (Klor-Con)  Vitamin D   Vitamin D3  Zinc ==================================================================== For clinical consultation, please call [phone]. ====================================================================     No results found for: CBDTHCR No results found for: D8THCCBX No results found for: D9THCCBX  ROS  Constitutional: Denies any fever or chills Gastrointestinal: No reported hemesis, hematochezia, vomiting, or acute GI distress Musculoskeletal: Mid back pain Neurological: No reported episodes of acute onset apraxia, aphasia, dysarthria, agnosia, amnesia, paralysis, loss of coordination, or loss of consciousness  Medication Review  Biotin, Calcium Carbonate-Vitamin D , EPINEPHrine, Magnesium, Multi-Vitamins,  NIFEdipine, Oxycodone  HCl, Sodium Fluoride, Vitamin D3, Zinc, buPROPion, citalopram, cyclobenzaprine , hydroxychloroquine, ibuprofen, levothyroxine, naloxone , pantoprazole, and potassium chloride SA  History Review  Allergy: Holly Spears is allergic to bee venom, penicillins, shellfish allergy, sulfa antibiotics, erythromycin, zaleplon, codeine, and trazodone. Drug: Holly Spears  reports no history of drug use. Alcohol:  reports no history of alcohol use. Tobacco:  reports that she has quit smoking. She has never used smokeless tobacco. Social: Holly Spears  reports that she has quit smoking. She has never used smokeless tobacco. She reports that she does not drink alcohol and does not use drugs. Medical:  has a past medical history of Acid reflux (12/15/2011), Anxiety, generalized (12/15/2011), Arthritis, Cervical pain (12/15/2011), Depression, GERD (gastroesophageal reflux disease), Hiatal hernia, LBP (low back pain) (12/15/2011), Migraine, Muscle ache (12/15/2011), Osteopenia of left hip, Pain (12/27/2012), Rheumatoid arthritis (HCC) (12/15/2011), and Scratched cornea (10/25/2015). Surgical: Holly Spears  has a past surgical history that includes Cholecystectomy and Abdominal hysterectomy. Family: family history includes Depression in her mother; Diabetes in her father; Hyperlipidemia in her mother; Hypertension in her father and mother.  Laboratory Chemistry Profile   Renal Lab Results  Component Value Date   BUN 6 03/03/2018   CREATININE 0.63 03/03/2018   BCR 10 03/03/2018   GFRAA 123 03/03/2018   GFRNONAA 106 03/03/2018    Hepatic Lab Results  Component Value Date   AST 21 03/03/2018   ALT 26 03/27/2015   ALBUMIN 4.6 03/03/2018   ALKPHOS 71 03/03/2018    Electrolytes Lab Results  Component Value Date   NA 136 03/03/2018   K 3.9 03/03/2018   CL 97 03/03/2018   CALCIUM 8.9 03/03/2018   MG 2.1 03/03/2018    Bone Lab Results  Component Value Date   VD25OH 29.3 (L) 12/31/2014     Inflammation (CRP: Acute Phase) (ESR: Chronic Phase) Lab Results  Component Value Date   CRP <1 03/03/2018   ESRSEDRATE 2 03/03/2018         Note: Above Lab results reviewed.  Recent Imaging Review  DG PAIN CLINIC C-ARM 1-60 MIN NO REPORT Fluoro was used, but no Radiologist interpretation will be provided.  Please refer to NOTES tab for provider progress note. Note: Reviewed        Physical Exam  Vitals: BP (!) 152/94 (BP Location: Right Arm, Patient Position: Sitting, Cuff Size: Normal)   Pulse (!) 102   Temp (!) 97.2 F (36.2 C) (Temporal)   Resp 18   Ht 5' 6 (1.676 m)   Wt 180 lb (81.6 kg)   SpO2 100%   BMI 29.05 kg/m  BMI: Estimated body mass index is 29.05 kg/m as calculated from the following:   Height as of this encounter: 5' 6 (1.676 m).   Weight as of this encounter: 180 lb (81.6 kg). Ideal: Ideal body weight: 59.3 kg (130 lb 11.7 oz) Adjusted ideal body weight: 68.2 kg (150 lb 7  oz) General appearance: Well nourished, well developed, and well hydrated. In no apparent acute distress Mental status: Alert, oriented x 3 (person, place, & time)       Respiratory: No evidence of acute respiratory distress Eyes: PERLA  Musculoskeletal: + Mid back pain Assessment   Diagnosis Status  1. Chronic low back pain (1ry area of Pain) (Bilateral) (R>L) w/o sciatica   2. Chronic use of opiate for therapeutic purpose   3. Chronic pain syndrome   4. Pharmacologic therapy   5. Chronic lower extremity pain (2ry area of Pain) (Bilateral)   6. Chronic knee pain (3ry area of Pain) (Bilateral) (R>L)   7. Chronic neck pain (4th area of Pain)   8. Failed back surgical syndrome   9. Chronic musculoskeletal pain    Controlled Controlled Controlled   Updated Problems: No problems updated.  Plan of Care  Problem-specific:  Assessment and Plan    Chronic pain syndrome with multifocal musculoskeletal pain (low back, right shoulder, right knee, right leg) Chronic pain  syndrome affecting multiple sites. Previous radiofrequency ablation for right shoulder pain was effective. Oxycodone  used for pain control. - Continue oxycodone  for pain management. - Advised on Miralax or increased water intake to prevent opioid-induced constipation.  Opioid therapy management and monitoring for chronic pain - Advised on Miralax or increased water intake to prevent constipation.   Chronic use of opioid for therapeutic purpose: Patient's pain is controlled with oxycodone , will continue on current medication regimen.  Prescribing drug monitoring (PDMP) reviewed, findings consistent with the use of prescribed medication and no evidence of narcotic misuse or abuse.  Urine drug screening (UDS) up to date.  The patient was advised to use over-the-counter MiraLAX or increase water intake to prevent opioid induced constipation.  No side effects or adverse reaction reported to medication.  Schedule follow-up in 90 days for medication management.      Ms. Keshauna Genna has a current medication list which includes the following long-term medication(s): bupropion, calcium carbonate-vitamin d , citalopram, cyclobenzaprine , levothyroxine, naloxone , nifedipine, pantoprazole, [START ON 01/08/2024] oxycodone  hcl, [START ON 02/07/2024] oxycodone  hcl, and [START ON 03/08/2024] oxycodone  hcl.  Pharmacotherapy (Medications Ordered): Meds ordered this encounter  Medications   Oxycodone  HCl 10 MG TABS    Sig: Take 1 tablet (10 mg total) by mouth every 6 (six) hours as needed. Must last 30 days    Dispense:  120 tablet    Refill:  0    DO NOT: delete (not duplicate); no partial-fill (will deny script to complete), no refill request (F/U required). DISPENSE: 1 day early if closed on fill date. WARN: No CNS-depressants within 8 hrs of med.   Oxycodone  HCl 10 MG TABS    Sig: Take 1 tablet (10 mg total) by mouth every 6 (six) hours as needed. Must last 30 days    Dispense:  120 tablet    Refill:  0    DO  NOT: delete (not duplicate); no partial-fill (will deny script to complete), no refill request (F/U required). DISPENSE: 1 day early if closed on fill date. WARN: No CNS-depressants within 8 hrs of med.   Oxycodone  HCl 10 MG TABS    Sig: Take 1 tablet (10 mg total) by mouth every 6 (six) hours as needed. Must last 30 days    Dispense:  120 tablet    Refill:  0    DO NOT: delete (not duplicate); no partial-fill (will deny script to complete), no refill request (F/U required). DISPENSE: 1 day  early if closed on fill date. WARN: No CNS-depressants within 8 hrs of med.   Orders:  No orders of the defined types were placed in this encounter.     Return in about 3 months (around 04/02/2024) for (F2F), (MM), Emmy Blanch NP.    Recent Visits Date Type Provider Dept  10/05/23 Office Visit Lashana Spang K, NP [facility]  Showing recent visits within past 90 days and meeting all other requirements Today's Visits Date Type Provider Dept  01/03/24 Office Visit Shadavia Dampier K, NP [facility]  Showing today's visits and meeting all other requirements Future Appointments Date Type Provider Dept  03/30/24 Appointment Lyndzee Kliebert K, NP [facility]  Showing future appointments within next 90 days and meeting all other requirements  I discussed the assessment and treatment plan with the patient. The patient was provided an opportunity to ask questions and all were answered. The patient agreed with the plan and demonstrated an understanding of the instructions.  Patient advised to call back or seek an in-person evaluation if the symptoms or condition worsens.  I personally spent a total of 30 minutes in the care of the patient today including preparing to see the patient, getting/reviewing separately obtained history, performing a medically appropriate exam/evaluation, counseling and educating, placing orders, referring and communicating with other health care professionals,  documenting clinical information in the EHR, independently interpreting results, communicating results, and coordinating care.  Note by: Rahi Chandonnet K Tamyrah Burbage, NP (TTS and AI technology used. I apologize for any typographical errors that were not detected and corrected.) Date: 01/03/2024; Time: 10:15 AM

## 2024-01-03 NOTE — Progress Notes (Signed)
 Nursing Pain Medication Assessment:  Safety precautions to be maintained throughout the outpatient stay will include: orient to surroundings, keep bed in low position, maintain call bell within reach at all times, provide assistance with transfer out of bed and ambulation.  Medication Inspection Compliance: Pill count conducted under aseptic conditions, in front of the patient. Neither the pills nor the bottle was removed from the patient's sight at any time. Once count was completed pills were immediately returned to the patient in their original bottle.  Medication: Oxycodone  IR Pill/Patch Count: 22 of 120 pills/patches remain Pill/Patch Appearance: Markings consistent with prescribed medication Bottle Appearance: Standard pharmacy container. Clearly labeled. Filled Date: 32 / 06 / 2025 Last Medication intake:  Today

## 2024-03-30 ENCOUNTER — Encounter: Admitting: Nurse Practitioner
